# Patient Record
Sex: Male | Born: 1955 | Race: White | Hispanic: No | Marital: Married | Smoking: Former smoker
Health system: Southern US, Community
[De-identification: ages and names within clinical notes are randomized; demographics above are authoritative.]

## PROBLEM LIST (undated history)

## (undated) DIAGNOSIS — F17200 Nicotine dependence, unspecified, uncomplicated: Secondary | ICD-10-CM

## (undated) DIAGNOSIS — Z860100 Personal history of colon polyps, unspecified: Secondary | ICD-10-CM

## (undated) DIAGNOSIS — R252 Cramp and spasm: Secondary | ICD-10-CM

## (undated) DIAGNOSIS — Z6832 Body mass index (BMI) 32.0-32.9, adult: Secondary | ICD-10-CM

## (undated) DIAGNOSIS — I129 Hypertensive chronic kidney disease with stage 1 through stage 4 chronic kidney disease, or unspecified chronic kidney disease: Secondary | ICD-10-CM

## (undated) DIAGNOSIS — I700 Atherosclerosis of aorta: Secondary | ICD-10-CM

## (undated) DIAGNOSIS — Z8709 Personal history of other diseases of the respiratory system: Secondary | ICD-10-CM

## (undated) DIAGNOSIS — M5416 Radiculopathy, lumbar region: Secondary | ICD-10-CM

## (undated) DIAGNOSIS — M51369 Other intervertebral disc degeneration, lumbar region without mention of lumbar back pain or lower extremity pain: Secondary | ICD-10-CM

## (undated) DIAGNOSIS — F419 Anxiety disorder, unspecified: Secondary | ICD-10-CM

## (undated) DIAGNOSIS — M5431 Sciatica, right side: Secondary | ICD-10-CM

## (undated) DIAGNOSIS — G8929 Other chronic pain: Secondary | ICD-10-CM

## (undated) DIAGNOSIS — J189 Pneumonia, unspecified organism: Secondary | ICD-10-CM

## (undated) DIAGNOSIS — G4700 Insomnia, unspecified: Secondary | ICD-10-CM

## (undated) DIAGNOSIS — R7303 Prediabetes: Secondary | ICD-10-CM

## (undated) DIAGNOSIS — K219 Gastro-esophageal reflux disease without esophagitis: Secondary | ICD-10-CM

## (undated) DIAGNOSIS — E7800 Pure hypercholesterolemia, unspecified: Secondary | ICD-10-CM

## (undated) DIAGNOSIS — D582 Other hemoglobinopathies: Secondary | ICD-10-CM

## (undated) DIAGNOSIS — E875 Hyperkalemia: Secondary | ICD-10-CM

## (undated) DIAGNOSIS — M549 Dorsalgia, unspecified: Secondary | ICD-10-CM

## (undated) DIAGNOSIS — R519 Headache, unspecified: Secondary | ICD-10-CM

## (undated) DIAGNOSIS — M542 Cervicalgia: Secondary | ICD-10-CM

## (undated) DIAGNOSIS — A4902 Methicillin resistant Staphylococcus aureus infection, unspecified site: Secondary | ICD-10-CM

## (undated) DIAGNOSIS — G5793 Unspecified mononeuropathy of bilateral lower limbs: Secondary | ICD-10-CM

## (undated) DIAGNOSIS — M5136 Other intervertebral disc degeneration, lumbar region: Secondary | ICD-10-CM

## (undated) DIAGNOSIS — D649 Anemia, unspecified: Secondary | ICD-10-CM

## (undated) DIAGNOSIS — M797 Fibromyalgia: Secondary | ICD-10-CM

## (undated) DIAGNOSIS — K5790 Diverticulosis of intestine, part unspecified, without perforation or abscess without bleeding: Secondary | ICD-10-CM

## (undated) DIAGNOSIS — J309 Allergic rhinitis, unspecified: Secondary | ICD-10-CM

## (undated) DIAGNOSIS — R200 Anesthesia of skin: Secondary | ICD-10-CM

## (undated) DIAGNOSIS — C439 Malignant melanoma of skin, unspecified: Secondary | ICD-10-CM

## (undated) DIAGNOSIS — G4730 Sleep apnea, unspecified: Secondary | ICD-10-CM

## (undated) DIAGNOSIS — I7300 Raynaud's syndrome without gangrene: Secondary | ICD-10-CM

## (undated) DIAGNOSIS — M5030 Other cervical disc degeneration, unspecified cervical region: Secondary | ICD-10-CM

## (undated) DIAGNOSIS — D751 Secondary polycythemia: Secondary | ICD-10-CM

## (undated) DIAGNOSIS — I10 Essential (primary) hypertension: Secondary | ICD-10-CM

## (undated) DIAGNOSIS — U071 COVID-19: Secondary | ICD-10-CM

## (undated) DIAGNOSIS — I739 Peripheral vascular disease, unspecified: Secondary | ICD-10-CM

## (undated) DIAGNOSIS — M545 Low back pain, unspecified: Secondary | ICD-10-CM

## (undated) DIAGNOSIS — M1990 Unspecified osteoarthritis, unspecified site: Secondary | ICD-10-CM

## (undated) DIAGNOSIS — J329 Chronic sinusitis, unspecified: Secondary | ICD-10-CM

## (undated) DIAGNOSIS — M109 Gout, unspecified: Secondary | ICD-10-CM

## (undated) DIAGNOSIS — R7989 Other specified abnormal findings of blood chemistry: Secondary | ICD-10-CM

## (undated) HISTORY — PX: APPENDECTOMY: SHX54

## (undated) HISTORY — PX: HAND TENDON SURGERY: SHX663

## (undated) HISTORY — DX: Atherosclerosis of aorta: I70.0

## (undated) HISTORY — DX: Hyperkalemia: E87.5

## (undated) HISTORY — DX: Cervicalgia: M54.2

## (undated) HISTORY — PX: BACK SURGERY: SHX140

## (undated) HISTORY — PX: EYE SURGERY: SHX253

## (undated) HISTORY — DX: Sciatica, right side: M54.31

## (undated) HISTORY — PX: CARPAL TUNNEL RELEASE: SHX101

## (undated) HISTORY — PX: UPPER GI ENDOSCOPY: SHX6162

## (undated) HISTORY — DX: Low back pain, unspecified: M54.50

## (undated) HISTORY — DX: Nicotine dependence, unspecified, uncomplicated: F17.200

## (undated) HISTORY — DX: Allergic rhinitis, unspecified: J30.9

## (undated) HISTORY — DX: Body mass index (BMI) 32.0-32.9, adult: Z68.32

## (undated) HISTORY — PX: ANTERIOR CERVICAL DECOMP/DISCECTOMY FUSION: SHX1161

## (undated) HISTORY — DX: Raynaud's syndrome without gangrene: I73.00

## (undated) HISTORY — PX: OTHER SURGICAL HISTORY: SHX169

## (undated) HISTORY — PX: TONSILLECTOMY: SUR1361

## (undated) HISTORY — DX: Insomnia, unspecified: G47.00

## (undated) HISTORY — DX: Other hemoglobinopathies: D58.2

## (undated) HISTORY — DX: Other specified abnormal findings of blood chemistry: R79.89

## (undated) HISTORY — DX: Hypertensive chronic kidney disease with stage 1 through stage 4 chronic kidney disease, or unspecified chronic kidney disease: I12.9

## (undated) HISTORY — DX: Chronic sinusitis, unspecified: J32.9

## (undated) HISTORY — PX: CATARACT EXTRACTION, BILATERAL: SHX1313

## (undated) HISTORY — DX: COVID-19: U07.1

## (undated) HISTORY — PX: COLONOSCOPY: SHX174

## (undated) HISTORY — PX: SHOULDER ARTHROSCOPY: SHX128

## (undated) HISTORY — PX: INGUINAL HERNIA REPAIR: SUR1180

## (undated) HISTORY — DX: Personal history of colon polyps, unspecified: Z86.0100

## (undated) HISTORY — DX: Secondary polycythemia: D75.1

## (undated) HISTORY — PX: WISDOM TOOTH EXTRACTION: SHX21

## (undated) HISTORY — PX: KNEE ARTHROSCOPY: SHX127

## (undated) HISTORY — PX: JOINT REPLACEMENT: SHX530

## (undated) HISTORY — DX: Personal history of colonic polyps: Z86.010

## (undated) HISTORY — DX: Diverticulosis of intestine, part unspecified, without perforation or abscess without bleeding: K57.90

---
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
## 1986-02-27 HISTORY — PX: VASECTOMY: SHX75

## 1988-10-28 HISTORY — PX: KNEE ARTHROSCOPY: SHX127

## 1988-10-28 HISTORY — PX: CARPAL TUNNEL RELEASE: SHX101

## 1995-11-28 HISTORY — PX: POSTERIOR LUMBAR FUSION: SHX6036

## 1997-06-05 ENCOUNTER — Inpatient Hospital Stay (HOSPITAL_COMMUNITY): Admission: RE | Admit: 1997-06-05 | Discharge: 1997-06-05 | Payer: Self-pay | Admitting: Neurosurgery

## 1997-07-30 ENCOUNTER — Ambulatory Visit (HOSPITAL_COMMUNITY): Admission: RE | Admit: 1997-07-30 | Discharge: 1997-07-30 | Payer: Self-pay | Admitting: Neurosurgery

## 1998-06-30 ENCOUNTER — Ambulatory Visit (HOSPITAL_COMMUNITY): Admission: RE | Admit: 1998-06-30 | Discharge: 1998-06-30 | Payer: Self-pay | Admitting: Neurosurgery

## 1998-06-30 ENCOUNTER — Encounter: Payer: Self-pay | Admitting: Neurosurgery

## 1998-08-23 ENCOUNTER — Ambulatory Visit (HOSPITAL_COMMUNITY): Admission: RE | Admit: 1998-08-23 | Discharge: 1998-08-23 | Payer: Self-pay | Admitting: Neurosurgery

## 1998-08-23 ENCOUNTER — Encounter: Payer: Self-pay | Admitting: Neurosurgery

## 1998-10-29 HISTORY — PX: WISDOM TOOTH EXTRACTION: SHX21

## 1998-12-09 ENCOUNTER — Encounter: Payer: Self-pay | Admitting: Neurosurgery

## 1998-12-15 ENCOUNTER — Ambulatory Visit (HOSPITAL_COMMUNITY): Admission: RE | Admit: 1998-12-15 | Discharge: 1998-12-15 | Payer: Self-pay | Admitting: Neurosurgery

## 1998-12-15 ENCOUNTER — Encounter: Payer: Self-pay | Admitting: Neurosurgery

## 1999-02-09 ENCOUNTER — Ambulatory Visit (HOSPITAL_COMMUNITY): Admission: RE | Admit: 1999-02-09 | Discharge: 1999-02-09 | Payer: Self-pay | Admitting: Neurosurgery

## 1999-02-09 ENCOUNTER — Encounter: Payer: Self-pay | Admitting: Neurosurgery

## 1999-06-17 ENCOUNTER — Encounter: Payer: Self-pay | Admitting: Neurosurgery

## 1999-06-17 ENCOUNTER — Ambulatory Visit (HOSPITAL_COMMUNITY): Admission: RE | Admit: 1999-06-17 | Discharge: 1999-06-17 | Payer: Self-pay | Admitting: Neurosurgery

## 1999-06-28 ENCOUNTER — Encounter: Payer: Self-pay | Admitting: Neurosurgery

## 1999-06-28 ENCOUNTER — Ambulatory Visit (HOSPITAL_COMMUNITY): Admission: RE | Admit: 1999-06-28 | Discharge: 1999-06-28 | Payer: Self-pay | Admitting: Neurosurgery

## 1999-07-22 ENCOUNTER — Encounter: Admission: RE | Admit: 1999-07-22 | Discharge: 1999-09-12 | Payer: Self-pay | Admitting: Anesthesiology

## 1999-10-26 ENCOUNTER — Encounter: Admission: RE | Admit: 1999-10-26 | Discharge: 2000-01-24 | Payer: Self-pay | Admitting: Anesthesiology

## 1999-11-03 ENCOUNTER — Encounter: Admission: RE | Admit: 1999-11-03 | Discharge: 1999-11-04 | Payer: Self-pay | Admitting: Anesthesiology

## 2000-01-27 ENCOUNTER — Encounter: Admission: RE | Admit: 2000-01-27 | Discharge: 2000-04-26 | Payer: Self-pay | Admitting: Anesthesiology

## 2000-06-22 ENCOUNTER — Encounter: Admission: RE | Admit: 2000-06-22 | Discharge: 2000-09-20 | Payer: Self-pay | Admitting: Anesthesiology

## 2000-10-12 ENCOUNTER — Encounter: Admission: RE | Admit: 2000-10-12 | Discharge: 2000-10-27 | Payer: Self-pay | Admitting: Anesthesiology

## 2000-10-16 ENCOUNTER — Ambulatory Visit (HOSPITAL_COMMUNITY): Admission: RE | Admit: 2000-10-16 | Discharge: 2000-10-16 | Payer: Self-pay | Admitting: Neurosurgery

## 2000-10-16 ENCOUNTER — Encounter: Payer: Self-pay | Admitting: Neurosurgery

## 2001-07-30 ENCOUNTER — Encounter: Admission: RE | Admit: 2001-07-30 | Discharge: 2001-10-28 | Payer: Self-pay

## 2001-07-31 ENCOUNTER — Encounter: Payer: Self-pay | Admitting: Family Medicine

## 2001-07-31 ENCOUNTER — Ambulatory Visit (HOSPITAL_COMMUNITY): Admission: RE | Admit: 2001-07-31 | Discharge: 2001-07-31 | Payer: Self-pay | Admitting: Family Medicine

## 2001-12-06 ENCOUNTER — Encounter: Payer: Self-pay | Admitting: Neurosurgery

## 2001-12-11 ENCOUNTER — Inpatient Hospital Stay (HOSPITAL_COMMUNITY): Admission: RE | Admit: 2001-12-11 | Discharge: 2001-12-12 | Payer: Self-pay | Admitting: Neurosurgery

## 2001-12-11 ENCOUNTER — Encounter: Payer: Self-pay | Admitting: Neurosurgery

## 2002-02-05 ENCOUNTER — Encounter: Payer: Self-pay | Admitting: Neurosurgery

## 2002-02-05 ENCOUNTER — Encounter: Admission: RE | Admit: 2002-02-05 | Discharge: 2002-02-05 | Payer: Self-pay | Admitting: Neurosurgery

## 2002-04-30 ENCOUNTER — Encounter: Payer: Self-pay | Admitting: Neurosurgery

## 2002-04-30 ENCOUNTER — Encounter: Admission: RE | Admit: 2002-04-30 | Discharge: 2002-04-30 | Payer: Self-pay | Admitting: Neurosurgery

## 2002-06-04 ENCOUNTER — Ambulatory Visit (HOSPITAL_COMMUNITY): Admission: RE | Admit: 2002-06-04 | Discharge: 2002-06-04 | Payer: Self-pay | Admitting: Family Medicine

## 2002-06-04 ENCOUNTER — Encounter: Payer: Self-pay | Admitting: Family Medicine

## 2002-08-21 ENCOUNTER — Encounter: Payer: Self-pay | Admitting: Neurosurgery

## 2002-08-21 ENCOUNTER — Encounter: Admission: RE | Admit: 2002-08-21 | Discharge: 2002-08-21 | Payer: Self-pay | Admitting: Neurosurgery

## 2002-11-26 ENCOUNTER — Ambulatory Visit (HOSPITAL_COMMUNITY): Admission: RE | Admit: 2002-11-26 | Discharge: 2002-11-26 | Payer: Self-pay | Admitting: Family Medicine

## 2002-11-26 ENCOUNTER — Encounter: Payer: Self-pay | Admitting: Family Medicine

## 2003-01-01 ENCOUNTER — Encounter: Admission: RE | Admit: 2003-01-01 | Discharge: 2003-01-01 | Payer: Self-pay | Admitting: Neurosurgery

## 2003-01-12 ENCOUNTER — Encounter: Admission: RE | Admit: 2003-01-12 | Discharge: 2003-01-12 | Payer: Self-pay | Admitting: Neurosurgery

## 2003-01-26 ENCOUNTER — Encounter
Admission: RE | Admit: 2003-01-26 | Discharge: 2003-04-26 | Payer: Self-pay | Admitting: Physical Medicine and Rehabilitation

## 2003-05-08 ENCOUNTER — Encounter: Admission: RE | Admit: 2003-05-08 | Discharge: 2003-08-06 | Payer: Self-pay | Admitting: Neurosurgery

## 2003-07-08 ENCOUNTER — Encounter: Admission: RE | Admit: 2003-07-08 | Discharge: 2003-07-08 | Payer: Self-pay | Admitting: Neurosurgery

## 2004-01-08 ENCOUNTER — Encounter: Admission: RE | Admit: 2004-01-08 | Discharge: 2004-01-08 | Payer: Self-pay | Admitting: Neurosurgery

## 2004-01-12 ENCOUNTER — Ambulatory Visit (HOSPITAL_COMMUNITY): Admission: RE | Admit: 2004-01-12 | Discharge: 2004-01-12 | Payer: Self-pay | Admitting: Orthopedic Surgery

## 2004-01-27 ENCOUNTER — Encounter: Admission: RE | Admit: 2004-01-27 | Discharge: 2004-01-27 | Payer: Self-pay | Admitting: Nephrology

## 2008-07-17 ENCOUNTER — Ambulatory Visit (HOSPITAL_BASED_OUTPATIENT_CLINIC_OR_DEPARTMENT_OTHER): Admission: RE | Admit: 2008-07-17 | Discharge: 2008-07-17 | Payer: Self-pay | Admitting: Orthopedic Surgery

## 2008-12-30 ENCOUNTER — Emergency Department (HOSPITAL_COMMUNITY): Admission: EM | Admit: 2008-12-30 | Discharge: 2008-12-30 | Payer: Self-pay | Admitting: Family Medicine

## 2008-12-31 ENCOUNTER — Inpatient Hospital Stay (HOSPITAL_COMMUNITY): Admission: EM | Admit: 2008-12-31 | Discharge: 2009-01-04 | Payer: Self-pay | Admitting: Emergency Medicine

## 2008-12-31 ENCOUNTER — Encounter (INDEPENDENT_AMBULATORY_CARE_PROVIDER_SITE_OTHER): Payer: Self-pay | Admitting: General Surgery

## 2009-01-27 ENCOUNTER — Encounter: Admission: RE | Admit: 2009-01-27 | Discharge: 2009-01-27 | Payer: Self-pay | Admitting: General Surgery

## 2010-06-01 LAB — CBC
HCT: 34.9 % — ABNORMAL LOW (ref 39.0–52.0)
HCT: 37.2 % — ABNORMAL LOW (ref 39.0–52.0)
HCT: 37.5 % — ABNORMAL LOW (ref 39.0–52.0)
HCT: 46.5 % (ref 39.0–52.0)
Hemoglobin: 12.2 g/dL — ABNORMAL LOW (ref 13.0–17.0)
Hemoglobin: 12.6 g/dL — ABNORMAL LOW (ref 13.0–17.0)
Hemoglobin: 13 g/dL (ref 13.0–17.0)
Hemoglobin: 16.2 g/dL (ref 13.0–17.0)
MCHC: 34 g/dL (ref 30.0–36.0)
MCHC: 34.6 g/dL (ref 30.0–36.0)
MCHC: 34.8 g/dL (ref 30.0–36.0)
MCHC: 34.8 g/dL (ref 30.0–36.0)
MCV: 93.7 fL (ref 78.0–100.0)
MCV: 94.8 fL (ref 78.0–100.0)
MCV: 95.4 fL (ref 78.0–100.0)
MCV: 95.6 fL (ref 78.0–100.0)
Platelets: 163 10*3/uL (ref 150–400)
Platelets: 163 10*3/uL (ref 150–400)
Platelets: 195 10*3/uL (ref 150–400)
Platelets: 205 10*3/uL (ref 150–400)
RBC: 3.65 MIL/uL — ABNORMAL LOW (ref 4.22–5.81)
RBC: 3.89 MIL/uL — ABNORMAL LOW (ref 4.22–5.81)
RBC: 3.96 MIL/uL — ABNORMAL LOW (ref 4.22–5.81)
RBC: 4.96 MIL/uL (ref 4.22–5.81)
RDW: 12.7 % (ref 11.5–15.5)
RDW: 12.8 % (ref 11.5–15.5)
RDW: 12.8 % (ref 11.5–15.5)
RDW: 13 % (ref 11.5–15.5)
WBC: 14.7 10*3/uL — ABNORMAL HIGH (ref 4.0–10.5)
WBC: 7.7 10*3/uL (ref 4.0–10.5)
WBC: 9.1 10*3/uL (ref 4.0–10.5)
WBC: 9.6 10*3/uL (ref 4.0–10.5)

## 2010-06-01 LAB — URINALYSIS, ROUTINE W REFLEX MICROSCOPIC
Bilirubin Urine: NEGATIVE
Glucose, UA: NEGATIVE mg/dL
Hgb urine dipstick: NEGATIVE
Ketones, ur: NEGATIVE mg/dL
Nitrite: NEGATIVE
Protein, ur: NEGATIVE mg/dL
Specific Gravity, Urine: 1.013 (ref 1.005–1.030)
Urobilinogen, UA: 0.2 mg/dL (ref 0.0–1.0)
pH: 7 (ref 5.0–8.0)

## 2010-06-01 LAB — BASIC METABOLIC PANEL
BUN: 7 mg/dL (ref 6–23)
CO2: 29 mEq/L (ref 19–32)
Calcium: 9.1 mg/dL (ref 8.4–10.5)
Chloride: 105 mEq/L (ref 96–112)
Creatinine, Ser: 1.28 mg/dL (ref 0.4–1.5)
GFR calc Af Amer: >60 mL/min (ref >60)
GFR calc non Af Amer: 59 mL/min — ABNORMAL LOW (ref >60)
Glucose, Bld: 143 mg/dL — ABNORMAL HIGH (ref 70–99)
Potassium: 4.3 mEq/L (ref 3.5–5.1)
Sodium: 142 mEq/L (ref 135–145)

## 2010-06-01 LAB — COMPREHENSIVE METABOLIC PANEL
ALT: 21 U/L (ref 0–53)
AST: 31 U/L (ref 0–37)
Albumin: 4.3 g/dL (ref 3.5–5.2)
Alkaline Phosphatase: 37 U/L — ABNORMAL LOW (ref 39–117)
BUN: 14 mg/dL (ref 6–23)
CO2: 23 mEq/L (ref 19–32)
Calcium: 9.1 mg/dL (ref 8.4–10.5)
Chloride: 100 mEq/L (ref 96–112)
Creatinine, Ser: 1.22 mg/dL (ref 0.4–1.5)
GFR calc Af Amer: >60 mL/min (ref >60)
GFR calc non Af Amer: >60 mL/min (ref >60)
Glucose, Bld: 118 mg/dL — ABNORMAL HIGH (ref 70–99)
Potassium: 3.9 mEq/L (ref 3.5–5.1)
Sodium: 134 mEq/L — ABNORMAL LOW (ref 135–145)
Total Bilirubin: 1.2 mg/dL (ref 0.3–1.2)
Total Protein: 7.8 g/dL (ref 6.0–8.3)

## 2010-06-01 LAB — DIFFERENTIAL
Basophils Absolute: 0 10*3/uL (ref 0.0–0.1)
Basophils Absolute: 0 10*3/uL (ref 0.0–0.1)
Basophils Relative: 0 % (ref 0–1)
Basophils Relative: 1 % (ref 0–1)
Eosinophils Absolute: 0 10*3/uL (ref 0.0–0.7)
Eosinophils Absolute: 0.1 10*3/uL (ref 0.0–0.7)
Eosinophils Relative: 0 % (ref 0–5)
Eosinophils Relative: 1 % (ref 0–5)
Lymphocytes Relative: 18 % (ref 12–46)
Lymphocytes Relative: 9 % — ABNORMAL LOW (ref 12–46)
Lymphs Abs: 1.3 10*3/uL (ref 0.7–4.0)
Lymphs Abs: 1.7 10*3/uL (ref 0.7–4.0)
Monocytes Absolute: 0.7 10*3/uL (ref 0.1–1.0)
Monocytes Absolute: 0.7 10*3/uL (ref 0.1–1.0)
Monocytes Relative: 5 % (ref 3–12)
Monocytes Relative: 8 % (ref 3–12)
Neutro Abs: 12.7 10*3/uL — ABNORMAL HIGH (ref 1.7–7.7)
Neutro Abs: 7 10*3/uL (ref 1.7–7.7)
Neutrophils Relative %: 73 % (ref 43–77)
Neutrophils Relative %: 86 % — ABNORMAL HIGH (ref 43–77)

## 2010-06-01 LAB — URINE CULTURE
Colony Count: NO GROWTH
Culture: NO GROWTH

## 2010-06-01 LAB — GENTAMICIN LEVEL, RANDOM: Gentamicin Rm: 7.4 ug/mL

## 2010-06-01 LAB — LIPASE, BLOOD: Lipase: 25 U/L (ref 11–59)

## 2010-06-01 LAB — HEMOCCULT GUIAC POC 1CARD (OFFICE): Fecal Occult Bld: NEGATIVE

## 2010-06-07 LAB — BASIC METABOLIC PANEL
BUN: 15 mg/dL (ref 6–23)
CO2: 30 mEq/L (ref 19–32)
Calcium: 9.5 mg/dL (ref 8.4–10.5)
Chloride: 103 mEq/L (ref 96–112)
Creatinine, Ser: 0.95 mg/dL (ref 0.4–1.5)
GFR calc Af Amer: >60 mL/min (ref >60)
GFR calc non Af Amer: >60 mL/min (ref >60)
Glucose, Bld: 181 mg/dL — ABNORMAL HIGH (ref 70–99)
Potassium: 5.1 mEq/L (ref 3.5–5.1)
Sodium: 141 mEq/L (ref 135–145)

## 2010-06-07 LAB — POCT HEMOGLOBIN-HEMACUE: Hemoglobin: 15.3 g/dL (ref 13.0–17.0)

## 2010-07-12 NOTE — Op Note (Signed)
Victor [name], Victor [name]              ACCOUNT NO.:  [id number]   MEDICAL RECORD NO.:  [id number]          PATIENT TYPE:  AMB   LOCATION:  DSC                          FACILITY:  [facility]   PHYSICIAN:  Victor [name], M.D. DATE OF BIRTH:  12/21/1955   DATE OF PROCEDURE:  07/17/2008  DATE OF DISCHARGE:                               OPERATIVE REPORT   PREOPERATIVE DIAGNOSIS:  Entrapment neuropathy, median nerve right  carpal tunnel.   POSTOPERATIVE DIAGNOSIS:  Entrapment neuropathy, median nerve right  carpal tunnel.   OPERATION:  Release of right transcarpal ligament.   OPERATING SURGEON:  Victor Fitch. Sypher, MD   ASSISTANT:  Victor Reeks Dasnoit, PA-C   ANESTHESIA:  General by LMA.   SUPERVISING ANESTHESIOLOGIST:  Victor Mayo, MD   INDICATIONS:  Victor [name] is an unfortunate 55 year old gentleman  referred through the courtesy of Dr. Tally [name] of [facility] for  evaluation of painful right carpal tunnel syndrome.   Clinical examination revealed definite right carpal tunnel syndrome and  possible residual left carpal tunnel syndrome following prior release in  1999 on the left.  Electrodiagnostic studies confirmed significant right  median neuropathy at the wrist level and residual abnormalities on the  left.   Due to a failed respond to nonoperative measures, Victor [name] was brought  to the operating room at this time for release of his right transcarpal  ligament.   Victor [name] has a very severe chronic pain problem due to a spinal  arthritis and a failure to have relief with surgery.  He is on chronic  high-dose narcotic analgesics and also reports having fibromyalgia.   Preoperatively, he was advised of potential risks and benefits of  surgery.  I suspect we will be challenged in managing his perioperative  pain due to habituation to narcotics.  We intend to thoroughly  infiltrate his wound with postoperative local anesthetic to try to  minimize his pain  experience.  Questions were invited and answered in  detail the holding area.   PROCEDURE:  Victor [name] is brought to the operating room and  placed in supine position on the operating table.   Following the induction of general anesthesia by LMA technique under Dr.  Jarrett [name] direct supervision, his right arm was prepped with Betadine  soap solution and sterilely draped.  A pneumatic tourniquet was applied  to the proximal right brachium.  Followed exsanguination of his right  arm with an Esmarch bandage, the arterial tourniquet was inflated to 230  mmHg.   Procedure commenced with a short incision in the line of the ring finger  in the palm.  The patient's were carefully divided the palmar fascia.  The subcutaneous tissues were carefully divided revealing palmar fascia.  This was split longitudinally to reveal common sensory branch of the  median nerve.  These were followed back to the transverse carpal  ligament which was gently isolated from the median nerve with a Dealer.  Bleeding points along the transverse carpal ligament were  electrocauterized preemptively with bipolar forceps followed by release  of the transverse carpal ligament  and volar forearm fascia with scissors  extending 4 cm into the distal forearm.   A Victor [name] was used to visualize the contents of the  carpal canal and distal forearm.  The tenosynovium was quite fibrotic.  No masses or other predicaments were noted.   The wound was then inspect for bleeding points and repaired with  intradermal 3-0 Prolene.  A compressive dressing was applied with a  volar plaster splint maintaining the wrist in 5 degrees of dorsiflexion.  For aftercare, Victor [name] was provided advices to pain management.  He  does have OxyContin and other very strong analgesics from his pain  management doctor.  We are going to suggest that he use his OxyContin  and Tylox and we will provide  supplemental oxycodone for his use.      Victor Fitch [name], M.D.  Electronically Signed     RVS/MEDQ  D:  07/17/2008  T:  07/17/2008  Job:  [id number]   cc:   Victor [name], M.D.

## 2010-07-15 NOTE — Procedures (Signed)
NAME:  Victor, [name]                        ACCOUNT NO.:  [id number]   MEDICAL RECORD NO.:  [id number]                   PATIENT TYPE:  REC   LOCATION:  TPC                                  FACILITY:  [facility]   PHYSICIAN:  Celene Kras, MD                     DATE OF BIRTH:  10-16-1955   DATE OF PROCEDURE:  04/07/2003  DATE OF DISCHARGE:                                 OPERATIVE REPORT   [facility].  I evaluated,  reviewed the health and history form and 14-point review of systems,  reviewed available imaging, Dr. Pamelia Hoit is assisting me and accompanies  me, historical features are reviewed.  I personally examined the patient.   We planned today's cervical facetal injection, most problematic side, that  being the left side, consider the contralateral side if needed at a later  date.  I do believe that he is probably taking biomechanical stress above  and below the surgical fixation site, but really describes most problematic  pain at C4, 5 and 6.  Contributory innervation will be addressed.  We will  plan a cervical facet medial branch injection and assess within the context  of activities of daily living.   OBJECTIVE:  Diffuse paracervical myofascial discomfort with positive  cervical facetal compression test, left greater than right.  He has  suprascapular, levator scapular amplification.  He has no other over  neurological deficit, motor, sensory or reflexes.   IMPRESSION:  Cervicalgia, degenerative spine disease cervical spine.  Cervical facet syndrome.   PLAN:  Cervical facet medial branch injection at C4, 5, and 6, contributory  innervation addressed, independent needle access points.  He has consented.   The patient taken to fluoroscopy suite and placed in supine position after  prep and drape in usual fashion.  Under local anesthetic, I advanced the  cervical facet medial branch with a 25-gauge needle, Dr. Pamelia Hoit  assists,  to the facet medial branch without CSF, heme, or paresthesia.  Test block  uneventfully.  Multiple fluoroscopic positions were visualized.  We then  inject 0.5 mL of lidocaine 1% NPF, and 5 mg of Aristocort to each level.   The patient tolerated the procedure well with no complications from our  procedure.  Will assess him in context of activities of daily living.  Will  see him in follow-up.                                                Celene Kras, MD    HH/MEDQ  D:  04/07/2003 12:02:26  T:  04/07/2003 16:10:96  Job:  [id number]
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
## 2010-07-15 NOTE — Procedures (Signed)
NAME:  Victor [name], Victor [name]                        ACCOUNT NO.:  [id number]   MEDICAL RECORD NO.:  [id number]                   PATIENT TYPE:   LOCATION:                                       FACILITY:  [facility]   PHYSICIAN:  Celene Kras, MD                     DATE OF BIRTH:  [birth year]   DATE OF PROCEDURE:  05/12/2003  DATE OF DISCHARGE:                                 OPERATIVE REPORT   HISTORY:  The patient comes in today regarding pain management.  I have  evaluated him via the health and history form, with a 14-point review of  systems.  The patient comes in today complaining of paracervical discomfort, but did  have a relief cycle with the facet injection.  His pain is primarily  bilaterally today.  He has been re-evaluated by Dr. Kathaleen Maser. Pool, and  apparently a bone stimulator has been ordered.  His pain is limiting his range of motion and functional indices, restorative  sleep capacity, and overall quality of life.  It is reasonable to inject  bilaterally at this visit, as prior to moving to radiofrequency neural  ablation, we need to make sure that we have a clear positive provocative  block.  I have discussed this with him.  His pain is referred in his  suprascapular, levator scapular component consistent with C4, C5, C6 and  possibly C7.  We will inject C4, C5 and C6, contributory innervation  addressed.  He will assess this in the context of activities of daily living and will  report back to [place] and I will plan another block.  I would like to see how he  does.  Review of medications is appropriate.   PHYSICAL EXAMINATION:  NEUROLOGIC/MUSCULOSKELETAL:  Objectively diffuse  paracervical myofascial discomfort with positive cervical facet compression  test, right greater than left.  Suprascapular and levator scapular pain.  Actually the left side seems to be somewhat improved.  He has no other overt  neurological deficits, motor, sensory, or reflexes.   IMPRESSION:  1.  Cervicitis.  2. 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  3. Cervical facet syndrome.   PLAN:  Cervical facet injection at C4, C5, C6 and possibly C7, right and  left side, independent needle access points.  He has consented.   DESCRIPTION OF PROCEDURE:  The patient is taken to the fluoroscopy suite and  placed in the supine position.  The neck is prepped and draped in the usual  fashion.  Using a #25 gauge needle, I advanced to the cervical facet, medial  branch, at C4, C5 and C6, withhold C7, right and left side, independent  needle access points, contributory innervation addressed.  I then injected  0.5 mL of lidocaine 1% and MPF at each level, with a total of 40 mg of  Aristocort in divided dose.  He tolerated this procedure well.  Will assess  within the context of activities of daily living.  Some modest  improvement in range of motion.  Discharge instructions were given.                                                Celene Kras, MD    HH/MEDQ  D:  05/12/2003 11:50:08  T:  05/12/2003 12:47:06  Job:  [id number]   cc:   [name], M.D.  [address]  [place]  [place] [address]  Fax: [phone]

## 2010-07-15 NOTE — H&P (Signed)
[facility]  Patient:    Victor [name], Victor [name]                     MRN: [id number] Adm. Date:  [id number] Attending:  Thyra Breed CC:         Meredith Staggers, M.D.   History and Physical  FOLLOWUP EVALUATION:  Victor [name] comes in for followup evaluation of his chronic neck and shoulder pain.  Since his last evaluation, he noted that his injection was not very helpful.  He missed his last appointment and had to be rescheduled.  In the interim, he has noted that he has ongoing neck discomfort with radiation out into the right upper extremity worse than into the left with right biceps pain.  He notes that he has Victor [name], shooting pains into his interscapular region over and the anterior chest wall area.  He does not feel as though the OxyContin is holding the pain down as well but does not want to go up on the dose of this too much because he says that it decreases his libido.  He does continue to take the Vicodin up to eight tablets per day and continues with the Zanaflex.  He did start back in with some amitriptyline 10 mg at night but has noted some difficulties with urinary hesitancy since starting back with this.  Current medications are listed as Zanaflex, OxyContin, colchicine, Tenormin and Elavil.  EXAMINATION:  VITAL SIGNS:  Blood pressure was 181/91.  Heart rate is 98.  Respiratory rate 16.  O2 saturation is 99%.  Pain level is 7.5/10.  NEUROLOGIC:  Range of motion of his neck continues to be moderately restricted.  Motor was symmetric in the upper extremities with symmetric deep tendon reflexes.  EXTREMITIES:  He has pain on range of motion of his right shoulder, especially with internal and external rotation; he has crepitus also.  IMPRESSION: 1. Neck discomfort on the basis of cervical degenerative disk disease and    myofascial pain. 2. Shoulder pain with some elements of degenerative changes both in the    tendons and in the  joint.  DISPOSITION: 1. I offered to go ahead and increase his dose of OxyContin but the patient    wishes to hold off for the time-being.  He wants to continue on OxyContin    40 mg twice a day, #60 with no refill. 2. Continue with Vicodin 5/500 mg 1 to 2 p.o. q.6h. p.r.n. breakthrough pain,    #100. 3. Zanaflex 4 mg 1 in the morning, 2 at midday and 2 in the evening, #150 with    6 refills. 4. Follow up with me in four weeks.  I advised the patient if he was not    improved in four weeks, we would likely have to go up on his opiates. DD:  10/12/00 TD:  10/13/00 Job: [id number]
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
## 2010-07-15 NOTE — Op Note (Signed)
NAME:  Victor [name], Victor [name]                        ACCOUNT NO.:  [id number]   MEDICAL RECORD NO.:  [id number]                   PATIENT TYPE:  INP   LOCATION:  3007                                 FACILITY:  [facility]   PHYSICIAN:  Kathaleen Maser. Pool, M.D.                 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   PROCEDURE:  C3-4 anterior fusion with allograft and anterior plating.  Exploration of C4-C6 anterior fusion with removal of anterior plate  instrumentation.   SURGEON:  Kathaleen Maser. Pool, M.D.   ASSISTANT:  Donalee Citrin, M.D.   ANESTHESIA:  General endotracheal.   INDICATIONS FOR PROCEDURE:  This patient is a 55 year old male with history  of neck and bilateral upper extremity pain, consistent with cervical  instability and some degree of myelopathy.  MRI scanning demonstrates a  central disk herniation at C3-4 with mild spinal cord compression and  evidence of dynamic instability on bending films of the cervical spine at  the C3-4 level.  The patient is status post previous C4-C6 anterior fusion  with instrumentation.  The patient has failed conservative management.  We  decided to proceed with anterior C3-4 anterior cervicectomy and fusion with  allograft anterior plating, as well as removal of anterior instrumentation  from C4-C6 and exploration of this previous fusion.   PROCEDURE IN DETAIL:  The patient was brought to the operating room and  placed on the table in a supine position.  After an adequate level of  anesthesia had been achieved with his neck slightly extended, his head was  placed with halter traction. The patient's  anterior cervical region was  prepped and draped sterilely.  A 10 blade was used to make a linear skin  incision overlying the C4 level.  This was carried down sharply to the  platysma.  The platysma was then divided vertically, and dissection  proceeded along the medial border of the sternocleidomastoid muscle and  carotid sheath.  The trachea and esophagus were mobilized and retracted  towards the left.  The paravertebral fascia was stripped off the anterior  spinal column.  The longus colli muscles were then elevated bilaterally.  The patient's previous anterior plate instrumentation was identified.  The  plate and the screws were dissected free using electrocautery and blunt  dissection.  An Atlantis plate was then removed by loosening the locking  screws and removing the screws.  All screws and the plate were removed.  The  fusion from C4 to C6 was obviously quite solid.  There was no evidence of  pseudarthrosis. Attention was then placed  to C3-4.  The disk itself was  degenerated and obviously unstable.  The self-retractor system was placed.  The disk was incised with a 15 blade, and a retractor placed to widen the  disk space. Clean out was ensued using pituitary rongeurs, forward and  backward angled Collin curets, Kerrison rongeurs, and a high-speed drill.  All elements of the disk were removed as was the posterior annulus.  The  microscope was brought into the field and used for the remaining of the  diskectomy.  Many aspects of the annulus and osteophytes were removed using  the high-speed drill down to the posterior longitudinal ligament.  The  posterior longitudinal ligament was then elevated and resected in the usual  fashion using Kerrison rongeurs.  The underlying thecal sac was identified.  A wide central decompression was then performed by undercutting the bodies  of C3 and C4 using Kerrison rongeurs.  Decompression then proceeded into  each C4 foramen.  The C4 nerve roots  were identified proximally, and a blunt  probe was passed easily along the course of the exiting nerve roots.  The  wound was then irrigated with antibiotic solution.  Gelfoam was placed for  hemostasis, which was good.  A 7 mm patellar wedge allograft was then  impacted into placed and recessed approximately 1 mm from the anterior  cortical surface.  A 23 mm Atlantis anterior cervical plate was then placed  over the C3 and C4 level.  This was then attached under fluoroscopic  guidance using 13 mm variable angle screws, two each at the C3 and C4  levels.  All four screws underwent a final tightening and found to be solid  and in bone.  Locking screws were engaged at both levels.  Final images  revealed good position of the bone graft and proper levels normal in the  spine.  The wound was then irrigated with antibiotic solution.  Hemostasis  was ensured with the bipolar electrocautery.  The wound was then closed in a  typical fashion.  Steri-Strips and a sterile dressing were applied.  There  were no apparent complications.  The patient tolerated the procedure well,  and he was returned to the recovery room postoperatively.                                               [name], M.D.    HAP/MEDQ  D:  12/11/2001  T:  12/11/2001  Job:  [id number]

## 2010-07-15 NOTE — Assessment & Plan Note (Signed)
REFERRING MD:  Kathaleen Maser. Pool, M.D.   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   He is back in today.  He is independent with his self-care including  dressing, toileting, bathing, feeding, some independent meal prep.  He  rarely drives at this point.  He walks very infrequently maybe 15 minutes at  the most is what he can do at this point; however, he spends most of the day  sitting in his recliner watching TV and eating yogurt.  He reports that his  pain is typically located throughout his neck, bilateral shoulders, and down  both arms.  He has pain into the low back and knees as well as feet.   He has seen Dr. Stevphen Rochester twice and he has undergone the cervical facet medial  branch blocks at C4, 5, and 6 and he reports that they have not been  particularly helpful in managing his neck pain.  He is also currently  undergoing treatment with a bone stimulator as well.  He denies any new  problems with numbness, tingling, denies any new bowel or bladder problems.  His pain is typically on the average about an 8, worst in the last 24 hours  is about a 9 and least in the last 24 hours is about a 7.   On exam he is alert and cooperative, he appears very stiff in the chair and  uncomfortable.  His blood pressure is 110/50, pulse of 83, respirations 14,  98% saturated on room air.  He is able to get out of the chair.  His gait is  rather slow in the room, not antalgic, however.  He has extremely limited  cervical range of motion and shoulder range of motion is limited as well to  approximately 90 degrees.  He reports discomfort with all these maneuvers.  Seated reflexes are 1+ at the biceps, triceps, brachioradialis, 1+ at the  knees and ankles, toes are downgoing, no  clonus is noted.  Motor strength:  Patient refused motor strength testing at the shoulders, biceps and triceps  are at least 4+, wrist extensors are 5, and intrinsics and finger flexors  are 5 bilaterally.  Hip flexors, knee extensors, dorsiflexors, plantar  flexors, and EHL are all 5/5 in the lower extremities.  No significant  decreased sensation noted.   ASSESSMENT:  1. Cervicalgia.  2. Cervical and lumbar spondylosis.  3. Status post C4-5, C5-6 fusion, status post C3-4 anterior fusion with     plating.  Dr. 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   PLAN:  Victor [name] has not felt the medial branch blocks have been  particularly helpful.  I have encouraged Victor [name] to do some walking each  day.  He essentially does very Scism other than spend his time in the  recliner at this point.  I encourage him to walk 5 minutes twice a day and  once he can tolerate that try to increase it.  I also recommend he try to do  some active shoulder range of motion without any kind of weights just to  bring his shoulders through a functional range of motion each day.  At this  point he does not require any refills of any of his medications -  his other  doctors have been taking care of this.  We will discuss his case further  with Dr. Stevphen Rochester.  I  am not sure there is much to offer from that standpoint however, he may have  some other ideas.  At this point we will see Victor [name] back on a p.r.n.  basis.      Brantley Stage, M.D.   DMK/MedQ  D:  06/05/2003 18:19:32  T:  06/06/2003 21:51:23  Job #:  [id number]   cc:   Sherilyn Cooter A. Pool, M.D.  [address]  [place]  [place] [address]  Fax: [phone]
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
## 2010-07-15 NOTE — H&P (Signed)
[facility]  Patient:    Victor [name], Victor [name]                     MRN: [id number] Adm. Date:  [id number] Attending:  Thyra Breed CC:         Meredith Staggers, M.D.                         History and Physical  FOLLOWUP EVALUATION:  The patient comes in for followup evaluation of his chronic neck and interscapular discomfort.  Since his previous evaluation, he has noted that the OxyContin is helping to reduce his discomfort fairly significantly, by at least 50%.  It does constipate him pretty significantly and he has found that prune juice is the best alternative.   When he takes Ex-Lax or Correctol, it really upsets his stomach quite a bit and causes cramping.  He occasionally has breakthrough pain and notes that this comes on with overexertion or sitting at a computer for more than 20 minutes.  When this occurs, he takes an extra Zanaflex and applies ice for at least an hour. He notes when he lies back with pressure over his mid-dorsal spine, he develops increased symptoms consistent with his previous carpal tunnel syndrome.  He denied any other new neurologic symptoms.  He relates most of his back pain to the upper interscapular region and over the left trapezius muscles.  CURRENT MEDICATIONS 1. OxyContin 40 mg b.i.d. 2. Elavil 10 mg at night. 3. Celebrex alternating with Indocin. 4. Tenormin. 5. Zanaflex 4 mg, one in the morning, one at midday and two in the evening.  EXAMINATION  VITAL SIGNS:  Blood pressure 155/93, heart rate 64, respiratory rate 20, O2 saturation is 97% and pain level is 3/10.  NECK:  The patient demonstrates moderate restriction of range of motion of his neck with negative Spurlings sign.  NEUROLOGIC:  Deep tendon reflexes were 2+ at the biceps and 1+ at the triceps bilaterally.  He exhibits nonfocal sensory exam.  Motor was 5/5.  BACK:  He has some tenderness in the interscapular region.  It is predominantly localized  around C7.  IMPRESSION 1. Left shoulder and neck discomfort with history of anterior cervical    diskectomy with underlying cervical degenerative disk disease and probable    myofascial pain. 2. 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 3. Inflammatory arthropathy of the lower extremities per    Dr. Meredith Staggers. 4. Hypertension per Dr. Dayton Scrape.  DISPOSITION 1. Continue on current dose of Zanaflex 4 mg, 1 in the morning, 1 at midday    and 2 in the evening, #100 with 3 refills. 2. Continue OxyContin at current dose, which is 40 mg b.i.d.; he got a    prescription last week. 3. He was given a book list for neck exercises by ______ . 4. He has not found the Lidoderm patch to be of any benefit. 5. Follow up with me in four to eight weeks. DD:  02/24/00 TD:  02/24/00 Job: [id number]
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
## 2010-07-15 NOTE — Procedures (Signed)
[facility]  Patient:    ROLLAND, STEINERT                     MRN: [id number] Proc. Date: 09/02/99 Adm. Date:  [id number] Attending:  Thyra Breed CC:         Meredith Staggers, M.D.                           Procedure Report  FOLLOW-UP EVALUATION  HISTORY OF PRESENT ILLNESS:  [name] comes in for follow-up evaluation of his neck discomfort felt to be predominantly on the basis of myofascial pain with history of previous diskectomy. Since his previous evaluation, the patient has noted minimal benefits from the Zanaflex and he is up to 24 mg a day and he feels sedated from this. He continues to require the Vicodin. His pain continues to be predominantly in the intrascapular region involving the rhomboids with some burning type dysesthesias predominantly on the right side.  He is continuing on Vicodin, Zanaflex, Tenormin and Indocin p.r.n..  PHYSICAL EXAMINATION:  VITAL SIGNS:  Blood pressure 158/94, heart rate 61, respiratory rate 18, O2 saturations 98% and pain level is 6/10 and temperature is 97.2.  NECK:  Demonstrates good range of motion with negative Spurlings sign. He has tenderness in the rhomboids bilaterally.  NEUROLOGIC: Grossly unchanged.  IMPRESSION: 1. Myofascial pain of the rhomboids with history of previous surgical    intervention in the neck. 2. Lumbar degenerative disk disease. 3. Hypertension per Dr. Dayton Scrape.  DISPOSITION: 1. Discontinue Zanaflex. 2. Continue on Vicodin 5/500 one p.o. q. 6-8h p.r.n. #100 with no refill. 3. Baclofen 10 mg 1/2 tablet p.o. q. 8h #50 with 2 refills. 4. The patient plans to be evaluated at [facility] later this month    and I advised him that I would like to consider acupuncture after we get    an opinion and would like to see him back in about 6 weeks.           DD: 09/02/99 TD:  09/02/99 Job: [id number]
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
## 2010-07-15 NOTE — Procedures (Signed)
[facility]  Patient:    Victor [name], REIERSON                     MRN: [id number] Proc. Date: 08/17/00 Adm. Date:  [id number] Attending:  Thyra Breed CC:         Meredith Staggers, M.D.                           Procedure Report  PROCEDURE:  Right shoulder injection.  DIAGNOSIS:  Right shoulder pain with likely rotator cuff tendonitis.  INTERVAL HISTORY:  Victor [name] comes in for follow-up evaluation of his neck and right shoulder pain.  Since his last evaluation, he seems to be localizing most of his pain into the right shoulder and feels as though he has a partial tear.  He has had problems with this in the past and has seen Dr. Thomasena Edis.  He states that he has pain which is sharper in the shoulder which is made worse by raising his arm about 90 degrees, and he is dropping things at times.  He also has pain over the right lateral epicondylar region.  At times, the pain will radiate up into the neck region and out to the forearm.  At times, he has a colder hand on the right side than the left side.  He continues to use the Zanaflex, ice, OxyContin, and Vicodin for his pain control.  He takes the amitriptyline very sporadically.  He stopped taking the Celebrex.  He continues with the colchicine and Indocin for his gout, and he is on Tenormin for his high blood pressure.  He describes no new numbness or tingling.  He continues to be fairly limited through the course of the day because of his discomforts.  He notes if he tries to do a lot, he hurts for several days.  He is also having pain out into the left interscapular region.  PHYSICAL EXAMINATION:  Blood pressure 188/104, heart rate 71, respiratory rate 18, O2 saturations 100%.  Pain level is 6/10.  Deep tendon reflexes are symmetric in the upper extremity.  Motor is 5/5.  He has increased pain on resisted internal rotation of his right shoulder with minimal pain on resisted abduction and adduction.  He  does have crepitus on range of motion.  DESCRIPTION OF PROCEDURE:  After informed consent was obtained, I went ahead and marked the glenohumeral joint from a posterior approach over the skin and applied Betadine x 3 and allowed this to dry.  I injected the right shoulder with 40 mg of Medrol and 3 cc of 1% lidocaine.  The patient tolerated this well.  He noted immediate improvement in his pain.  Postprocedure condition - stable.  DISCHARGE INSTRUCTIONS: 1. Resume previous diet. 2. Limitations over the next five days that he does not do any heavy lifting.    I advised after an injection there is always a risk of tendon tears if you    try to overdo.  He was given a list of exercises to begin in about five    days using a soup can to try and recondition his shoulder. 3. Continue the OxyContin 40 mg 1 p.o. b.i.d. #60 with no refill.  Vicodin 1-2    p.o. q.6h. p.r.n. #60, and continue with the Zanaflex 4 mg 4-5 tablets per    day #100 with 9 refills. 4. Follow up with me in eight weeks.  He is to  let us know if he is not    improved with his shoulder in that time span.  I explained to him that I still suspect that some of his pain is coming from his cervical degenerative disk disease. DD:  08/17/00 TD:  08/17/00 Job: [id number]
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
## 2011-01-09 ENCOUNTER — Encounter (HOSPITAL_COMMUNITY): Payer: Self-pay | Admitting: Gastroenterology

## 2011-01-10 ENCOUNTER — Ambulatory Visit (HOSPITAL_COMMUNITY)
Admission: RE | Admit: 2011-01-10 | Discharge: 2011-01-10 | Disposition: A | Discharge status: Home / Self Care | Payer: 59 | Source: Ambulatory Visit | Attending: Gastroenterology | Admitting: Gastroenterology

## 2011-01-10 ENCOUNTER — Encounter (HOSPITAL_COMMUNITY): Payer: Self-pay | Admitting: *Deleted

## 2011-01-10 ENCOUNTER — Encounter (HOSPITAL_COMMUNITY): Payer: Self-pay | Admitting: Anesthesiology

## 2011-01-10 ENCOUNTER — Other Ambulatory Visit: Payer: Self-pay | Admitting: Gastroenterology

## 2011-01-10 ENCOUNTER — Encounter (HOSPITAL_COMMUNITY): Payer: Self-pay | Admitting: Gastroenterology

## 2011-01-10 ENCOUNTER — Ambulatory Visit (HOSPITAL_COMMUNITY): Payer: 59 | Admitting: Anesthesiology

## 2011-01-10 ENCOUNTER — Encounter (HOSPITAL_COMMUNITY)
Admission: RE | Disposition: A | Discharge status: Home / Self Care | Payer: Self-pay | Source: Ambulatory Visit | Attending: Gastroenterology

## 2011-01-10 DIAGNOSIS — D126 Benign neoplasm of colon, unspecified: Secondary | ICD-10-CM | POA: Insufficient documentation

## 2011-01-10 DIAGNOSIS — I10 Essential (primary) hypertension: Secondary | ICD-10-CM | POA: Insufficient documentation

## 2011-01-10 DIAGNOSIS — K648 Other hemorrhoids: Secondary | ICD-10-CM | POA: Insufficient documentation

## 2011-01-10 DIAGNOSIS — Z79899 Other long term (current) drug therapy: Secondary | ICD-10-CM | POA: Insufficient documentation

## 2011-01-10 DIAGNOSIS — R12 Heartburn: Secondary | ICD-10-CM

## 2011-01-10 DIAGNOSIS — K219 Gastro-esophageal reflux disease without esophagitis: Secondary | ICD-10-CM | POA: Insufficient documentation

## 2011-01-10 DIAGNOSIS — IMO0001 Reserved for inherently not codable concepts without codable children: Secondary | ICD-10-CM | POA: Insufficient documentation

## 2011-01-10 HISTORY — PX: POLYPECTOMY: SHX5525

## 2011-01-10 HISTORY — DX: Anxiety disorder, unspecified: F41.9

## 2011-01-10 HISTORY — DX: Unspecified osteoarthritis, unspecified site: M19.90

## 2011-01-10 HISTORY — DX: Gastro-esophageal reflux disease without esophagitis: K21.9

## 2011-01-10 HISTORY — DX: Essential (primary) hypertension: I10

## 2011-01-10 HISTORY — DX: Fibromyalgia: M79.7

## 2011-01-10 SURGERY — COLONOSCOPY WITH PROPOFOL
Anesthesia: Monitor Anesthesia Care

## 2011-01-10 MED ORDER — KETAMINE HCL 10 MG/ML IJ SOLN
INTRAMUSCULAR | Status: DC | PRN
  Administered 2011-01-10: 10 mg via INTRAVENOUS
  Administered 2011-01-10: 20 mg via INTRAVENOUS
  Administered 2011-01-10: 10 mg via INTRAVENOUS

## 2011-01-10 MED ORDER — SPOT INK MARKER SYRINGE KIT
PACK | SUBMUCOSAL | Status: DC | PRN
  Administered 2011-01-10: 5 mL via SUBMUCOSAL
  Administered 2011-01-10: 2 mL via SUBMUCOSAL

## 2011-01-10 MED ORDER — SPOT INK MARKER SYRINGE KIT
PACK | SUBMUCOSAL | Status: AC
  Filled 2011-01-10: qty 10

## 2011-01-10 MED ORDER — LACTATED RINGERS IV SOLN
INTRAVENOUS | Status: DC | PRN
  Administered 2011-01-10: 10:00:00 via INTRAVENOUS

## 2011-01-10 MED ORDER — FENTANYL CITRATE 0.05 MG/ML IJ SOLN
INTRAMUSCULAR | Status: DC | PRN
  Administered 2011-01-10: 50 ug via INTRAVENOUS

## 2011-01-10 MED ORDER — LIDOCAINE HCL (CARDIAC) 20 MG/ML IV SOLN
INTRAVENOUS | Status: DC | PRN
  Administered 2011-01-10: 80 mg via INTRAVENOUS

## 2011-01-10 MED ORDER — BUTAMBEN-TETRACAINE-BENZOCAINE 2-2-14 % EX AERO
INHALATION_SPRAY | CUTANEOUS | Status: DC | PRN
  Administered 2011-01-10: 1 via TOPICAL

## 2011-01-10 MED ORDER — PROPOFOL 10 MG/ML IV EMUL
INTRAVENOUS | Status: DC | PRN
  Administered 2011-01-10: 100 ug/kg/min via INTRAVENOUS

## 2011-01-10 MED ORDER — SODIUM CHLORIDE 0.9 % IV SOLN: Freq: Once | INTRAVENOUS | Status: DC

## 2011-01-10 MED ORDER — MIDAZOLAM HCL 5 MG/5ML IJ SOLN
INTRAMUSCULAR | Status: DC | PRN
  Administered 2011-01-10: 2 mg via INTRAVENOUS

## 2011-01-10 NOTE — Preoperative (Signed)
Beta Blockers   Reason not to administer Beta Blockers:100 mg Atenolol taken at 0800 01-10-11

## 2011-01-10 NOTE — Anesthesia Preprocedure Evaluation (Addendum)
Anesthesia Evaluation  Patient identified by MRN, date of birth, ID band Patient awake    Reviewed: Allergy & Precautions, H&P , NPO status , Patient's Chart, lab work & pertinent test results  History of Anesthesia Complications (+) DIFFICULT AIRWAY  Airway Mallampati: II TM Distance: >3 FB Neck ROM: Full    Dental No notable dental hx.    Pulmonary neg pulmonary ROS, shortness of breath and with exertion,  clear to auscultation  Pulmonary exam normal       Cardiovascular hypertension, Pt. on medications neg cardio ROS Regular Normal    Neuro/Psych Negative Neurological ROS  Negative Psych ROS   GI/Hepatic negative GI ROS, Neg liver ROS, GERD-  Medicated,  Endo/Other  Negative Endocrine ROS  Renal/GU negative Renal ROS  Genitourinary negative   Musculoskeletal negative musculoskeletal ROS (+) Fibromyalgia -  Abdominal   Peds negative pediatric ROS (+)  Hematology negative hematology ROS (+)   Anesthesia Other Findings   Reproductive/Obstetrics negative OB ROS                          Anesthesia Physical Anesthesia Plan  ASA: III  Anesthesia Plan: MAC   Post-op Pain Management:    Induction:   Airway Management Planned: Simple Face Mask and Nasal Cannula  Additional Equipment:   Intra-op Plan:   Post-operative Plan:   Informed Consent: I have reviewed the patients History and Physical, chart, labs and discussed the procedure including the risks, benefits and alternatives for the proposed anesthesia with the patient or authorized representative who has indicated his/her understanding and acceptance.   Dental advisory given  Plan Discussed with: CRNA  Anesthesia Plan Comments: (Pt on chronic narcotics for back pain, benzo's for anxiety)      Anesthesia Quick Evaluation

## 2011-01-10 NOTE — H&P (Signed)
Victor [name] is an 55 y.o. male.   Chief Complaint: heartburn  HPI: Victor [name] is here for heartburn and a history of a hiatal hernia. He has had heartburn for years and has been on Nexium that he states is not working as well as it used to. Denies trouble swallowing foods or liquids. He has had a pain under his rib cage for years. He also has had chronic back pain for years. He has a BM 3 times per day with the use of a laxative. He has never had a colonoscopy.   Past Medical History  Diagnosis Date  . Shortness of breath     with exertion  . Anxiety   . Hypertension   . GERD (gastroesophageal reflux disease)   . Fibromyalgia   . Chronic low back pain   . Arthritis     shoulders, both knees, arms    Past Surgical History  Procedure Date  . Back surgery     lower back sx, neck sx  3x,  . Tonsillectomy   . Appendectomy   . Hernia repair     both  . Shoulder arthroscopy     both  . Knee arthroscopy     both  . Carpal tunnel release     both  . Hand sx     left    No current facility-administered medications on file as of .   Medications Prior to Admission  Medication Sig Dispense Refill  . ALPRAZolam (XANAX) 1 MG tablet Take 1 mg by mouth every 8 (eight) hours.        Marland Kitchen atenolol (TENORMIN) 100 MG tablet Take 100 mg by mouth daily.        . colchicine 0.6 MG tablet Take 0.6 mg by mouth daily at 8 pm.        . diazepam (VALIUM) 5 MG tablet Take 5 mg by mouth every 8 (eight) hours as needed.        Marland Kitchen esomeprazole (NEXIUM) 40 MG capsule Take 40 mg by mouth daily before breakfast.        . fenofibrate 160 MG tablet Take 160 mg by mouth daily at 8 pm.        . fish oil-omega-3 fatty acids 1000 MG capsule Take 2 g by mouth daily. 3 tabs       . fluticasone (VERAMYST) 27.5 MCG/SPRAY nasal spray Place 2 sprays into the nose daily. 2 sprays to each nostrils       . gabapentin (NEURONTIN) 100 MG capsule Take 200 mg by mouth 3 (three) times daily.        Marland Kitchen HYDROmorphone  (DILAUDID) 2 MG tablet Take 2 mg by mouth every 8 (eight) hours as needed.        . Multiple Vitamins-Minerals (MULTIVITAMIN WITH MINERALS) tablet Take 1 tablet by mouth daily.        Marland Kitchen oxycodone (OXYCONTIN) 30 MG TB12 Take 60 mg by mouth 3 (three) times daily.        . simvastatin (ZOCOR) 40 MG tablet Take 40 mg by mouth at bedtime.        Marland Kitchen testosterone (ANDROGEL) 50 MG/5GM GEL Apply 1.62 g topically daily.        Marland Kitchen venlafaxine (EFFEXOR-XR) 150 MG 24 hr capsule Take 150 mg by mouth daily.        Marland Kitchen zolpidem (AMBIEN CR) 12.5 MG CR tablet Take 12.5 mg by mouth at bedtime as needed.  Allergies:  Allergies  Allergen Reactions  . Tetracyclines & Related Other (See Comments)    " makes me angry"    History reviewed. No pertinent family history.  Social History:  reports that he quit smoking about a year ago. He does not have any smokeless tobacco history on file. His alcohol and drug histories not on file.   ROS: Gen: Denies any fever, chills, rigors, night sweats, anorexia, fatigue, weakness, malaise, involuntary weight loss, and sleep disorder CV: Denies chest pain, angina, palpitations, syncope, orthopnea, PND, peripheral edema, and claudication. Resp: Denies dyspnea, cough, sputum, wheezing, coughing up blood. GI: see HPI for pertinent positives. Denies dysphagia, odynophagia, abdominal pain, nausea, vomiting, vomiting blood, jaundice, black stools, rectal bleeding, constipation, diarrhea, and fecal incontinence.    GU : Denies urinary burning, blood in urine, urinary frequency, urinary hesitancy, nocturnal urination, and urinary incontinence. MS: Denies joint pain or swelling.  Denies muscle weakness, cramps, atrophy.  Derm: Denies rash, itching, oral ulcerations, hives, unhealing ulcers.  Psych: Denies depression, anxiety, memory loss, suicidal ideation, hallucinations,  and confusion. Heme: Denies bruising, bleeding, and enlarged lymph nodes. Neuro:  Denies any headaches,  dizziness, paresthesias. Endo:  Denies any problems with DM, thyroid, adrenal function.    No results found for this or any previous visit (from the past 48 hour(s)). No results found.    There were no vitals taken for this visit. General appearance: no distress and morbidly obese Resp: clear to auscultation bilaterally Cardio: regular rate and rhythm, S1, S2 normal, no murmur, click, rub or gallop GI: soft, non-tender; bowel sounds normal; no masses,  no organomegaly Extremities: minimal pedal edema   Assessment/Plan 55yo man with heartburn that is fairly controlled with daily PPI therapy. In need of an EGD to further evaluate for Barrett's esophagus. Also, in need of a screening colonoscopy.  Dollie Bressi C. 01/10/2011, 9:17 AM

## 2011-01-10 NOTE — Brief Op Note (Signed)
01/10/2011  1:00 PM  PATIENT:  Victor [name]  55 y.o. male  PRE-OPERATIVE DIAGNOSIS:  screening/ heartburn  POST-OPERATIVE DIAGNOSIS:  hemorrhoids, colon polyps/ egd normal  PROCEDURE:  Procedure(s): COLONOSCOPY WITH PROPOFOL ESOPHAGOGASTRODUODENOSCOPY (EGD) WITH PROPOFOL  SURGEON:  Surgeon(s): Shirley Friar, MD   See procedure note.

## 2011-01-10 NOTE — Transfer of Care (Signed)
Immediate Anesthesia Transfer of Care Note  Patient: Victor [name]  Procedure(s) Performed:  COLONOSCOPY WITH PROPOFOL; ESOPHAGOGASTRODUODENOSCOPY (EGD) WITH PROPOFOL  Patient Location: PACU  Anesthesia Type: MAC  Level of Consciousness: awake, alert  and patient cooperative  Airway & Oxygen Therapy: Patient Spontanous Breathing and Patient connected to face mask oxygen  Post-op Assessment: Report given to PACU RN and Post -op Vital signs reviewed and stable  Post vital signs: Reviewed and stable  Complications: No apparent anesthesia complications

## 2011-01-10 NOTE — Anesthesia Postprocedure Evaluation (Signed)
  Anesthesia Post-op Note  Patient: Victor [name]  Procedure(s) Performed:  COLONOSCOPY WITH PROPOFOL; ESOPHAGOGASTRODUODENOSCOPY (EGD) WITH PROPOFOL  Patient Location: PACU  Anesthesia Type: MAC  Level of Consciousness: awake and alert   Airway and Oxygen Therapy: Patient Spontanous Breathing  Post-op Pain: mild  Post-op Assessment: Post-op Vital signs reviewed, Patient's Cardiovascular Status Stable, Respiratory Function Stable, Patent Airway and No signs of Nausea or vomiting  Post-op Vital Signs: stable  Complications: No apparent anesthesia complications

## 2011-01-22 ENCOUNTER — Encounter (HOSPITAL_COMMUNITY): Payer: Self-pay | Admitting: Gastroenterology

## 2011-07-10 ENCOUNTER — Other Ambulatory Visit: Payer: Self-pay | Admitting: Family Medicine

## 2011-07-10 DIAGNOSIS — M542 Cervicalgia: Secondary | ICD-10-CM

## 2011-07-14 ENCOUNTER — Ambulatory Visit
Admission: RE | Admit: 2011-07-14 | Discharge: 2011-07-14 | Disposition: A | Discharge status: Home / Self Care | Payer: 59 | Source: Ambulatory Visit | Attending: Family Medicine | Admitting: Family Medicine

## 2011-07-14 ENCOUNTER — Other Ambulatory Visit: Payer: Self-pay | Admitting: Family Medicine

## 2011-07-14 DIAGNOSIS — J3489 Other specified disorders of nose and nasal sinuses: Secondary | ICD-10-CM

## 2011-07-15 ENCOUNTER — Other Ambulatory Visit: Payer: 59

## 2011-07-17 ENCOUNTER — Ambulatory Visit
Admission: RE | Admit: 2011-07-17 | Discharge: 2011-07-17 | Disposition: A | Discharge status: Home / Self Care | Payer: 59 | Source: Ambulatory Visit | Attending: Family Medicine | Admitting: Family Medicine

## 2011-07-17 DIAGNOSIS — M542 Cervicalgia: Secondary | ICD-10-CM

## 2011-07-17 MED ORDER — GADOBENATE DIMEGLUMINE 529 MG/ML IV SOLN
20.0000 mL | Freq: Once | INTRAVENOUS | Status: AC | PRN
  Administered 2011-07-17: 20 mL via INTRAVENOUS

## 2011-10-13 ENCOUNTER — Other Ambulatory Visit: Payer: Self-pay | Admitting: Family Medicine

## 2011-10-13 DIAGNOSIS — M542 Cervicalgia: Secondary | ICD-10-CM

## 2011-10-16 ENCOUNTER — Ambulatory Visit
Admission: RE | Admit: 2011-10-16 | Discharge: 2011-10-16 | Disposition: A | Discharge status: Home / Self Care | Payer: 59 | Source: Ambulatory Visit | Attending: Family Medicine | Admitting: Family Medicine

## 2011-10-16 DIAGNOSIS — M542 Cervicalgia: Secondary | ICD-10-CM

## 2011-12-21 ENCOUNTER — Encounter (HOSPITAL_COMMUNITY): Payer: Self-pay | Admitting: Anesthesiology

## 2011-12-21 ENCOUNTER — Encounter (HOSPITAL_COMMUNITY)
Admission: RE | Disposition: A | Discharge status: Home / Self Care | Payer: Self-pay | Source: Ambulatory Visit | Attending: Gastroenterology

## 2011-12-21 ENCOUNTER — Encounter (HOSPITAL_COMMUNITY): Payer: Self-pay

## 2011-12-21 ENCOUNTER — Ambulatory Visit (HOSPITAL_COMMUNITY): Payer: 59 | Admitting: Anesthesiology

## 2011-12-21 ENCOUNTER — Ambulatory Visit (HOSPITAL_COMMUNITY)
Admission: RE | Admit: 2011-12-21 | Discharge: 2011-12-21 | Disposition: A | Discharge status: Home / Self Care | Payer: 59 | Source: Ambulatory Visit | Attending: Gastroenterology | Admitting: Gastroenterology

## 2011-12-21 DIAGNOSIS — G4730 Sleep apnea, unspecified: Secondary | ICD-10-CM | POA: Insufficient documentation

## 2011-12-21 DIAGNOSIS — K219 Gastro-esophageal reflux disease without esophagitis: Secondary | ICD-10-CM | POA: Insufficient documentation

## 2011-12-21 DIAGNOSIS — I10 Essential (primary) hypertension: Secondary | ICD-10-CM | POA: Insufficient documentation

## 2011-12-21 DIAGNOSIS — Z79899 Other long term (current) drug therapy: Secondary | ICD-10-CM | POA: Insufficient documentation

## 2011-12-21 DIAGNOSIS — D126 Benign neoplasm of colon, unspecified: Secondary | ICD-10-CM | POA: Insufficient documentation

## 2011-12-21 DIAGNOSIS — IMO0001 Reserved for inherently not codable concepts without codable children: Secondary | ICD-10-CM | POA: Insufficient documentation

## 2011-12-21 HISTORY — DX: Sleep apnea, unspecified: G47.30

## 2011-12-21 HISTORY — PX: FLEXIBLE SIGMOIDOSCOPY: SHX5431

## 2011-12-21 HISTORY — PX: HOT HEMOSTASIS: SHX5433

## 2011-12-21 SURGERY — SIGMOIDOSCOPY, FLEXIBLE
Anesthesia: Monitor Anesthesia Care

## 2011-12-21 MED ORDER — PROPOFOL 10 MG/ML IV EMUL
INTRAVENOUS | Status: DC | PRN
  Administered 2011-12-21: 100 ug/kg/min via INTRAVENOUS

## 2011-12-21 MED ORDER — KETAMINE HCL 10 MG/ML IJ SOLN
INTRAMUSCULAR | Status: DC | PRN
  Administered 2011-12-21: 10 mg via INTRAVENOUS

## 2011-12-21 MED ORDER — MIDAZOLAM HCL 5 MG/5ML IJ SOLN
INTRAMUSCULAR | Status: DC | PRN
  Administered 2011-12-21 (×2): 1 mg via INTRAVENOUS

## 2011-12-21 MED ORDER — LACTATED RINGERS IV SOLN
INTRAVENOUS | Status: DC | PRN
  Administered 2011-12-21: 10:00:00 via INTRAVENOUS

## 2011-12-21 MED ORDER — SODIUM CHLORIDE 0.9 % IV SOLN: INTRAVENOUS | Status: DC

## 2011-12-21 MED ORDER — FENTANYL CITRATE 0.05 MG/ML IJ SOLN
INTRAMUSCULAR | Status: DC | PRN
  Administered 2011-12-21 (×2): 50 ug via INTRAVENOUS

## 2011-12-21 NOTE — Preoperative (Signed)
Beta Blockers   Reason not to administer Beta Blockers:Not Applicable 

## 2011-12-21 NOTE — Anesthesia Preprocedure Evaluation (Addendum)
Anesthesia Evaluation  Patient identified by MRN, date of birth, ID band Patient awake    Reviewed: Allergy & Precautions, H&P , NPO status , Patient's Chart, lab work & pertinent test results  History of Anesthesia Complications (+) DIFFICULT AIRWAY  Airway Mallampati: II TM Distance: >3 FB Neck ROM: Full    Dental No notable dental hx.    Pulmonary neg pulmonary ROS, shortness of breath and with exertion, sleep apnea ,  breath sounds clear to auscultation  Pulmonary exam normal       Cardiovascular hypertension, Pt. on medications negative cardio ROS  Rhythm:Regular Rate:Normal     Neuro/Psych negative neurological ROS  negative psych ROS   GI/Hepatic negative GI ROS, Neg liver ROS, GERD-  Medicated,  Endo/Other  negative endocrine ROSMorbid obesity  Renal/GU negative Renal ROS  negative genitourinary   Musculoskeletal negative musculoskeletal ROS (+) Fibromyalgia -  Abdominal   Peds negative pediatric ROS (+)  Hematology negative hematology ROS (+)   Anesthesia Other Findings   Reproductive/Obstetrics negative OB ROS                           Anesthesia Physical  Anesthesia Plan  ASA: III  Anesthesia Plan: MAC   Post-op Pain Management:    Induction:   Airway Management Planned: Simple Face Mask and Nasal Cannula  Additional Equipment:   Intra-op Plan:   Post-operative Plan:   Informed Consent: I have reviewed the patients History and Physical, chart, labs and discussed the procedure including the risks, benefits and alternatives for the proposed anesthesia with the patient or authorized representative who has indicated his/her understanding and acceptance.   Dental advisory given  Plan Discussed with: CRNA  Anesthesia Plan Comments: (Pt on chronic narcotics for back pain, benzo's for anxiety)        Anesthesia Quick Evaluation

## 2011-12-21 NOTE — Op Note (Signed)
[facility] [address] [place], [address]   FLEXIBLE SIGMOIDOSCOPY PROCEDURE REPORT  PATIENT: Victor, [name]  MR#: [id number] BIRTHDATE: [birth year] , 56  yrs. old GENDER: Male ENDOSCOPIST: Charlott Rakes, MD REFERRED BY: PROCEDURE DATE:  12/21/2011 PROCEDURE:    Flexible sigmoidoscopy with snare polypectomy ASA CLASS:    Class III INDICATIONS: Colon polyp (surveillance for incomplete polypectomy) MEDICATIONS:    See Anesthesia report  DESCRIPTION OF PROCEDURE:   Rectal exam unremarkable. Pediatric colonoscope inserted into a fair prepped colon and advanced to the ascending colon. The previous tattoo site in the descending colon noted on insertion. No residual polyp seen immediately adjacent to tattoo site but about 2 folds distal to the tattoo site a 1 cm sessile polyp was noted that was removed completely with snare cautery. In the sigmoid colon a 5mm sessile polyp was removed with snare cautery. Small internal hemorrhoids noted on retroflexion in the rectum.      COMPLICATIONS:  None  ENDOSCOPIC IMPRESSION: Two colon polyps removed as stated above; No residual polyp seen at tattoo site; Internal hemorrhoids  RECOMMENDATIONS:  F/U on path; No aspirin products for 2 weeks   _______________________________ eSigned:  Charlott Rakes, MD 12/21/2011 10:31 AM

## 2011-12-21 NOTE — H&P (Signed)
  Date of Initial H&P: 12/14/11  History reviewed, patient examined, no change in status, stable for surgery. Flexible sigmoidoscopy to assess for residual polyp in the descending colon.

## 2011-12-21 NOTE — Transfer of Care (Signed)
Immediate Anesthesia Transfer of Care Note  Patient: Antwuan Rueff Jahr  Procedure(s) Performed: Procedure(s) (LRB) with comments: FLEXIBLE SIGMOIDOSCOPY (N/A) HOT HEMOSTASIS (ARGON PLASMA COAGULATION/BICAP) (N/A)  Patient Location: PACU  Anesthesia Type: MAC  Level of Consciousness: awake, alert , oriented and patient cooperative  Airway & Oxygen Therapy: Patient Spontanous Breathing and Patient connected to face mask oxygen  Post-op Assessment: Report given to PACU RN, Post -op Vital signs reviewed and stable and Patient moving all extremities X 4  Post vital signs: Reviewed and stable  Complications: No apparent anesthesia complications

## 2011-12-21 NOTE — Interval H&P Note (Signed)
History and Physical Interval Note:  12/21/2011 9:14 AM  Victor [name]  has presented today for surgery, with the diagnosis of colon polyp  The various methods of treatment have been discussed with the patient and family. After consideration of risks, benefits and other options for treatment, the patient has consented to  Procedure(s) (LRB) with comments: FLEXIBLE SIGMOIDOSCOPY (N/A) HOT HEMOSTASIS (ARGON PLASMA COAGULATION/BICAP) (N/A) as a surgical intervention .  The patient's history has been reviewed, patient examined, no change in status, stable for surgery.  I have reviewed the patient's chart and labs.  Questions were answered to the patient's satisfaction.     Guyla Bless C.

## 2011-12-21 NOTE — Anesthesia Postprocedure Evaluation (Signed)
  Anesthesia Post-op Note  Patient: Victor [name]  Procedure(s) Performed: Procedure(s) (LRB): FLEXIBLE SIGMOIDOSCOPY (N/A) HOT HEMOSTASIS (ARGON PLASMA COAGULATION/BICAP) (N/A)  Patient Location: PACU  Anesthesia Type: MAC  Level of Consciousness: awake and alert   Airway and Oxygen Therapy: Patient Spontanous Breathing  Post-op Pain: mild  Post-op Assessment: Post-op Vital signs reviewed, Patient's Cardiovascular Status Stable, Respiratory Function Stable, Patent Airway and No signs of Nausea or vomiting  Post-op Vital Signs: stable  Complications: No apparent anesthesia complications

## 2011-12-25 ENCOUNTER — Encounter (HOSPITAL_COMMUNITY): Payer: Self-pay | Admitting: Gastroenterology

## 2012-02-28 HISTORY — PX: NASAL SEPTUM SURGERY: SHX37

## 2012-03-13 ENCOUNTER — Other Ambulatory Visit: Payer: Self-pay | Admitting: Otolaryngology

## 2012-03-13 ENCOUNTER — Ambulatory Visit (HOSPITAL_COMMUNITY)
Admission: RE | Admit: 2012-03-13 | Discharge: 2012-03-13 | Disposition: A | Discharge status: Home / Self Care | Payer: 59 | Source: Ambulatory Visit | Attending: Otolaryngology | Admitting: Otolaryngology

## 2012-03-13 ENCOUNTER — Other Ambulatory Visit (HOSPITAL_COMMUNITY): Payer: Self-pay | Admitting: Otolaryngology

## 2012-03-13 ENCOUNTER — Ambulatory Visit
Admission: RE | Admit: 2012-03-13 | Discharge: 2012-03-13 | Disposition: A | Discharge status: Home / Self Care | Payer: 59 | Source: Ambulatory Visit | Attending: Otolaryngology | Admitting: Otolaryngology

## 2012-03-13 DIAGNOSIS — J40 Bronchitis, not specified as acute or chronic: Secondary | ICD-10-CM

## 2012-03-13 DIAGNOSIS — R059 Cough, unspecified: Secondary | ICD-10-CM | POA: Insufficient documentation

## 2012-03-13 DIAGNOSIS — R05 Cough: Secondary | ICD-10-CM | POA: Insufficient documentation

## 2012-03-13 DIAGNOSIS — J189 Pneumonia, unspecified organism: Secondary | ICD-10-CM

## 2012-03-13 DIAGNOSIS — R0989 Other specified symptoms and signs involving the circulatory and respiratory systems: Secondary | ICD-10-CM | POA: Insufficient documentation

## 2012-03-14 ENCOUNTER — Institutional Professional Consult (permissible substitution): Payer: 59 | Admitting: Internal Medicine

## 2012-03-15 ENCOUNTER — Ambulatory Visit (INDEPENDENT_AMBULATORY_CARE_PROVIDER_SITE_OTHER): Payer: 59 | Admitting: Internal Medicine

## 2012-03-15 ENCOUNTER — Encounter: Payer: Self-pay | Admitting: Internal Medicine

## 2012-03-15 VITALS — BP 120/90 | HR 63 | Temp 98.3°F | Ht 70.0 in | Wt 252.0 lb

## 2012-03-15 DIAGNOSIS — J209 Acute bronchitis, unspecified: Secondary | ICD-10-CM

## 2012-03-15 MED ORDER — PREDNISONE 10 MG PO TABS: ORAL_TABLET | ORAL | Status: DC

## 2012-03-15 NOTE — Patient Instructions (Addendum)
Your cough is due to post viral (likely flu) reactive cough ; no role for tamiflu anymore Can take weeks to months to slowly resolve Sinus drainage could be making it worse and can keep this alive; so do all of Dr Haroldine Laws advice and in addition do netti pot at night You might have underlying mild intermittent asthma or copd acting up now contributing to cough  - for this Please take Take prednisone 40mg  once daily x 3 days, then 30mg  once daily x 3 days, then 20mg  once daily x 3 days, then prednisone 10mg  once daily  x 3 days and stop Followup as needed if cough does not resolve in a month or so

## 2012-03-15 NOTE — Progress Notes (Signed)
Subjective:    Patient ID: Victor [name], male    DOB: 11-21-1955, 57 y.o.   MRN: [id number]  HPI  PCP Sissy Hoff, MD Referred by DR Haroldine Laws  Body mass index is 36.16 kg/(m^2).  History  Smoking status  . Former Smoker -- 0.2 packs/day for 2 years  . Types: Cigarettes  . Quit date: 01/08/2010  Smokeless tobacco  . Not on file     IOV 03/15/2012 57 year old male: REferred  For cough x 7-10 days of acute onset. Denies chronic cough. Wife says around xmas 2013 wife diagnosed with confirmed Flu (high fever, headache, URI symptoms, myalgia and s/p tamiflu x 5 days). Husband at that time already on anitibiotics for sinus issues (several antibiotics since may 2013 for sinus). Some 2 weeks later, around Mar 08, 2012)  patient/husband developed acute on chronic sinusitis  (started with sinus congestions, post nasal drip, but no fever) and then developed cough 7-10 days. He was already on antibiotics but when he got this changed levauin to Clinida. Cough is   Associated with gurglinr or gargling with sleep and also pPAst few days - terrible odor coming from mouth. So 2 days ago clinda changed to azithro at [place]; odor has improved after stopping clinda. AT onset, he denies fever but diagnosed with acute sinusitis.Says he gets recurrent sinusitis. Cough is worse when he lies down and that is when he hears gurgling. Feels like he is breathing water. Cough is severe in severity. Coughing a lot in office and wife says this is mild. Cough is dry but he feels like something inside chest and unable to bring sputum out.   Wife recollects similar episode years ago and Rx with advair which helped.   He smoked age 61 tto 54; unclear how many packs but <1 ppd.    CT Sinus 03/13/12    IMPRESSION:  1. Soft tissue nodule in the anterior nasal cavity with possible  osseous erosion. This raises the possibility of a granulomatous  lesion. Focal neoplasm is not excluded. Recommend direct    examination with externally and internally.  2. Progressive sinus disease with circumferential disease  involving the maxillary sinuses bilaterally and scattered ethmoid  opacification.  3. Moderate atrophy of the brain is stable.    CXR 03/13/12  - Clear per official and my reviewe   Dr Gretta Cool Reflux Symptom Index (> 13-15 suggestive of LPR cough) 0 -> 5  =  none ->severe problem 03/15/2012   Hoarseness of problem with voice 4  Clearing  Of Throat 4  Excess throat mucus or feeling of post nasal drip 5  Difficulty swallowing food, liquid or tablets 3  Cough after eating or lying down 2  Breathing difficulties or choking episodes 3  Troublesome or annoying cough 5  Sensation of something sticking in throat or lump in throat 4  Heartburn, chest pain, indigestion, or stomach acid coming up 0  TOTAL 30     Past Medical History  Diagnosis Date  . Shortness of breath     with exertion  . Anxiety   . Hypertension   . GERD (gastroesophageal reflux disease)   . Fibromyalgia   . Chronic low back pain   . Arthritis     shoulders, both knees, arms  . Dizziness - light-headed   . Balance problem   . Fall   . Acute URI in September with cough  . Heartburn 01/10/2011  . Sleep apnea  occ cpap     Family History  Problem Relation Age of Onset  . Hypertension Father      History   Social History  . Marital Status: Married    Spouse Name: N/A    Number of Children: N/A  . Years of Education: N/A   Occupational History  . Not on file.   Social History Main Topics  . Smoking status: Former Smoker -- 0.2 packs/day for 2 years    Types: Cigarettes    Quit date: 01/08/2010  . Smokeless tobacco: Not on file  . Alcohol Use: Yes     Comment: occasional drink  . Drug Use: No  . Sexually Active: Not on file   Other Topics Concern  . Not on file   Social History Narrative  . No narrative on file     Allergies  Allergen Reactions  . Tetracyclines & Related  Other (See Comments)    " makes me angry"     Outpatient Prescriptions Prior to Visit  Medication Sig Dispense Refill  . ALPRAZolam (XANAX) 1 MG tablet Take 1 mg by mouth every 8 (eight) hours.        Marland Kitchen atenolol (TENORMIN) 100 MG tablet Take 100 mg by mouth daily.        . colchicine 0.6 MG tablet Take 0.6 mg by mouth daily at 8 pm.        . diazepam (VALIUM) 5 MG tablet Take 5 mg by mouth every 8 (eight) hours as needed.        . fish oil-omega-3 fatty acids 1000 MG capsule Take 2 g by mouth daily. 3 tabs       . fluticasone (VERAMYST) 27.5 MCG/SPRAY nasal spray Place 2 sprays into the nose daily. 2 sprays to each nostrils       . gabapentin (NEURONTIN) 100 MG capsule Take 200 mg by mouth 3 (three) times daily.        . Multiple Vitamins-Minerals (MULTIVITAMIN WITH MINERALS) tablet Take 1 tablet by mouth daily.        Marland Kitchen omeprazole (PRILOSEC) 40 MG capsule Take 40 mg by mouth daily.      Marland Kitchen venlafaxine (EFFEXOR-XR) 150 MG 24 hr capsule Take 150 mg by mouth daily.        Marland Kitchen zolpidem (AMBIEN CR) 12.5 MG CR tablet Take 12.5 mg by mouth at bedtime as needed.         Last reviewed on 03/15/2012  3:36 PM by Darrell Jewel, CMA     Review of Systems  Constitutional: Negative for fever and unexpected weight change.  HENT: Positive for ear pain, sore throat and sneezing. Negative for nosebleeds, congestion, rhinorrhea, trouble swallowing, dental problem, postnasal drip and sinus pressure.   Eyes: Negative for redness and itching.  Respiratory: Positive for cough and shortness of breath. Negative for chest tightness and wheezing.   Cardiovascular: Positive for chest pain. Negative for palpitations and leg swelling.  Gastrointestinal: Negative for nausea and vomiting.  Genitourinary: Negative for dysuria.  Musculoskeletal: Negative for joint swelling.  Skin: Negative for rash.  Neurological: Positive for headaches.  Hematological: Does not bruise/bleed easily.  Psychiatric/Behavioral:  Negative for dysphoric mood. The patient is not nervous/anxious.        Objective:   Physical Exam  Nursing note and vitals reviewed. Constitutional: He is oriented to person, place, and time. He appears well-developed and well-nourished. No distress.       Body mass index is 36.16 kg/(m^2).  HENT:  Head: Normocephalic and atraumatic.  Right Ear: External ear normal.  Left Ear: External ear normal.  Mouth/Throat: Oropharynx is clear and moist. No oropharyngeal exudate.       Coughing a lot Post nasal drip+  Eyes: Conjunctivae normal and EOM are normal. Pupils are equal, round, and reactive to light. Right eye exhibits no discharge. Left eye exhibits no discharge. No scleral icterus.  Neck: Normal range of motion. Neck supple. No JVD present. No tracheal deviation present. No thyromegaly present.  Cardiovascular: Normal rate, regular rhythm and intact distal pulses.  Exam reveals no gallop and no friction rub.   No murmur heard. Pulmonary/Chest: Effort normal. No respiratory distress. He has wheezes. He has no rales. He exhibits no tenderness.  Abdominal: Soft. Bowel sounds are normal. He exhibits no distension and no mass. There is no tenderness. There is no rebound and no guarding.  Musculoskeletal: Normal range of motion. He exhibits no edema and no tenderness.       Sitting in chair. Has cane for assist  Lymphadenopathy:    He has no cervical adenopathy.  Neurological: He is alert and oriented to person, place, and time. He has normal reflexes. No cranial nerve deficit. Coordination normal.  Skin: Skin is warm and dry. No rash noted. He is not diaphoretic. No erythema. No pallor.  Psychiatric: He has a normal mood and affect. His behavior is normal. Judgment and thought content normal.          Assessment & Plan:

## 2012-03-17 NOTE — Assessment & Plan Note (Signed)
Your cough is due to post viral (likely flu) reactive cough ; no role for tamiflu anymore Can take weeks to months to slowly resolve Sinus drainage could be making it worse and can keep this alive; so do all of Dr Crossley advice and in addition do netti pot at night You might have underlying mild intermittent asthma or copd acting up now contributing to cough  - for this Please take Take prednisone 40mg once daily x 3 days, then 30mg once daily x 3 days, then 20mg once daily x 3 days, then prednisone 10mg once daily  x 3 days and stop Followup as needed if cough does not resolve in a month or so  

## 2012-04-17 ENCOUNTER — Ambulatory Visit: Payer: 59 | Admitting: Internal Medicine

## 2012-05-17 ENCOUNTER — Telehealth: Payer: Self-pay | Admitting: Internal Medicine

## 2012-05-17 NOTE — Telephone Encounter (Signed)
S/W PT IN RE NP APPT 04/21 @ 1:30 W/DR. MOHAMED REFERRING DR.DAVID SWAYNE DX- ELEVATED HGB  WELCOME PACKET MAILED.

## 2012-05-20 ENCOUNTER — Telehealth: Payer: Self-pay | Admitting: Internal Medicine

## 2012-05-20 NOTE — Telephone Encounter (Signed)
C/D 05/20/12 for appt. 06/17/12

## 2012-06-17 ENCOUNTER — Other Ambulatory Visit (HOSPITAL_BASED_OUTPATIENT_CLINIC_OR_DEPARTMENT_OTHER): Payer: 59 | Admitting: Lab

## 2012-06-17 ENCOUNTER — Encounter: Payer: Self-pay | Admitting: Internal Medicine

## 2012-06-17 ENCOUNTER — Ambulatory Visit (HOSPITAL_BASED_OUTPATIENT_CLINIC_OR_DEPARTMENT_OTHER): Payer: 59 | Admitting: Internal Medicine

## 2012-06-17 ENCOUNTER — Encounter: Payer: Self-pay | Admitting: Lab

## 2012-06-17 ENCOUNTER — Ambulatory Visit: Payer: 59

## 2012-06-17 VITALS — BP 139/98 | HR 74 | Temp 98.5°F | Resp 18 | Ht 70.0 in | Wt 255.2 lb

## 2012-06-17 DIAGNOSIS — E291 Testicular hypofunction: Secondary | ICD-10-CM

## 2012-06-17 DIAGNOSIS — D751 Secondary polycythemia: Secondary | ICD-10-CM

## 2012-06-17 DIAGNOSIS — I10 Essential (primary) hypertension: Secondary | ICD-10-CM

## 2012-06-17 LAB — CBC WITH DIFFERENTIAL/PLATELET
Basophils Absolute: 0 10*3/uL (ref 0.0–0.1)
EOS%: 1.8 % (ref 0.0–7.0)
HCT: 53.5 % — ABNORMAL HIGH (ref 38.4–49.9)
HGB: 18.2 g/dL — ABNORMAL HIGH (ref 13.0–17.1)
LYMPH%: 32.6 % (ref 14.0–49.0)
MCH: 30.9 pg (ref 27.2–33.4)
MCV: 90.9 fL (ref 79.3–98.0)
MONO%: 8.7 % (ref 0.0–14.0)
NEUT%: 56.4 % (ref 39.0–75.0)
RBC: 5.89 10*6/uL — ABNORMAL HIGH (ref 4.20–5.82)
RDW: 14.4 % (ref 11.0–14.6)

## 2012-06-17 LAB — COMPREHENSIVE METABOLIC PANEL (CC13)
Albumin: 3.9 g/dL (ref 3.5–5.0)
Alkaline Phosphatase: 58 U/L (ref 40–150)
BUN: 11.1 mg/dL (ref 7.0–26.0)
Creatinine: 1 mg/dL (ref 0.7–1.3)
Glucose: 114 mg/dl — ABNORMAL HIGH (ref 70–99)
Sodium: 138 mEq/L (ref 136–145)
Total Bilirubin: 0.82 mg/dL (ref 0.20–1.20)

## 2012-06-17 LAB — LACTATE DEHYDROGENASE (CC13): LDH: 235 U/L (ref 125–245)

## 2012-06-17 NOTE — Progress Notes (Signed)
Checked in new patient. No financial issues. °

## 2012-06-17 NOTE — Progress Notes (Signed)
[facility] [phone]   [phone]  CONSULT NOTE  REFERRING PHYSICIAN: Dr. Vernie Ammons  REASON FOR CONSULTATION:  57 years old white male with polycythemia.  HPI Victor [name] is a 57 y.o. male was past medical history significant for multiple medical problems including history of hypertension, GERD, fibromyalgia, arthritis, anxiety and androgen deficiency. The patient was referred to me today by Dr. Vernie Ammons for evaluation of polycythemia. The patient was noted on several blood work over the last year to have elevated hemoglobin and hematocrit. One episode 2013 his hemoglobin was 16.3 and hematocrit 38.9, on 10/13/2011 his hemoglobin was 17.9 and hematocrit 53.6 and platelet 05/08/2012 hemoglobin was 18.3 with hematocrit of 56.2. Reviewing his previous records, I noted that the patient had hemoglobin of 13.0 and hematocrit 37.5 on 01/04/2009. The patient mentions that he was started on testosterone injection for androgen deficiency 2 years ago. This was incident with the increase in his hemoglobin and hematocrit. He is feeling fine today with no specific complaints except for sinus drainage and occasional throat tightness.  He denied having any significant chest pain, shortness breath or hemoptysis but has mild cough. He has no significant weight loss or night sweats. He has no headache or blurry vision.  @SFHPI @  Past Medical History  Diagnosis Date  . Shortness of breath     with exertion  . Anxiety   . Hypertension   . GERD (gastroesophageal reflux disease)   . Fibromyalgia   . Chronic low back pain   . Arthritis     shoulders, both knees, arms  . Dizziness - light-headed   . Balance problem   . Fall   . Acute URI in September with cough  . Heartburn 01/10/2011  . Sleep apnea     occ cpap    Past Surgical History  Procedure Laterality Date  . Back surgery      lower back sx, neck sx  3x,  . Tonsillectomy    . Appendectomy    .  Hernia repair      both  . Shoulder arthroscopy      both  . Knee arthroscopy      both  . Carpal tunnel release      both  . Hand sx      left  . Polypectomy  01/10/2011    Procedure: POLYPECTOMY;  Surgeon: Shirley Friar, MD;  Location: [facility];  Service: Endoscopy;  Laterality: N/A;  . Flexible sigmoidoscopy  12/21/2011    Procedure: FLEXIBLE SIGMOIDOSCOPY;  Surgeon: Shirley Friar, MD;  Location: [facility];  Service: Endoscopy;  Laterality: N/A;  . Hot hemostasis  12/21/2011    Procedure: HOT HEMOSTASIS (ARGON PLASMA COAGULATION/BICAP);  Surgeon: Shirley Friar, MD;  Location: [facility];  Service: Endoscopy;  Laterality: N/A;    Family History  Problem Relation Age of Onset  . Hypertension Father     Social History History  Substance Use Topics  . Smoking status: Former Smoker -- 0.20 packs/day for 2 years    Types: Cigarettes    Quit date: 01/08/2010  . Smokeless tobacco: Not on file  . Alcohol Use: Yes     Comment: occasional drink    Allergies  Allergen Reactions  . Tetracyclines & Related Other (See Comments)    " makes me angry"    Current Outpatient Prescriptions  Medication Sig Dispense Refill  . allopurinol (ZYLOPRIM) 300 MG tablet       . ALPRAZolam (  XANAX) 1 MG tablet Take 1 mg by mouth every 8 (eight) hours as needed.       Marland Kitchen atenolol (TENORMIN) 100 MG tablet Take 100 mg by mouth daily.        Marland Kitchen b complex vitamins tablet Take 1 tablet by mouth daily.      . calcium carbonate 200 MG capsule Take 250 mg by mouth 2 (two) times daily with a meal. With D 3      . colchicine 0.6 MG tablet Take 0.6 mg by mouth daily at 8 pm.        . diazepam (VALIUM) 5 MG tablet Take 5 mg by mouth every 8 (eight) hours as needed.        . fish oil-omega-3 fatty acids 1000 MG capsule Take 2 g by mouth daily. 3 tabs       . fluticasone (VERAMYST) 27.5 MCG/SPRAY nasal spray Place 2 sprays into the nose daily. 2 sprays to each nostrils       .  gabapentin (NEURONTIN) 100 MG capsule Take 200 mg by mouth 3 (three) times daily.        . Misc Natural Products (GINSENG COMPLEX PO) Take by mouth.      . Multiple Vitamins-Minerals (MULTIVITAMIN WITH MINERALS) tablet Take 1 tablet by mouth daily.        Marland Kitchen omeprazole (PRILOSEC) 40 MG capsule Take 40 mg by mouth daily.      Marland Kitchen UNABLE TO FIND instaflex tablet for muscle pain, daily      . venlafaxine (EFFEXOR-XR) 150 MG 24 hr capsule Take 150 mg by mouth daily.        Marland Kitchen VITAMIN E COMPLEX PO Take by mouth.      . zolpidem (AMBIEN CR) 12.5 MG CR tablet Take 12.5 mg by mouth at bedtime as needed.        Marland Kitchen lisinopril (PRINIVIL,ZESTRIL) 5 MG tablet       . testosterone cypionate (DEPOTESTOTERONE CYPIONATE) 200 MG/ML injection        No current facility-administered medications for this visit.    Review of Systems  A comprehensive review of systems was negative except for: Ears, nose, mouth, throat, and face: positive for sore throat Respiratory: positive for cough  Physical Exam  ZOX:WRUEA, healthy, no distress, well nourished and well developed SKIN: skin color, texture, turgor are normal HEAD: Normocephalic, No masses, lesions, tenderness or abnormalities EYES: normal, PERRLA EARS: External ears normal OROPHARYNX:no exudate and no erythema  NECK: supple, no adenopathy LYMPH:  no palpable lymphadenopathy, no hepatosplenomegaly LUNGS: clear to auscultation , and palpation HEART: regular rate & rhythm and no murmurs ABDOMEN:abdomen soft, non-tender, normal bowel sounds and no masses or organomegaly BACK: Back symmetric, no curvature. EXTREMITIES:no joint deformities, effusion, or inflammation, no edema  NEURO: alert & oriented x 3 with fluent speech, no focal motor/sensory deficits  PERFORMANCE STATUS: ECOG 1  LABORATORY DATA: Lab Results  Component Value Date   WBC 7.2 06/17/2012   HGB 18.2* 06/17/2012   HCT 53.5* 06/17/2012   MCV 90.9 06/17/2012   PLT 222 06/17/2012       Chemistry      Component Value Date/Time   NA 142 01/04/2009 0600   K 4.3 01/04/2009 0600   CL 105 01/04/2009 0600   CO2 29 01/04/2009 0600   BUN 7 01/04/2009 0600   CREATININE 1.28 01/04/2009 0600      Component Value Date/Time   CALCIUM 9.1 01/04/2009 0600   ALKPHOS 37* 12/30/2008  2229   AST 31 12/30/2008 2229   ALT 21 12/30/2008 2229   BILITOT 1.2 12/30/2008 2229       RADIOGRAPHIC STUDIES: No results found.  ASSESSMENT: This is a very pleasant 57 years old white male with polycythemia most likely reactive in nature secondary to hormonal treatment with testosterone for androgen deficiency   PLAN: I have a lengthy discussion with the patient and his wife today about his current condition and treatment options. I ordered several studies to evaluate his basis including repeat CBC, comprehensive metabolic panel, LDH and erythropoietin level. I recommended for the patient to discuss with Dr. Vernie Ammons decreasing the dose of his testosterone as tolerated. I also advised the patient to consider phlebotomy either at the [facility] or at the [facility] to decrease his hematocrit to be between 45-50%. I also advise the patient against eating a lot of red meat or Iron supplements. I don't see a need for the patient to continue routine followup visit with me at this point but will be happy to see him in the future. I gave the patient and his wife the time to ask questions and answered them completely to their satisfaction.  All questions were answered. The patient knows to call the clinic with any problems, questions or concerns. We can certainly see the patient much sooner if necessary.  Thank you so much for allowing me to participate in the care of Victor [name]. I will continue to follow up the patient with you and assist in his care.  I spent 30 minutes counseling the patient face to face. The total time spent in the appointment was 55 minutes.  Jachelle Fluty K. 06/17/2012,  2:54 PM

## 2012-06-17 NOTE — Patient Instructions (Signed)
Your elevated hemoglobin and hematocrit is most likely secondary to testosterone treatment. Consider phlebotomy to reduce your hemoglobin.

## 2012-07-19 ENCOUNTER — Other Ambulatory Visit: Payer: Self-pay | Admitting: Otolaryngology

## 2013-04-15 ENCOUNTER — Observation Stay (HOSPITAL_COMMUNITY)
Admission: EM | Admit: 2013-04-15 | Discharge: 2013-04-16 | Disposition: A | Discharge status: Home / Self Care | Payer: 59 | Attending: General Surgery | Admitting: General Surgery

## 2013-04-15 ENCOUNTER — Encounter (HOSPITAL_COMMUNITY): Payer: Self-pay | Admitting: Emergency Medicine

## 2013-04-15 ENCOUNTER — Encounter (HOSPITAL_COMMUNITY): Payer: 59 | Admitting: Anesthesiology

## 2013-04-15 ENCOUNTER — Emergency Department (HOSPITAL_COMMUNITY): Payer: 59 | Admitting: Anesthesiology

## 2013-04-15 ENCOUNTER — Encounter (HOSPITAL_COMMUNITY)
Admission: EM | Disposition: A | Discharge status: Home / Self Care | Payer: Self-pay | Source: Home / Self Care | Attending: Emergency Medicine

## 2013-04-15 DIAGNOSIS — M545 Low back pain, unspecified: Secondary | ICD-10-CM | POA: Insufficient documentation

## 2013-04-15 DIAGNOSIS — IMO0001 Reserved for inherently not codable concepts without codable children: Secondary | ICD-10-CM | POA: Insufficient documentation

## 2013-04-15 DIAGNOSIS — K4030 Unilateral inguinal hernia, with obstruction, without gangrene, not specified as recurrent: Secondary | ICD-10-CM

## 2013-04-15 DIAGNOSIS — R0602 Shortness of breath: Secondary | ICD-10-CM | POA: Insufficient documentation

## 2013-04-15 DIAGNOSIS — G4730 Sleep apnea, unspecified: Secondary | ICD-10-CM | POA: Insufficient documentation

## 2013-04-15 DIAGNOSIS — M109 Gout, unspecified: Secondary | ICD-10-CM | POA: Insufficient documentation

## 2013-04-15 DIAGNOSIS — K4031 Unilateral inguinal hernia, with obstruction, without gangrene, recurrent: Secondary | ICD-10-CM

## 2013-04-15 DIAGNOSIS — Z87891 Personal history of nicotine dependence: Secondary | ICD-10-CM | POA: Insufficient documentation

## 2013-04-15 DIAGNOSIS — K219 Gastro-esophageal reflux disease without esophagitis: Secondary | ICD-10-CM | POA: Insufficient documentation

## 2013-04-15 DIAGNOSIS — I10 Essential (primary) hypertension: Secondary | ICD-10-CM | POA: Insufficient documentation

## 2013-04-15 DIAGNOSIS — D45 Polycythemia vera: Secondary | ICD-10-CM | POA: Insufficient documentation

## 2013-04-15 DIAGNOSIS — F411 Generalized anxiety disorder: Secondary | ICD-10-CM | POA: Insufficient documentation

## 2013-04-15 HISTORY — PX: LAPAROSCOPY: SHX197

## 2013-04-15 HISTORY — PX: INGUINAL HERNIA REPAIR: SUR1180

## 2013-04-15 HISTORY — PX: INSERTION OF MESH: SHX5868

## 2013-04-15 HISTORY — DX: Gout, unspecified: M10.9

## 2013-04-15 HISTORY — DX: Pure hypercholesterolemia, unspecified: E78.00

## 2013-04-15 HISTORY — PX: INGUINAL HERNIA REPAIR: SHX194

## 2013-04-15 HISTORY — DX: Pneumonia, unspecified organism: J18.9

## 2013-04-15 LAB — COMPREHENSIVE METABOLIC PANEL
ALBUMIN: 4 g/dL (ref 3.5–5.2)
ALT: 40 U/L (ref 0–53)
AST: 71 U/L — ABNORMAL HIGH (ref 0–37)
Alkaline Phosphatase: 46 U/L (ref 39–117)
BILIRUBIN TOTAL: 0.8 mg/dL (ref 0.3–1.2)
BUN: 13 mg/dL (ref 6–23)
CO2: 22 mEq/L (ref 19–32)
CREATININE: 0.96 mg/dL (ref 0.50–1.35)
Calcium: 9.6 mg/dL (ref 8.4–10.5)
Chloride: 100 mEq/L (ref 96–112)
GFR calc non Af Amer: >90 mL/min (ref >90)
GLUCOSE: 140 mg/dL — AB (ref 70–99)
Potassium: 4.4 mEq/L (ref 3.7–5.3)
Sodium: 139 mEq/L (ref 137–147)
Total Protein: 7.5 g/dL (ref 6.0–8.3)

## 2013-04-15 LAB — CBC WITH DIFFERENTIAL/PLATELET
Basophils Absolute: 0 10*3/uL (ref 0.0–0.1)
Basophils Relative: 0 % (ref 0–1)
EOS ABS: 0.2 10*3/uL (ref 0.0–0.7)
Eosinophils Relative: 2 % (ref 0–5)
HEMATOCRIT: 48.8 % (ref 39.0–52.0)
HEMOGLOBIN: 17.2 g/dL — AB (ref 13.0–17.0)
LYMPHS ABS: 1.6 10*3/uL (ref 0.7–4.0)
Lymphocytes Relative: 23 % (ref 12–46)
MCH: 32.6 pg (ref 26.0–34.0)
MCHC: 35.2 g/dL (ref 30.0–36.0)
MCV: 92.6 fL (ref 78.0–100.0)
MONO ABS: 0.8 10*3/uL (ref 0.1–1.0)
MONOS PCT: 11 % (ref 3–12)
Neutro Abs: 4.4 10*3/uL (ref 1.7–7.7)
Neutrophils Relative %: 63 % (ref 43–77)
Platelets: 200 10*3/uL (ref 150–400)
RBC: 5.27 MIL/uL (ref 4.22–5.81)
RDW: 14.6 % (ref 11.5–15.5)
WBC: 7 10*3/uL (ref 4.0–10.5)

## 2013-04-15 LAB — GLUCOSE, CAPILLARY: GLUCOSE-CAPILLARY: 108 mg/dL — AB (ref 70–99)

## 2013-04-15 LAB — LIPASE, BLOOD: LIPASE: 47 U/L (ref 11–59)

## 2013-04-15 LAB — CG4 I-STAT (LACTIC ACID): LACTIC ACID, VENOUS: 1.41 mmol/L (ref 0.5–2.2)

## 2013-04-15 SURGERY — LAPAROSCOPY, DIAGNOSTIC
Anesthesia: General | Site: Abdomen

## 2013-04-15 MED ORDER — LIDOCAINE HCL (CARDIAC) 20 MG/ML IV SOLN
INTRAVENOUS | Status: DC | PRN
  Administered 2013-04-15: 80 mg via INTRAVENOUS

## 2013-04-15 MED ORDER — EPHEDRINE SULFATE 50 MG/ML IJ SOLN
INTRAMUSCULAR | Status: AC
  Filled 2013-04-15: qty 1

## 2013-04-15 MED ORDER — HYDROCODONE-ACETAMINOPHEN 5-325 MG PO TABS
1.0000 | ORAL_TABLET | ORAL | Status: DC | PRN
  Administered 2013-04-15 – 2013-04-16 (×4): 2 via ORAL
  Filled 2013-04-15 (×4): qty 2

## 2013-04-15 MED ORDER — DEXTROSE 5 % IV SOLN
2.0000 g | INTRAVENOUS | Status: AC
  Administered 2013-04-15: 2 g via INTRAVENOUS
  Filled 2013-04-15: qty 2

## 2013-04-15 MED ORDER — HYDROMORPHONE HCL PF 1 MG/ML IJ SOLN
0.2500 mg | INTRAMUSCULAR | Status: DC | PRN
  Administered 2013-04-15: 0.5 mg via INTRAVENOUS

## 2013-04-15 MED ORDER — FENTANYL CITRATE 0.05 MG/ML IJ SOLN
INTRAMUSCULAR | Status: DC | PRN
  Administered 2013-04-15 (×2): 50 ug via INTRAVENOUS

## 2013-04-15 MED ORDER — SUCCINYLCHOLINE CHLORIDE 20 MG/ML IJ SOLN
INTRAMUSCULAR | Status: DC | PRN
  Administered 2013-04-15: 180 mg via INTRAVENOUS

## 2013-04-15 MED ORDER — OXYCODONE HCL 5 MG/5ML PO SOLN: 5.0000 mg | Freq: Once | ORAL | Status: AC | PRN

## 2013-04-15 MED ORDER — ALPRAZOLAM 0.5 MG PO TABS: 1.0000 mg | ORAL_TABLET | Freq: Three times a day (TID) | ORAL | Status: DC | PRN

## 2013-04-15 MED ORDER — GLYCOPYRROLATE 0.2 MG/ML IJ SOLN
INTRAMUSCULAR | Status: DC | PRN
  Administered 2013-04-15: 1 mg via INTRAVENOUS
  Administered 2013-04-15: 0.4 mg via INTRAVENOUS

## 2013-04-15 MED ORDER — ALLOPURINOL 300 MG PO TABS
300.0000 mg | ORAL_TABLET | Freq: Every day | ORAL | Status: DC
  Administered 2013-04-16: 300 mg via ORAL
  Filled 2013-04-15: qty 1

## 2013-04-15 MED ORDER — MORPHINE SULFATE 2 MG/ML IJ SOLN
1.0000 mg | INTRAMUSCULAR | Status: DC | PRN
  Administered 2013-04-15 (×2): 2 mg via INTRAVENOUS
  Administered 2013-04-16: 4 mg via INTRAVENOUS
  Administered 2013-04-16: 2 mg via INTRAVENOUS
  Administered 2013-04-16: 4 mg via INTRAVENOUS
  Filled 2013-04-15 (×3): qty 1
  Filled 2013-04-15 (×2): qty 2

## 2013-04-15 MED ORDER — EPHEDRINE SULFATE 50 MG/ML IJ SOLN
INTRAMUSCULAR | Status: DC | PRN
  Administered 2013-04-15: 10 mg via INTRAVENOUS
  Administered 2013-04-15: 15 mg via INTRAVENOUS

## 2013-04-15 MED ORDER — PHENYLEPHRINE HCL 10 MG/ML IJ SOLN
INTRAMUSCULAR | Status: DC | PRN
  Administered 2013-04-15: 80 ug via INTRAVENOUS
  Administered 2013-04-15: 120 ug via INTRAVENOUS

## 2013-04-15 MED ORDER — LISINOPRIL 5 MG PO TABS
5.0000 mg | ORAL_TABLET | Freq: Every day | ORAL | Status: DC
  Administered 2013-04-16: 5 mg via ORAL
  Filled 2013-04-15 (×2): qty 1

## 2013-04-15 MED ORDER — HYDROMORPHONE HCL PF 1 MG/ML IJ SOLN
INTRAMUSCULAR | Status: AC
  Filled 2013-04-15: qty 1

## 2013-04-15 MED ORDER — DEXTROSE 5 % IV SOLN
2.0000 g | INTRAVENOUS | Status: DC
  Filled 2013-04-15: qty 2

## 2013-04-15 MED ORDER — ONDANSETRON HCL 4 MG PO TABS: 4.0000 mg | ORAL_TABLET | Freq: Four times a day (QID) | ORAL | Status: DC | PRN

## 2013-04-15 MED ORDER — ENOXAPARIN SODIUM 40 MG/0.4ML ~~LOC~~ SOLN
40.0000 mg | SUBCUTANEOUS | Status: DC
  Administered 2013-04-16: 40 mg via SUBCUTANEOUS
  Filled 2013-04-15 (×2): qty 0.4

## 2013-04-15 MED ORDER — ONDANSETRON HCL 4 MG/2ML IJ SOLN: 4.0000 mg | Freq: Four times a day (QID) | INTRAMUSCULAR | Status: DC | PRN

## 2013-04-15 MED ORDER — COLCHICINE 0.6 MG PO CAPS: 1.0000 | ORAL_CAPSULE | Freq: Every day | ORAL | Status: DC

## 2013-04-15 MED ORDER — KETOROLAC TROMETHAMINE 30 MG/ML IJ SOLN
INTRAMUSCULAR | Status: DC | PRN
  Administered 2013-04-15: 30 mg via INTRAVENOUS

## 2013-04-15 MED ORDER — ONDANSETRON HCL 4 MG/2ML IJ SOLN
INTRAMUSCULAR | Status: AC
  Filled 2013-04-15: qty 2

## 2013-04-15 MED ORDER — OXYCODONE HCL 5 MG PO TABS
ORAL_TABLET | ORAL | Status: AC
  Filled 2013-04-15: qty 1

## 2013-04-15 MED ORDER — ROCURONIUM BROMIDE 50 MG/5ML IV SOLN
INTRAVENOUS | Status: AC
Start: 2013-04-15 — End: 2013-04-15
  Filled 2013-04-15: qty 1

## 2013-04-15 MED ORDER — NEOSTIGMINE METHYLSULFATE 1 MG/ML IJ SOLN
INTRAMUSCULAR | Status: AC
  Filled 2013-04-15: qty 10

## 2013-04-15 MED ORDER — ROCURONIUM BROMIDE 100 MG/10ML IV SOLN
INTRAVENOUS | Status: DC | PRN
  Administered 2013-04-15: 50 mg via INTRAVENOUS

## 2013-04-15 MED ORDER — PROPOFOL 10 MG/ML IV BOLUS
INTRAVENOUS | Status: AC
  Filled 2013-04-15: qty 20

## 2013-04-15 MED ORDER — HYDROMORPHONE HCL PF 1 MG/ML IJ SOLN: 0.5000 mg | INTRAMUSCULAR | Status: DC | PRN

## 2013-04-15 MED ORDER — KETOROLAC TROMETHAMINE 30 MG/ML IJ SOLN
30.0000 mg | Freq: Four times a day (QID) | INTRAMUSCULAR | Status: DC
  Administered 2013-04-15 – 2013-04-16 (×3): 30 mg via INTRAVENOUS
  Filled 2013-04-15 (×10): qty 1

## 2013-04-15 MED ORDER — HYDROMORPHONE HCL PF 1 MG/ML IJ SOLN
INTRAMUSCULAR | Status: AC
  Administered 2013-04-15: 0.5 mg via INTRAVENOUS
  Filled 2013-04-15: qty 1

## 2013-04-15 MED ORDER — ONDANSETRON HCL 4 MG/2ML IJ SOLN
4.0000 mg | Freq: Once | INTRAMUSCULAR | Status: AC
  Administered 2013-04-15: 4 mg via INTRAVENOUS
  Filled 2013-04-15: qty 2

## 2013-04-15 MED ORDER — SUCCINYLCHOLINE CHLORIDE 20 MG/ML IJ SOLN
INTRAMUSCULAR | Status: AC
  Filled 2013-04-15: qty 1

## 2013-04-15 MED ORDER — METOCLOPRAMIDE HCL 5 MG/ML IJ SOLN
INTRAMUSCULAR | Status: AC
  Filled 2013-04-15: qty 2

## 2013-04-15 MED ORDER — ARTIFICIAL TEARS OP OINT
TOPICAL_OINTMENT | OPHTHALMIC | Status: DC | PRN
  Administered 2013-04-15: 1 via OPHTHALMIC

## 2013-04-15 MED ORDER — NEOSTIGMINE METHYLSULFATE 1 MG/ML IJ SOLN
INTRAMUSCULAR | Status: DC | PRN
  Administered 2013-04-15: 5.6 mg via INTRAVENOUS

## 2013-04-15 MED ORDER — KETOROLAC TROMETHAMINE 30 MG/ML IJ SOLN
INTRAMUSCULAR | Status: AC
  Filled 2013-04-15: qty 1

## 2013-04-15 MED ORDER — SODIUM CHLORIDE 0.9 % IV BOLUS (SEPSIS)
1000.0000 mL | Freq: Once | INTRAVENOUS | Status: AC
  Administered 2013-04-15: 1000 mL via INTRAVENOUS

## 2013-04-15 MED ORDER — POTASSIUM CHLORIDE IN NACL 20-0.9 MEQ/L-% IV SOLN
INTRAVENOUS | Status: DC
  Administered 2013-04-15: 18:00:00 via INTRAVENOUS
  Filled 2013-04-15 (×2): qty 1000

## 2013-04-15 MED ORDER — COLCHICINE 0.6 MG PO TABS
0.6000 mg | ORAL_TABLET | Freq: Every day | ORAL | Status: DC
  Administered 2013-04-15 – 2013-04-16 (×2): 0.6 mg via ORAL
  Filled 2013-04-15 (×2): qty 1

## 2013-04-15 MED ORDER — HYDROMORPHONE HCL PF 1 MG/ML IJ SOLN
0.2500 mg | INTRAMUSCULAR | Status: DC | PRN
  Administered 2013-04-15 (×4): 0.5 mg via INTRAVENOUS

## 2013-04-15 MED ORDER — ROCURONIUM BROMIDE 50 MG/5ML IV SOLN
INTRAVENOUS | Status: AC
  Filled 2013-04-15: qty 1

## 2013-04-15 MED ORDER — BUPIVACAINE-EPINEPHRINE (PF) 0.25% -1:200000 IJ SOLN
INTRAMUSCULAR | Status: AC
  Filled 2013-04-15: qty 30

## 2013-04-15 MED ORDER — BUPIVACAINE-EPINEPHRINE 0.25% -1:200000 IJ SOLN
INTRAMUSCULAR | Status: DC | PRN
  Administered 2013-04-15: 18 mL

## 2013-04-15 MED ORDER — GLYCOPYRROLATE 0.2 MG/ML IJ SOLN
INTRAMUSCULAR | Status: AC
  Filled 2013-04-15: qty 4

## 2013-04-15 MED ORDER — VENLAFAXINE HCL ER 150 MG PO CP24
150.0000 mg | ORAL_CAPSULE | Freq: Every day | ORAL | Status: DC
  Administered 2013-04-16: 150 mg via ORAL
  Filled 2013-04-15 (×3): qty 1

## 2013-04-15 MED ORDER — FLUTICASONE PROPIONATE 50 MCG/ACT NA SUSP: 1.0000 | Freq: Every day | NASAL | Status: DC | PRN

## 2013-04-15 MED ORDER — HYDROMORPHONE HCL PF 1 MG/ML IJ SOLN
1.0000 mg | Freq: Once | INTRAMUSCULAR | Status: AC
Start: 2013-04-15 — End: 2013-04-15
  Administered 2013-04-15: 1 mg via INTRAVENOUS
  Filled 2013-04-15: qty 1

## 2013-04-15 MED ORDER — LACTATED RINGERS IV SOLN
INTRAVENOUS | Status: DC | PRN
  Administered 2013-04-15 (×2): via INTRAVENOUS

## 2013-04-15 MED ORDER — SODIUM CHLORIDE 0.9 % IJ SOLN
INTRAMUSCULAR | Status: AC
Start: 2013-04-15 — End: 2013-04-15
  Filled 2013-04-15: qty 10

## 2013-04-15 MED ORDER — 0.9 % SODIUM CHLORIDE (POUR BTL) OPTIME
TOPICAL | Status: DC | PRN
  Administered 2013-04-15: 1000 mL

## 2013-04-15 MED ORDER — FENTANYL CITRATE 0.05 MG/ML IJ SOLN
INTRAMUSCULAR | Status: AC
  Filled 2013-04-15: qty 5

## 2013-04-15 MED ORDER — PROPOFOL 10 MG/ML IV BOLUS
INTRAVENOUS | Status: DC | PRN
  Administered 2013-04-15: 200 mg via INTRAVENOUS

## 2013-04-15 MED ORDER — GABAPENTIN 100 MG PO CAPS
200.0000 mg | ORAL_CAPSULE | Freq: Three times a day (TID) | ORAL | Status: DC
  Administered 2013-04-15 – 2013-04-16 (×4): 200 mg via ORAL
  Filled 2013-04-15 (×5): qty 2

## 2013-04-15 MED ORDER — ONDANSETRON HCL 4 MG/2ML IJ SOLN
4.0000 mg | INTRAMUSCULAR | Status: DC | PRN
  Administered 2013-04-15: 4 mg via INTRAVENOUS

## 2013-04-15 MED ORDER — ATENOLOL 100 MG PO TABS
100.0000 mg | ORAL_TABLET | Freq: Every day | ORAL | Status: DC
  Administered 2013-04-16: 100 mg via ORAL
  Filled 2013-04-15: qty 1

## 2013-04-15 MED ORDER — PHENYLEPHRINE 40 MCG/ML (10ML) SYRINGE FOR IV PUSH (FOR BLOOD PRESSURE SUPPORT)
PREFILLED_SYRINGE | INTRAVENOUS | Status: AC
  Filled 2013-04-15: qty 10

## 2013-04-15 MED ORDER — OXYCODONE HCL 5 MG PO TABS
5.0000 mg | ORAL_TABLET | Freq: Once | ORAL | Status: AC | PRN
  Administered 2013-04-15: 5 mg via ORAL

## 2013-04-15 MED ORDER — MIDAZOLAM HCL 2 MG/2ML IJ SOLN
INTRAMUSCULAR | Status: AC
  Filled 2013-04-15: qty 2

## 2013-04-15 MED ORDER — LIDOCAINE HCL (CARDIAC) 20 MG/ML IV SOLN
INTRAVENOUS | Status: AC
  Filled 2013-04-15: qty 5

## 2013-04-15 SURGICAL SUPPLY — 63 items
ADH SKN CLS APL DERMABOND .7 (GAUZE/BANDAGES/DRESSINGS)
APL SKNCLS STERI-STRIP NONHPOA (GAUZE/BANDAGES/DRESSINGS) ×3
BANDAGE ADHESIVE 1X3 (GAUZE/BANDAGES/DRESSINGS) ×6 IMPLANT
BENZOIN TINCTURE PRP APPL 2/3 (GAUZE/BANDAGES/DRESSINGS) ×3 IMPLANT
BLADE SURG ROTATE 9660 (MISCELLANEOUS) ×3 IMPLANT
CANISTER SUCTION 2500CC (MISCELLANEOUS) ×5 IMPLANT
CHLORAPREP W/TINT 26ML (MISCELLANEOUS) ×5 IMPLANT
COVER SURGICAL LIGHT HANDLE (MISCELLANEOUS) ×5 IMPLANT
DECANTER SPIKE VIAL GLASS SM (MISCELLANEOUS) ×4 IMPLANT
DERMABOND ADVANCED (GAUZE/BANDAGES/DRESSINGS)
DERMABOND ADVANCED .7 DNX12 (GAUZE/BANDAGES/DRESSINGS) ×2 IMPLANT
DRAPE LAPAROSCOPIC ABDOMINAL (DRAPES) ×2 IMPLANT
DRAPE UTILITY 15X26 W/TAPE STR (DRAPE) ×10 IMPLANT
DRAPE WARM FLUID 44X44 (DRAPE) ×2 IMPLANT
DRSG TEGADERM 4X4.75 (GAUZE/BANDAGES/DRESSINGS) ×3 IMPLANT
ELECT CAUTERY BLADE 6.4 (BLADE) ×3 IMPLANT
ELECT REM PT RETURN 9FT ADLT (ELECTROSURGICAL) ×5
ELECTRODE REM PT RTRN 9FT ADLT (ELECTROSURGICAL) ×3 IMPLANT
GAUZE SPONGE 2X2 8PLY NS (GAUZE/BANDAGES/DRESSINGS) ×3 IMPLANT
GLOVE BIO SURGEON STRL SZ7.5 (GLOVE) ×8 IMPLANT
GLOVE BIOGEL PI IND STRL 6.5 (GLOVE) ×2 IMPLANT
GLOVE BIOGEL PI IND STRL 7.0 (GLOVE) ×1 IMPLANT
GLOVE BIOGEL PI IND STRL 7.5 (GLOVE) ×4 IMPLANT
GLOVE BIOGEL PI INDICATOR 6.5 (GLOVE) ×4
GLOVE BIOGEL PI INDICATOR 7.0 (GLOVE) ×2
GLOVE BIOGEL PI INDICATOR 7.5 (GLOVE) ×4
GLOVE SURG SS PI 7.0 STRL IVOR (GLOVE) ×3 IMPLANT
GOWN STRL NON-REIN LRG LVL3 (GOWN DISPOSABLE) ×15 IMPLANT
GOWN STRL REIN XL XLG (GOWN DISPOSABLE) ×5 IMPLANT
KIT BASIN OR (CUSTOM PROCEDURE TRAY) ×5 IMPLANT
KIT ROOM TURNOVER OR (KITS) ×5 IMPLANT
MESH ULTRAPRO 3X6 7.6X15CM (Mesh General) ×3 IMPLANT
NS IRRIG 1000ML POUR BTL (IV SOLUTION) ×5 IMPLANT
PACK GENERAL/GYN (CUSTOM PROCEDURE TRAY) ×2 IMPLANT
PAD ARMBOARD 7.5X6 YLW CONV (MISCELLANEOUS) ×10 IMPLANT
PENCIL BUTTON HOLSTER BLD 10FT (ELECTRODE) ×3 IMPLANT
RELOAD STAPLE 4.0 BLU F/HERNIA (INSTRUMENTS) IMPLANT
RELOAD STAPLE HERNIA 4.0 BLUE (INSTRUMENTS) ×10 IMPLANT
SCALPEL HARMONIC ACE (MISCELLANEOUS) IMPLANT
SCISSORS LAP 5X35 DISP (ENDOMECHANICALS) ×3 IMPLANT
SET IRRIG TUBING LAPAROSCOPIC (IRRIGATION / IRRIGATOR) IMPLANT
SLEEVE ENDOPATH XCEL 5M (ENDOMECHANICALS) ×5 IMPLANT
SPONGE GAUZE 4X4 12PLY (GAUZE/BANDAGES/DRESSINGS) IMPLANT
STAPLER HERNIA 12 8.5 360D (INSTRUMENTS) ×3 IMPLANT
STAPLER VISISTAT 35W (STAPLE) IMPLANT
SUT MNCRL AB 4-0 PS2 18 (SUTURE) ×5 IMPLANT
SUT NOVA NAB GS-21 0 18 T12 DT (SUTURE) IMPLANT
SUT NOVA NAB GS-21 1 T12 (SUTURE) IMPLANT
SUT VIC AB 3-0 SH 27 (SUTURE)
SUT VIC AB 3-0 SH 27XBRD (SUTURE) ×2 IMPLANT
SUT VICRYL 0 UR6 27IN ABS (SUTURE) ×3 IMPLANT
SYR BULB 3OZ (MISCELLANEOUS) ×3 IMPLANT
TOWEL OR 17X24 6PK STRL BLUE (TOWEL DISPOSABLE) ×2 IMPLANT
TOWEL OR 17X26 10 PK STRL BLUE (TOWEL DISPOSABLE) ×5 IMPLANT
TRAY FOLEY CATH 14FRSI W/METER (CATHETERS) ×3 IMPLANT
TRAY LAPAROSCOPIC (CUSTOM PROCEDURE TRAY) ×5 IMPLANT
TROCAR XCEL BLUNT TIP 100MML (ENDOMECHANICALS) ×3 IMPLANT
TROCAR XCEL NON-BLD 11X100MML (ENDOMECHANICALS) IMPLANT
TROCAR XCEL NON-BLD 5MMX100MML (ENDOMECHANICALS) ×5 IMPLANT
TUBE CONNECTING 12'X1/4 (SUCTIONS) ×1
TUBE CONNECTING 12X1/4 (SUCTIONS) ×2 IMPLANT
WATER STERILE IRR 1000ML POUR (IV SOLUTION) IMPLANT
YANKAUER SUCT BULB TIP NO VENT (SUCTIONS) ×3 IMPLANT

## 2013-04-15 NOTE — ED Provider Notes (Signed)
CSN: [id number]     Arrival date & time 04/15/13  1025 History   First MD Initiated Contact with Patient 04/15/13 1037     Chief Complaint  Patient presents with  . Abdominal Pain     (Consider location/radiation/quality/duration/timing/severity/associated sxs/prior Treatment) HPI Comments: Patient presents with a three-day history of left groin pain radiating to his testicle. He has history of hernia repair 30 years ago. He has nausea but no vomiting. He denies fever. He has been constant since yesterday. Denies any injury. No diarrhea. No chest pain or shortness of breath. Nothing makes the pain better or worse. He still able to urinate.  The history is provided by the patient.    Past Medical History  Diagnosis Date  . Shortness of breath     with exertion  . Anxiety   . Hypertension   . GERD (gastroesophageal reflux disease)   . Fibromyalgia   . Chronic low back pain   . Arthritis     shoulders, both knees, arms  . Dizziness - light-headed   . Balance problem   . Fall   . Acute URI in September with cough  . Heartburn 01/10/2011  . Sleep apnea     occ cpap   Past Surgical History  Procedure Laterality Date  . Back surgery      lower back sx, neck sx  3x,  . Tonsillectomy    . Appendectomy    . Hernia repair      both  . Shoulder arthroscopy      both  . Knee arthroscopy      both  . Carpal tunnel release      both  . Hand sx      left  . Polypectomy  01/10/2011    Procedure: POLYPECTOMY;  Surgeon: Lear Ng, MD;  Location: [facility];  Service: Endoscopy;  Laterality: N/A;  . Flexible sigmoidoscopy  12/21/2011    Procedure: FLEXIBLE SIGMOIDOSCOPY;  Surgeon: Lear Ng, MD;  Location: [facility];  Service: Endoscopy;  Laterality: N/A;  . Hot hemostasis  12/21/2011    Procedure: HOT HEMOSTASIS (ARGON PLASMA COAGULATION/BICAP);  Surgeon: Lear Ng, MD;  Location: [facility];  Service: Endoscopy;  Laterality: N/A;  . Vasectomy      Family History  Problem Relation Age of Onset  . Hypertension Father    History  Substance Use Topics  . Smoking status: Former Smoker -- 0.20 packs/day for 2 years    Types: Cigarettes    Quit date: 01/08/2010  . Smokeless tobacco: Not on file  . Alcohol Use: Yes     Comment: occasional drink    Review of Systems  Constitutional: Positive for activity change and appetite change. Negative for fever.  HENT: Negative for congestion and rhinorrhea.   Eyes: Negative for visual disturbance.  Respiratory: Negative for cough, chest tightness and shortness of breath.   Cardiovascular: Negative for chest pain.  Gastrointestinal: Positive for nausea and abdominal pain. Negative for vomiting.  Genitourinary: Negative for dysuria and hematuria.  Musculoskeletal: Negative for back pain.  Skin: Negative for rash.  Neurological: Negative for dizziness, weakness and headaches.  A complete 10 system review of systems was obtained and all systems are negative except as noted in the HPI and PMH.      Allergies  Flexeril and Tetracyclines & related  Home Medications   Current Outpatient Rx  Name  Route  Sig  Dispense  Refill  . allopurinol (ZYLOPRIM) 300 MG tablet  Oral   Take 300 mg by mouth daily.          Marland Kitchen ALPRAZolam (XANAX) 1 MG tablet   Oral   Take 1 mg by mouth every 8 (eight) hours as needed for anxiety.          Marland Kitchen aspirin EC 81 MG tablet   Oral   Take 81 mg by mouth daily.         Marland Kitchen atenolol (TENORMIN) 100 MG tablet   Oral   Take 100 mg by mouth daily.           . calcium carbonate 200 MG capsule   Oral   Take 250 mg by mouth 2 (two) times daily with a meal. With D 3         . Colchicine 0.6 MG CAPS   Oral   Take 1 tablet by mouth daily.         . diazepam (VALIUM) 5 MG tablet   Oral   Take 5 mg by mouth every 8 (eight) hours as needed for anxiety.          . fish oil-omega-3 fatty acids 1000 MG capsule   Oral   Take 1 g by mouth 2 (two)  times daily.          . fluticasone (FLONASE) 50 MCG/ACT nasal spray   Each Nare   Place 1 spray into both nostrils daily as needed. For congestion         . gabapentin (NEURONTIN) 100 MG capsule   Oral   Take 200 mg by mouth 3 (three) times daily.           Marland Kitchen lisinopril (PRINIVIL,ZESTRIL) 5 MG tablet   Oral   Take 5 mg by mouth daily.          . Misc Natural Products (GINSENG COMPLEX PO)   Oral   Take 1 capsule by mouth daily.          . Multiple Vitamins-Minerals (MULTIVITAMIN WITH MINERALS) tablet   Oral   Take 1 tablet by mouth daily.           Marland Kitchen omeprazole (PRILOSEC) 40 MG capsule   Oral   Take 40 mg by mouth daily.         Marland Kitchen OVER THE COUNTER MEDICATION   Oral   Take 1 each by mouth daily. Over the counter protein supplement         . testosterone cypionate (DEPOTESTOTERONE CYPIONATE) 200 MG/ML injection   Intramuscular   Inject 200 mg into the muscle every 21 ( twenty-one) days.          Marland Kitchen UNABLE TO FIND   Oral   Take 1 tablet by mouth 2 (two) times daily. instaflex tablet for muscle pain, daily         . venlafaxine (EFFEXOR-XR) 150 MG 24 hr capsule   Oral   Take 150 mg by mouth daily.           Marland Kitchen VITAMIN E COMPLEX PO   Oral   Take 1 capsule by mouth daily.          Marland Kitchen zolpidem (AMBIEN CR) 12.5 MG CR tablet   Oral   Take 12.5 mg by mouth at bedtime as needed for sleep.           BP 144/111  Pulse 100  Temp(Src) 97.8 F (36.6 C)  Resp 18  SpO2 93% Physical Exam  Constitutional: He is oriented  to person, place, and time. He appears well-developed and well-nourished. He appears distressed.  HENT:  Head: Normocephalic and atraumatic.  Mouth/Throat: Oropharynx is clear and moist. No oropharyngeal exudate.  Eyes: Conjunctivae and EOM are normal. Pupils are equal, round, and reactive to light.  Neck: Normal range of motion. Neck supple.  Cardiovascular: Normal rate, regular rhythm and normal heart sounds.   No murmur  heard. Pulmonary/Chest: Effort normal and breath sounds normal. No respiratory distress.  Abdominal: There is tenderness. There is no rebound and no guarding.  Genitourinary:  Firm bulge in L inguinal canal. Testicles appear to be high, nontender  Musculoskeletal: Normal range of motion. He exhibits no edema and no tenderness.  Neurological: He is alert and oriented to person, place, and time. No cranial nerve deficit. He exhibits normal muscle tone. Coordination normal.  Skin: Skin is warm.    ED Course  Procedures (including critical care time) Labs Review Labs Reviewed  CBC WITH DIFFERENTIAL - Abnormal; Notable for the following:    Hemoglobin 17.2 (*)    All other components within normal limits  COMPREHENSIVE METABOLIC PANEL  LIPASE, BLOOD  URINALYSIS, ROUTINE W REFLEX MICROSCOPIC  CG4 I-STAT (LACTIC ACID)   Imaging Review No results found.  EKG Interpretation   None       MDM   Final diagnoses:  Incarcerated inguinal hernia   Left inguinal pain for the past 3 days associated with nausea. Patient is in a lot of pain and distressed.  Concern for incarcerated inguinal hernia. Unable to reduce.  Discuss with surgery. Dr. Redmond Pulling has seen the patient and will take him to the OR.    Ezequiel Essex, MD 04/15/13 [phone]

## 2013-04-15 NOTE — Transfer of Care (Signed)
Immediate Anesthesia Transfer of Care Note  Patient: Victor [name]  Procedure(s) Performed: Procedure(s): LAPAROSCOPY DIAGNOSTIC (N/A) LAPAROSCOPIC REPAIR OF INCARCERATED LEFT INGUINAL HERNIA (Left) INSERTION OF MESH (Left)  Patient Location: PACU  Anesthesia Type:General  Level of Consciousness: awake, alert  and oriented  Airway & Oxygen Therapy: Patient Spontanous Breathing and Patient connected to face mask oxygen  Post-op Assessment: Report given to PACU RN, Post -op Vital signs reviewed and stable and Patient moving all extremities X 4  Post vital signs: Reviewed and stable  Complications: No apparent anesthesia complications

## 2013-04-15 NOTE — Anesthesia Procedure Notes (Signed)
Procedure Name: Intubation Date/Time: 04/15/2013 12:21 PM Performed by: Neldon Newport Pre-anesthesia Checklist: Patient identified, Timeout performed, Emergency Drugs available, Suction available and Patient being monitored Patient Re-evaluated:Patient Re-evaluated prior to inductionOxygen Delivery Method: Circle system utilized Preoxygenation: Pre-oxygenation with 100% oxygen Intubation Type: IV induction, Rapid sequence and Cricoid Pressure applied Laryngoscope Size: Mac and 3 Grade View: Grade II Tube type: Oral Tube size: 7.5 mm Number of attempts: 1 Placement Confirmation: positive ETCO2,  ETT inserted through vocal cords under direct vision and breath sounds checked- equal and bilateral Secured at: 23 cm Tube secured with: Tape Dental Injury: Teeth and Oropharynx as per pre-operative assessment

## 2013-04-15 NOTE — ED Notes (Signed)
Lactic acid results called to primary nurse Gerald Stabs

## 2013-04-15 NOTE — H&P (Signed)
Victor [name] 1955-07-17  [id number].   Chief Complaint/Reason for Consult: incarcerated left inguinal hernia HPI: this is a 58 yo white male who has a history of bilateral IH repairs in the past, 30 yrs ago.  He has done well since then.  He hasn't noticed any bulges until this weekend.  He has a dull ache on Friday and the intermittent coming and going type pain.  He denies any nausea or vomiting.  He had a normal BM this morning.  His pain began to increase on Sunday and finally today came to the [facility] for evaluation.  He was noted to have an incarcerated left inguinal hernia and we were called to see him.  ROS: Please see HPI, otherwise negative except for some mild discomfort with urinating.  Family History  Problem Relation Age of Onset  . Hypertension Father     Past Medical History  Diagnosis Date  . Shortness of breath     with exertion  . Anxiety   . Hypertension   . GERD (gastroesophageal reflux disease)   . Fibromyalgia   . Chronic low back pain   . Arthritis     shoulders, both knees, arms  . Dizziness - light-headed   . Balance problem   . Fall   . Acute URI in September with cough  . Heartburn 01/10/2011  . Sleep apnea     occ cpap    Past Surgical History  Procedure Laterality Date  . Back surgery      lower back sx, neck sx  3x,  . Tonsillectomy    . Appendectomy    . Hernia repair      both  . Shoulder arthroscopy      both  . Knee arthroscopy      both  . Carpal tunnel release      both  . Hand sx      left  . Polypectomy  01/10/2011    Procedure: POLYPECTOMY;  Surgeon: Lear Ng, MD;  Location: [facility];  Service: Endoscopy;  Laterality: N/A;  . Flexible sigmoidoscopy  12/21/2011    Procedure: FLEXIBLE SIGMOIDOSCOPY;  Surgeon: Lear Ng, MD;  Location: [facility];  Service: Endoscopy;  Laterality: N/A;  . Hot hemostasis  12/21/2011    Procedure: HOT HEMOSTASIS (ARGON PLASMA COAGULATION/BICAP);  Surgeon: Lear Ng, MD;  Location: [facility];  Service: Endoscopy;  Laterality: N/A;  . Vasectomy      Social History:  reports that he quit smoking about 3 years ago. His smoking use included Cigarettes. He has a .4 pack-year smoking history. He does not have any smokeless tobacco history on file. He reports that he drinks alcohol. He reports that he does not use illicit drugs.  Allergies:  Allergies  Allergen Reactions  . Flexeril [Cyclobenzaprine] Other (See Comments)    Makes patient irritable  . Tetracyclines & Related Other (See Comments)    " makes me angry"     (Not in a hospital admission)  Blood pressure 144/111, pulse 100, temperature 97.8 F (36.6 C), resp. rate 18, SpO2 93.00%. Physical Exam: General: pleasant, WD, WN white male who is laying in bed in mild distress secondary to pain HEENT: head is normocephalic, atraumatic.  Sclera are noninjected.  PERRL.  Ears and nose without any masses or lesions.  Mouth is pink and moist Heart: regular, rate, and rhythm.  Normal s1,s2. No obvious murmurs, gallops, or rubs noted.  Palpable radial and pedal pulses bilaterally Lungs:  CTAB, no wheezes, rhonchi, or rales noted.  Respiratory effort nonlabored Abd: soft, NT, ND, +BS, no masses, hernias, or organomegaly GU: incarcerated left inguinal hernia, very tender to palpation, unable to reduce, normal penis and testicles with no penile discharge MS: all 4 extremities are symmetrical with no cyanosis, clubbing, or edema. Skin: warm and dry with no masses, lesions, or rashes Psych: A&Ox3 with an appropriate affect.    Results for orders placed during the hospital encounter of 04/15/13 (from the past 48 hour(s))  CBC WITH DIFFERENTIAL     Status: Abnormal   Collection Time    04/15/13 10:30 AM      Result Value Ref Range   WBC 7.0  4.0 - 10.5 K/uL   RBC 5.27  4.22 - 5.81 MIL/uL   Hemoglobin 17.2 (*) 13.0 - 17.0 g/dL   HCT 48.8  39.0 - 52.0 %   MCV 92.6  78.0 - 100.0 fL   MCH 32.6   26.0 - 34.0 pg   MCHC 35.2  30.0 - 36.0 g/dL   RDW 14.6  11.5 - 15.5 %   Platelets 200  150 - 400 K/uL   Neutrophils Relative % 63  43 - 77 %   Neutro Abs 4.4  1.7 - 7.7 K/uL   Lymphocytes Relative 23  12 - 46 %   Lymphs Abs 1.6  0.7 - 4.0 K/uL   Monocytes Relative 11  3 - 12 %   Monocytes Absolute 0.8  0.1 - 1.0 K/uL   Eosinophils Relative 2  0 - 5 %   Eosinophils Absolute 0.2  0.0 - 0.7 K/uL   Basophils Relative 0  0 - 1 %   Basophils Absolute 0.0  0.0 - 0.1 K/uL  CG4 I-STAT (LACTIC ACID)     Status: None   Collection Time    04/15/13 11:05 AM      Result Value Ref Range   Lactic Acid, Venous 1.41  0.5 - 2.2 mmol/L   No results found.     Assessment/Plan 1. Incarcerated left inguinal hernia 2. Polycythemia vera 3. Chronic back pain 4. Chronic sinus problems 5. HTN 6. Gout 7. Anxiety  Plan: 1. We are unable to reduce his hernia.  We will plan for diagnostic laparoscopy to evaluate his bowel and then an open inguinal hernia repair.  Dr. Redmond Pulling has explained the procedure along with risks, complications, and expected outcomes with the patient and his wife.  They understand and are agreeable to proceed with surgery.  He is NPO.  He will be given an prophylactic dose of Cefoxitin oncall to the OR.   Cairo Lingenfelter E 04/15/2013, 11:23 AM Pager: [phone]

## 2013-04-15 NOTE — H&P (Signed)
I saw the patient, participated in the history, exam and medical decision making, and concur with the physician assistant's note above. Cardiac and pulm ROS completely negative other chronic sinus drainage. Doesn't wear CPAP  Alert, uncomfortable cta b/l Reg Obese, soft, TTP in RLQ/LLQ; obvious bulge in L groin. After pain meds unable to reduce. No RT/peritonitis. Well healed b/l groin incisions and trocar site from appy  Incarcerated Recurrent Left Inguinal Hernia Obesity OSA Gout Anxiety Polycythemia vera   To OR for diagnostic laparoscopy, open repair of recurrent incarcerated LIH with mesh   I described the procedure in detail.  We discussed the risks and benefits including but not limited to bleeding, infection, chronic inguinal pain, nerve entrapment, hernia recurrence, mesh complications, hematoma formation, urinary retention, injury to the testicles, numbness in the groin, blood clots, injury to the surrounding structures, and anesthesia risk. We also discussed the typical post operative recovery course, including no heavy lifting for 6 weeks. I explained that the likelihood of improvement of their symptoms is good  Since urgent, explained he is at higher risk for infection, recurrence, mesh complications, injury to surrounding structures, possible bowel resection with associated risks of anastomosis if have.   Will start laparoscopically to evaluate intestine and reduce and then proceed open in order to minimize mesh infection.    Victor [name]. Redmond Pulling, MD, FACS General, Bariatric, & Minimally Invasive Surgery [facility], [place]

## 2013-04-15 NOTE — Anesthesia Preprocedure Evaluation (Addendum)
Anesthesia Evaluation  Patient identified by MRN, date of birth, ID band Patient awake    Reviewed: Allergy & Precautions, H&P , NPO status , Patient's Chart, lab work & pertinent test results, reviewed documented beta blocker date and time   Airway Mallampati: II TM Distance: >3 FB Neck ROM: full    Dental  (+) Partial Upper, Teeth Intact, Dental Advidsory Given   Pulmonary shortness of breath and with exertion, sleep apnea , former smoker,          Cardiovascular hypertension,     Neuro/Psych  Neuromuscular disease    GI/Hepatic GERD-  Medicated and Controlled,  Endo/Other    Renal/GU      Musculoskeletal   Abdominal   Peds  Hematology   Anesthesia Other Findings   Reproductive/Obstetrics                         Anesthesia Physical Anesthesia Plan  ASA: II  Anesthesia Plan: General   Post-op Pain Management:    Induction: Intravenous  Airway Management Planned: Oral ETT  Additional Equipment:   Intra-op Plan:   Post-operative Plan: Extubation in OR  Informed Consent: I have reviewed the patients History and Physical, chart, labs and discussed the procedure including the risks, benefits and alternatives for the proposed anesthesia with the patient or authorized representative who has indicated his/her understanding and acceptance.   Dental Advisory Given  Plan Discussed with: Anesthesiologist, CRNA and Surgeon  Anesthesia Plan Comments:        Anesthesia Quick Evaluation

## 2013-04-15 NOTE — Anesthesia Postprocedure Evaluation (Signed)
Anesthesia Post Note  Patient: Victor [name]  Procedure(s) Performed: Procedure(s) (LRB): LAPAROSCOPY DIAGNOSTIC (N/A) LAPAROSCOPIC REPAIR OF INCARCERATED LEFT INGUINAL HERNIA (Left) INSERTION OF MESH (Left)  Anesthesia type: General  Patient location: PACU  Post pain: Pain level controlled and Adequate analgesia  Post assessment: Post-op Vital signs reviewed, Patient's Cardiovascular Status Stable, Respiratory Function Stable, Patent Airway and Pain level controlled  Last Vitals:  Filed Vitals:   04/15/13 1500  BP:   Pulse: 70  Temp:   Resp: 13    Post vital signs: Reviewed and stable  Level of consciousness: awake, alert  and oriented  Complications: No apparent anesthesia complications

## 2013-04-15 NOTE — Op Note (Signed)
04/15/2013  Victor [name] 02-06-1956   PREOPERATIVE DIAGNOSIS: recurrent left incarcerated inguinal hernia.   POSTOPERATIVE DIAGNOSIS: recurrent left incarcerated direct inguinal hernia.   PROCEDURE: Laparoscopic repair of recurrent left incarcerated direct inguinal hernia with  mesh (TAPP).   SURGEON: Leighton Ruff. Redmond Pulling, MD   ASSISTANT SURGEON: None.   ANESTHESIA: General plus local consisting of 0.25% Marcaine with epi.   ESTIMATED BLOOD LOSS: Minimal.   FINDINGS: The patient had a recurrent direct hernia on left - preperitoneal fat - not indurated.  It was repaired using a 3 inch x 6  inch piece of Ethicon UltraPro mesh.   SPECIMEN: none  INDICATIONS FOR PROCEDURE: 58 year old Caucasian male with a history of bilateral inguinal hernia repairs many years ago comes in complaining of intermittent groin pain since Friday. The pain acutely worsened yesterday but only came to the emergency room this morning. He complains of bilateral lower abdominal pain. There has been a constant bulge in the groin since the weekend. It is very tender to touch. We were unable to reduce his left incarcerated inguinal hernia in the emergency room. I recommended urgent trip to the operating room for diagnostic laparoscopy and repair of his incarcerated inguinal hernia. The risks and benefits including but not limited to bleeding, infection, chronic inguinal pain, nerve entrapment, hernia recurrence, mesh complications, hematoma formation, urinary retention, injury to the testicles or the ovaries, numbness in the groin, blood clots, injury to the surrounding structures, and anesthesia risk was discussed with the patient.  DESCRIPTION OF PROCEDURE: After obtaining verbal consent and marking  the left groin in the holding area with the patient confirming the  operative site, the patient was then taken back to the operating room, placed  supine on the operating room table. General endotracheal anesthesia was   established. A Foley catheter was placed. Sequential compression devices were placed. The  abdomen and groin were prepped and draped in the usual standard surgical  fashion with ChloraPrep. The patient received IV  antibiotics prior to the incision. A surgical time-out was performed.  Local was infiltrated at above his umbilicus thru an old trocar site.  Next, a 1-cm vertical infraumbilical incision was made with a #11 blade. The fascia  was grasped and lifted anteriorly. Next, the fascia was incised, and  the abdominal cavity was entered. Pursestring suture was placed around  the fascial edges using a 0 Vicryl. A 12-mm Hasson trocar was placed.  Pneumoperitoneum was smoothly established up to a patient pressure of 15  mmHg. Laparoscope was advanced. There was no evidence of a  contralateral hernia. The patient had a defect medial to  the inferior epigastric vessel, consistent with an recurrent left incarcerated direct  hernia. Two 5-mm trocars were placed, one on the right, one on the left  in the midclavicular line slightly above the level of the umbilicus all  under direct visualization After local had been infiltrated, Part of his median umbilical ligament was stuck within the defect. I was able to reduce it using traction and countertraction.  I then  made incision along the peritoneum on the left, starting 2 inches above  the anterior superior iliac spine and caring it medial  toward the median umbilical ligament in a lazy S configuration using  Endo Shears with electrocautery. The peritoneal flap was then gently  dissected downward from the anterior abdominal wall taking care not to  injure the inferior epigastric vessels. The pubic bone was identified.  The testicular vessels were identified. There is  a large plug of preperitoneal fat stuck within the direct hernia. I was able to eventually reduce it in its entirety. There is evidence of an old suture lateral to the direct defect.   Using  traction and counter traction with short graspers, IThe testicular vessels had been identified and preserved. The vas deferens was identified and preserved, and the hernia sac was stripped from those to  surrounding structures. I then went about creating a large pocket by  lifting the peritoneum of the pelvic floor. I took great care not to  injure the iliac vessels.   Local anesthetic was injected 2 finger breadths below and medial to the anterior superior iliac spine as well as along the left groin prior to placing the mesh. I then obtained a piece of Ethicon UltraPro mesh 3 inch x  6 inch, placed it through the Hasson trocar, half of it covered medial  to the inferior epigastric vessels and half of it lateral to the  inferior epigastric vessels. The defect was well  covered with the mesh. I then secured the mesh to the abdominal wall  using an Covedien universal hernia stapler. Staples were placed through  the Cooper's ligament, one superior medially, one staple on each side of the inferior epigastric  vessel and two staples out laterally. No staples were placed below the  shelving edge of the inguinal ligament. Pneumoperitoneum was reduced  to 8 mmHg. I then brought the peritoneal flap back up to the abdominal  wall and tacked it to the abdominal wall using several staples. There was no  defect in the peritoneum, and the mesh was well covered. I removed the  Hasson trocar and tied down the previously placed pursestring suture.  The closure was viewed laparoscopically. There was no evidence of  fascial defect. There was no air leak at the umbilicus. I did place an additional interrupted suture. There was no  evidence of injury to surrounding structures. Pneumoperitoneum was  released, and the remaining trocars were removed. All skin incisions  were closed with a 4-0 Monocryl in a subcuticular fashion followed by  application of benzoin, steri-strips, and bandages. All needle,  instrument, and sponge counts  were correct x2. There are no immediate complications. The patient  tolerated the procedure well. The patient was extubated and taken to the  recovery room in stable condition.  Leighton Ruff. Redmond Pulling, MD, FACS General, Bariatric, & Minimally Invasive Surgery [facility], [place]

## 2013-04-15 NOTE — Preoperative (Signed)
Beta Blockers   Reason not to administer Beta Blockers:Not Applicable 

## 2013-04-15 NOTE — ED Notes (Signed)
Pt from home, c/o groin pain radiating to lower abd,. Pt states pain in left groin, states he feels a bulge, is tender to touch. Has hx of hernia, states it feels the same

## 2013-04-16 MED ORDER — ZOLPIDEM TARTRATE 5 MG PO TABS
5.0000 mg | ORAL_TABLET | Freq: Every evening | ORAL | Status: DC | PRN
Start: 2013-04-16 — End: 2013-04-16

## 2013-04-16 MED ORDER — OXYCODONE HCL 5 MG PO TABS: 5.0000 mg | ORAL_TABLET | ORAL | Status: DC | PRN

## 2013-04-16 MED ORDER — OXYCODONE-ACETAMINOPHEN 5-325 MG PO TABS: 2.0000 | ORAL_TABLET | ORAL | Status: DC | PRN

## 2013-04-16 MED ORDER — PANTOPRAZOLE SODIUM 40 MG PO TBEC
40.0000 mg | DELAYED_RELEASE_TABLET | Freq: Every day | ORAL | Status: DC
  Administered 2013-04-16: 40 mg via ORAL
  Filled 2013-04-16: qty 1

## 2013-04-16 MED ORDER — TAMSULOSIN HCL 0.4 MG PO CAPS: 0.4000 mg | ORAL_CAPSULE | Freq: Every day | ORAL | Status: DC

## 2013-04-16 MED ORDER — MORPHINE SULFATE 2 MG/ML IJ SOLN: 1.0000 mg | INTRAMUSCULAR | Status: DC | PRN

## 2013-04-16 MED ORDER — TAMSULOSIN HCL 0.4 MG PO CAPS
0.4000 mg | ORAL_CAPSULE | Freq: Every day | ORAL | Status: DC
  Administered 2013-04-16: 0.4 mg via ORAL
  Filled 2013-04-16: qty 1

## 2013-04-16 MED ORDER — IBUPROFEN 600 MG PO TABS: 600.0000 mg | ORAL_TABLET | Freq: Three times a day (TID) | ORAL | Status: AC | PRN

## 2013-04-16 MED ORDER — OXYCODONE-ACETAMINOPHEN 5-325 MG PO TABS
2.0000 | ORAL_TABLET | ORAL | Status: DC | PRN
  Administered 2013-04-16: 2 via ORAL
  Filled 2013-04-16: qty 2

## 2013-04-16 NOTE — Progress Notes (Signed)
Discharge Note. Reviewed discharge instructions with pt and pt's wife at the bedside. Reviewed care of incisional sites, Rx's, home medications, follow up appointment, and activity restrictions. Pt and wife verbalize understanding and report that pt is ready to go home. Pt reports that he feels like his pain is better controled this afternoon. Pt is ready for discharge.

## 2013-04-16 NOTE — Progress Notes (Signed)
UR completed 

## 2013-04-16 NOTE — Discharge Instructions (Signed)
[facility] _______[facility]  UMBILICAL OR INGUINAL HERNIA REPAIR: POST OP INSTRUCTIONS  Always review your discharge instruction sheet given to you by the facility where your surgery was performed. IF YOU HAVE DISABILITY OR FAMILY LEAVE FORMS, YOU MUST BRING THEM TO THE OFFICE FOR PROCESSING.   DO NOT GIVE THEM TO YOUR DOCTOR.  1. A  prescription for pain medication may be given to you upon discharge.  Take your pain medication as prescribed, if needed.  If narcotic pain medicine is not needed, then you may take acetaminophen (Tylenol) or ibuprofen (Advil) as needed. 2. Take your usually prescribed medications unless otherwise directed. 3. If you need a refill on your pain medication, please contact your pharmacy.  They will contact our office to request authorization. Prescriptions will not be filled after 5 pm or on week-ends. 4. You should follow a light diet the first 24 hours after arrival home, such as soup and crackers, etc.  Be sure to include lots of fluids daily.  Resume your normal diet the day after surgery. 5. Most patients will experience some swelling and bruising around the umbilicus or in the groin and scrotum.  Ice packs and reclining will help.  Swelling and bruising can take several days to resolve.  6. It is common to experience some constipation if taking pain medication after surgery.  Increasing fluid intake and taking a stool softener (such as Colace) will usually help or prevent this problem from occurring.  A mild laxative (Milk of Magnesia or Miralax) should be taken according to package directions if there are no bowel movements after 48 hours. 7. Unless discharge instructions indicate otherwise, you may remove your bandages 24-48 hours after surgery, and you may shower at that time.  You may have steri-strips (small skin tapes) in place directly over the incision.  These strips should be left on the skin for 7-10 days.  If your surgeon used skin glue on the  incision, you may shower in 24 hours.  The glue will flake off over the next 2-3 weeks.  Any sutures or staples will be removed at the office during your follow-up visit. 8. ACTIVITIES:  You may resume regular (light) daily activities beginning the next day--such as daily self-care, walking, climbing stairs--gradually increasing activities as tolerated.  You may have sexual intercourse when it is comfortable.  Refrain from any heavy lifting or straining until approved by your doctor. a. You may drive when you are no longer taking prescription pain medication, you can comfortably wear a seatbelt, and you can safely maneuver your car and apply brakes. b. RETURN TO WORK:  __________________________________________________________ 9. You should see your doctor in the office for a follow-up appointment approximately 2-3 weeks after your surgery.  Make sure that you call for this appointment within a day or two after you arrive home to insure a convenient appointment time. 10. OTHER INSTRUCTIONS:  __________________________________________________________________________________________________________________________________________________________________________________________  WHEN TO CALL YOUR DOCTOR: 1. Fever over 101.0 2. Inability to urinate 3. Nausea and/or vomiting 4. Extreme swelling or bruising 5. Continued bleeding from incision. 6. Increased pain, redness, or drainage from the incision  The clinic staff is available to answer your questions during regular business hours.  Please don't hesitate to call and ask to speak to one of the nurses for clinical concerns.  If you have a medical emergency, go to the nearest emergency room or call 911.  A surgeon from [facility] is always on call at the hospital     [address], [address], [place], [place]  [address] ?  [address] [address], [place], [place]   [address] [phone] ? [phone] ? FAX [phone] Web site:  [email or web address]  

## 2013-04-16 NOTE — Progress Notes (Signed)
Voided 200cc after I/O cath for 1300cc @ 0400

## 2013-04-16 NOTE — Progress Notes (Signed)
C/o signif pain. Had to i/o last night. Voiding independently but having some difficulty  Incision c/d/i. Mild shadowing on umbilical site No celluitis TTP throughout lower abd. No RT/guarding  Adjust pain meds flomax  Leighton Ruff. Redmond Pulling, MD, FACS General, Bariatric, & Minimally Invasive Surgery [facility], [place]

## 2013-04-16 NOTE — Progress Notes (Signed)
1 Day Post-Op  Subjective: Pt having pain, requiring IV pain medication.  C/o dysuria.  Has chronic back pain and FMA  Objective: Vital signs in last 24 hours: Temp:  [97 F (36.1 C)-98.4 F (36.9 C)] 97.9 F (36.6 C) (02/18 0941) Pulse Rate:  [56-100] 63 (02/18 0941) Resp:  [9-18] 17 (02/18 0941) BP: (104-144)/(47-111) 119/75 mmHg (02/18 0941) SpO2:  [93 %-100 %] 100 % (02/18 0941) Last BM Date: 04/15/13  Intake/Output from previous day: 02/17 0701 - 02/18 0700 In: 1600 [I.V.:1600] Out: 1800 [Urine:1800] Intake/Output this shift:   PE GI: +bs abdomen is soft, mild ttp at incision sites.  incision is c/d/i  Lab Results:   Recent Labs  04/15/13 1030  WBC 7.0  HGB 17.2*  HCT 48.8  PLT 200   BMET  Recent Labs  04/15/13 1030  NA 139  K 4.4  CL 100  CO2 22  GLUCOSE 140*  BUN 13  CREATININE 0.96  CALCIUM 9.6   PT/INR No results found for this basename: LABPROT, INR,  in the last 72 hours ABG No results found for this basename: PHART, PCO2, PO2, HCO3,  in the last 72 hours  Studies/Results: No results found.  Anti-infectives: Anti-infectives   Start     Dose/Rate Route Frequency Ordered Stop   04/15/13 1215  cefOXitin (MEFOXIN) 2 g in dextrose 5 % 50 mL IVPB  Status:  Discontinued     2 g 100 mL/hr over 30 Minutes Intravenous To Surgery 04/15/13 1207 04/15/13 1530   04/15/13 1200  [MAR Hold]  cefOXitin (MEFOXIN) 2 g in dextrose 5 % 50 mL IVPB     (On MAR Hold since 04/15/13 1210)   2 g 100 mL/hr over 30 Minutes Intravenous On call to O.R. 04/15/13 1121 04/15/13 1252      Assessment/Plan: 1. Incarcerated left inguinal hernia  2. Polycythemia vera  3. Chronic back pain  4. Chronic sinus problems  5. HTN  6. Gout  7. Anxiety  S/P laparoscopic repair of recurrent left incarcerated direct inguinal hernia with mesh (Dr. Redmond Pulling 04/15/13) POD#1 Add flomax for dysuria Change norco to percocet 2 tablets +5mg  of oxycodone PRN Continue toraldol IV Ice  pack Mobilize Add home omeprazole and and Lorrin Mais Will check on him later today, if pain is better discharge home.     LOS: 1 day    Jaileen Janelle ANP-BC 04/16/2013 10:06 AM

## 2013-04-16 NOTE — Discharge Summary (Signed)
Physician Discharge Summary  Patient ID: Victor [name] MRN: [id number] DOB/AGE: 58-13-1957 58 y.o.  Admit date: 04/15/2013 Discharge date: 04/16/2013  Admitting Diagnosis: Incarcerated left inguinal hernia  Discharge Diagnosis Patient Active Problem List   Diagnosis Date Noted  . Incarcerated left inguinal hernia 04/15/2013  . Incarcerated inguinal hernia 04/15/2013  . Acute bronchitis 03/17/2012  . Heartburn 01/10/2011    Consultants none  Imaging: No results found.  Procedures laparoscopic repair of recurrent left incarcerated direct inguinal hernia with mesh (Dr. Redmond Pulling 04/15/13)   Hospital Course:  The patient presented to [facility] with left inguinal pain.  Workup showed incarcerated left inguinal hernia.  Patient was admitted and underwent procedure listed above.  Tolerated procedure well and was transferred to the floor.  Diet was advanced as tolerated.  On POD #1, the patient was voiding well, tolerating diet, ambulating well, pain well controlled, vital signs stable, incisions c/d/i and felt stable for discharge home.  Patient will follow up in our office in 2 weeks and knows to call with questions or concerns.     Medication List         allopurinol 300 MG tablet  Commonly known as:  ZYLOPRIM  Take 300 mg by mouth daily.     ALPRAZolam 1 MG tablet  Commonly known as:  XANAX  Take 1 mg by mouth every 8 (eight) hours as needed for anxiety.     aspirin EC 81 MG tablet  Take 81 mg by mouth daily.     atenolol 100 MG tablet  Commonly known as:  TENORMIN  Take 100 mg by mouth daily.     calcium carbonate 200 MG capsule  Take 250 mg by mouth 2 (two) times daily with a meal. With D 3     Colchicine 0.6 MG Caps  Take 1 tablet by mouth daily.     diazepam 5 MG tablet  Commonly known as:  VALIUM  Take 5 mg by mouth every 8 (eight) hours as needed for anxiety.     fish oil-omega-3 fatty acids 1000 MG capsule  Take 1 g by mouth 2 (two) times daily.     fluticasone 50 MCG/ACT nasal spray  Commonly known as:  FLONASE  Place 1 spray into both nostrils daily as needed. For congestion     gabapentin 100 MG capsule  Commonly known as:  NEURONTIN  Take 200 mg by mouth 3 (three) times daily.     GINSENG COMPLEX PO  Take 1 capsule by mouth daily.     ibuprofen 600 MG tablet  Commonly known as:  ADVIL,MOTRIN  Take 1 tablet (600 mg total) by mouth 3 (three) times daily as needed.     lisinopril 5 MG tablet  Commonly known as:  PRINIVIL,ZESTRIL  Take 5 mg by mouth daily.     multivitamin with minerals tablet  Take 1 tablet by mouth daily.     omeprazole 40 MG capsule  Commonly known as:  PRILOSEC  Take 40 mg by mouth daily.     OVER THE COUNTER MEDICATION  Take 1 each by mouth daily. Over the counter protein supplement     oxyCODONE 5 MG immediate release tablet  Commonly known as:  Oxy IR/ROXICODONE  Take 1 tablet (5 mg total) by mouth every 4 (four) hours as needed for severe pain.     oxyCODONE-acetaminophen 5-325 MG per tablet  Commonly known as:  PERCOCET/ROXICET  Take 2 tablets by mouth every 4 (four) hours as needed for  severe pain.     tamsulosin 0.4 MG Caps capsule  Commonly known as:  FLOMAX  Take 1 capsule (0.4 mg total) by mouth daily after breakfast.     testosterone cypionate 200 MG/ML injection  Commonly known as:  DEPOTESTOTERONE CYPIONATE  Inject 200 mg into the muscle every 21 ( twenty-one) days.     UNABLE TO FIND  Take 1 tablet by mouth 2 (two) times daily. instaflex tablet for muscle pain, daily     venlafaxine XR 150 MG 24 hr capsule  Commonly known as:  EFFEXOR-XR  Take 150 mg by mouth daily.     VITAMIN E COMPLEX PO  Take 1 capsule by mouth daily.     zolpidem 12.5 MG CR tablet  Commonly known as:  AMBIEN CR  Take 12.5 mg by mouth at bedtime as needed for sleep.             Follow-up Information   Follow up with Gayland Curry, MD. Schedule an appointment as soon as possible for a visit  in 3 weeks. (post op check)    Specialty:  General Surgery   Contact information:   [address] [place] [address] [phone]       Signed: Erby Pian, [facility] [phone]  04/16/2013, 4:32 PM

## 2013-04-18 ENCOUNTER — Encounter (HOSPITAL_COMMUNITY): Payer: Self-pay | Admitting: General Surgery

## 2013-04-18 NOTE — Discharge Summary (Signed)
Jerl Munyan M. Keenan Dimitrov, MD, FACS General, Bariatric, & Minimally Invasive Surgery [facility]  

## 2013-04-21 ENCOUNTER — Telehealth (INDEPENDENT_AMBULATORY_CARE_PROVIDER_SITE_OTHER): Payer: Self-pay | Admitting: General Surgery

## 2013-04-21 NOTE — Telephone Encounter (Signed)
LMOM for patient to call back and ask for Sukari Grist for an apt with Dr. Wilson 

## 2013-05-01 ENCOUNTER — Telehealth (INDEPENDENT_AMBULATORY_CARE_PROVIDER_SITE_OTHER): Payer: Self-pay | Admitting: *Deleted

## 2013-05-01 MED ORDER — OXYCODONE HCL 5 MG PO TABS: 5.0000 mg | ORAL_TABLET | ORAL | Status: DC | PRN

## 2013-05-01 NOTE — Telephone Encounter (Signed)
Patient called to report that he is having some tenderness in his lower back that is exacerbating his chronic back pain.  Patient also reporting some discomfort in the groin area with no redness and minimal swelling per patient.  Patient reports that he is having issues with his left testicle drawing up and having to manually pull it down.  I spoke to Dr. Redmond Pulling who stated patient could either be seen in urgent office tomorrow or could wait to see him next Wednesday at his scheduled appt time.  Patient states he feels he is fine to wait but asking for something for pain between now and then.  Explained that I would send a message to Dr. Redmond Pulling to ask him about this request then we will let him know.  Patient states understanding and agreeable with plan at this time.

## 2013-05-01 NOTE — Telephone Encounter (Signed)
Can have pain med per refill protocol. rx will be printed

## 2013-05-01 NOTE — Addendum Note (Signed)
Addended by: Redmond Pulling Artavius Stearns M on: 05/01/2013 04:47 PM   Modules accepted: Orders

## 2013-05-07 ENCOUNTER — Encounter (INDEPENDENT_AMBULATORY_CARE_PROVIDER_SITE_OTHER): Payer: Self-pay | Admitting: General Surgery

## 2013-05-07 ENCOUNTER — Ambulatory Visit (INDEPENDENT_AMBULATORY_CARE_PROVIDER_SITE_OTHER): Payer: 59 | Admitting: General Surgery

## 2013-05-07 VITALS — BP 142/100 | HR 86 | Temp 98.8°F | Resp 12 | Ht 68.0 in | Wt 250.0 lb

## 2013-05-07 DIAGNOSIS — Z09 Encounter for follow-up examination after completed treatment for conditions other than malignant neoplasm: Secondary | ICD-10-CM

## 2013-05-07 MED ORDER — OXYCODONE-ACETAMINOPHEN 5-325 MG PO TABS: 1.0000 | ORAL_TABLET | Freq: Four times a day (QID) | ORAL | Status: DC | PRN

## 2013-05-07 NOTE — Patient Instructions (Signed)
Avoid heavy lifting, pushing, or pulling until pain resolves

## 2013-05-08 NOTE — Progress Notes (Signed)
Subjective:     Patient ID: Victor [name], male   DOB: Sep 28, 1955, 58 y.o.   MRN: [id number]  HPI 58 year old morbidly obese Caucasian male comes in today for his first postoperative appointment after undergoing urgent laparoscopic repair of a left recurrent incarcerated direct inguinal hernia with mesh on 04/15/2013. He had significant preoperative pain in the emergency room. During surgery he only had pre-peritoneal fat and a [place] omentum incarcerated into the direct defect.The amount of discomfort he had after surgery was a fair amount. He states he has ongoing severe pain. He states he has about the same amount of pain that he had preoperatively. He also complains that it feels like his testicle is riding up. He denies any fever, chills, nausea, vomiting, diarrhea or constipation. He denies any pain or difficulty urinating. He reports a normal appetite.  Review of Systems     Objective:   Physical Exam BP 142/100  Pulse 86  Temp(Src) 98.8 F (37.1 C) (Oral)  Resp 12  Ht 5\' 8"  (1.727 m)  Wt 250 lb (113.399 kg)  BMI 38.02 kg/m2 Moves around with difficulty Lungs are clear to auscultation Regular rate and rhythm Obese, soft, nondistended. Well-healed trocar sites. No umbilical incisional hernia. Tender to palpation in left groin region. Both testicles are down. No tenderness on palpation of his epididymis. A fair amount of suprapubic fat. Small palpable large grape-sized hematoma in the left groin where he has some tenderness    Assessment:     Status post repair of left recurrent direct incarcerated inguinal hernia with mesh     Plan:     He does appear to have a small hematoma which is not surprising. However his pain that he is complaining about is quite surprising. This is about the same amount of pain that he had preoperatively. Without any additional signs such as fever, chills by mouth intolerance, difficulty urinating or altered bowel movements or signs of infection I don't  we need to order a CT scan at this point. I would like to give him a few more weeks to see if his pain improves. He does already take a large amount of Neurontin for his fibromyalgia. He takes up to 1800 mg a day. He was given a refill on his oxycodone. Followup in several weeks. If his pain is still persistent then we'll need to get a CT scan. He and his wife were agreeable with the plan  Leighton Ruff. Redmond Pulling, MD, FACS General, Bariatric, & Minimally Invasive Surgery [facility], [place]

## 2013-06-18 ENCOUNTER — Ambulatory Visit (INDEPENDENT_AMBULATORY_CARE_PROVIDER_SITE_OTHER): Payer: 59 | Admitting: General Surgery

## 2013-06-18 ENCOUNTER — Encounter (INDEPENDENT_AMBULATORY_CARE_PROVIDER_SITE_OTHER): Payer: Self-pay | Admitting: General Surgery

## 2013-06-18 VITALS — BP 148/92 | HR 84 | Temp 97.6°F | Resp 16 | Ht 70.0 in | Wt 245.4 lb

## 2013-06-18 DIAGNOSIS — Z09 Encounter for follow-up examination after completed treatment for conditions other than malignant neoplasm: Secondary | ICD-10-CM

## 2013-06-18 NOTE — Progress Notes (Signed)
Subjective:     Patient ID: Victor [name], male   DOB: 07/16/1955, 58 y.o.   MRN: [id number]  HPI 58 year old obese Caucasian male comes in for followup after undergoing laparoscopic repair of recurrent incarcerated left direct inguinal hernia with mesh in February. He was last seen about 4 weeks ago. He states he is doing much better. He denies any sharp stinging or burning sensation in his groin. He states occasionally he does have some delayed. He denies any diarrhea or constipation. He denies any difficulty urinating. He reports a good appetite  Review of Systems     Objective:   Physical Exam BP 148/92  Pulse 84  Temp(Src) 97.6 F (36.4 C)  Resp 16  Ht 5\' 10"  (1.778 m)  Wt 245 lb 6.4 oz (111.313 kg)  BMI 35.21 kg/m2 Alert, nad Walks with cane abd - obese, soft, nt, nd. Well healed trocar sites. No hernia GU- testicle small (unchanged), but down; L groin hematoma resolved. Mild TTP. No evid of hernia.     Assessment:     Status post repair of incarcerated recurrent left direct inguinal hernia with mesh laparoscopically     Plan:     Overall he is finally doing better. I explained that the dull ache should improve with time. There is no sign of hernia recurrence. He is released of all extremities. Followup as needed  Leighton Ruff. Redmond Pulling, MD, FACS General, Bariatric, & Minimally Invasive Surgery [facility], [place]

## 2014-02-11 NOTE — H&P (Signed)
TOTAL KNEE ADMISSION H&P  Patient is being admitted for right total knee arthroplasty.  Subjective:  Chief Complaint:      Right knee OA / pain.  HPI: JOVAHN BREIT, 58 y.o. male, has a history of pain and functional disability in the right knee due to arthritis and has failed non-surgical conservative treatments for greater than 12 weeks to include  NSAID's and/or analgesics, corticosteriod injections, viscosupplementation injections and activity modification.  Onset of symptoms was gradual, starting years ago with gradually worsening course since that time. The patient noted prior procedures on the knee to include  arthroscopy and menisectomy on the right knee(s).  Patient currently rates pain in the right knee(s) at 7 out of 10 with activity. Patient has night pain, worsening of pain with activity and weight bearing, pain that interferes with activities of daily living and pain with passive range of motion.  Patient has evidence of periarticular osteophytes and joint space narrowing by imaging studies.  There is no active infection.  Risks, benefits and expectations were discussed with the patient.  Risks including but not limited to the risk of anesthesia, blood clots, nerve damage, blood vessel damage, failure of the prosthesis, infection and up to and including death.  Patient understand the risks, benefits and expectations and wishes to proceed with surgery.   PCP: Gara Kroner, MD  D/C Plans:      Home with HHPT  Post-op Meds:       No Rx given  Tranexamic Acid:      To be given - IV   Decadron:      Is to be given  FYI:     ASA post-op  Oxycodone post-op    Patient Active Problem List   Diagnosis Date Noted  . Heartburn 01/10/2011   Past Medical History  Diagnosis Date  . Anxiety   . Hypertension   . GERD (gastroesophageal reflux disease)   . Fibromyalgia   . Chronic low back pain   . Dizziness - light-headed   . Balance problem   . Fall   . Acute URI in September  with cough  . High cholesterol   . Exertional shortness of breath   . Sleep apnea     "don't use CPAP" (04/15/2013)  . Pneumonia     "think I did; close to pneumonia if not"   . Diabetes mellitus     "diet controlled" (04/15/2013)  . Arthritis     "all over" (04/15/2013)  . Gout     Past Surgical History  Procedure Laterality Date  . Posterior lumbar fusion  11/1995    "ray cages" (04/15/2013)  . Tonsillectomy  1960's  . Appendectomy  ~ 2010  . Shoulder arthroscopy Bilateral     "twice each"  . Knee arthroscopy Right 1990's  . Carpal tunnel release Left 1990's  . Hand tendon surgery Left ~ 1978 X2-1980's X3    "had 5 ORs after laceration"  . Polypectomy  01/10/2011    Procedure: POLYPECTOMY;  Surgeon: Lear Ng, MD;  Location: [facility];  Service: Endoscopy;  Laterality: N/A;  . Flexible sigmoidoscopy  12/21/2011    Procedure: FLEXIBLE SIGMOIDOSCOPY;  Surgeon: Lear Ng, MD;  Location: [facility];  Service: Endoscopy;  Laterality: N/A;  . Hot hemostasis  12/21/2011    Procedure: HOT HEMOSTASIS (ARGON PLASMA COAGULATION/BICAP);  Surgeon: Lear Ng, MD;  Location: [facility];  Service: Endoscopy;  Laterality: N/A;  . Vasectomy  1988  . Inguinal hernia  repair Bilateral 1970's - 1984  . Inguinal hernia repair Left 04/15/2013  . Wisdom tooth extraction  2000's  . Anterior cervical decomp/discectomy fusion  2000's X3    "had 3 surgeries on my front neck" (04/15/2013)  . Nasal septum surgery Bilateral 2014    "removed polyps & infection"  . Laparoscopy N/A 04/15/2013    Procedure: LAPAROSCOPY DIAGNOSTIC;  Surgeon: Gayland Curry, MD;  Location: [facility];  Service: General;  Laterality: N/A;  . Inguinal hernia repair Left 04/15/2013    Procedure: LAPAROSCOPIC REPAIR OF INCARCERATED LEFT INGUINAL HERNIA;  Surgeon: Gayland Curry, MD;  Location: [facility];  Service: General;  Laterality: Left;  . Insertion of mesh Left 04/15/2013    Procedure: INSERTION OF MESH;   Surgeon: Gayland Curry, MD;  Location: [facility];  Service: General;  Laterality: Left;    No prescriptions prior to admission   Allergies  Allergen Reactions  . Flexeril [Cyclobenzaprine] Other (See Comments)    Makes patient irritable  . Tetracyclines & Related Other (See Comments)    " makes me angry"    History  Substance Use Topics  . Smoking status: Former Smoker -- 0.10 packs/day for 15 years    Types: Cigarettes, Cigars    Quit date: 01/08/2010  . Smokeless tobacco: Never Used     Comment: 04/15/2013 "smoked ~ 1 pack/month"  . Alcohol Use: 3.0 oz/week    4 Cans of beer, 1 Glasses of wine per week     Comment: 04/15/2013 "glass of wine w/dinner 2-3 times/month;  plus 3-4 beers/wk"    Family History  Problem Relation Age of Onset  . Hypertension Father      Review of Systems  Constitutional: Negative.   HENT: Negative.   Eyes: Negative.   Respiratory: Negative.   Cardiovascular: Negative.   Gastrointestinal: Positive for heartburn.  Genitourinary: Negative.   Musculoskeletal: Positive for back pain and joint pain.  Skin: Negative.   Psychiatric/Behavioral: The patient is nervous/anxious.     Objective:  Physical Exam  Constitutional: He is oriented to person, place, and time. He appears well-developed and well-nourished.  HENT:  Head: Normocephalic and atraumatic.  Eyes: Pupils are equal, round, and reactive to light.  Neck: Neck supple. No JVD present. No tracheal deviation present. No thyromegaly present.  Cardiovascular: Normal rate, regular rhythm, normal heart sounds and intact distal pulses.   Respiratory: Effort normal and breath sounds normal. No stridor. No respiratory distress. He has no wheezes.  GI: Soft. There is no tenderness. There is no guarding.  Musculoskeletal:       Right knee: He exhibits decreased range of motion, swelling and bony tenderness. He exhibits no ecchymosis, no deformity, no laceration and no erythema. Tenderness found.   Lymphadenopathy:    He has no cervical adenopathy.  Neurological: He is alert and oriented to person, place, and time. A sensory deficit (neuropathy bilateral feet) is present.  Skin: Skin is warm and dry.  Psychiatric: He has a normal mood and affect.     Labs:  Estimated body mass index is 35.21 kg/(m^2) as calculated from the following:   Height as of 06/18/13: 5\' 10"  (1.778 m).   Weight as of 06/18/13: 111.313 kg (245 lb 6.4 oz).   Imaging Review Plain radiographs demonstrate severe degenerative joint disease of the right knee(s). The overall alignment is neutral. The bone quality appears to be good for age and reported activity level.  Assessment/Plan:  End stage arthritis, right knee  The patient history, physical examination, clinical judgment of the provider and imaging studies are consistent with end stage degenerative joint disease of the right knee(s) and total knee arthroplasty is deemed medically necessary. The treatment options including medical management, injection therapy arthroscopy and arthroplasty were discussed at length. The risks and benefits of total knee arthroplasty were presented and reviewed. The risks due to aseptic loosening, infection, stiffness, patella tracking problems, thromboembolic complications and other imponderables were discussed. The patient acknowledged the explanation, agreed to proceed with the plan and consent was signed. Patient is being admitted for inpatient treatment for surgery, pain control, PT, OT, prophylactic antibiotics, VTE prophylaxis, progressive ambulation and ADL's and discharge planning. The patient is planning to be discharged home with home health services.     West Pugh Trine Fread   PA-C  02/11/2014, 11:04 AM

## 2014-02-16 NOTE — Patient Instructions (Addendum)
Victor [name]  02/16/2014   Your procedure is scheduled on: 02/24/14  Report to [facility]  Entrance and follow signs to               [place] at 10:00 AM.   Call this number if you have problems the morning of surgery [phone]   Remember:  Do not eat food or drink liquids :After Midnight.          MAY HAVE CLEAR LIQUIDS UNTIL 7:00 AM     Take these medicines the morning of surgery with A SIP OF WATER: ALLOPURINOL / ATENOLOL / COLCRYS / GABAPENTIN / NEXIUM / VENLAFAXIONE / NASAL SPRAY / MAY TAKE HYDROCODONE IF NEEDED FOR PAIN / MAU TAKE XANAX OR VALIUM                                You may not have any metal on your body including hair pins and              piercings  Do not wear jewelry, make-up, lotions, powders or perfumes.             Do not wear nail polish.  Do not shave  48 hours prior to surgery.              Men may shave face and neck.   Do not bring valuables to the hospital. [place].  Contacts, dentures or bridgework may not be worn into surgery.  Leave suitcase in the car. After surgery it may be brought to your room.     Patients discharged the day of surgery will not be allowed to drive home.  Name and phone number of your driver:  Special Instructions: N/A              Please read over the following fact sheets you were given: _____________________________________________________________________                                                     [place]  Before surgery, you can play an important role.  Because skin is not sterile, your skin needs to be as free of germs as possible.  You can reduce the number of germs on your skin by washing with CHG (chlorahexidine gluconate) soap before surgery.  CHG is an antiseptic cleaner which kills germs and bonds with the skin to continue killing germs even after washing. Please DO NOT use if you  have an allergy to CHG or antibacterial soaps.  If your skin becomes reddened/irritated stop using the CHG and inform your nurse when you arrive at Short Stay. Do not shave (including legs and underarms) for at least 48 hours prior to the first CHG shower.  You may shave your face. Please follow these instructions carefully:   1.  Shower with CHG Soap the night before surgery and the  morning of Surgery.   2.  If you choose to wash your hair, wash your hair first as usual with your  normal  Shampoo.   3.  After you shampoo, rinse your hair and body thoroughly to remove the  shampoo.                                         4.  Use CHG as you would any other liquid soap.  You can apply chg directly  to the skin and wash . Gently wash with scrungie or clean wascloth    5.  Apply the CHG Soap to your body ONLY FROM THE NECK DOWN.   Do not use on open                           Wound or open sores. Avoid contact with eyes, ears mouth and genitals (private parts).                        Genitals (private parts) with your normal soap.              6.  Wash thoroughly, paying special attention to the area where your surgery  will be performed.   7.  Thoroughly rinse your body with warm water from the neck down.   8.  DO NOT shower/wash with your normal soap after using and rinsing off  the CHG Soap .                9.  Pat yourself dry with a clean towel.             10.  Wear clean pajamas.             11.  Place clean sheets on your bed the night of your first shower and do not  sleep with pets.  Day of Surgery : Do not apply any lotions/deodorants the morning of surgery.  Please wear clean clothes to the hospital/surgery center.  FAILURE TO FOLLOW THESE INSTRUCTIONS MAY RESULT IN THE CANCELLATION OF YOUR SURGERY    PATIENT SIGNATURE_________________________________  ______________________________________________________________________     Victor [name]  An incentive  spirometer is a tool that can help keep your lungs clear and active. This tool measures how well you are filling your lungs with each breath. Taking long deep breaths may help reverse or decrease the chance of developing breathing (pulmonary) problems (especially infection) following:  A long period of time when you are unable to move or be active. BEFORE THE PROCEDURE   If the spirometer includes an indicator to show your best effort, your nurse or respiratory therapist will set it to a desired goal.  If possible, sit up straight or lean slightly forward. Try not to slouch.  Hold the incentive spirometer in an upright position. INSTRUCTIONS FOR USE   Sit on the edge of your bed if possible, or sit up as far as you can in bed or on a chair.  Hold the incentive spirometer in an upright position.  Breathe out normally.  Place the mouthpiece in your mouth and seal your lips tightly around it.  Breathe in slowly and as deeply as possible, raising the piston or the ball toward the top of the column.  Hold your breath for 3-5 seconds or for as long as possible. Allow the piston or ball to fall to the bottom of the column.  Remove the mouthpiece from your mouth and breathe out normally.  Rest for  a few seconds and repeat Steps 1 through 7 at least 10 times every 1-2 hours when you are awake. Take your time and take a few normal breaths between deep breaths.  The spirometer may include an indicator to show your best effort. Use the indicator as a goal to work toward during each repetition.  After each set of 10 deep breaths, practice coughing to be sure your lungs are clear. If you have an incision (the cut made at the time of surgery), support your incision when coughing by placing a pillow or rolled up towels firmly against it. Once you are able to get out of bed, walk around indoors and cough well. You may stop using the incentive spirometer when instructed by your caregiver.  RISKS AND  COMPLICATIONS  Take your time so you do not get dizzy or light-headed.  If you are in pain, you may need to take or ask for pain medication before doing incentive spirometry. It is harder to take a deep breath if you are having pain. AFTER USE  Rest and breathe slowly and easily.  It can be helpful to keep track of a log of your progress. Your caregiver can provide you with a simple table to help with this. If you are using the spirometer at home, follow these instructions: [place] IF:   You are having difficultly using the spirometer.  You have trouble using the spirometer as often as instructed.  Your pain medication is not giving enough relief while using the spirometer.  You develop fever of 100.5 F (38.1 C) or higher. SEEK IMMEDIATE MEDICAL CARE IF:   You cough up bloody sputum that had not been present before.  You develop fever of 102 F (38.9 C) or greater.  You develop worsening pain at or near the incision site. MAKE SURE YOU:   Understand these instructions.  Will watch your condition.  Will get help right away if you are not doing well or get worse. Document Released: 06/26/2006 Document Revised: 05/08/2011 Document Reviewed: 08/27/2006 Exit[employer] Patient Information 2014 [employer], [place].   ________________________________________________________________________  WHAT IS A BLOOD TRANSFUSION? Blood Transfusion Information  A transfusion is the replacement of blood or some of its parts. Blood is made up of multiple cells which provide different functions.  Red blood cells carry oxygen and are used for blood loss replacement.  White blood cells fight against infection.  Platelets control bleeding.  Plasma helps clot blood.  Other blood products are available for specialized needs, such as hemophilia or other clotting disorders. BEFORE THE TRANSFUSION  Who gives blood for transfusions?   Healthy volunteers who are fully evaluated to make sure  their blood is safe. This is blood bank blood. Transfusion therapy is the safest it has ever been in the practice of medicine. Before blood is taken from a donor, a complete history is taken to make sure that person has no history of diseases nor engages in risky social behavior (examples are intravenous drug use or sexual activity with multiple partners). The donor's travel history is screened to minimize risk of transmitting infections, such as malaria. The donated blood is tested for signs of infectious diseases, such as HIV and hepatitis. The blood is then tested to be sure it is compatible with you in order to minimize the chance of a transfusion reaction. If you or a relative donates blood, this is often done in anticipation of surgery and is not appropriate for emergency situations. It takes many days  to process the donated blood. RISKS AND COMPLICATIONS Although transfusion therapy is very safe and saves many lives, the main dangers of transfusion include:   Getting an infectious disease.  Developing a transfusion reaction. This is an allergic reaction to something in the blood you were given. Every precaution is taken to prevent this. The decision to have a blood transfusion has been considered carefully by your caregiver before blood is given. Blood is not given unless the benefits outweigh the risks. AFTER THE TRANSFUSION  Right after receiving a blood transfusion, you will usually feel much better and more energetic. This is especially true if your red blood cells have gotten low (anemic). The transfusion raises the level of the red blood cells which carry oxygen, and this usually causes an energy increase.  The nurse administering the transfusion will monitor you carefully for complications. HOME CARE INSTRUCTIONS  No special instructions are needed after a transfusion. You may find your energy is better. Speak with your caregiver about any limitations on activity for underlying diseases  you may have. SEEK MEDICAL CARE IF:   Your condition is not improving after your transfusion.  You develop redness or irritation at the intravenous (IV) site. SEEK IMMEDIATE MEDICAL CARE IF:  Any of the following symptoms occur over the next 12 hours:  Shaking chills.  You have a temperature by mouth above 102 F (38.9 C), not controlled by medicine.  Chest, back, or muscle pain.  People around you feel you are not acting correctly or are confused.  Shortness of breath or difficulty breathing.  Dizziness and fainting.  You get a rash or develop hives.  You have a decrease in urine output.  Your urine turns a dark color or changes to pink, red, or brown. Any of the following symptoms occur over the next 10 days:  You have a temperature by mouth above 102 F (38.9 C), not controlled by medicine.  Shortness of breath.  Weakness after normal activity.  The white part of the eye turns yellow (jaundice).  You have a decrease in the amount of urine or are urinating less often.  Your urine turns a dark color or changes to pink, red, or brown. Document Released: 02/11/2000 Document Revised: 05/08/2011 Document Reviewed: 09/30/2007 [facility] Patient Information 2014 [place], [place].  _______________________________________________________________________

## 2014-02-17 ENCOUNTER — Encounter (HOSPITAL_COMMUNITY)
Admission: RE | Admit: 2014-02-17 | Discharge: 2014-02-17 | Disposition: A | Discharge status: Home / Self Care | Payer: 59 | Source: Ambulatory Visit | Attending: Orthopedic Surgery | Admitting: Orthopedic Surgery

## 2014-02-17 ENCOUNTER — Ambulatory Visit (HOSPITAL_COMMUNITY)
Admission: RE | Admit: 2014-02-17 | Discharge: 2014-02-17 | Disposition: A | Discharge status: Home / Self Care | Payer: 59 | Source: Ambulatory Visit | Attending: Anesthesiology | Admitting: Anesthesiology

## 2014-02-17 ENCOUNTER — Encounter (HOSPITAL_COMMUNITY): Payer: Self-pay

## 2014-02-17 DIAGNOSIS — Z01818 Encounter for other preprocedural examination: Secondary | ICD-10-CM | POA: Insufficient documentation

## 2014-02-17 DIAGNOSIS — I10 Essential (primary) hypertension: Secondary | ICD-10-CM | POA: Diagnosis not present

## 2014-02-17 HISTORY — DX: Other cervical disc degeneration, unspecified cervical region: M50.30

## 2014-02-17 HISTORY — DX: Anesthesia of skin: R20.0

## 2014-02-17 HISTORY — DX: Other chronic pain: G89.29

## 2014-02-17 HISTORY — DX: Other intervertebral disc degeneration, lumbar region without mention of lumbar back pain or lower extremity pain: M51.369

## 2014-02-17 HISTORY — DX: Other intervertebral disc degeneration, lumbar region: M51.36

## 2014-02-17 HISTORY — DX: Dorsalgia, unspecified: M54.9

## 2014-02-17 LAB — BASIC METABOLIC PANEL
Anion gap: 7 (ref 5–15)
BUN: 12 mg/dL (ref 6–23)
CHLORIDE: 99 meq/L (ref 96–112)
CO2: 29 mmol/L (ref 19–32)
Calcium: 9.7 mg/dL (ref 8.4–10.5)
Creatinine, Ser: 0.93 mg/dL (ref 0.50–1.35)
GFR calc non Af Amer: >90 mL/min (ref >90)
GLUCOSE: 118 mg/dL — AB (ref 70–99)
POTASSIUM: 4.7 mmol/L (ref 3.5–5.1)
Sodium: 135 mmol/L (ref 135–145)

## 2014-02-17 LAB — ABO/RH: ABO/RH(D): A POS

## 2014-02-17 LAB — PROTIME-INR
INR: 1.07 (ref 0.00–1.49)
PROTHROMBIN TIME: 14 s (ref 11.6–15.2)

## 2014-02-17 LAB — URINALYSIS, ROUTINE W REFLEX MICROSCOPIC
Bilirubin Urine: NEGATIVE
Glucose, UA: NEGATIVE mg/dL
HGB URINE DIPSTICK: NEGATIVE
Ketones, ur: NEGATIVE mg/dL
Leukocytes, UA: NEGATIVE
Nitrite: NEGATIVE
Protein, ur: 30 mg/dL — AB
Specific Gravity, Urine: 1.011 (ref 1.005–1.030)
Urobilinogen, UA: 0.2 mg/dL (ref 0.0–1.0)
pH: 6.5 (ref 5.0–8.0)

## 2014-02-17 LAB — CBC
HCT: 51.1 % (ref 39.0–52.0)
HEMOGLOBIN: 17.3 g/dL — AB (ref 13.0–17.0)
MCH: 31.3 pg (ref 26.0–34.0)
MCHC: 33.9 g/dL (ref 30.0–36.0)
MCV: 92.4 fL (ref 78.0–100.0)
Platelets: 199 10*3/uL (ref 150–400)
RBC: 5.53 MIL/uL (ref 4.22–5.81)
RDW: 14.8 % (ref 11.5–15.5)
WBC: 6.9 10*3/uL (ref 4.0–10.5)

## 2014-02-17 LAB — SURGICAL PCR SCREEN
MRSA, PCR: NEGATIVE
Staphylococcus aureus: NEGATIVE

## 2014-02-17 LAB — APTT: aPTT: 30 seconds (ref 24–37)

## 2014-02-17 LAB — URINE MICROSCOPIC-ADD ON

## 2014-02-24 ENCOUNTER — Inpatient Hospital Stay (HOSPITAL_COMMUNITY): Payer: 59 | Admitting: Anesthesiology

## 2014-02-24 ENCOUNTER — Encounter (HOSPITAL_COMMUNITY): Payer: Self-pay | Admitting: *Deleted

## 2014-02-24 ENCOUNTER — Encounter (HOSPITAL_COMMUNITY)
Admission: RE | Disposition: A | Discharge status: Home Health | Payer: Self-pay | Source: Ambulatory Visit | Attending: Orthopedic Surgery

## 2014-02-24 ENCOUNTER — Inpatient Hospital Stay (HOSPITAL_COMMUNITY)
Admission: RE | Admit: 2014-02-24 | Discharge: 2014-02-26 | DRG: 470 | Disposition: A | Discharge status: Home Health | Payer: 59 | Source: Ambulatory Visit | Attending: Orthopedic Surgery | Admitting: Orthopedic Surgery

## 2014-02-24 DIAGNOSIS — M659 Synovitis and tenosynovitis, unspecified: Secondary | ICD-10-CM | POA: Diagnosis present

## 2014-02-24 DIAGNOSIS — Z87891 Personal history of nicotine dependence: Secondary | ICD-10-CM | POA: Diagnosis not present

## 2014-02-24 DIAGNOSIS — G4730 Sleep apnea, unspecified: Secondary | ICD-10-CM | POA: Diagnosis present

## 2014-02-24 DIAGNOSIS — Z96651 Presence of right artificial knee joint: Secondary | ICD-10-CM

## 2014-02-24 DIAGNOSIS — E119 Type 2 diabetes mellitus without complications: Secondary | ICD-10-CM | POA: Diagnosis present

## 2014-02-24 DIAGNOSIS — M797 Fibromyalgia: Secondary | ICD-10-CM | POA: Diagnosis present

## 2014-02-24 DIAGNOSIS — I10 Essential (primary) hypertension: Secondary | ICD-10-CM | POA: Diagnosis present

## 2014-02-24 DIAGNOSIS — Z96659 Presence of unspecified artificial knee joint: Secondary | ICD-10-CM

## 2014-02-24 DIAGNOSIS — Z6841 Body Mass Index (BMI) 40.0 and over, adult: Secondary | ICD-10-CM

## 2014-02-24 DIAGNOSIS — K219 Gastro-esophageal reflux disease without esophagitis: Secondary | ICD-10-CM | POA: Diagnosis present

## 2014-02-24 DIAGNOSIS — M179 Osteoarthritis of knee, unspecified: Secondary | ICD-10-CM | POA: Diagnosis present

## 2014-02-24 HISTORY — PX: TOTAL KNEE ARTHROPLASTY: SHX125

## 2014-02-24 LAB — TYPE AND SCREEN
ABO/RH(D): A POS
Antibody Screen: NEGATIVE

## 2014-02-24 LAB — GLUCOSE, CAPILLARY
Glucose-Capillary: 124 mg/dL — ABNORMAL HIGH (ref 70–99)
Glucose-Capillary: 121 mg/dL — ABNORMAL HIGH (ref 70–99)

## 2014-02-24 SURGERY — ARTHROPLASTY, KNEE, TOTAL
Anesthesia: Spinal | Site: Knee | Laterality: Right

## 2014-02-24 MED ORDER — DIAZEPAM 5 MG PO TABS: 5.0000 mg | ORAL_TABLET | Freq: Three times a day (TID) | ORAL | Status: DC | PRN

## 2014-02-24 MED ORDER — CEFAZOLIN SODIUM-DEXTROSE 2-3 GM-% IV SOLR
INTRAVENOUS | Status: AC
Start: 2014-02-24 — End: 2014-02-24
  Filled 2014-02-24: qty 50

## 2014-02-24 MED ORDER — KETOROLAC TROMETHAMINE 30 MG/ML IJ SOLN
INTRAMUSCULAR | Status: DC | PRN
  Administered 2014-02-24: 30 mg via INTRAVENOUS

## 2014-02-24 MED ORDER — VENLAFAXINE HCL ER 150 MG PO CP24
150.0000 mg | ORAL_CAPSULE | Freq: Every morning | ORAL | Status: DC
  Administered 2014-02-25 – 2014-02-26 (×2): 150 mg via ORAL
  Filled 2014-02-24 (×2): qty 1

## 2014-02-24 MED ORDER — PROPOFOL 10 MG/ML IV BOLUS
INTRAVENOUS | Status: AC
  Filled 2014-02-24: qty 20

## 2014-02-24 MED ORDER — FERROUS SULFATE 325 (65 FE) MG PO TABS
325.0000 mg | ORAL_TABLET | Freq: Three times a day (TID) | ORAL | Status: DC
  Administered 2014-02-24 – 2014-02-25 (×4): 325 mg via ORAL
  Filled 2014-02-24 (×8): qty 1

## 2014-02-24 MED ORDER — SODIUM CHLORIDE 0.9 % IJ SOLN
INTRAMUSCULAR | Status: DC | PRN
  Administered 2014-02-24: 30 mL via INTRAVENOUS

## 2014-02-24 MED ORDER — ONDANSETRON HCL 4 MG PO TABS: 4.0000 mg | ORAL_TABLET | Freq: Four times a day (QID) | ORAL | Status: DC | PRN

## 2014-02-24 MED ORDER — TRANEXAMIC ACID 100 MG/ML IV SOLN
1000.0000 mg | Freq: Once | INTRAVENOUS | Status: AC
  Administered 2014-02-24: 1000 mg via INTRAVENOUS
  Filled 2014-02-24: qty 10

## 2014-02-24 MED ORDER — DEXAMETHASONE SODIUM PHOSPHATE 10 MG/ML IJ SOLN
10.0000 mg | Freq: Once | INTRAMUSCULAR | Status: AC
  Administered 2014-02-25: 10 mg via INTRAVENOUS
  Filled 2014-02-24: qty 1

## 2014-02-24 MED ORDER — ONDANSETRON HCL 4 MG/2ML IJ SOLN: 4.0000 mg | Freq: Four times a day (QID) | INTRAMUSCULAR | Status: DC | PRN

## 2014-02-24 MED ORDER — BUPIVACAINE-EPINEPHRINE (PF) 0.25% -1:200000 IJ SOLN
INTRAMUSCULAR | Status: AC
  Filled 2014-02-24: qty 30

## 2014-02-24 MED ORDER — SODIUM CHLORIDE 0.9 % IJ SOLN
INTRAMUSCULAR | Status: AC
  Filled 2014-02-24: qty 50

## 2014-02-24 MED ORDER — MIDAZOLAM HCL 5 MG/5ML IJ SOLN
INTRAMUSCULAR | Status: DC | PRN
  Administered 2014-02-24: 2 mg via INTRAVENOUS

## 2014-02-24 MED ORDER — HYDROMORPHONE HCL 1 MG/ML IJ SOLN
0.2500 mg | INTRAMUSCULAR | Status: DC | PRN
  Administered 2014-02-24 (×3): 0.5 mg via INTRAVENOUS

## 2014-02-24 MED ORDER — METOCLOPRAMIDE HCL 10 MG PO TABS: 5.0000 mg | ORAL_TABLET | Freq: Three times a day (TID) | ORAL | Status: DC | PRN

## 2014-02-24 MED ORDER — KETOROLAC TROMETHAMINE 30 MG/ML IJ SOLN
INTRAMUSCULAR | Status: AC
  Filled 2014-02-24: qty 1

## 2014-02-24 MED ORDER — CEFAZOLIN SODIUM-DEXTROSE 2-3 GM-% IV SOLR
2.0000 g | Freq: Four times a day (QID) | INTRAVENOUS | Status: AC
  Administered 2014-02-24 – 2014-02-25 (×2): 2 g via INTRAVENOUS
  Filled 2014-02-24 (×3): qty 50

## 2014-02-24 MED ORDER — SALINE SPRAY 0.65 % NA SOLN
1.0000 | NASAL | Status: DC | PRN
  Filled 2014-02-24: qty 44

## 2014-02-24 MED ORDER — 0.9 % SODIUM CHLORIDE (POUR BTL) OPTIME
TOPICAL | Status: DC | PRN
  Administered 2014-02-24: 1000 mL

## 2014-02-24 MED ORDER — LACTATED RINGERS IV SOLN: INTRAVENOUS | Status: DC

## 2014-02-24 MED ORDER — METHOCARBAMOL 500 MG PO TABS
500.0000 mg | ORAL_TABLET | Freq: Four times a day (QID) | ORAL | Status: DC | PRN
  Administered 2014-02-24: 500 mg via ORAL
  Filled 2014-02-24 (×3): qty 1

## 2014-02-24 MED ORDER — HYDROMORPHONE HCL 1 MG/ML IJ SOLN
INTRAMUSCULAR | Status: AC
  Filled 2014-02-24: qty 1

## 2014-02-24 MED ORDER — PHENOL 1.4 % MT LIQD: 1.0000 | OROMUCOSAL | Status: DC | PRN

## 2014-02-24 MED ORDER — MENTHOL 3 MG MT LOZG: 1.0000 | LOZENGE | OROMUCOSAL | Status: DC | PRN

## 2014-02-24 MED ORDER — COLCHICINE 0.6 MG PO TABS
0.6000 mg | ORAL_TABLET | Freq: Every day | ORAL | Status: DC
  Administered 2014-02-24 – 2014-02-26 (×3): 0.6 mg via ORAL
  Filled 2014-02-24 (×3): qty 1

## 2014-02-24 MED ORDER — PHENYLEPHRINE 40 MCG/ML (10ML) SYRINGE FOR IV PUSH (FOR BLOOD PRESSURE SUPPORT)
PREFILLED_SYRINGE | INTRAVENOUS | Status: AC
  Filled 2014-02-24: qty 10

## 2014-02-24 MED ORDER — SODIUM CHLORIDE 0.9 % IR SOLN
Status: DC | PRN
  Administered 2014-02-24: 1000 mL

## 2014-02-24 MED ORDER — PROPOFOL INFUSION 10 MG/ML OPTIME
INTRAVENOUS | Status: DC | PRN
  Administered 2014-02-24: 160 ug/kg/min via INTRAVENOUS

## 2014-02-24 MED ORDER — OXYCODONE HCL 5 MG PO TABS
5.0000 mg | ORAL_TABLET | ORAL | Status: DC
  Administered 2014-02-24: 15 mg via ORAL
  Administered 2014-02-24: 10 mg via ORAL
  Administered 2014-02-25 – 2014-02-26 (×9): 15 mg via ORAL
  Filled 2014-02-24 (×6): qty 3
  Filled 2014-02-24: qty 2
  Filled 2014-02-24 (×4): qty 3

## 2014-02-24 MED ORDER — LACTATED RINGERS IV SOLN
INTRAVENOUS | Status: DC
  Administered 2014-02-24: 1000 mL via INTRAVENOUS
  Administered 2014-02-24: 12:00:00 via INTRAVENOUS

## 2014-02-24 MED ORDER — ATENOLOL 100 MG PO TABS
100.0000 mg | ORAL_TABLET | Freq: Every morning | ORAL | Status: DC
  Administered 2014-02-25 – 2014-02-26 (×2): 100 mg via ORAL
  Filled 2014-02-24 (×2): qty 1

## 2014-02-24 MED ORDER — MEPERIDINE HCL 50 MG/ML IJ SOLN: 6.2500 mg | INTRAMUSCULAR | Status: DC | PRN

## 2014-02-24 MED ORDER — ZOLPIDEM TARTRATE 5 MG PO TABS
5.0000 mg | ORAL_TABLET | Freq: Every evening | ORAL | Status: DC | PRN
Start: 2014-02-24 — End: 2014-02-26

## 2014-02-24 MED ORDER — CELECOXIB 200 MG PO CAPS
200.0000 mg | ORAL_CAPSULE | Freq: Two times a day (BID) | ORAL | Status: DC
  Administered 2014-02-24 – 2014-02-26 (×4): 200 mg via ORAL
  Filled 2014-02-24 (×5): qty 1

## 2014-02-24 MED ORDER — BISACODYL 10 MG RE SUPP: 10.0000 mg | Freq: Every day | RECTAL | Status: DC | PRN

## 2014-02-24 MED ORDER — ASPIRIN EC 325 MG PO TBEC
325.0000 mg | DELAYED_RELEASE_TABLET | Freq: Two times a day (BID) | ORAL | Status: DC
  Administered 2014-02-25 – 2014-02-26 (×3): 325 mg via ORAL
  Filled 2014-02-24 (×5): qty 1

## 2014-02-24 MED ORDER — FENTANYL CITRATE 0.05 MG/ML IJ SOLN
INTRAMUSCULAR | Status: DC | PRN
  Administered 2014-02-24 (×2): 100 ug via INTRAVENOUS

## 2014-02-24 MED ORDER — CEFAZOLIN SODIUM-DEXTROSE 2-3 GM-% IV SOLR
2.0000 g | INTRAVENOUS | Status: AC
  Administered 2014-02-24: 2 g via INTRAVENOUS

## 2014-02-24 MED ORDER — DIPHENHYDRAMINE HCL 25 MG PO CAPS: 25.0000 mg | ORAL_CAPSULE | Freq: Four times a day (QID) | ORAL | Status: DC | PRN

## 2014-02-24 MED ORDER — GABAPENTIN 100 MG PO CAPS
200.0000 mg | ORAL_CAPSULE | Freq: Three times a day (TID) | ORAL | Status: DC
  Administered 2014-02-24 – 2014-02-26 (×6): 200 mg via ORAL
  Filled 2014-02-24 (×8): qty 2

## 2014-02-24 MED ORDER — PANTOPRAZOLE SODIUM 40 MG PO TBEC
80.0000 mg | DELAYED_RELEASE_TABLET | Freq: Every day | ORAL | Status: DC
  Administered 2014-02-24 – 2014-02-26 (×3): 80 mg via ORAL
  Filled 2014-02-24 (×3): qty 2

## 2014-02-24 MED ORDER — MAGNESIUM CITRATE PO SOLN: 1.0000 | Freq: Once | ORAL | Status: AC | PRN

## 2014-02-24 MED ORDER — ALLOPURINOL 300 MG PO TABS
300.0000 mg | ORAL_TABLET | Freq: Every day | ORAL | Status: DC
  Administered 2014-02-25 – 2014-02-26 (×2): 300 mg via ORAL
  Filled 2014-02-24 (×2): qty 1

## 2014-02-24 MED ORDER — DEXAMETHASONE SODIUM PHOSPHATE 10 MG/ML IJ SOLN
10.0000 mg | Freq: Once | INTRAMUSCULAR | Status: AC
  Administered 2014-02-24: 10 mg via INTRAVENOUS

## 2014-02-24 MED ORDER — FLUTICASONE PROPIONATE 50 MCG/ACT NA SUSP: 1.0000 | Freq: Every day | NASAL | Status: DC | PRN

## 2014-02-24 MED ORDER — METOCLOPRAMIDE HCL 5 MG/ML IJ SOLN: 5.0000 mg | Freq: Three times a day (TID) | INTRAMUSCULAR | Status: DC | PRN

## 2014-02-24 MED ORDER — MIDAZOLAM HCL 2 MG/2ML IJ SOLN
INTRAMUSCULAR | Status: AC
  Filled 2014-02-24: qty 2

## 2014-02-24 MED ORDER — ALUM & MAG HYDROXIDE-SIMETH 200-200-20 MG/5ML PO SUSP: 30.0000 mL | ORAL | Status: DC | PRN

## 2014-02-24 MED ORDER — POLYETHYLENE GLYCOL 3350 17 G PO PACK
17.0000 g | PACK | Freq: Two times a day (BID) | ORAL | Status: DC
  Administered 2014-02-24 – 2014-02-26 (×4): 17 g via ORAL

## 2014-02-24 MED ORDER — BUPIVACAINE IN DEXTROSE 0.75-8.25 % IT SOLN
INTRATHECAL | Status: DC | PRN
  Administered 2014-02-24: 2 mL via INTRATHECAL

## 2014-02-24 MED ORDER — METHOCARBAMOL 1000 MG/10ML IJ SOLN
500.0000 mg | Freq: Four times a day (QID) | INTRAVENOUS | Status: DC | PRN
  Administered 2014-02-24: 500 mg via INTRAVENOUS
  Filled 2014-02-24 (×2): qty 5

## 2014-02-24 MED ORDER — EPHEDRINE SULFATE 50 MG/ML IJ SOLN
INTRAMUSCULAR | Status: AC
  Filled 2014-02-24: qty 1

## 2014-02-24 MED ORDER — CHLORHEXIDINE GLUCONATE 4 % EX LIQD: 60.0000 mL | Freq: Once | CUTANEOUS | Status: DC

## 2014-02-24 MED ORDER — HYDROMORPHONE HCL 1 MG/ML IJ SOLN
0.5000 mg | INTRAMUSCULAR | Status: DC | PRN
  Administered 2014-02-24 – 2014-02-25 (×6): 1 mg via INTRAVENOUS
  Filled 2014-02-24 (×6): qty 1

## 2014-02-24 MED ORDER — BUPIVACAINE-EPINEPHRINE (PF) 0.25% -1:200000 IJ SOLN
INTRAMUSCULAR | Status: DC | PRN
  Administered 2014-02-24: 30 mL

## 2014-02-24 MED ORDER — DOCUSATE SODIUM 100 MG PO CAPS
100.0000 mg | ORAL_CAPSULE | Freq: Two times a day (BID) | ORAL | Status: DC
  Administered 2014-02-24 – 2014-02-26 (×4): 100 mg via ORAL

## 2014-02-24 MED ORDER — PROMETHAZINE HCL 25 MG/ML IJ SOLN: 6.2500 mg | INTRAMUSCULAR | Status: DC | PRN

## 2014-02-24 MED ORDER — SODIUM CHLORIDE 0.9 % IV SOLN
INTRAVENOUS | Status: DC
  Administered 2014-02-24 – 2014-02-25 (×2): via INTRAVENOUS
  Filled 2014-02-24 (×7): qty 1000

## 2014-02-24 MED ORDER — FENTANYL CITRATE 0.05 MG/ML IJ SOLN
INTRAMUSCULAR | Status: AC
  Filled 2014-02-24: qty 2

## 2014-02-24 MED ORDER — ALPRAZOLAM 1 MG PO TABS
1.0000 mg | ORAL_TABLET | Freq: Three times a day (TID) | ORAL | Status: DC | PRN
  Administered 2014-02-25 (×3): 1 mg via ORAL
  Filled 2014-02-24 (×3): qty 1

## 2014-02-24 SURGICAL SUPPLY — 56 items
ADH SKN CLS APL DERMABOND .7 (GAUZE/BANDAGES/DRESSINGS) ×1
BAG SPEC THK2 15X12 ZIP CLS (MISCELLANEOUS)
BAG ZIPLOCK 12X15 (MISCELLANEOUS) IMPLANT
BANDAGE ELASTIC 6 VELCRO ST LF (GAUZE/BANDAGES/DRESSINGS) ×3 IMPLANT
BANDAGE ESMARK 6X9 LF (GAUZE/BANDAGES/DRESSINGS) ×1 IMPLANT
BLADE SAW SGTL 13.0X1.19X90.0M (BLADE) ×3 IMPLANT
BNDG CMPR 9X6 STRL LF SNTH (GAUZE/BANDAGES/DRESSINGS) ×1
BNDG ESMARK 6X9 LF (GAUZE/BANDAGES/DRESSINGS) ×3
BONE CEMENT GENTAMICIN (Cement) ×6 IMPLANT
BOWL SMART MIX CTS (DISPOSABLE) ×3 IMPLANT
CAPT KNEE TOTAL 3 ATTUNE ×2 IMPLANT
CEMENT BONE GENTAMICIN 40 (Cement) IMPLANT
CUFF TOURN SGL QUICK 34 (TOURNIQUET CUFF) ×3
CUFF TRNQT CYL 34X4X40X1 (TOURNIQUET CUFF) ×1 IMPLANT
DECANTER SPIKE VIAL GLASS SM (MISCELLANEOUS) ×3 IMPLANT
DERMABOND ADVANCED (GAUZE/BANDAGES/DRESSINGS) ×2
DERMABOND ADVANCED .7 DNX12 (GAUZE/BANDAGES/DRESSINGS) IMPLANT
DRAPE EXTREMITY T 121X128X90 (DRAPE) ×3 IMPLANT
DRAPE POUCH INSTRU U-SHP 10X18 (DRAPES) ×3 IMPLANT
DRAPE U-SHAPE 47X51 STRL (DRAPES) ×3 IMPLANT
DRSG AQUACEL AG ADV 3.5X10 (GAUZE/BANDAGES/DRESSINGS) ×3 IMPLANT
DURAPREP 26ML APPLICATOR (WOUND CARE) ×6 IMPLANT
ELECT REM PT RETURN 9FT ADLT (ELECTROSURGICAL) ×3
ELECTRODE REM PT RTRN 9FT ADLT (ELECTROSURGICAL) ×1 IMPLANT
FACESHIELD WRAPAROUND (MASK) ×15 IMPLANT
FACESHIELD WRAPAROUND OR TEAM (MASK) ×5 IMPLANT
GLOVE BIOGEL PI IND STRL 7.5 (GLOVE) ×1 IMPLANT
GLOVE BIOGEL PI IND STRL 8.5 (GLOVE) ×1 IMPLANT
GLOVE BIOGEL PI INDICATOR 7.5 (GLOVE) ×2
GLOVE BIOGEL PI INDICATOR 8.5 (GLOVE) ×2
GLOVE ECLIPSE 8.0 STRL XLNG CF (GLOVE) ×3 IMPLANT
GLOVE ORTHO TXT STRL SZ7.5 (GLOVE) ×6 IMPLANT
GOWN SPEC L3 XXLG W/TWL (GOWN DISPOSABLE) ×3 IMPLANT
GOWN STRL REUS W/TWL LRG LVL3 (GOWN DISPOSABLE) ×3 IMPLANT
HANDPIECE INTERPULSE COAX TIP (DISPOSABLE) ×3
KIT BASIN OR (CUSTOM PROCEDURE TRAY) ×3 IMPLANT
LIQUID BAND (GAUZE/BANDAGES/DRESSINGS) ×3 IMPLANT
MANIFOLD NEPTUNE II (INSTRUMENTS) ×3 IMPLANT
NDL SAFETY ECLIPSE 18X1.5 (NEEDLE) ×1 IMPLANT
NEEDLE HYPO 18GX1.5 SHARP (NEEDLE) ×3
PACK TOTAL JOINT (CUSTOM PROCEDURE TRAY) ×3 IMPLANT
POSITIONER SURGICAL ARM (MISCELLANEOUS) ×3 IMPLANT
SET HNDPC FAN SPRY TIP SCT (DISPOSABLE) ×1 IMPLANT
SET PAD KNEE POSITIONER (MISCELLANEOUS) ×3 IMPLANT
SUCTION FRAZIER 12FR DISP (SUCTIONS) ×3 IMPLANT
SUT MNCRL AB 4-0 PS2 18 (SUTURE) ×3 IMPLANT
SUT VIC AB 1 CT1 36 (SUTURE) ×3 IMPLANT
SUT VIC AB 2-0 CT1 27 (SUTURE) ×9
SUT VIC AB 2-0 CT1 TAPERPNT 27 (SUTURE) ×3 IMPLANT
SUT VLOC 180 0 24IN GS25 (SUTURE) ×3 IMPLANT
SYR 50ML LL SCALE MARK (SYRINGE) ×3 IMPLANT
TOWEL OR 17X26 10 PK STRL BLUE (TOWEL DISPOSABLE) ×3 IMPLANT
TOWEL OR NON WOVEN STRL DISP B (DISPOSABLE) IMPLANT
TRAY FOLEY CATH 16FRSI W/METER (SET/KITS/TRAYS/PACK) ×2 IMPLANT
WATER STERILE IRR 1500ML POUR (IV SOLUTION) ×3 IMPLANT
WRAP KNEE MAXI GEL POST OP (GAUZE/BANDAGES/DRESSINGS) ×3 IMPLANT

## 2014-02-24 NOTE — Anesthesia Preprocedure Evaluation (Addendum)
Anesthesia Evaluation  Patient identified by MRN, date of birth, ID band Patient awake    Reviewed: Allergy & Precautions, H&P , NPO status , Patient's Chart, lab work & pertinent test results, reviewed documented beta blocker date and time   Airway Mallampati: II  TM Distance: >3 FB Neck ROM: full    Dental  (+) Partial Upper, Teeth Intact, Dental Advidsory Given   Pulmonary shortness of breath and with exertion, sleep apnea , former smoker,          Cardiovascular hypertension,     Neuro/Psych  Neuromuscular disease    GI/Hepatic GERD-  Medicated and Controlled,  Endo/Other  diabetesMorbid obesity  Renal/GU      Musculoskeletal   Abdominal   Peds  Hematology   Anesthesia Other Findings   Reproductive/Obstetrics                             Anesthesia Physical  Anesthesia Plan  ASA: III  Anesthesia Plan: Spinal   Post-op Pain Management:    Induction:   Airway Management Planned: Simple Face Mask  Additional Equipment:   Intra-op Plan:   Post-operative Plan:   Informed Consent: I have reviewed the patients History and Physical, chart, labs and discussed the procedure including the risks, benefits and alternatives for the proposed anesthesia with the patient or authorized representative who has indicated his/her understanding and acceptance.   Dental Advisory Given  Plan Discussed with: Anesthesiologist, CRNA and Surgeon  Anesthesia Plan Comments:        Anesthesia Quick Evaluation

## 2014-02-24 NOTE — Op Note (Signed)
NAME:  Victor [name]                      MEDICAL RECORD NO.:  [id number]                             FACILITY:  [facility]      PHYSICIAN:  Pietro Cassis. Alvan Dame, M.D.  DATE OF BIRTH:  [birth year]      DATE OF PROCEDURE:  02/24/2014                                     OPERATIVE REPORT         PREOPERATIVE DIAGNOSIS:  Right knee osteoarthritis.      POSTOPERATIVE DIAGNOSIS:  Right knee osteoarthritis.      FINDINGS:  The patient was noted to have complete loss of cartilage and   bone-on-bone arthritis with associated osteophytes in the medial and patellofemoral compartments of   the knee with a significant synovitis and associated effusion.      PROCEDURE:  Right total knee replacement.      COMPONENTS USED:  DePuy Attune rotating platform posterior stabilized knee   system, a size 7 femur, 7 tibia, 7 mm AOX PS insert, and 41 patellar   button.      SURGEON:  Pietro Cassis. Alvan Dame, M.D.      ASSISTANT:  Danae Orleans, PA-C.      ANESTHESIA:  Spinal.      SPECIMENS:  None.      COMPLICATION:  None.      DRAINS:  None.  EBL: <100cc      TOURNIQUET TIME:   Total Tourniquet Time Documented: Thigh (Right) - 46 minutes Total: Thigh (Right) - 46 minutes  .      The patient was stable to the recovery room.      INDICATION FOR PROCEDURE:  Victor [name] is a 58 y.o. male patient of   mine.  The patient had been seen, evaluated, and treated conservatively in the   office with medication, activity modification, and injections.  The patient had   radiographic changes of bone-on-bone arthritis with endplate sclerosis and osteophytes noted.      The patient failed conservative measures including medication, injections, and activity modification, and at this point was ready for more definitive measures.   Based on the radiographic changes and failed conservative measures, the patient   decided to proceed with total knee replacement.  Risks of infection,   DVT, component failure, need for  revision surgery, postop course, and   expectations were all   discussed and reviewed.  Consent was obtained for benefit of pain   relief.      PROCEDURE IN DETAIL:  The patient was brought to the operative theater.   Once adequate anesthesia, preoperative antibiotics, 2 gm of Ancef administered, the patient was positioned supine with the right thigh tourniquet placed.  The  right lower extremity was prepped and draped in sterile fashion.  A time-   out was performed identifying the patient, planned procedure, and   extremity.      The right lower extremity was placed in the [facility] leg holder.  The leg was   exsanguinated, tourniquet elevated to 250 mmHg.  A midline incision was   made followed by median parapatellar arthrotomy.  Following initial   exposure, attention  was first directed to the patella.  Precut   measurement was noted to be 25 mm.  I resected down to 14-15 mm and used a   41 patellar button to restore patellar height as well as cover the cut   surface.      The lug holes were drilled and a metal shim was placed to protect the   patella from retractors and saw blades.      At this point, attention was now directed to the femur.  The femoral   canal was opened with a drill, irrigated to try to prevent fat emboli.  An   intramedullary rod was passed at 5 degrees valgus, 9 mm of bone was   resected off the distal femur.  Following this resection, the tibia was   subluxated anteriorly.  Using the extramedullary guide, 4 mm of bone was resected off   the proximal medial tibia.  We confirmed the gap would be   stable medially and laterally with a 6 mm insert as well as confirmed   the cut was perpendicular in the coronal plane, checking with an alignment rod.      Once this was done, I sized the femur to be a size 7 in the anterior-   posterior dimension, chose a standard component based on medial and   lateral dimension.  The size 7 rotation block was then pinned in    position anterior referenced using the tensioning device to set rotation and match flexion and extension gaps.  The   anterior, posterior, and  chamfer cuts were made without difficulty nor   notching making certain that I was along the anterior cortex to help   with flexion gap stability.      The final box cut was made off the lateral aspect of distal femur.      At this point, the tibia was sized to be a size 7, the size 7 tray was   then pinned in position through the medial third of the tubercle,   drilled, and keel punched.  Trial reduction was now carried with a 7 femur,  7 tibia, a size 7 mm insert, and the 41 patella botton.  The knee was brought to   extension, full extension with good flexion stability with the patella   tracking through the trochlea without application of pressure.  Given   all these findings, the trial components removed.  Final components were   opened and cement was mixed.  The knee was irrigated with normal saline   solution and pulse lavage.  The synovial lining was   then injected with 30cc of 0.25% Marcaine with epinephrine and 1 cc of Toradol plus 30cc of NS for a   total of 61 cc.      The knee was irrigated.  Final implants were then cemented onto clean and   dried cut surfaces of bone with the knee brought to extension with a 7   mm trial insert.      Once the cement had fully cured, the excess cement was removed   throughout the knee.  I confirmed I was satisfied with the range of   motion and stability, and the final size 7 mm PS AOX insert was chosen.  It was   placed into the knee.      The tourniquet had been let down at 46 minutes.  No significant   hemostasis required.  The   extensor mechanism was then reapproximated  using #1 Vicryl and #0 V-lock sutures with the knee   in flexion.  The   remaining wound was closed with 2-0 Vicryl and running 4-0 Monocryl.   The knee was cleaned, dried, dressed sterilely using Dermabond and   Aquacel  dressing.  The patient was then   brought to recovery room in stable condition, tolerating the procedure   well.   Please note that Physician Assistant, Danae Orleans, was present for the entirety of the case, and was utilized for pre-operative positioning, peri-operative retractor management, general facilitation of the procedure.  He was also utilized for primary wound closure at the end of the case.              Pietro Cassis Alvan Dame, M.D.    02/24/2014 2:11 PM

## 2014-02-24 NOTE — Anesthesia Postprocedure Evaluation (Signed)
  Anesthesia Post-op Note  Patient: Victor [name]  Procedure(s) Performed: Procedure(s) (LRB): TOTAL RIGHT KNEE ARTHROPLASTY (Right)  Patient Location: PACU  Anesthesia Type: Spinal  Level of Consciousness: awake and alert   Airway and Oxygen Therapy: Patient Spontanous Breathing  Post-op Pain: mild  Post-op Assessment: Post-op Vital signs reviewed, Patient's Cardiovascular Status Stable, Respiratory Function Stable, Patent Airway and No signs of Nausea or vomiting  Last Vitals:  Filed Vitals:   02/24/14 1532  BP: 144/77  Pulse: 59  Temp: 36.4 C  Resp:     Post-op Vital Signs: stable   Complications: No apparent anesthesia complications

## 2014-02-24 NOTE — Interval H&P Note (Signed)
History and Physical Interval Note:  02/24/2014 11:22 AM  Victor [name]  has presented today for surgery, with the diagnosis of RIGHT KNEE OA   The various methods of treatment have been discussed with the patient and family. After consideration of risks, benefits and other options for treatment, the patient has consented to  Procedure(s): TOTAL RIGHT KNEE ARTHROPLASTY (Right) as a surgical intervention .  The patient's history has been reviewed, patient examined, no change in status, stable for surgery.  I have reviewed the patient's chart and labs.  Questions were answered to the patient's satisfaction.     Mauri Pole

## 2014-02-24 NOTE — Plan of Care (Signed)
Problem: Consults Goal: Diagnosis- Total Joint Replacement Primary Total Knee     

## 2014-02-24 NOTE — Transfer of Care (Signed)
Immediate Anesthesia Transfer of Care Note  Patient: Victor [name]  Procedure(s) Performed: Procedure(s): TOTAL RIGHT KNEE ARTHROPLASTY (Right)  Patient Location: PACU  Anesthesia Type:Spinal  Level of Consciousness:   Airway & Oxygen Therapy: Patient Spontanous Breathing and Patient connected to nasal cannula oxygen  Post-op Assessment: Report given to PACU RN and Post -op Vital signs reviewed and stable  Post vital signs: Reviewed and stable  Complications: No apparent anesthesia complications

## 2014-02-24 NOTE — Anesthesia Procedure Notes (Signed)
Spinal Patient location during procedure: OR Start time: 02/24/2014 12:31 PM Staffing Anesthesiologist: Montez Hageman Resident/CRNA: [place] Firas Guardado C Performed by: resident/CRNA  Preanesthetic Checklist Completed: patient identified, site marked, surgical consent, pre-op evaluation, timeout performed, IV checked, risks and benefits discussed and monitors and equipment checked Spinal Block Patient position: sitting Prep: Betadine and site prepped and draped Patient monitoring: heart rate, cardiac monitor, continuous pulse ox and blood pressure Approach: right paramedian Location: L2-3 Injection technique: single-shot Needle Needle type: Sprotte  Needle gauge: 24 G Needle length: 10 cm Assessment Sensory level: T6 Additional Notes Negative heme, 1 attempt, pt tolerated well

## 2014-02-24 NOTE — Progress Notes (Signed)
Utilization review completed.  

## 2014-02-25 LAB — CBC
HCT: 46.5 % (ref 39.0–52.0)
Hemoglobin: 15.5 g/dL (ref 13.0–17.0)
MCH: 30.5 pg (ref 26.0–34.0)
MCHC: 33.3 g/dL (ref 30.0–36.0)
MCV: 91.5 fL (ref 78.0–100.0)
PLATELETS: 188 10*3/uL (ref 150–400)
RBC: 5.08 MIL/uL (ref 4.22–5.81)
RDW: 14.2 % (ref 11.5–15.5)
WBC: 14.2 10*3/uL — AB (ref 4.0–10.5)

## 2014-02-25 LAB — BASIC METABOLIC PANEL
ANION GAP: 7 (ref 5–15)
BUN: 14 mg/dL (ref 6–23)
CHLORIDE: 101 meq/L (ref 96–112)
CO2: 24 mmol/L (ref 19–32)
CREATININE: 0.89 mg/dL (ref 0.50–1.35)
Calcium: 8.4 mg/dL (ref 8.4–10.5)
Glucose, Bld: 133 mg/dL — ABNORMAL HIGH (ref 70–99)
Potassium: 4.5 mmol/L (ref 3.5–5.1)
Sodium: 132 mmol/L — ABNORMAL LOW (ref 135–145)

## 2014-02-25 LAB — GLUCOSE, CAPILLARY
GLUCOSE-CAPILLARY: 142 mg/dL — AB (ref 70–99)
Glucose-Capillary: 118 mg/dL — ABNORMAL HIGH (ref 70–99)
Glucose-Capillary: 125 mg/dL — ABNORMAL HIGH (ref 70–99)

## 2014-02-25 MED ORDER — TIZANIDINE HCL 4 MG PO TABS: 4.0000 mg | ORAL_TABLET | Freq: Four times a day (QID) | ORAL | Status: DC | PRN

## 2014-02-25 MED ORDER — PREDNISONE 5 MG PO KIT: 5.0000 mg | PACK | ORAL | Status: DC

## 2014-02-25 MED ORDER — DSS 100 MG PO CAPS: 100.0000 mg | ORAL_CAPSULE | Freq: Two times a day (BID) | ORAL | Status: DC

## 2014-02-25 MED ORDER — FERROUS SULFATE 325 (65 FE) MG PO TABS
325.0000 mg | ORAL_TABLET | Freq: Three times a day (TID) | ORAL | Status: DC
Start: 2014-02-25 — End: 2015-07-13

## 2014-02-25 MED ORDER — TIZANIDINE HCL 4 MG PO TABS
4.0000 mg | ORAL_TABLET | Freq: Four times a day (QID) | ORAL | Status: DC | PRN
  Administered 2014-02-25 – 2014-02-26 (×4): 4 mg via ORAL
  Filled 2014-02-25 (×6): qty 1

## 2014-02-25 MED ORDER — OXYCODONE HCL 5 MG PO TABS: 5.0000 mg | ORAL_TABLET | ORAL | Status: DC | PRN

## 2014-02-25 MED ORDER — POLYETHYLENE GLYCOL 3350 17 G PO PACK: 17.0000 g | PACK | Freq: Two times a day (BID) | ORAL | Status: DC

## 2014-02-25 MED ORDER — SPIRITUS FRUMENTI
1.0000 | Freq: Every evening | ORAL | Status: DC | PRN
  Filled 2014-02-25 (×2): qty 1

## 2014-02-25 MED ORDER — ASPIRIN 325 MG PO TBEC: 325.0000 mg | DELAYED_RELEASE_TABLET | Freq: Two times a day (BID) | ORAL | Status: AC

## 2014-02-25 NOTE — Progress Notes (Signed)
     Subjective: 1 Day Post-Op Procedure(s) (LRB): TOTAL RIGHT KNEE ARTHROPLASTY (Right)   Patient reports pain as mild, pain controlled. No events throughout the night. Doesn't like the way the Robaxin is making him feel. Ready to be discharged home if he does well with PT and pain stays controlled.   Objective:   VITALS:   Filed Vitals:   02/25/14 0447  BP: 168/84  Pulse: 60  Temp: 98 F (36.7 C)  Resp: 17    Dorsiflexion/Plantar flexion intact Incision: dressing C/D/I No cellulitis present Compartment soft  LABS  Recent Labs  02/25/14 0428  HGB 15.5  HCT 46.5  WBC 14.2*  PLT 188     Recent Labs  02/25/14 0428  NA 132*  K 4.5  BUN 14  CREATININE 0.89  GLUCOSE 133*     Assessment/Plan: 1 Day Post-Op Procedure(s) (LRB): TOTAL RIGHT KNEE ARTHROPLASTY (Right) Foley cath d/c'ed Advance diet Up with therapy D/C IV fluids Discharge home with home health. Follow up in 2 weeks at [facility]. Follow up with OLIN,Gerard Cantara D in 2 weeks.  Contact information:  [facility] [address], [place] [address] [phone]    Morbid Obesity (BMI >40)  Estimated body mass index is 40.94 kg/(m^2) as calculated from the following:   Height as of this encounter: 5\' 5"  (1.651 m).   Weight as of this encounter: 111.585 kg (246 lb). Patient also counseled that weight may inhibit the healing process Patient counseled that losing weight will help with future health issues         [place]. Ethelda Deangelo   PAC  02/25/2014, 8:38 AM

## 2014-02-25 NOTE — Evaluation (Signed)
Occupational Therapy Evaluation Patient Details Name: Victor [name] MRN: [id number] DOB: [birth year] Today's Date: 02/25/2014    History of Present Illness R TKA   Clinical Impression   This 58 year old man was admitted for R TKA.  All education was completed.  No further OT is needed at this time.    Follow Up Recommendations  No OT follow up;Supervision/Assistance - 24 hour    Equipment Recommendations  3 in 1 bedside comode (delivered)    Recommendations for Other Services       Precautions / Restrictions Precautions Precautions: Knee Restrictions Other Position/Activity Restrictions: WBAT      Mobility Bed Mobility                  Transfers   Equipment used: Rolling walker (2 wheeled) Transfers: Sit to/from Stand Sit to Stand: Min assist         General transfer comment: cues for UE/LE placement    Balance                                            ADL Overall ADL's : Needs assistance/impaired     Grooming: Supervision/safety;Standing                   Toilet Transfer: Minimal assistance;Comfort height toilet;Grab bars   Toileting- Clothing Manipulation and Hygiene: Supervision/safety;Sitting/lateral lean         General ADL Comments: pt is able to perform UB adls with set up.  He needs min A for LB bathing and mod A for LB dressing:  wife will assist him.  Reviewed precaution of no pillow under operated knee--he has been doing this for comfort for back.  Educated about tub readiness.  He will wash at sink until he can safety step into tub and use borrowed seat     Vision                     Perception     Praxis      Pertinent Vitals/Pain Pain Score: 7  Pain Location: R knee and back Pain Descriptors / Indicators: Aching;Sore Pain Intervention(s): Limited activity within patient's tolerance     Hand Dominance     Extremity/Trunk Assessment Upper Extremity Assessment Upper Extremity  Assessment: Overall WFL for tasks assessed           Communication Communication Communication: No difficulties   Cognition Arousal/Alertness: Awake/alert Behavior During Therapy: WFL for tasks assessed/performed Overall Cognitive Status: Within Functional Limits for tasks assessed                     General Comments       Exercises       Shoulder Instructions      Home Living Family/patient expects to be discharged to:: Private residence Living Arrangements: Spouse/significant other Available Help at Discharge: Family               Bathroom Shower/Tub: Tub/shower unit Shower/tub characteristics: Architectural technologist: Standard     Home Equipment: Civil engineer, contracting          Prior Functioning/Environment Level of Independence: Independent             OT Diagnosis: Acute pain   OT Problem List:     OT Treatment/Interventions:      OT Goals(Current goals can be  found in the care plan section)    OT Frequency:     Barriers to D/C:            Co-evaluation              End of Session    Activity Tolerance: Patient tolerated treatment well Patient left: in chair;with call bell/phone within reach (PT)   Time: 8563-1497 OT Time Calculation (min): 9 min Charges:  OT General Charges $OT Visit: 1 Procedure OT Evaluation $Initial OT Evaluation Tier I: 1 Procedure G-Codes:    Jennefer Kopp Mar 11, 2014, 3:06 PM  Lesle Chris, OTR/L [phone] 03/11/14

## 2014-02-25 NOTE — Progress Notes (Signed)
Physical Therapy Treatment Patient Details Name: Victor [name] MRN: [id number] DOB: Jan 14, 1956 Today's Date: 13-Mar-2014    History of Present Illness R TKA    PT Comments    Motivated and progressing well  Follow Up Recommendations  Home health PT     Equipment Recommendations  Rolling walker with 5" wheels    Recommendations for Other Services OT consult     Precautions / Restrictions Precautions Precautions: Knee Restrictions Weight Bearing Restrictions: No Other Position/Activity Restrictions: WBAT    Mobility  Bed Mobility Overal bed mobility: Needs Assistance Bed Mobility: Sit to Supine       Sit to supine: Min guard      Transfers Overall transfer level: Needs assistance Equipment used: Rolling walker (2 wheeled) Transfers: Sit to/from Stand Sit to Stand: Min assist         General transfer comment: cues for UE/LE placement  Ambulation/Gait Ambulation/Gait assistance: Min guard Ambulation Distance (Feet): 144 Feet Assistive device: Rolling walker (2 wheeled) Gait Pattern/deviations: Step-to pattern;Decreased step length - right;Decreased step length - left;Shuffle;Trunk flexed     General Gait Details: cues for posture, sequence and position from [employer]            Wheelchair Mobility    Modified Rankin (Stroke Patients Only)       Balance                                    Cognition Arousal/Alertness: Awake/alert Behavior During Therapy: WFL for tasks assessed/performed Overall Cognitive Status: Within Functional Limits for tasks assessed                      Exercises Total Joint Exercises Ankle Circles/Pumps: AROM;Both;15 reps;Supine Quad Sets: AROM;Both;10 reps;Supine Heel Slides: AAROM;Right;10 reps;Supine Straight Leg Raises: AAROM;AROM;Right;15 reps;Supine    General Comments        Pertinent Vitals/Pain Pain Assessment: 0-10 Pain Score: 7  Pain Location: R knee and back Pain  Descriptors / Indicators: Aching;Sore Pain Intervention(s): Limited activity within patient's tolerance    Home Living Family/patient expects to be discharged to:: Private residence Living Arrangements: Spouse/significant other Available Help at Discharge: Family         Home Equipment: Shower seat      Prior Function Level of Independence: Independent          PT Goals (current goals can now be found in the care plan section) Acute Rehab PT Goals Patient Stated Goal: Resume previous lifestyle with decreased pain PT Goal Formulation: With patient Time For Goal Achievement: 03/04/14 Potential to Achieve Goals: Good Progress towards PT goals: Progressing toward goals    Frequency  7X/week    PT Plan Current plan remains appropriate    Co-evaluation             End of Session Equipment Utilized During Treatment: Gait belt Activity Tolerance: Patient tolerated treatment well Patient left: with call bell/phone within reach;in bed     Time: 1447-1515 PT Time Calculation (min) (ACUTE ONLY): 28 min  Charges:  $Gait Training: 8-22 mins $Therapeutic Exercise: 8-22 mins                    G Codes:      Suesan Mohrmann 03-13-14, 3:46 PM

## 2014-02-25 NOTE — Evaluation (Signed)
Physical Therapy Evaluation Patient Details Name: Victor [name] MRN: [id number] DOB: 1955/05/23 Today's Date: 02/25/2014   History of Present Illness  R TKR  Clinical Impression  Pt s/p R TKR presents with decreased R LE strength/ROM and post op pain limiting functional mobility.  Pt should progress to d/c home with family assist and HHPT follow up.    Follow Up Recommendations Home health PT    Equipment Recommendations  Rolling walker with 5" wheels    Recommendations for Other Services OT consult     Precautions / Restrictions Precautions Precautions: Knee Restrictions Weight Bearing Restrictions: No Other Position/Activity Restrictions: WBAT      Mobility  Bed Mobility               General bed mobility comments: NT - OOB with nursing  Transfers Overall transfer level: Needs assistance Equipment used: Rolling walker (2 wheeled) Transfers: Sit to/from Stand Sit to Stand: Min assist         General transfer comment: cues for LE management and use of UEs to self assist  Ambulation/Gait Ambulation/Gait assistance: Min assist Ambulation Distance (Feet): 84 Feet Assistive device: Rolling walker (2 wheeled) Gait Pattern/deviations: Step-to pattern;Step-through pattern;Decreased step length - right;Decreased step length - left;Shuffle;Trunk flexed     General Gait Details: cues for posture, sequence and position from [employer]            Wheelchair Mobility    Modified Rankin (Stroke Patients Only)       Balance                                             Pertinent Vitals/Pain Pain Assessment: 0-10 Pain Score: 7  Pain Location: R knee Pain Descriptors / Indicators: Sore Pain Intervention(s): Limited activity within patient's tolerance;Monitored during session;Premedicated before session;Ice applied    Home Living Family/patient expects to be discharged to:: Private residence Living Arrangements:  Spouse/significant other Available Help at Discharge: Family Type of Home: House Home Access: Stairs to enter Entrance Stairs-Rails: Psychiatric nurse of Steps: 3 Home Layout: One level Home Equipment: [place] - single point;Wheelchair - manual      Prior Function Level of Independence: Independent               Hand Dominance   Dominant Hand: Right    Extremity/Trunk Assessment   Upper Extremity Assessment: Overall WFL for tasks assessed           Lower Extremity Assessment: RLE deficits/detail RLE Deficits / Details: 3/5 quads with AAROM at knee -10 - 70    Cervical / Trunk Assessment: Normal  Communication   Communication: No difficulties  Cognition Arousal/Alertness: Awake/alert Behavior During Therapy: WFL for tasks assessed/performed Overall Cognitive Status: Within Functional Limits for tasks assessed                      General Comments      Exercises Total Joint Exercises Ankle Circles/Pumps: AROM;Both;15 reps;Supine Quad Sets: AROM;Both;10 reps;Supine Heel Slides: AAROM;Right;10 reps;Supine Straight Leg Raises: AAROM;AROM;Right;15 reps;Supine      Assessment/Plan    PT Assessment Patient needs continued PT services  PT Diagnosis Difficulty walking   PT Problem List Decreased strength;Decreased range of motion;Decreased activity tolerance;Decreased mobility;Decreased knowledge of use of DME;Pain  PT Treatment Interventions DME instruction;Gait training;Stair training;Functional mobility training;Therapeutic activities;Therapeutic exercise;Patient/family education   PT  Goals (Current goals can be found in the Care Plan section) Acute Rehab PT Goals Patient Stated Goal: Resume previous lifestyle with decreased pain PT Goal Formulation: With patient Time For Goal Achievement: 03/04/14 Potential to Achieve Goals: Good    Frequency 7X/week   Barriers to discharge        Co-evaluation               End of  Session Equipment Utilized During Treatment: Gait belt Activity Tolerance: Patient tolerated treatment well Patient left: in chair;with call bell/phone within reach;with family/visitor present Nurse Communication: Mobility status         Time: 1001-1030 PT Time Calculation (min) (ACUTE ONLY): 29 min   Charges:   PT Evaluation $Initial PT Evaluation Tier I: 1 Procedure PT Treatments $Gait Training: 8-22 mins $Therapeutic Exercise: 8-22 mins   PT G Codes:        Karla Vines 2014/03/21, 12:18 PM

## 2014-02-25 NOTE — Care Management Note (Signed)
    Page 1 of 2   02/25/2014     2:42:02 PM CARE MANAGEMENT NOTE 02/25/2014  Patient:  Victor [name], Victor [name]   Account Number:  [id number]  Date Initiated:  02/25/2014  Documentation initiated by:  [facility]  Subjective/Objective Assessment:   adm: TOTAL RIGHT KNEE ARTHROPLASTY (Right)     Action/Plan:   discharge planning   Anticipated DC Date:  02/25/2014   Anticipated DC Plan:  [place]  CM consult      [facility]  HOME [facility]   Choice offered to / List presented to:  C-1 Patient   DME arranged  3-N-1  Victor [name]      DME agency  [place] arranged  [place]   Status of service:  Completed, signed off Medicare Important Message given?   (If response is "NO", the following Medicare IM given date fields will be blank) Date Medicare IM given:   Medicare IM given by:   Date Additional Medicare IM given:   Additional Medicare IM given by:    Discharge Disposition:  [place]  Per UR Regulation:    If discussed at Long Length of Stay Meetings, dates discussed:    Comments:  02/25/14 08:00 CM met with pt in room to offer choice of home health agency.  Pt chooses [facility] to render HHPT. Address and contact informaiton verified with pt with addition of wife, Victor [name] [phone].  Referral given to [facility] rep, Victor [name] (on unit) with Victor [name]'s number.  [facility] DME rep, Victor [name] delivering 3n1 and rolling walker to room prior to discharge.  No other CM needs were communicated. Victor [name] BSN, [place].

## 2014-02-26 ENCOUNTER — Encounter (HOSPITAL_COMMUNITY): Payer: Self-pay | Admitting: Orthopedic Surgery

## 2014-02-26 LAB — CBC
HCT: 43.3 % (ref 39.0–52.0)
Hemoglobin: 14.1 g/dL (ref 13.0–17.0)
MCH: 30.3 pg (ref 26.0–34.0)
MCHC: 32.6 g/dL (ref 30.0–36.0)
MCV: 93.1 fL (ref 78.0–100.0)
Platelets: 190 10*3/uL (ref 150–400)
RBC: 4.65 MIL/uL (ref 4.22–5.81)
RDW: 14.7 % (ref 11.5–15.5)
WBC: 11.9 10*3/uL — ABNORMAL HIGH (ref 4.0–10.5)

## 2014-02-26 LAB — BASIC METABOLIC PANEL
Anion gap: 7 (ref 5–15)
BUN: 16 mg/dL (ref 6–23)
CALCIUM: 8.8 mg/dL (ref 8.4–10.5)
CO2: 29 mmol/L (ref 19–32)
Chloride: 100 mEq/L (ref 96–112)
Creatinine, Ser: 0.85 mg/dL (ref 0.50–1.35)
GFR calc Af Amer: >90 mL/min (ref >90)
Glucose, Bld: 130 mg/dL — ABNORMAL HIGH (ref 70–99)
Potassium: 4.7 mmol/L (ref 3.5–5.1)
SODIUM: 136 mmol/L (ref 135–145)

## 2014-02-26 LAB — GLUCOSE, CAPILLARY: Glucose-Capillary: 141 mg/dL — ABNORMAL HIGH (ref 70–99)

## 2014-02-26 NOTE — Progress Notes (Signed)
     Subjective: 2 Days Post-Op Procedure(s) (LRB): TOTAL RIGHT KNEE ARTHROPLASTY (Right)   Patient reports pain as moderate, pain controlled. No events throughout the night. Working well with PT this morning and has already worked on stairs.  Ready to be discharged home.  Objective:   VITALS:   Filed Vitals:   02/26/14 0529  BP: 127/79  Pulse: 60  Temp: 97.9 F (36.6 C)  Resp: 16    Dorsiflexion/Plantar flexion intact Incision: dressing C/D/I No cellulitis present Compartment soft  LABS  Recent Labs  02/25/14 0428 02/26/14 0420  HGB 15.5 14.1  HCT 46.5 43.3  WBC 14.2* 11.9*  PLT 188 190     Recent Labs  02/25/14 0428 02/26/14 0420  NA 132* 136  K 4.5 4.7  BUN 14 16  CREATININE 0.89 0.85  GLUCOSE 133* 130*     Assessment/Plan: 2 Days Post-Op Procedure(s) (LRB): TOTAL RIGHT KNEE ARTHROPLASTY (Right) Up with therapy Discharge home with home health  Follow up in 2 weeks at [facility]. Follow up with OLIN,Lonie Rummell D in 2 weeks.  Contact information:  [facility] [address] [place] Des[place]   PAC  02/26/2014, 10:18 AM

## 2014-02-26 NOTE — Progress Notes (Signed)
RN reviewed discharge instructions with patient and family. All questions answered.  Paperwork and prescriptions given.   NT rolled patient down in wheelchair to family car.  

## 2014-02-26 NOTE — Progress Notes (Signed)
Physical Therapy Treatment Patient Details Name: Victor [name] MRN: [id number] DOB: 1955/08/06 Today's Date: 26-Mar-2014    History of Present Illness R TKA    PT Comments    Steady progress but with increased fatigue from last session.  Pt eager for d./c home  Follow Up Recommendations  Home health PT     Equipment Recommendations  Rolling walker with 5" wheels    Recommendations for Other Services OT consult     Precautions / Restrictions Precautions Precautions: Knee Restrictions Weight Bearing Restrictions: No Other Position/Activity Restrictions: WBAT    Mobility  Bed Mobility                  Transfers Overall transfer level: Needs assistance Equipment used: Rolling walker (2 wheeled) Transfers: Sit to/from Stand Sit to Stand: Supervision         General transfer comment: cues for UE/LE placement  Ambulation/Gait Ambulation/Gait assistance: Supervision Ambulation Distance (Feet): 140 Feet Assistive device: Rolling walker (2 wheeled) Gait Pattern/deviations: Step-through pattern;Shuffle;Trunk flexed     General Gait Details: cues for posture, sequence and position from RW   Stairs Stairs: Yes Stairs assistance: Min assist Stair Management: One rail Right;Step to pattern;Forwards;With crutches Number of Stairs: 8 General stair comments: cues for sequence and foot/crutch placement  Wheelchair Mobility    Modified Rankin (Stroke Patients Only)       Balance                                    Cognition Arousal/Alertness: Awake/alert Behavior During Therapy: WFL for tasks assessed/performed Overall Cognitive Status: Within Functional Limits for tasks assessed                      Exercises Total Joint Exercises Ankle Circles/Pumps: AROM;Both;15 reps;Supine Quad Sets: AROM;Both;10 reps;Supine Heel Slides: AAROM;Right;10 reps;Supine Straight Leg Raises: AAROM;AROM;Right;Supine;10 reps    General  Comments        Pertinent Vitals/Pain Pain Assessment: 0-10 Pain Score: 6  Pain Location: R knee Pain Descriptors / Indicators: Aching;Sore Pain Intervention(s): Limited activity within patient's tolerance;Monitored during session;Premedicated before session;Ice applied    Home Living                      Prior Function            PT Goals (current goals can now be found in the care plan section) Acute Rehab PT Goals Patient Stated Goal: Resume previous lifestyle with decreased pain PT Goal Formulation: With patient Time For Goal Achievement: 03/04/14 Potential to Achieve Goals: Good Progress towards PT goals: Progressing toward goals    Frequency  7X/week    PT Plan Current plan remains appropriate    Co-evaluation             End of Session Equipment Utilized During Treatment: Gait belt Activity Tolerance: Patient tolerated treatment well Patient left: in chair;with call bell/phone within reach;with family/visitor present     Time: 3710-6269 PT Time Calculation (min) (ACUTE ONLY): 27 min  Charges:  $Gait Training: 8-22 mins $Therapeutic Exercise: 8-22 mins                    G Codes:      Victor [name] 2014-03-26, 11:19 AM

## 2014-02-28 NOTE — Discharge Summary (Signed)
Physician Discharge Summary  Patient ID: [name] MRN: [id number] DOB/AGE: January 31, 1956 59 y.o.  Admit date: 02/24/2014 Discharge date: 02/26/2014   Procedures:  Procedure(s) (LRB): TOTAL RIGHT KNEE ARTHROPLASTY (Right)  Attending Physician:  Dr. Paralee Cancel   Admission Diagnoses:   Right knee OA / pain  Discharge Diagnoses:  Principal Problem:   S/P right TKA Active Problems:   Morbid obesity  Past Medical History  Diagnosis Date  . Anxiety   . Hypertension   . GERD (gastroesophageal reflux disease)   . Fibromyalgia   . High cholesterol   . Sleep apnea     "don't use CPAP" (04/15/2013)  . Diabetes mellitus     "diet controlled" (04/15/2013)  . Arthritis     "all over" (04/15/2013)  . Gout   . Back pain, chronic   . Numbness of feet   . DDD (degenerative disc disease), cervical   . DDD (degenerative disc disease), lumbar     HPI: Victor [name], 59 y.o. male, has a history of pain and functional disability in the right knee due to arthritis and has failed non-surgical conservative treatments for greater than 12 weeks to include NSAID's and/or analgesics, corticosteriod injections, viscosupplementation injections and activity modification. Onset of symptoms was gradual, starting years ago with gradually worsening course since that time. The patient noted prior procedures on the knee to include arthroscopy and menisectomy on the right knee(s). Patient currently rates pain in the right knee(s) at 7 out of 10 with activity. Patient has night pain, worsening of pain with activity and weight bearing, pain that interferes with activities of daily living and pain with passive range of motion. Patient has evidence of periarticular osteophytes and joint space narrowing by imaging studies. There is no active infection. Risks, benefits and expectations were discussed with the patient. Risks including but not limited to the risk of anesthesia, blood clots, nerve damage,  blood vessel damage, failure of the prosthesis, infection and up to and including death. Patient understand the risks, benefits and expectations and wishes to proceed with surgery.  PCP: Gara Kroner, MD   Discharged Condition: good  Hospital Course:  Patient underwent the above stated procedure on 02/24/2014. Patient tolerated the procedure well and brought to the recovery room in good condition and subsequently to the floor.  POD #1 BP: 168/84 ; Pulse: 60 ; Temp: 98 F (36.7 C) ; Resp: 17 Patient reports pain as mild, pain controlled. No events throughout the night. Doesn't like the way the Robaxin is making him feel. Dorsiflexion/plantar flexion intact, incision: dressing C/D/I, no cellulitis present and compartment soft.   LABS  Basename    HGB  15.5  HCT  46.5   POD #2  BP: 127/79 ; Pulse: 60 ; Temp: 97.9 F (36.6 C) ; Resp: 16 Patient reports pain as moderate, pain controlled. No events throughout the night. Working well with PT this morning and has already worked on stairs. Ready to be discharged home. Dorsiflexion/plantar flexion intact, incision: dressing C/D/I, no cellulitis present and compartment soft.   LABS  Basename    HGB  14.1  HCT  43.3    Discharge Exam: General appearance: alert, cooperative and no distress Extremities: Homans sign is negative, no sign of DVT, no edema, redness or tenderness in the calves or thighs and no ulcers, gangrene or trophic changes  Disposition: Home with follow up in 2 weeks   Follow-up Information    Follow up with [facility].   Why:  rolling walker and 3n1   Contact information:   [address] [place] [address] [phone]       Follow up with [facility].   Why:  home health physical therapy   Contact information:   [address] [place] [address] [phone]       Follow up with Mauri Pole, MD. Schedule an appointment as soon as possible for a visit  in 2 weeks.   Specialty:  Orthopedic Surgery   Contact information:   [address] [place] [address] [phone]       Discharge Instructions    Call MD / Call 911    Complete by:  As directed   If you experience chest pain or shortness of breath, CALL 911 and be transported to the hospital emergency room.  If you develope a fever above 101 F, pus (white drainage) or increased drainage or redness at the wound, or calf pain, call your surgeon's office.     Change dressing    Complete by:  As directed   Maintain surgical dressing until follow up in the clinic. If the edges start to pull up, may reinforce with tape. If the dressing is no longer working, may remove and cover with gauze and tape, but must keep the area dry and clean.  Call with any questions or concerns.     Constipation Prevention    Complete by:  As directed   Drink plenty of fluids.  Prune juice may be helpful.  You may use a stool softener, such as Colace (over the counter) 100 mg twice a day.  Use MiraLax (over the counter) for constipation as needed.     Diet - low sodium heart healthy    Complete by:  As directed      Discharge instructions    Complete by:  As directed   Maintain surgical dressing until follow up in the clinic. If the edges start to pull up, may reinforce with tape. If the dressing is no longer working, may remove and cover with gauze and tape, but must keep the area dry and clean.  Follow up in 2 weeks at [facility]. Call with any questions or concerns.     Driving restrictions    Complete by:  As directed   No driving for 4 weeks     Increase activity slowly as tolerated    Complete by:  As directed      TED hose    Complete by:  As directed   Use stockings (TED hose) for 2 weeks on both leg(s).  You may remove them at night for sleeping.     Weight bearing as tolerated    Complete by:  As directed   Laterality:  right  Extremity:  Lower               Medication List    STOP taking these medications        ibuprofen 600 MG tablet  Commonly known as:  ADVIL,MOTRIN      TAKE these medications        allopurinol 300 MG tablet  Commonly known as:  ZYLOPRIM  Take 300 mg by mouth daily.     ALPRAZolam 1 MG tablet  Commonly known as:  XANAX  Take 1 mg by mouth every 8 (eight) hours as needed for anxiety.     aspirin 325 MG EC tablet  Take  1 tablet (325 mg total) by mouth 2 (two) times daily.     atenolol 100 MG tablet  Commonly known as:  TENORMIN  Take 100 mg by mouth every morning.     calcium-vitamin D 500-200 MG-UNIT per tablet  Commonly known as:  OSCAL WITH D  Take 1 tablet by mouth daily with breakfast.     Colchicine 0.6 MG Caps  Take 1 tablet by mouth daily.     diazepam 5 MG tablet  Commonly known as:  VALIUM  Take 5 mg by mouth every 8 (eight) hours as needed for anxiety.     DSS 100 MG Caps  Take 100 mg by mouth 2 (two) times daily.     ferrous sulfate 325 (65 FE) MG tablet  Take 1 tablet (325 mg total) by mouth 3 (three) times daily after meals.     fish oil-omega-3 fatty acids 1000 MG capsule  Take 1 g by mouth 2 (two) times daily.     fluticasone 50 MCG/ACT nasal spray  Commonly known as:  FLONASE  Place 1 spray into both nostrils daily as needed. For congestion     gabapentin 100 MG capsule  Commonly known as:  NEURONTIN  Take 200 mg by mouth 3 (three) times daily.     GINSENG COMPLEX PO  Take 1 capsule by mouth daily.     lisinopril 5 MG tablet  Commonly known as:  PRINIVIL,ZESTRIL  Take 2.5 mg by mouth every morning.     multivitamin with minerals tablet  Take 1 tablet by mouth daily.     omeprazole 40 MG capsule  Commonly known as:  PRILOSEC  Take 40 mg by mouth daily.     OVER THE COUNTER MEDICATION  Take 1 each by mouth daily. Over the counter protein supplement     oxyCODONE 5 MG immediate release tablet  Commonly known as:  Oxy IR/ROXICODONE  Take 1-3 tablets (5-15 mg total)  by mouth every 4 (four) hours as needed for moderate pain or severe pain.     polyethylene glycol packet  Commonly known as:  MIRALAX / GLYCOLAX  Take 17 g by mouth 2 (two) times daily.     PredniSONE 5 MG Kit  Take 1 kit (5 mg total) by mouth as directed.     sodium chloride 0.65 % Soln nasal spray  Commonly known as:  OCEAN  Place 1 spray into both nostrils as needed for congestion.     tamsulosin 0.4 MG Caps capsule  Commonly known as:  FLOMAX  Take 1 capsule (0.4 mg total) by mouth daily after breakfast.     testosterone cypionate 200 MG/ML injection  Commonly known as:  DEPOTESTOTERONE CYPIONATE  Inject 200 mg into the muscle every 14 (fourteen) days.     tiZANidine 4 MG tablet  Commonly known as:  ZANAFLEX  Take 1 tablet (4 mg total) by mouth every 6 (six) hours as needed for muscle spasms.     UNABLE TO FIND  Take 1 tablet by mouth 2 (two) times daily. instaflex tablet for muscle pain, daily     venlafaxine XR 150 MG 24 hr capsule  Commonly known as:  EFFEXOR-XR  Take 150 mg by mouth every morning.     VITAMIN E COMPLEX PO  Take 1 capsule by mouth daily.     zolpidem 12.5 MG CR tablet  Commonly known as:  AMBIEN CR  Take 12.5 mg by mouth at bedtime as needed for sleep.  Signed: [place]. Sasha Rogel   PA-C  02/28/2014, 9:06 AM

## 2015-03-05 ENCOUNTER — Other Ambulatory Visit: Payer: Self-pay | Admitting: Neurosurgery

## 2015-03-05 DIAGNOSIS — M5136 Other intervertebral disc degeneration, lumbar region: Secondary | ICD-10-CM

## 2015-03-16 ENCOUNTER — Ambulatory Visit
Admission: RE | Admit: 2015-03-16 | Discharge: 2015-03-16 | Disposition: A | Discharge status: Home / Self Care | Payer: 59 | Source: Ambulatory Visit | Attending: Neurosurgery | Admitting: Neurosurgery

## 2015-03-16 DIAGNOSIS — M5136 Other intervertebral disc degeneration, lumbar region: Secondary | ICD-10-CM

## 2015-03-16 MED ORDER — GADOBENATE DIMEGLUMINE 529 MG/ML IV SOLN
20.0000 mL | Freq: Once | INTRAVENOUS | Status: AC | PRN
  Administered 2015-03-16: 20 mL via INTRAVENOUS

## 2015-03-31 ENCOUNTER — Other Ambulatory Visit: Payer: Self-pay | Admitting: Neurosurgery

## 2015-03-31 DIAGNOSIS — M5136 Other intervertebral disc degeneration, lumbar region: Secondary | ICD-10-CM

## 2015-06-16 ENCOUNTER — Other Ambulatory Visit: Payer: Self-pay | Admitting: Neurosurgery

## 2015-06-16 DIAGNOSIS — M5416 Radiculopathy, lumbar region: Secondary | ICD-10-CM

## 2015-06-21 ENCOUNTER — Ambulatory Visit
Admission: RE | Admit: 2015-06-21 | Discharge: 2015-06-21 | Disposition: A | Discharge status: Home / Self Care | Payer: 59 | Source: Ambulatory Visit | Attending: Neurosurgery | Admitting: Neurosurgery

## 2015-06-21 DIAGNOSIS — M5416 Radiculopathy, lumbar region: Secondary | ICD-10-CM

## 2015-06-24 ENCOUNTER — Other Ambulatory Visit: Payer: Self-pay | Admitting: Neurosurgery

## 2015-07-13 NOTE — Pre-Procedure Instructions (Signed)
Shayla Donnel Schofield  07/13/2015      [id number] [place], [place] - [address] [facility] [address] [place] [address] [place] [address] Phone: [phone] Fax: [phone]  [id number] - [place], [place] [place] [address] Phone: [phone] Fax: [phone]    Your procedure is scheduled on Thursday, May 25th   Report to [facility] Admitting at 7:00 AM             (posted surgery time 8:00 am - 9:30 am)   Call this number if you have problems the morning of surgery:  [phone]   Remember:  Do not eat food or drink liquids after midnight Wednesday.   Take these medicines the morning of surgery with A SIP OF WATER : Xanax, Atenolol, Neurontin, Omeprazole, Oxycodone, Flomax, Effexor.  Please use the Flonase the morning of surgery.   Do not wear jewelry - no rings or watches.  Do not wear lotions or colognes.   You may NOT wear deodorant the day of surgery.              Men may shave face and neck.  Do not bring valuables to the hospital.  [facility] is not responsible for any belongings or valuables.  Contacts, dentures or bridgework may not be worn into surgery.  Leave your suitcase in the car.  After surgery it may be brought to your room. For patients admitted to the hospital, discharge time will be determined by your treatment team.  Name and phone number of your driver:     Please read over the following fact sheets that you were given. Pain Booklet

## 2015-07-14 ENCOUNTER — Encounter (HOSPITAL_COMMUNITY)
Admission: RE | Admit: 2015-07-14 | Discharge: 2015-07-14 | Disposition: A | Discharge status: Home / Self Care | Payer: 59 | Source: Ambulatory Visit | Attending: Neurosurgery | Admitting: Neurosurgery

## 2015-07-14 ENCOUNTER — Other Ambulatory Visit (HOSPITAL_COMMUNITY): Payer: Self-pay | Admitting: *Deleted

## 2015-07-14 DIAGNOSIS — Z01812 Encounter for preprocedural laboratory examination: Secondary | ICD-10-CM | POA: Insufficient documentation

## 2015-07-14 DIAGNOSIS — M5126 Other intervertebral disc displacement, lumbar region: Secondary | ICD-10-CM | POA: Diagnosis not present

## 2015-07-14 LAB — SURGICAL PCR SCREEN
MRSA, PCR: NEGATIVE
STAPHYLOCOCCUS AUREUS: NEGATIVE

## 2015-07-14 LAB — CBC WITH DIFFERENTIAL/PLATELET
BASOS ABS: 0.1 10*3/uL (ref 0.0–0.1)
Basophils Relative: 1 %
EOS ABS: 0.6 10*3/uL (ref 0.0–0.7)
Eosinophils Relative: 8 %
HCT: 49.6 % (ref 39.0–52.0)
HEMOGLOBIN: 16.4 g/dL (ref 13.0–17.0)
LYMPHS PCT: 29 %
Lymphs Abs: 2.2 10*3/uL (ref 0.7–4.0)
MCH: 30.8 pg (ref 26.0–34.0)
MCHC: 33.1 g/dL (ref 30.0–36.0)
MCV: 93.2 fL (ref 78.0–100.0)
Monocytes Absolute: 0.6 10*3/uL (ref 0.1–1.0)
Monocytes Relative: 8 %
NEUTROS PCT: 54 %
Neutro Abs: 4.1 10*3/uL (ref 1.7–7.7)
Platelets: 198 10*3/uL (ref 150–400)
RBC: 5.32 MIL/uL (ref 4.22–5.81)
RDW: 13.5 % (ref 11.5–15.5)
WBC: 7.6 10*3/uL (ref 4.0–10.5)

## 2015-07-14 LAB — BASIC METABOLIC PANEL
Anion gap: 12 (ref 5–15)
BUN: 22 mg/dL — ABNORMAL HIGH (ref 6–20)
CO2: 25 mmol/L (ref 22–32)
Calcium: 9.8 mg/dL (ref 8.9–10.3)
Chloride: 100 mmol/L — ABNORMAL LOW (ref 101–111)
Creatinine, Ser: 1.16 mg/dL (ref 0.61–1.24)
Glucose, Bld: 92 mg/dL (ref 65–99)
Potassium: 4.6 mmol/L (ref 3.5–5.1)
Sodium: 137 mmol/L (ref 135–145)

## 2015-07-14 NOTE — Progress Notes (Addendum)
Pt does not see a cardiologist and denies any cardiac history or testing.  PCP Dr Antony Contras  EKG to be done today.  Denies SOB, cough, cold, fever  States that his PCP obtained a urine culture last week.  They should find out results soon, but feel he probably does have a bladdar infection.  States had a sleep study done about 4 years ago but does not use a CPAP or have current problems as has had some nasal surgery and no longer an issue

## 2015-07-14 NOTE — Pre-Procedure Instructions (Signed)
    Victor [name]  07/14/2015      [id number] [place], [place] - [address] [facility] [address] [place] [address] Phone: [phone] Fax: [phone]  [place], [place] - [place] [id number] [place] [address] Phone: [phone] Fax: [phone]    Your procedure is scheduled on Thursday May 25th   Report to [facility] [facility] [employer] this number if you have problems the morning of surgery:  [phone]   Remember:  Do not eat food or drink liquids after midnight.  Take these medicines the morning of surgery with A SIP OF WATER: Xanax, atenolol, Neurontin, Omeprazole, Oxycodone, Flomax, Effexor.  Please use the Flonase the morning of surgery. Stop taking all vitamin and herbal supplements, along with all aspirin, aspirin containing products, Nsaids (such as ibuprofen, naproxen, aleve, advil, motrin).   Do not wear jewelry  Do not wear lotions, powders, or colognes.  You may not wear deodorant.  Do not shave 48 hours prior to surgery.  Men may shave face and neck.   Do not bring valuables to the hospital.  [facility] is not responsible for any belongings or valuables.  Contacts, dentures or bridgework may not be worn into surgery.  Leave your suitcase in the car.  After surgery it may be brought to your room.  Special instructions:  Shower with CHG the night before and morning of surgery as instructed  Please read over the following fact sheets that you were given. MRSA Information

## 2015-07-15 LAB — HEMOGLOBIN A1C
Hgb A1c MFr Bld: 5.4 % (ref 4.8–5.6)
MEAN PLASMA GLUCOSE: 108 mg/dL

## 2015-07-21 NOTE — Anesthesia Preprocedure Evaluation (Addendum)
Anesthesia Evaluation  Patient identified by MRN, date of birth, ID band Patient awake    Reviewed: Allergy & Precautions, NPO status , Patient's Chart, lab work & pertinent test results  Airway Mallampati: II  TM Distance: >3 FB Neck ROM: Full    Dental no notable dental hx. (+) Teeth Intact,    Pulmonary sleep apnea , former smoker,    Pulmonary exam normal breath sounds clear to auscultation       Cardiovascular hypertension, Pt. on medications Normal cardiovascular exam Rhythm:Regular Rate:Normal     Neuro/Psych negative neurological ROS  negative psych ROS   GI/Hepatic negative GI ROS, Neg liver ROS,   Endo/Other  diabetes, Type 2  Renal/GU negative Renal ROS  negative genitourinary   Musculoskeletal  (+) Fibromyalgia -  Abdominal (+) - obese,   Peds negative pediatric ROS (+)  Hematology negative hematology ROS (+)   Anesthesia Other Findings   Reproductive/Obstetrics negative OB ROS                           BP Readings from Last 3 Encounters:  07/14/15 125/84  02/26/14 127/79  02/17/14 144/96   Lab Results  Component Value Date   WBC 7.6 07/14/2015   HGB 16.4 07/14/2015   HCT 49.6 07/14/2015   MCV 93.2 07/14/2015   PLT 198 07/14/2015     Chemistry      Component Value Date/Time   NA 137 07/14/2015 0930   NA 138 06/17/2012 1349   K 4.6 07/14/2015 0930   K 4.2 06/17/2012 1349   CL 100* 07/14/2015 0930   CL 106 06/17/2012 1349   CO2 25 07/14/2015 0930   CO2 21* 06/17/2012 1349   BUN 22* 07/14/2015 0930   BUN 11.1 06/17/2012 1349   CREATININE 1.16 07/14/2015 0930   CREATININE 1.0 06/17/2012 1349      Component Value Date/Time   CALCIUM 9.8 07/14/2015 0930   CALCIUM 9.6 06/17/2012 1349   ALKPHOS 46 04/15/2013 1030   ALKPHOS 58 06/17/2012 1349   AST 71* 04/15/2013 1030   AST 57* 06/17/2012 1349   ALT 40 04/15/2013 1030   ALT 40 06/17/2012 1349   BILITOT 0.8  04/15/2013 1030   BILITOT 0.82 06/17/2012 1349     Lab Results  Component Value Date   HGBA1C 5.4 07/14/2015   EKG 02/17/14: Sinus bradycardia with 1st degree A-V block Otherwise normal ECG Confirmed by Rockey Situ MD, TIMOTHY  Anesthesia Physical Anesthesia Plan  ASA: III  Anesthesia Plan: General   Post-op Pain Management:    Induction: Intravenous  Airway Management Planned: Oral ETT  Additional Equipment:   Intra-op Plan:   Post-operative Plan: Extubation in OR  Informed Consent: I have reviewed the patients History and Physical, chart, labs and discussed the procedure including the risks, benefits and alternatives for the proposed anesthesia with the patient or authorized representative who has indicated his/her understanding and acceptance.   Dental advisory given  Plan Discussed with: CRNA  Anesthesia Plan Comments:                                          Anesthesia Quick Evaluation

## 2015-07-22 ENCOUNTER — Ambulatory Visit (HOSPITAL_COMMUNITY): Payer: 59

## 2015-07-22 ENCOUNTER — Ambulatory Visit (HOSPITAL_COMMUNITY): Payer: 59 | Admitting: Certified Registered Nurse Anesthetist

## 2015-07-22 ENCOUNTER — Encounter (HOSPITAL_COMMUNITY): Payer: Self-pay | Admitting: *Deleted

## 2015-07-22 ENCOUNTER — Ambulatory Visit (HOSPITAL_COMMUNITY)
Admission: RE | Admit: 2015-07-22 | Discharge: 2015-07-22 | Disposition: A | Discharge status: Home / Self Care | Payer: 59 | Source: Ambulatory Visit | Attending: Neurosurgery | Admitting: Neurosurgery

## 2015-07-22 ENCOUNTER — Encounter (HOSPITAL_COMMUNITY)
Admission: RE | Disposition: A | Discharge status: Home / Self Care | Payer: Self-pay | Source: Ambulatory Visit | Attending: Neurosurgery

## 2015-07-22 ENCOUNTER — Ambulatory Visit (HOSPITAL_COMMUNITY): Payer: 59 | Admitting: Emergency Medicine

## 2015-07-22 DIAGNOSIS — M797 Fibromyalgia: Secondary | ICD-10-CM | POA: Diagnosis not present

## 2015-07-22 DIAGNOSIS — M5106 Intervertebral disc disorders with myelopathy, lumbar region: Secondary | ICD-10-CM | POA: Insufficient documentation

## 2015-07-22 DIAGNOSIS — I10 Essential (primary) hypertension: Secondary | ICD-10-CM | POA: Insufficient documentation

## 2015-07-22 DIAGNOSIS — Z419 Encounter for procedure for purposes other than remedying health state, unspecified: Secondary | ICD-10-CM

## 2015-07-22 DIAGNOSIS — M5126 Other intervertebral disc displacement, lumbar region: Secondary | ICD-10-CM | POA: Diagnosis present

## 2015-07-22 DIAGNOSIS — E119 Type 2 diabetes mellitus without complications: Secondary | ICD-10-CM | POA: Insufficient documentation

## 2015-07-22 DIAGNOSIS — Z87891 Personal history of nicotine dependence: Secondary | ICD-10-CM | POA: Diagnosis not present

## 2015-07-22 DIAGNOSIS — G4730 Sleep apnea, unspecified: Secondary | ICD-10-CM | POA: Insufficient documentation

## 2015-07-22 HISTORY — PX: LUMBAR LAMINECTOMY/DECOMPRESSION MICRODISCECTOMY: SHX5026

## 2015-07-22 LAB — GLUCOSE, CAPILLARY
GLUCOSE-CAPILLARY: 89 mg/dL (ref 65–99)
Glucose-Capillary: 95 mg/dL (ref 65–99)

## 2015-07-22 SURGERY — LUMBAR LAMINECTOMY/DECOMPRESSION MICRODISCECTOMY 1 LEVEL
Anesthesia: General | Site: Spine Lumbar | Laterality: Right

## 2015-07-22 MED ORDER — DIAZEPAM 5 MG PO TABS
5.0000 mg | ORAL_TABLET | Freq: Three times a day (TID) | ORAL | Status: DC | PRN
  Administered 2015-07-22: 5 mg via ORAL

## 2015-07-22 MED ORDER — GABAPENTIN 300 MG PO CAPS
600.0000 mg | ORAL_CAPSULE | Freq: Three times a day (TID) | ORAL | Status: DC
  Administered 2015-07-22: 600 mg via ORAL
  Filled 2015-07-22: qty 2

## 2015-07-22 MED ORDER — OXYCODONE-ACETAMINOPHEN 5-325 MG PO TABS
1.0000 | ORAL_TABLET | ORAL | Status: DC | PRN
  Administered 2015-07-22: 2 via ORAL

## 2015-07-22 MED ORDER — VENLAFAXINE HCL ER 75 MG PO CP24: 150.0000 mg | ORAL_CAPSULE | Freq: Every morning | ORAL | Status: DC

## 2015-07-22 MED ORDER — MENTHOL 3 MG MT LOZG: 1.0000 | LOZENGE | OROMUCOSAL | Status: DC | PRN

## 2015-07-22 MED ORDER — METOCLOPRAMIDE HCL 5 MG/ML IJ SOLN: 10.0000 mg | Freq: Once | INTRAMUSCULAR | Status: DC | PRN

## 2015-07-22 MED ORDER — SODIUM CHLORIDE 0.9% FLUSH
3.0000 mL | Freq: Two times a day (BID) | INTRAVENOUS | Status: DC
Start: 2015-07-22 — End: 2015-07-22

## 2015-07-22 MED ORDER — OXYCODONE-ACETAMINOPHEN 5-325 MG PO TABS
ORAL_TABLET | ORAL | Status: AC
  Filled 2015-07-22: qty 1

## 2015-07-22 MED ORDER — FENTANYL CITRATE (PF) 100 MCG/2ML IJ SOLN
25.0000 ug | INTRAMUSCULAR | Status: DC | PRN
  Administered 2015-07-22 (×3): 50 ug via INTRAVENOUS

## 2015-07-22 MED ORDER — ADULT MULTIVITAMIN W/MINERALS CH
1.0000 | ORAL_TABLET | Freq: Every day | ORAL | Status: DC
  Administered 2015-07-22: 1 via ORAL
  Filled 2015-07-22: qty 1

## 2015-07-22 MED ORDER — LACTATED RINGERS IV SOLN
INTRAVENOUS | Status: DC
  Administered 2015-07-22: 08:00:00 via INTRAVENOUS

## 2015-07-22 MED ORDER — LACTATED RINGERS IV SOLN: INTRAVENOUS | Status: DC

## 2015-07-22 MED ORDER — HYDROCODONE-ACETAMINOPHEN 5-325 MG PO TABS
1.0000 | ORAL_TABLET | ORAL | Status: DC | PRN
  Administered 2015-07-22: 2 via ORAL
  Filled 2015-07-22: qty 2

## 2015-07-22 MED ORDER — SODIUM CHLORIDE 0.9 % IV SOLN: 250.0000 mL | INTRAVENOUS | Status: DC

## 2015-07-22 MED ORDER — ALLOPURINOL 300 MG PO TABS
300.0000 mg | ORAL_TABLET | Freq: Every day | ORAL | Status: DC
  Administered 2015-07-22: 300 mg via ORAL
  Filled 2015-07-22: qty 1

## 2015-07-22 MED ORDER — DIAZEPAM 5 MG PO TABS
ORAL_TABLET | ORAL | Status: AC
  Filled 2015-07-22: qty 1

## 2015-07-22 MED ORDER — FENTANYL CITRATE (PF) 100 MCG/2ML IJ SOLN
INTRAMUSCULAR | Status: AC
  Filled 2015-07-22: qty 2

## 2015-07-22 MED ORDER — OMEGA-3 FATTY ACIDS 1000 MG PO CAPS: 1.0000 g | ORAL_CAPSULE | Freq: Two times a day (BID) | ORAL | Status: DC

## 2015-07-22 MED ORDER — THROMBIN 5000 UNITS EX SOLR
CUTANEOUS | Status: DC | PRN
  Administered 2015-07-22 (×2): 5000 [IU] via TOPICAL

## 2015-07-22 MED ORDER — MEPERIDINE HCL 25 MG/ML IJ SOLN: 6.2500 mg | INTRAMUSCULAR | Status: DC | PRN

## 2015-07-22 MED ORDER — PROPOFOL 10 MG/ML IV BOLUS
INTRAVENOUS | Status: AC
  Filled 2015-07-22: qty 20

## 2015-07-22 MED ORDER — PHENOL 1.4 % MT LIQD: 1.0000 | OROMUCOSAL | Status: DC | PRN

## 2015-07-22 MED ORDER — ACETAMINOPHEN 325 MG PO TABS: 650.0000 mg | ORAL_TABLET | ORAL | Status: DC | PRN

## 2015-07-22 MED ORDER — CEFAZOLIN SODIUM-DEXTROSE 2-4 GM/100ML-% IV SOLN
2.0000 g | INTRAVENOUS | Status: DC
  Filled 2015-07-22: qty 100

## 2015-07-22 MED ORDER — CALCIUM CARBONATE-VITAMIN D 500-200 MG-UNIT PO TABS: 1.0000 | ORAL_TABLET | Freq: Every day | ORAL | Status: DC | PRN

## 2015-07-22 MED ORDER — DEXAMETHASONE SODIUM PHOSPHATE 10 MG/ML IJ SOLN
10.0000 mg | INTRAMUSCULAR | Status: DC
  Filled 2015-07-22: qty 1

## 2015-07-22 MED ORDER — HEMOSTATIC AGENTS (NO CHARGE) OPTIME
TOPICAL | Status: DC | PRN
  Administered 2015-07-22: 1 via TOPICAL

## 2015-07-22 MED ORDER — SUGAMMADEX SODIUM 200 MG/2ML IV SOLN
INTRAVENOUS | Status: AC
  Filled 2015-07-22: qty 2

## 2015-07-22 MED ORDER — ALPRAZOLAM 0.5 MG PO TABS: 1.0000 mg | ORAL_TABLET | Freq: Three times a day (TID) | ORAL | Status: DC | PRN

## 2015-07-22 MED ORDER — HYDROMORPHONE HCL 1 MG/ML IJ SOLN: 0.5000 mg | INTRAMUSCULAR | Status: DC | PRN

## 2015-07-22 MED ORDER — FENTANYL CITRATE (PF) 250 MCG/5ML IJ SOLN
INTRAMUSCULAR | Status: AC
  Filled 2015-07-22: qty 5

## 2015-07-22 MED ORDER — LISINOPRIL 2.5 MG PO TABS
2.5000 mg | ORAL_TABLET | Freq: Every morning | ORAL | Status: DC
  Administered 2015-07-22: 2.5 mg via ORAL
  Filled 2015-07-22: qty 1

## 2015-07-22 MED ORDER — OMEGA-3-ACID ETHYL ESTERS 1 G PO CAPS
1.0000 g | ORAL_CAPSULE | Freq: Every day | ORAL | Status: DC
  Administered 2015-07-22: 1 g via ORAL
  Filled 2015-07-22: qty 1

## 2015-07-22 MED ORDER — SULFAMETHOXAZOLE-TRIMETHOPRIM 800-160 MG PO TABS
1.0000 | ORAL_TABLET | Freq: Two times a day (BID) | ORAL | Status: DC
  Administered 2015-07-22: 1 via ORAL
  Filled 2015-07-22: qty 1

## 2015-07-22 MED ORDER — CEFAZOLIN SODIUM 1-5 GM-% IV SOLN: 1.0000 g | Freq: Three times a day (TID) | INTRAVENOUS | Status: DC

## 2015-07-22 MED ORDER — 0.9 % SODIUM CHLORIDE (POUR BTL) OPTIME
TOPICAL | Status: DC | PRN
  Administered 2015-07-22: 1000 mL

## 2015-07-22 MED ORDER — ONDANSETRON HCL 4 MG/2ML IJ SOLN: 4.0000 mg | INTRAMUSCULAR | Status: DC | PRN

## 2015-07-22 MED ORDER — KETOROLAC TROMETHAMINE 30 MG/ML IJ SOLN
INTRAMUSCULAR | Status: AC
  Filled 2015-07-22: qty 1

## 2015-07-22 MED ORDER — PANTOPRAZOLE SODIUM 40 MG PO TBEC: 40.0000 mg | DELAYED_RELEASE_TABLET | Freq: Every day | ORAL | Status: DC

## 2015-07-22 MED ORDER — ZOLPIDEM TARTRATE 5 MG PO TABS: 5.0000 mg | ORAL_TABLET | Freq: Every evening | ORAL | Status: DC | PRN

## 2015-07-22 MED ORDER — MIDAZOLAM HCL 2 MG/2ML IJ SOLN
INTRAMUSCULAR | Status: AC
  Filled 2015-07-22: qty 2

## 2015-07-22 MED ORDER — ATENOLOL 100 MG PO TABS: 100.0000 mg | ORAL_TABLET | Freq: Every morning | ORAL | Status: DC

## 2015-07-22 MED ORDER — BUPIVACAINE HCL (PF) 0.25 % IJ SOLN
INTRAMUSCULAR | Status: DC | PRN
  Administered 2015-07-22: 20 mL

## 2015-07-22 MED ORDER — ACETAMINOPHEN 650 MG RE SUPP: 650.0000 mg | RECTAL | Status: DC | PRN

## 2015-07-22 MED ORDER — SALINE SPRAY 0.65 % NA SOLN: 1.0000 | NASAL | Status: DC | PRN

## 2015-07-22 MED ORDER — SODIUM CHLORIDE 0.9% FLUSH: 3.0000 mL | INTRAVENOUS | Status: DC | PRN

## 2015-07-22 MED ORDER — KETOROLAC TROMETHAMINE 30 MG/ML IJ SOLN: 30.0000 mg | Freq: Four times a day (QID) | INTRAMUSCULAR | Status: DC

## 2015-07-22 MED ORDER — SODIUM CHLORIDE 0.9 % IR SOLN
Status: DC | PRN
  Administered 2015-07-22: 09:00:00

## 2015-07-22 SURGICAL SUPPLY — 49 items
APL SKNCLS STERI-STRIP NONHPOA (GAUZE/BANDAGES/DRESSINGS) ×1
BAG DECANTER FOR FLEXI CONT (MISCELLANEOUS) ×3 IMPLANT
BENZOIN TINCTURE PRP APPL 2/3 (GAUZE/BANDAGES/DRESSINGS) ×3 IMPLANT
BLADE CLIPPER SURG (BLADE) ×2 IMPLANT
BRUSH SCRUB EZ PLAIN DRY (MISCELLANEOUS) ×3 IMPLANT
BUR CUTTER 7.0 ROUND (BURR) ×3 IMPLANT
CANISTER SUCT 3000ML PPV (MISCELLANEOUS) ×3 IMPLANT
CLOSURE WOUND 1/2 X4 (GAUZE/BANDAGES/DRESSINGS) ×1
DECANTER SPIKE VIAL GLASS SM (MISCELLANEOUS) ×1 IMPLANT
DRAPE LAPAROTOMY 100X72X124 (DRAPES) ×3 IMPLANT
DRAPE MICROSCOPE LEICA (MISCELLANEOUS) ×3 IMPLANT
DRAPE POUCH INSTRU U-SHP 10X18 (DRAPES) ×3 IMPLANT
DRAPE PROXIMA HALF (DRAPES) ×2 IMPLANT
DRAPE SURG 17X23 STRL (DRAPES) ×6 IMPLANT
DRSG OPSITE POSTOP 4X6 (GAUZE/BANDAGES/DRESSINGS) ×2 IMPLANT
DURAPREP 26ML APPLICATOR (WOUND CARE) ×3 IMPLANT
ELECT REM PT RETURN 9FT ADLT (ELECTROSURGICAL) ×3
ELECTRODE REM PT RTRN 9FT ADLT (ELECTROSURGICAL) ×1 IMPLANT
GAUZE SPONGE 4X4 12PLY STRL (GAUZE/BANDAGES/DRESSINGS) ×3 IMPLANT
GAUZE SPONGE 4X4 16PLY XRAY LF (GAUZE/BANDAGES/DRESSINGS) IMPLANT
GLOVE BIO SURGEON STRL SZ 6.5 (GLOVE) ×1 IMPLANT
GLOVE BIO SURGEONS STRL SZ 6.5 (GLOVE) ×1
GLOVE BIOGEL PI IND STRL 7.0 (GLOVE) IMPLANT
GLOVE BIOGEL PI IND STRL 7.5 (GLOVE) IMPLANT
GLOVE BIOGEL PI INDICATOR 7.0 (GLOVE) ×2
GLOVE BIOGEL PI INDICATOR 7.5 (GLOVE) ×2
GLOVE ECLIPSE 9.0 STRL (GLOVE) ×3 IMPLANT
GOWN STRL REUS W/ TWL LRG LVL3 (GOWN DISPOSABLE) IMPLANT
GOWN STRL REUS W/ TWL XL LVL3 (GOWN DISPOSABLE) ×1 IMPLANT
GOWN STRL REUS W/TWL 2XL LVL3 (GOWN DISPOSABLE) IMPLANT
GOWN STRL REUS W/TWL LRG LVL3 (GOWN DISPOSABLE) ×3
GOWN STRL REUS W/TWL XL LVL3 (GOWN DISPOSABLE) ×3
KIT BASIN OR (CUSTOM PROCEDURE TRAY) ×3 IMPLANT
KIT ROOM TURNOVER OR (KITS) ×3 IMPLANT
LIQUID BAND (GAUZE/BANDAGES/DRESSINGS) ×3 IMPLANT
NDL SPNL 22GX3.5 QUINCKE BK (NEEDLE) ×1 IMPLANT
NEEDLE HYPO 22GX1.5 SAFETY (NEEDLE) ×3 IMPLANT
NEEDLE SPNL 22GX3.5 QUINCKE BK (NEEDLE) ×3 IMPLANT
NS IRRIG 1000ML POUR BTL (IV SOLUTION) ×3 IMPLANT
PACK LAMINECTOMY NEURO (CUSTOM PROCEDURE TRAY) ×3 IMPLANT
PAD ARMBOARD 7.5X6 YLW CONV (MISCELLANEOUS) ×9 IMPLANT
RUBBERBAND STERILE (MISCELLANEOUS) ×6 IMPLANT
SPONGE SURGIFOAM ABS GEL SZ50 (HEMOSTASIS) ×3 IMPLANT
STRIP CLOSURE SKIN 1/2X4 (GAUZE/BANDAGES/DRESSINGS) ×2 IMPLANT
SUT VIC AB 2-0 CT1 18 (SUTURE) ×3 IMPLANT
SUT VIC AB 3-0 SH 8-18 (SUTURE) ×3 IMPLANT
TOWEL OR 17X24 6PK STRL BLUE (TOWEL DISPOSABLE) ×3 IMPLANT
TOWEL OR 17X26 10 PK STRL BLUE (TOWEL DISPOSABLE) ×3 IMPLANT
WATER STERILE IRR 1000ML POUR (IV SOLUTION) ×3 IMPLANT

## 2015-07-22 NOTE — Progress Notes (Signed)
Pt doing well. Pt and wife given D/C instructions with verbal understanding. Pt's incision is covered with Honeycomb dressing and has minimal amount of drainage. Pt's IV was removed prior to D/C. Pt D/C'd home via wheelchair @ 1420 per MD order. Pt is stable @ D/C and has no other needs at this time. Holli Humbles, RN

## 2015-07-22 NOTE — Discharge Summary (Signed)
Physician Discharge Summary  Patient ID: Victor [name] MRN: [id number] DOB/AGE: Nov 23, 1955 60 y.o.  Admit date: 07/22/2015 Discharge date: 07/22/2015  Admission Diagnoses:  Discharge Diagnoses:  Active Problems:   Lumbar herniated disc   Discharged Condition: good  Hospital Course: The patient was admitted to the hospital where he underwent an Uncomplicated lumbar microdiscectomy.  Postoperatively he is doing well.  Back and leg pain much improved.  Ready for discharge home. Consults:   Significant Diagnostic Studies:   Treatments:   Discharge Exam: Blood pressure 124/79, pulse 72, temperature 97.7 F (36.5 C), temperature source Oral, resp. rate 16, height 5\' 10"  (1.778 m), weight 102.059 kg (225 lb), SpO2 99 %. Patient is awake and alert.  He is oriented And appropriate.motor and sensory function intact.  Wound clean and dry.  Chest and abdomen benign.  Disposition: 06-Home-Health Care Svc     Medication List    TAKE these medications        allopurinol 300 MG tablet  Commonly known as:  ZYLOPRIM  Take 300 mg by mouth daily.     ALPRAZolam 1 MG tablet  Commonly known as:  XANAX  Take 1 mg by mouth every 8 (eight) hours as needed for anxiety.     atenolol 100 MG tablet  Commonly known as:  TENORMIN  Take 100 mg by mouth every morning.     calcium-vitamin D 500-200 MG-UNIT tablet  Commonly known as:  OSCAL WITH D  Take 1 tablet by mouth daily as needed (for supplementation).     Colchicine 0.6 MG Caps  Take 0.6 mg by mouth daily.     diazepam 5 MG tablet  Commonly known as:  VALIUM  Take 5 mg by mouth every 8 (eight) hours as needed for muscle spasms.     fish oil-omega-3 fatty acids 1000 MG capsule  Take 1 g by mouth 2 (two) times daily.     fluticasone 50 MCG/ACT nasal spray  Commonly known as:  FLONASE  Place 1 spray into both nostrils daily as needed. For congestion     gabapentin 300 MG capsule  Commonly known as:  NEURONTIN  Take 600 mg by  mouth 3 (three) times daily.     GINSENG COMPLEX PO  Take 1 capsule by mouth daily. Reported on 07/13/2015     ibuprofen 200 MG tablet  Commonly known as:  ADVIL,MOTRIN  Take 400 mg by mouth 2 (two) times daily.     lisinopril 5 MG tablet  Commonly known as:  PRINIVIL,ZESTRIL  Take 2.5 mg by mouth every morning.     magnesium hydroxide 400 MG/5ML suspension  Commonly known as:  MILK OF MAGNESIA  Take 30 mLs by mouth daily as needed for mild constipation.     multivitamin with minerals tablet  Take 1 tablet by mouth daily.     NONFORMULARY OR COMPOUNDED ITEM  Apply 1 application topically daily. Doxepin 5%, Amantadine 3%, Pentoxifylline 3%, Lamotrigine 2%, Sertraline 3%, Gabapentin 6%     omeprazole 40 MG capsule  Commonly known as:  PRILOSEC  Take 40 mg by mouth daily.     OVER THE COUNTER MEDICATION  Take 1 each by mouth daily. Over the counter protein supplement     oxyCODONE-acetaminophen 5-325 MG tablet  Commonly known as:  PERCOCET/ROXICET  Take 1-2 tablets by mouth every 4 (four) hours as needed for moderate pain.     sodium chloride 0.65 % Soln nasal spray  Commonly known as:  Data processing manager  Place 1 spray into both nostrils as needed for congestion.     sulfamethoxazole-trimethoprim 800-160 MG tablet  Commonly known as:  BACTRIM DS,SEPTRA DS  Take 1 tablet by mouth 2 (two) times daily.     testosterone cypionate 200 MG/ML injection  Commonly known as:  DEPOTESTOSTERONE CYPIONATE  Inject 200 mg into the muscle every 14 (fourteen) days.     venlafaxine XR 150 MG 24 hr capsule  Commonly known as:  EFFEXOR-XR  Take 150 mg by mouth every morning.     VITAMIN E COMPLEX PO  Take 1 capsule by mouth daily.     zolpidem 12.5 MG CR tablet  Commonly known as:  AMBIEN CR  Take 12.5 mg by mouth at bedtime as needed for sleep.           Follow-up Information    Follow up with Charlie Pitter, MD.   Specialty:  Neurosurgery   Contact information:   [address] [place] [address] [phone]       Signed: Charlie Pitter 07/22/2015, 1:39 PM

## 2015-07-22 NOTE — Discharge Instructions (Signed)

## 2015-07-22 NOTE — Op Note (Signed)
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  Surgeon:Perle Gibbon A.Kaizlee Carlino, M.D.  Asst. Surgeon: None  Anesthesia: General  Indication: 60 year old male status post previous L3-4 decompression and fusion many years ago. Patient presents now with back and right lower extremity pain. Workup demonstrates evidence of a large right sided L2-L3 disc herniation with moderately severe stenosis and severe right L3 nerve root compression. Patient presents now for microdiscectomy. Patient is aware of high risk of recurrent disc herniation.  Operative note: After induction of anesthesia, patient positioned prone on the Wilson frame and appropriately padded. Lumbar region prepped and draped sterilely. Incision made overlying L2-3. Dissection performed on the right. Retractor placed. X-ray taken. Level confirmed. Laminotomy performed using high-speed drill and Kerrison rongeurs to remove the inferior aspect the lamina of L2 medial aspect the L2-3 facet joint and superior rim of the L3 lamina appear well-inflated elevated and resected with Kerrison rongeurs. Underlying thecal sac and L3 nerve roots identified. Microscope brought into the field and using microdissection spinal canal. Epidural venous plexus was coagulated and cut. Thecal sac and L3 nerve root were gently mobilized medially. Disc herniation on the right side was dissected free and removed and a number of very large pieces. All elements of the inferior fragment appeared to be removed. Using nerve hooks beneath the thecal sac I teased out a large central disc herniation as well. There was no evidence of any residual compression upon the thecal sac or nerve roots. At this point a very thorough discectomy been achieved. I do not see value in entering the  disc space proper and further disrupting this. The wound was then irrigated with MI solution. Gelfoam was placed topically for hemostasis. Wound is then closed in layers with Vicryl sutures. Steri-Strips and sterile dressing were applied. There were no apparent complications. Patient tolerated the procedure well and he returns to the recovery room postoperatively

## 2015-07-22 NOTE — Brief Op Note (Signed)
07/22/2015  9:28 AM  PATIENT:  Victor [name]  60 y.o. male  PRE-OPERATIVE DIAGNOSIS:  HNP  POST-OPERATIVE DIAGNOSIS:  HNP  PROCEDURE:  Procedure(s): Right Lumbar Two-Three Microdiscectomy (Right)  SURGEON:  Surgeon(s) and Role:    * Earnie Larsson, MD - Primary  PHYSICIAN ASSISTANT:   ASSISTANTS:    ANESTHESIA:   general  EBL:     BLOOD ADMINISTERED:none  DRAINS: none   LOCAL MEDICATIONS USED:  MARCAINE     SPECIMEN:  No Specimen  DISPOSITION OF SPECIMEN:  N/A  COUNTS:  YES  TOURNIQUET:  * No tourniquets in log *  DICTATION: .Dragon Dictation  PLAN OF CARE: Admit for overnight observation  PATIENT DISPOSITION:  PACU - hemodynamically stable.   Delay start of Pharmacological VTE agent (>24hrs) due to surgical blood loss or risk of bleeding: yes

## 2015-07-22 NOTE — Anesthesia Postprocedure Evaluation (Signed)
Anesthesia Post Note  Patient: Victor [name]  Procedure(s) Performed: Procedure(s) (LRB): Right Lumbar Two-Three Microdiscectomy (Right)  Patient location during evaluation: PACU Anesthesia Type: General Level of consciousness: awake and alert Pain management: pain level controlled Vital Signs Assessment: post-procedure vital signs reviewed and stable Respiratory status: spontaneous breathing, nonlabored ventilation, respiratory function stable and patient connected to nasal cannula oxygen Cardiovascular status: blood pressure returned to baseline and stable Postop Assessment: no signs of nausea or vomiting Anesthetic complications: no    Last Vitals:  Filed Vitals:   07/22/15 1030 07/22/15 1125  BP: 142/90 159/80  Pulse: 68 67  Temp:    Resp: 18 16    Last Pain:  Filed Vitals:   07/22/15 1126  PainSc: 7       LLE Sensation: Full sensation;No numbness;No pain;No tingling (07/22/15 1125)   RLE Sensation: Numbness;Pain;Tingling (pre op as well) (07/22/15 1125)      Montez Hageman

## 2015-07-22 NOTE — H&P (Signed)
Victor [name] is an 60 y.o. male.   Chief Complaint: Back pain HPI: 60 year old male status post previous L3-4 decompression and fusion presents with severe back pain with radiation into his right lower extremity. Workup demonstrates evidence for a large right-sided L2-3 disc herniation. Patient is anxious to avoid further fusion surgery and presents now for microdiscectomy in hopes of improving his symptoms.  Past Medical History  Diagnosis Date  . Anxiety   . Hypertension   . GERD (gastroesophageal reflux disease)   . Fibromyalgia   . High cholesterol   . Sleep apnea     "don't use CPAP" (04/15/2013)  . Diabetes mellitus ([place])     "diet controlled" (04/15/2013)  . Arthritis     "all over" (04/15/2013)  . Gout   . Back pain, chronic   . Numbness of feet   . DDD (degenerative disc disease), cervical   . DDD (degenerative disc disease), lumbar     Past Surgical History  Procedure Laterality Date  . Posterior lumbar fusion  11/1995    "ray cages" (04/15/2013)  . Tonsillectomy  1960's  . Appendectomy  ~ 2010  . Shoulder arthroscopy Bilateral     "twice each"  . Knee arthroscopy Right 1990's  . Carpal tunnel release Left 1990's  . Hand tendon surgery Left ~ 1978 X2-1980's X3    "had 5 ORs after laceration"  . Polypectomy  01/10/2011    Procedure: POLYPECTOMY;  Surgeon: Lear Ng, MD;  Location: [facility];  Service: Endoscopy;  Laterality: N/A;  . Flexible sigmoidoscopy  12/21/2011    Procedure: FLEXIBLE SIGMOIDOSCOPY;  Surgeon: Lear Ng, MD;  Location: [facility];  Service: Endoscopy;  Laterality: N/A;  . Hot hemostasis  12/21/2011    Procedure: HOT HEMOSTASIS (ARGON PLASMA COAGULATION/BICAP);  Surgeon: Lear Ng, MD;  Location: [facility];  Service: Endoscopy;  Laterality: N/A;  . Vasectomy  1988  . Inguinal hernia repair Bilateral 1970's - 1984  . Inguinal hernia repair Left 04/15/2013  . Wisdom tooth extraction  2000's  . Anterior  cervical decomp/discectomy fusion  2000's X3    "had 3 surgeries on my front neck" (04/15/2013)  . Nasal septum surgery Bilateral 2014    "removed polyps & infection"  . Laparoscopy N/A 04/15/2013    Procedure: LAPAROSCOPY DIAGNOSTIC;  Surgeon: Gayland Curry, MD;  Location: [place];  Service: General;  Laterality: N/A;  . Inguinal hernia repair Left 04/15/2013    Procedure: LAPAROSCOPIC REPAIR OF INCARCERATED LEFT INGUINAL HERNIA;  Surgeon: Gayland Curry, MD;  Location: [place];  Service: General;  Laterality: Left;  . Insertion of mesh Left 04/15/2013    Procedure: INSERTION OF MESH;  Surgeon: Gayland Curry, MD;  Location: [place];  Service: General;  Laterality: Left;  . Total knee arthroplasty Right 02/24/2014    Procedure: TOTAL RIGHT KNEE ARTHROPLASTY;  Surgeon: Mauri Pole, MD;  Location: [facility];  Service: Orthopedics;  Laterality: Right;    Family History  Problem Relation Age of Onset  . Hypertension Father    Social History:  reports that he quit smoking about 5 years ago. His smoking use included Cigarettes and Cigars. He has a 1.5 pack-year smoking history. He has never used smokeless tobacco. He reports that he drinks about 3.0 oz of alcohol per week. He reports that he does not use illicit drugs.  Allergies:  Allergies  Allergen Reactions  . Flexeril [Cyclobenzaprine] Other (See Comments)    Makes patient irritable  .  Tetracyclines & Related Other (See Comments)    " makes me angry"    Medications Prior to Admission  Medication Sig Dispense Refill  . allopurinol (ZYLOPRIM) 300 MG tablet Take 300 mg by mouth daily.     Marland Kitchen ALPRAZolam (XANAX) 1 MG tablet Take 1 mg by mouth every 8 (eight) hours as needed for anxiety.     Marland Kitchen atenolol (TENORMIN) 100 MG tablet Take 100 mg by mouth every morning.     . Colchicine 0.6 MG CAPS Take 0.6 mg by mouth daily.     . diazepam (VALIUM) 5 MG tablet Take 5 mg by mouth every 8 (eight) hours as needed for muscle spasms.     . fish oil-omega-3  fatty acids 1000 MG capsule Take 1 g by mouth 2 (two) times daily.     . fluticasone (FLONASE) 50 MCG/ACT nasal spray Place 1 spray into both nostrils daily as needed. For congestion    . gabapentin (NEURONTIN) 300 MG capsule Take 600 mg by mouth 3 (three) times daily.  1  . ibuprofen (ADVIL,MOTRIN) 200 MG tablet Take 400 mg by mouth 2 (two) times daily.    Marland Kitchen lisinopril (PRINIVIL,ZESTRIL) 5 MG tablet Take 2.5 mg by mouth every morning.     . magnesium hydroxide (MILK OF MAGNESIA) 400 MG/5ML suspension Take 30 mLs by mouth daily as needed for mild constipation.    . Multiple Vitamins-Minerals (MULTIVITAMIN WITH MINERALS) tablet Take 1 tablet by mouth daily.      . NONFORMULARY OR COMPOUNDED ITEM Apply 1 application topically daily. Doxepin 5%, Amantadine 3%, Pentoxifylline 3%, Lamotrigine 2%, Sertraline 3%, Gabapentin 6%    . omeprazole (PRILOSEC) 40 MG capsule Take 40 mg by mouth daily.    Marland Kitchen oxyCODONE-acetaminophen (PERCOCET/ROXICET) 5-325 MG tablet Take 1-2 tablets by mouth every 4 (four) hours as needed for moderate pain.   0  . sulfamethoxazole-trimethoprim (BACTRIM DS,SEPTRA DS) 800-160 MG tablet Take 1 tablet by mouth 2 (two) times daily.    Marland Kitchen testosterone cypionate (DEPOTESTOTERONE CYPIONATE) 200 MG/ML injection Inject 200 mg into the muscle every 14 (fourteen) days.     Marland Kitchen venlafaxine (EFFEXOR-XR) 150 MG 24 hr capsule Take 150 mg by mouth every morning.     Marland Kitchen VITAMIN E COMPLEX PO Take 1 capsule by mouth daily.     Marland Kitchen zolpidem (AMBIEN CR) 12.5 MG CR tablet Take 12.5 mg by mouth at bedtime as needed for sleep.     . calcium-vitamin D (OSCAL WITH D) 500-200 MG-UNIT per tablet Take 1 tablet by mouth daily as needed (for supplementation).     . Misc Natural Products (GINSENG COMPLEX PO) Take 1 capsule by mouth daily. Reported on 07/13/2015    . OVER THE COUNTER MEDICATION Take 1 each by mouth daily. Over the counter protein supplement    . sodium chloride (OCEAN) 0.65 % SOLN nasal spray Place 1  spray into both nostrils as needed for congestion.      Results for orders placed or performed during the hospital encounter of 07/22/15 (from the past 48 hour(s))  Glucose, capillary     Status: None   Collection Time: 07/22/15  7:19 AM  Result Value Ref Range   Glucose-Capillary 89 65 - 99 mg/dL   No results found.  A comprehensive review of systems was negative.  Blood pressure 131/80, pulse 58, temperature 98.6 F (37 C), temperature source Oral, resp. rate 20, height 5\' 10"  (1.778 m), weight 102.059 kg (225 lb), SpO2 95 %.  Patient  is awake and alert. He is oriented and appropriate. He subsequently quite uncomfortable. Examination of his head ears eyes nose and throat unremarkable. Chest and abdomen are benign. Extremities are free from injury deformity. Neurologically cranial nerve function is intact. Motor examination reveals mild quadriceps weakness on the right otherwise motor strength intact. Sensory examination reveals decreased sensation to light touch in his right L3 and L4 dermatomes. Deep tendon orifices normal active sepsis Achilles reflexes are absent bilaterally and his right-sided patellar reflexes depressed. Straight leg raising positive on the right negative on the left. Assessment/Plan Right L2-3 herniated nucleus pulposis with recall therapy plan right L2-3 laminotomy and microdiscectomy. Risks and benefits of that explained. Patient wishes to proceed.  Sherin Murdoch A 07/22/2015, 7:47 AM

## 2015-07-23 ENCOUNTER — Encounter (HOSPITAL_COMMUNITY): Payer: Self-pay | Admitting: Neurosurgery

## 2015-10-22 ENCOUNTER — Other Ambulatory Visit: Payer: Self-pay | Admitting: Neurosurgery

## 2015-10-22 DIAGNOSIS — M5416 Radiculopathy, lumbar region: Secondary | ICD-10-CM

## 2015-11-03 ENCOUNTER — Ambulatory Visit
Admission: RE | Admit: 2015-11-03 | Discharge: 2015-11-03 | Disposition: A | Discharge status: Home / Self Care | Payer: 59 | Source: Ambulatory Visit | Attending: Neurosurgery | Admitting: Neurosurgery

## 2015-11-03 DIAGNOSIS — M5416 Radiculopathy, lumbar region: Secondary | ICD-10-CM

## 2015-11-03 MED ORDER — GADOBENATE DIMEGLUMINE 529 MG/ML IV SOLN
20.0000 mL | Freq: Once | INTRAVENOUS | Status: AC | PRN
  Administered 2015-11-03: 20 mL via INTRAVENOUS

## 2015-12-27 IMAGING — CR DG CHEST 2V
2 series · 2 of 2 positions shown · non-contrast
Comparison: Chest radiograph 03/13/2012

CLINICAL DATA: Hypertension.  Preop.

EXAM:
CHEST  2 VIEW

[w chest pa]
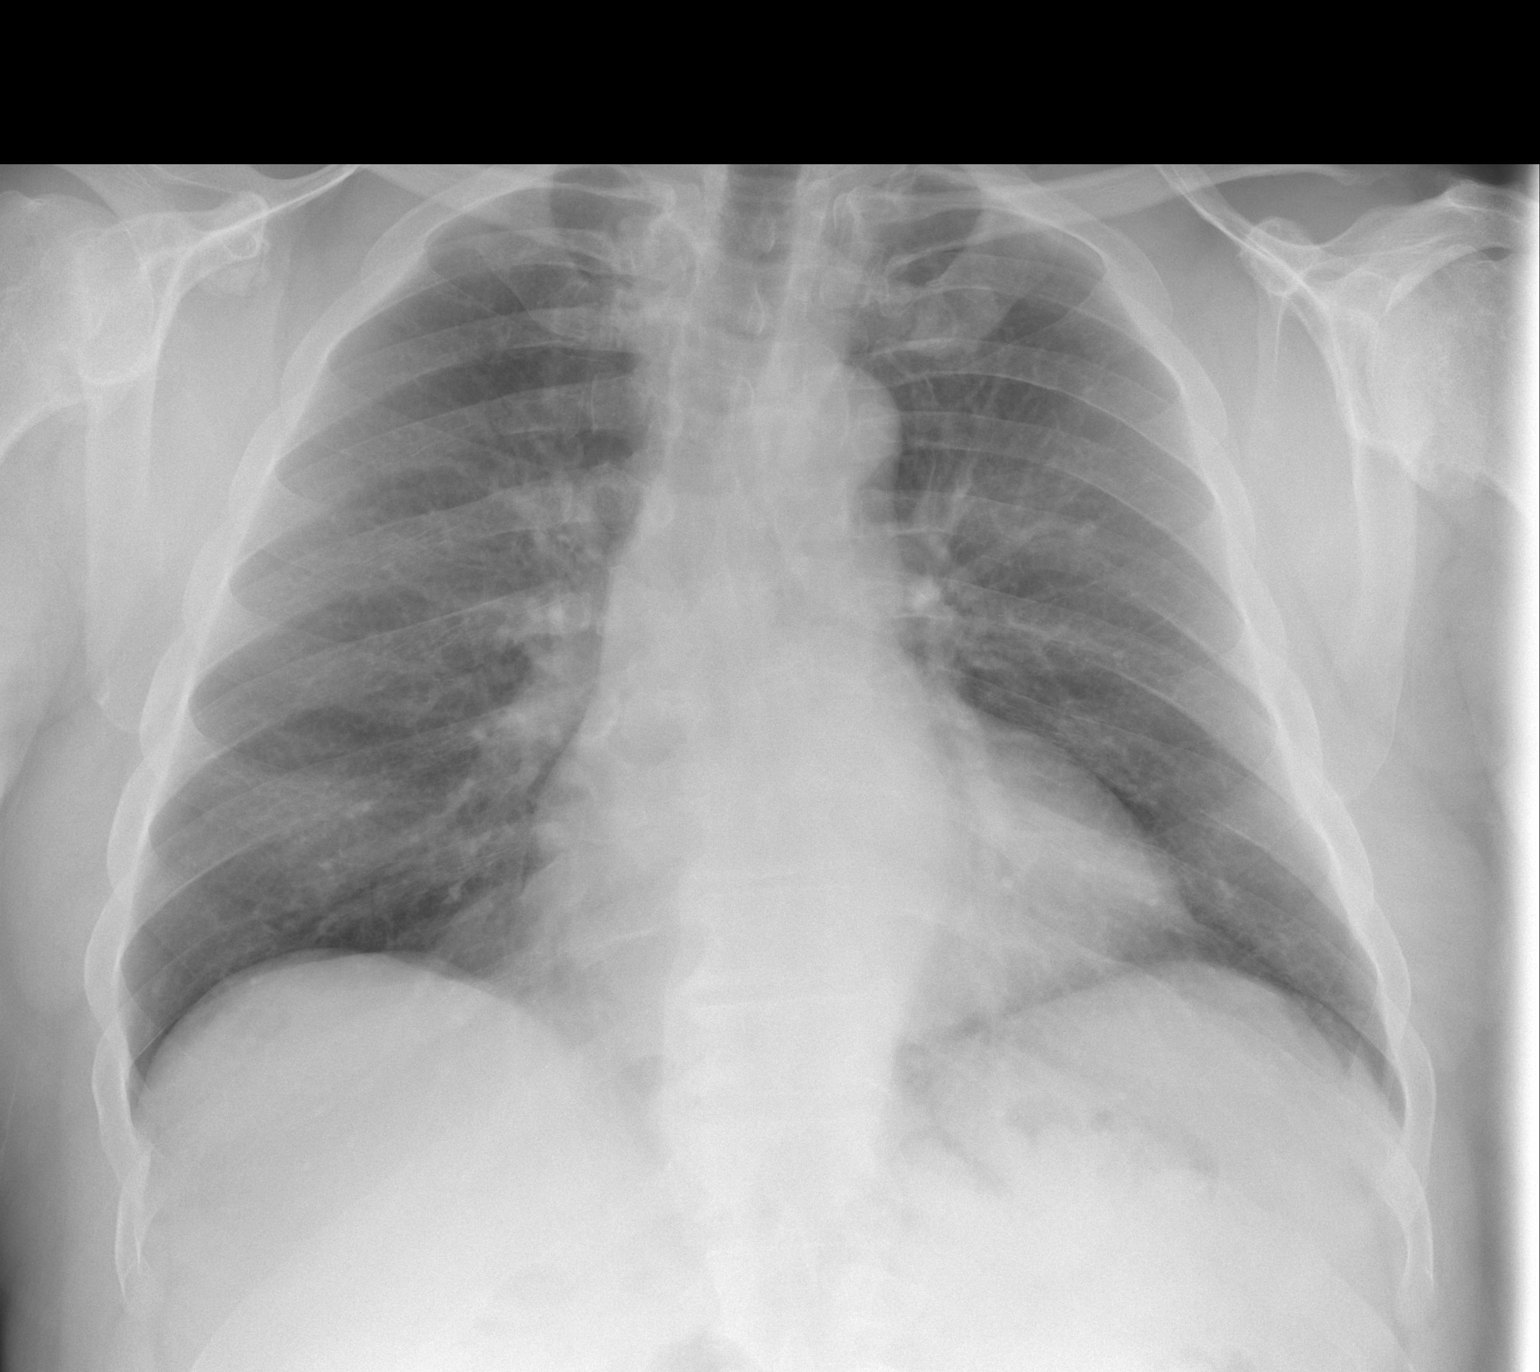

[w chest lat]
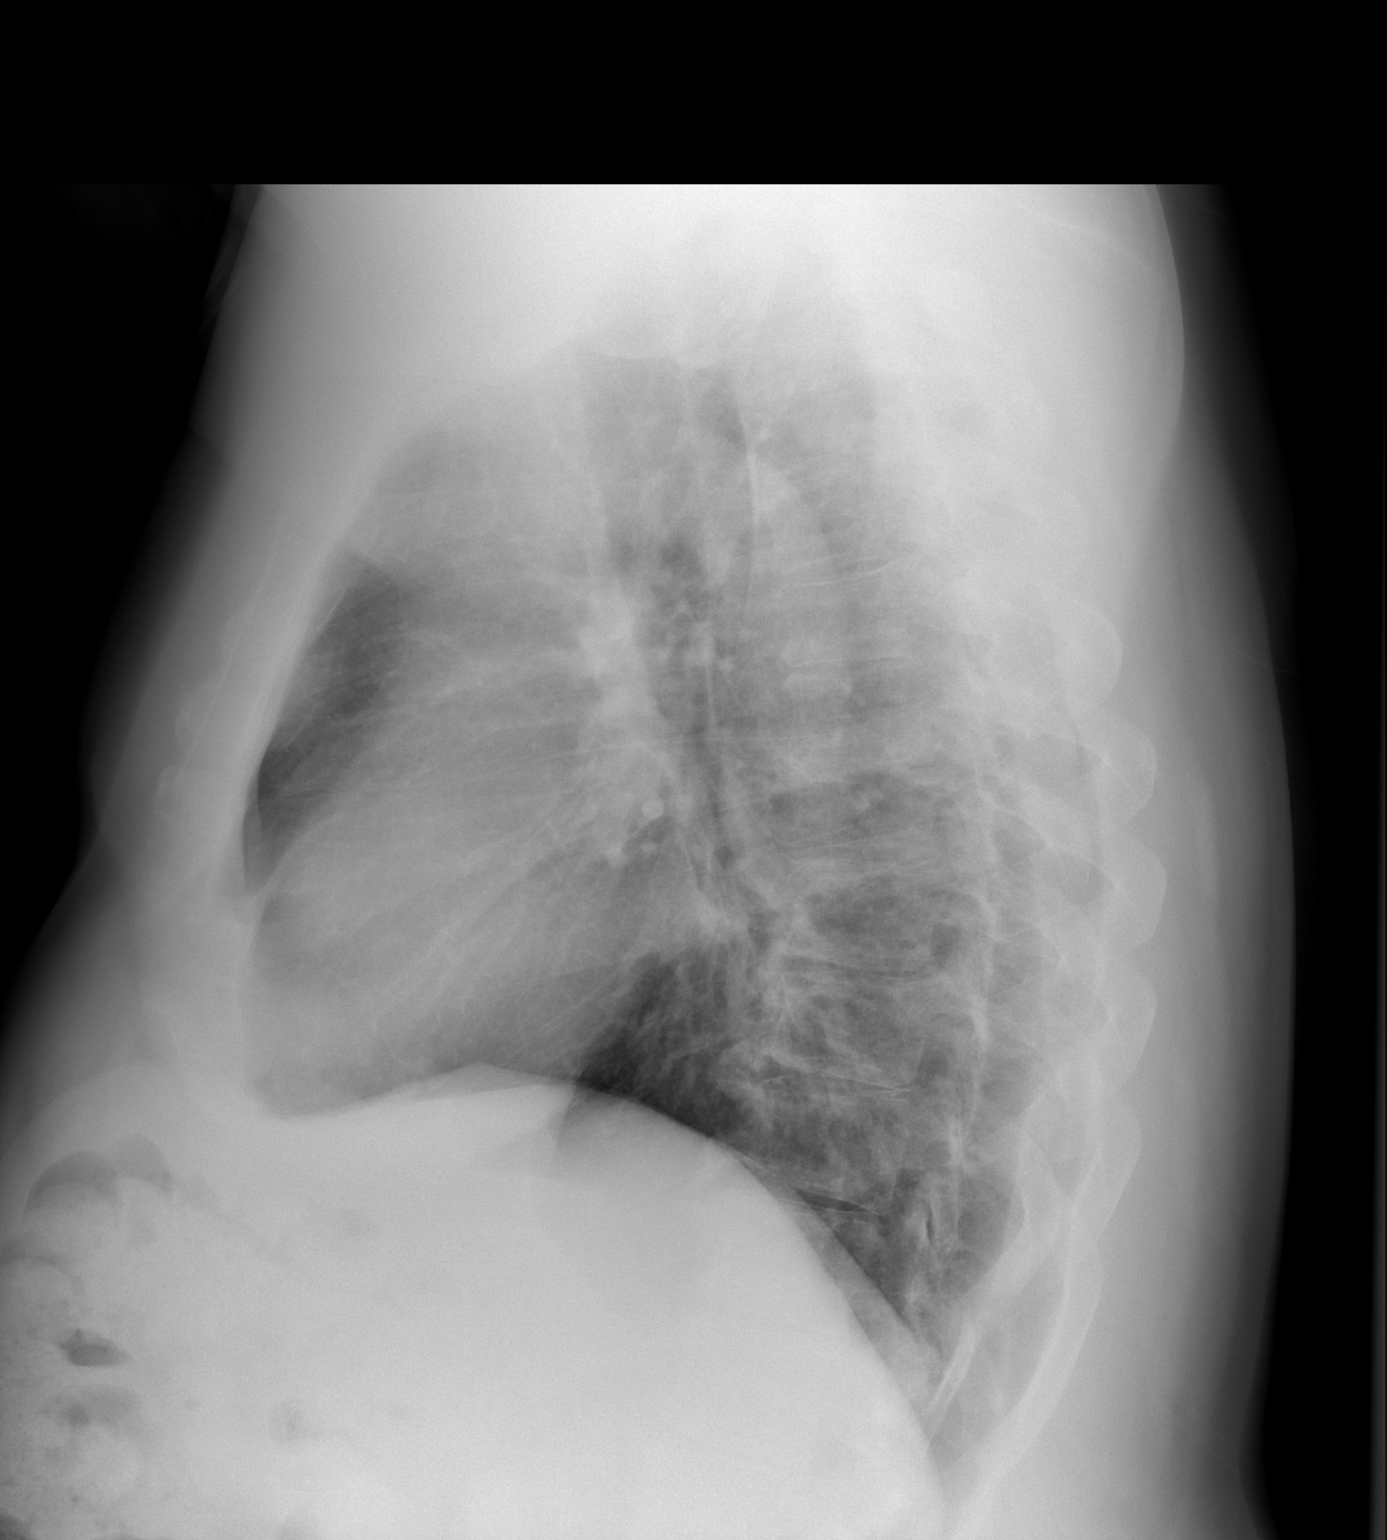

[2 of 2 positions shown; findings below may reference images not displayed]

FINDINGS: The cardiomediastinal contours are unchanged, heart at the upper
limits of normal in size. Pulmonary vasculature is normal. No
confluent airspace disease. No pleural effusion or pneumothorax.
There is stable degenerative change throughout the spine.
IMPRESSION: No acute pulmonary process.

## 2016-01-02 ENCOUNTER — Other Ambulatory Visit: Payer: Self-pay | Admitting: Rheumatology

## 2016-01-03 ENCOUNTER — Telehealth: Payer: Self-pay | Admitting: Radiology

## 2016-01-03 ENCOUNTER — Other Ambulatory Visit: Payer: Self-pay | Admitting: Rheumatology

## 2016-01-03 NOTE — Telephone Encounter (Signed)
Patient needs follow up appt with Dr Deveshwar pls call to make appt. Feb 

## 2016-01-03 NOTE — Telephone Encounter (Signed)
Last visit 10/15/15 Next visit not scheduled/ have sent message for scheduling Ok to refill Ambien CR ?

## 2016-01-03 NOTE — Telephone Encounter (Signed)
Ok to refill Ambien CR

## 2016-01-04 NOTE — Telephone Encounter (Signed)
Patient will call back to schedule after he checks calendar.

## 2016-01-05 ENCOUNTER — Telehealth: Payer: Self-pay | Admitting: Rheumatology

## 2016-01-05 ENCOUNTER — Other Ambulatory Visit: Payer: Self-pay | Admitting: Rheumatology

## 2016-01-11 ENCOUNTER — Encounter: Payer: Self-pay | Admitting: Rheumatology

## 2016-02-20 ENCOUNTER — Other Ambulatory Visit: Payer: Self-pay | Admitting: Rheumatology

## 2016-02-23 ENCOUNTER — Telehealth: Payer: Self-pay | Admitting: Rheumatology

## 2016-02-23 NOTE — Telephone Encounter (Signed)
Message sent for appt scheduling I have advised patient previously he is past due for labs he sent in a uric acid level Last refill you told me in a message he showed you lab results in his phone but this is not documented in the record   Do you wan to refill ?

## 2016-02-23 NOTE — Telephone Encounter (Signed)
-----   Message from Candice Camp, RT sent at 02/23/2016 10:31 AM EST ----- Needs appt 6 m from previous

## 2016-02-23 NOTE — Telephone Encounter (Signed)
LMOM for patient to call back to schedule appointment.  

## 2016-03-23 ENCOUNTER — Other Ambulatory Visit: Payer: Self-pay | Admitting: Rheumatology

## 2016-04-18 DIAGNOSIS — M47816 Spondylosis without myelopathy or radiculopathy, lumbar region: Secondary | ICD-10-CM | POA: Insufficient documentation

## 2016-04-18 DIAGNOSIS — M1A09X0 Idiopathic chronic gout, multiple sites, without tophus (tophi): Secondary | ICD-10-CM | POA: Insufficient documentation

## 2016-04-18 DIAGNOSIS — M797 Fibromyalgia: Secondary | ICD-10-CM | POA: Insufficient documentation

## 2016-04-18 DIAGNOSIS — M170 Bilateral primary osteoarthritis of knee: Secondary | ICD-10-CM | POA: Insufficient documentation

## 2016-04-18 NOTE — Progress Notes (Signed)
Office Visit Note  Patient: Victor [name]             Date of Birth: [birth year]           MRN: [id number]             PCP: Gara Kroner, MD Referring: Antony Contras, MD Visit Date: 04/27/2016 Occupation: @GUAROCC @    Subjective:  Joint pain , lower back pain   History of Present Illness: Victor [name] is a 61 y.o. male with history of fibromyalgia osteoarthritis and gout and this disease. He states he did initially quite well after the discectomy but now the pain has returned and the pain has been radiating down bilateral lower extremities.. He has not had any gout flare. His been taking allopurinol 450 mg a day and colchicine one tablet a day. His right total knee replacement is doing well.  Activities of Daily Living:  Patient reports morning stiffness for 1 hour.   Patient Reports nocturnal pain.  Difficulty dressing/grooming: Denies Difficulty climbing stairs: Reports Difficulty getting out of chair: Reports Difficulty using hands for taps, buttons, cutlery, and/or writing: Denies   Review of Systems  Constitutional: Positive for fever. Negative for fatigue, night sweats and weakness ( ).  HENT: Negative for mouth sores, mouth dryness and nose dryness.   Eyes: Negative for redness and dryness.  Respiratory: Negative for shortness of breath and difficulty breathing.   Cardiovascular: Negative for chest pain, palpitations, hypertension, irregular heartbeat and swelling in legs/feet.  Gastrointestinal: Negative for constipation and diarrhea.  Endocrine: Negative for increased urination.  Musculoskeletal: Positive for morning stiffness. Negative for arthralgias, joint pain, joint swelling, myalgias, muscle weakness, muscle tenderness and myalgias.  Skin: Negative for color change, rash, hair loss, nodules/bumps, skin tightness, ulcers and sensitivity to sunlight.  Allergic/Immunologic: Negative for susceptible to infections.  Neurological: Negative for dizziness,  fainting, memory loss and night sweats.  Hematological: Negative for swollen glands.  Psychiatric/Behavioral: Positive for depressed mood and sleep disturbance. The patient is nervous/anxious.     PMFS History:  Patient Active Problem List   Diagnosis Date Noted  . Chronic kidney disease (CKD) 04/26/2016  . DJD (degenerative joint disease), cervical 04/26/2016  . Essential hypertension 04/26/2016  . Dyslipidemia 04/26/2016  . Sleep apnea 04/26/2016  . Fibromyalgia 04/18/2016  . Idiopathic chronic gout of multiple sites without tophus 04/18/2016  . Spondylosis of lumbar region without myelopathy or radiculopathy 04/18/2016  . Primary osteoarthritis of both knees 04/18/2016  . Lumbar herniated disc 07/22/2015  . Morbid obesity ([place]) 02/25/2014  . S/P right TKA 02/24/2014  . Heartburn 01/10/2011    Past Medical History:  Diagnosis Date  . Anxiety   . Arthritis    "all over" (04/15/2013)  . Back pain, chronic   . DDD (degenerative disc disease), cervical   . DDD (degenerative disc disease), lumbar   . Diabetes mellitus ([place])    "diet controlled" (04/15/2013)  . Fibromyalgia   . GERD (gastroesophageal reflux disease)   . Gout   . High cholesterol   . Hypertension   . Numbness of feet   . Sleep apnea    "don't use CPAP" (04/15/2013)    Family History  Problem Relation Age of Onset  . Hypertension Father    Past Surgical History:  Procedure Laterality Date  . ANTERIOR CERVICAL DECOMP/DISCECTOMY FUSION  2000's X3   "had 3 surgeries on my front neck" (04/15/2013)  . APPENDECTOMY  ~ 2010  . CARPAL TUNNEL RELEASE Left 1990's  .  FLEXIBLE SIGMOIDOSCOPY  12/21/2011   Procedure: FLEXIBLE SIGMOIDOSCOPY;  Surgeon: Lear Ng, MD;  Location: [facility];  Service: Endoscopy;  Laterality: N/A;  . HAND TENDON SURGERY Left ~ 1978 X2-1980's X3   "had 5 ORs after laceration"  . HOT HEMOSTASIS  12/21/2011   Procedure: HOT HEMOSTASIS (ARGON PLASMA COAGULATION/BICAP);  Surgeon:  Lear Ng, MD;  Location: [facility];  Service: Endoscopy;  Laterality: N/A;  . INGUINAL HERNIA REPAIR Bilateral 1970's - 1984  . INGUINAL HERNIA REPAIR Left 04/15/2013  . INGUINAL HERNIA REPAIR Left 04/15/2013   Procedure: LAPAROSCOPIC REPAIR OF INCARCERATED LEFT INGUINAL HERNIA;  Surgeon: Gayland Curry, MD;  Location: [place];  Service: General;  Laterality: Left;  . INSERTION OF MESH Left 04/15/2013   Procedure: INSERTION OF MESH;  Surgeon: Gayland Curry, MD;  Location: [place];  Service: General;  Laterality: Left;  . KNEE ARTHROSCOPY Right 1990's  . LAPAROSCOPY N/A 04/15/2013   Procedure: LAPAROSCOPY DIAGNOSTIC;  Surgeon: Gayland Curry, MD;  Location: [place];  Service: General;  Laterality: N/A;  . LUMBAR LAMINECTOMY/DECOMPRESSION MICRODISCECTOMY Right 07/22/2015   Procedure: Right Lumbar Two-Three Microdiscectomy;  Surgeon: Earnie Larsson, MD;  Location: [place] [facility];  Service: Neurosurgery;  Laterality: Right;  . NASAL SEPTUM SURGERY Bilateral 2014   "removed polyps & infection"  . POLYPECTOMY  01/10/2011   Procedure: POLYPECTOMY;  Surgeon: Lear Ng, MD;  [facility];  Service: Endoscopy;  Laterality: N/A;  . POSTERIOR LUMBAR FUSION  11/1995   "ray cages" (04/15/2013)  . SHOULDER ARTHROSCOPY Bilateral    "twice each"  . TONSILLECTOMY  1960's  . TOTAL KNEE ARTHROPLASTY Right 02/24/2014   Procedure: TOTAL RIGHT KNEE ARTHROPLASTY;  Surgeon: Mauri Pole, MD;  Location: [facility];  Service: Orthopedics;  Laterality: Right;  Marland Kitchen VASECTOMY  1988  . WISDOM TOOTH EXTRACTION  2000's   Social History   Social History Narrative  . No narrative on file     Objective: Vital Signs: BP (!) 160/108   Pulse 92   Resp 14   Ht 5' 8.5" (1.74 m)   Wt 240 lb (108.9 kg)   BMI 35.96 kg/m    Physical Exam  Constitutional: He is oriented to person, place, and time. He appears well-developed and well-nourished.  HENT:  Head: Normocephalic and atraumatic.  Eyes: Conjunctivae  and EOM are normal. Pupils are equal, round, and reactive to light.  Neck: Normal range of motion. Neck supple.  Cardiovascular: Normal rate, regular rhythm and normal heart sounds.   Pulmonary/Chest: Effort normal and breath sounds normal.  Abdominal: Soft. Bowel sounds are normal.  Neurological: He is alert and oriented to person, place, and time.  Skin: Skin is warm and dry. Capillary refill takes less than 2 seconds.  Psychiatric: He has a normal mood and affect. His behavior is normal.  Nursing note and vitals reviewed.    Musculoskeletal Exam: C-spine, thoracic, lumbar spine limited range of motion with no discomfort. Shoulder joints elbow joints wrist joint MCPs PIPs with good range of motion with no synovitis. Hip joints knee joints ankles MTPs PIPs with good range of motion with no synovitis. His right total knee replacement appears to be doing well.  CDAI Exam: No CDAI exam completed.    Investigation: Findings:  12/07/2009 X-rays were obtained of his bilateral knee joints in the office today.  These reveal right lateral osteophytes and bilateral intercondylar osteophytes, right worse than left.  There is bilateral CMP and bilateral moderate medial  compartmental narrowing, right worse than left.    November 10 17 uric acid 6.7    Imaging: No results found.  Speciality Comments: No specialty comments available.    Procedures:  No procedures performed Allergies: Flexeril [cyclobenzaprine] and Tetracyclines & related   Assessment / Plan:     Visit Diagnoses: Fibromyalgia: He has generalized pain and positive tender points and myalgias.  Idiopathic chronic gout of multiple sites without tophus - Allopurinol 450 mg by mouth daily, colchicine. His last uric acid was 6.5 in November. I've advised him to take allopurinol on regular basis and take colchicine on a when necessary basis. We will refill his allopurinol today. I've advised him to get his CBC CMP and uric acid with  his PCP next visit.  Primary osteoarthritis of both knees: He has chronic pain  S/P right TKA: Doing fairly well  DJD (degenerative joint disease), cervical: Some stiffness  Spondylosis of lumbar region without myelopathy or radiculopathy - Discectomy May 2017: Ongoing pain and discomfort  His other medical problems are listed as follows:  Morbid obesity ([place])  Chronic kidney disease (CKD)  Essential hypertension  Dyslipidemia  Other sleep apnea    Orders: No orders of the defined types were placed in this encounter.  No orders of the defined types were placed in this encounter.   Face-to-face time spent with patient was 30 minutes. 50% of time was spent in counseling and coordination of care.  Follow-Up Instructions: Return in about 6 months (around 10/28/2016) for Gout, FMS.   Bo Merino, MD  Note - This record has been created using Editor, commissioning.  Chart creation errors have been sought, but may not always  have been located. Such creation errors do not reflect on  the standard of medical care.

## 2016-04-24 NOTE — Telephone Encounter (Signed)
ERROR

## 2016-04-26 DIAGNOSIS — G4730 Sleep apnea, unspecified: Secondary | ICD-10-CM | POA: Insufficient documentation

## 2016-04-26 DIAGNOSIS — N183 Chronic kidney disease, stage 3 (moderate): Secondary | ICD-10-CM

## 2016-04-26 DIAGNOSIS — N1831 Chronic kidney disease, stage 3a: Secondary | ICD-10-CM | POA: Insufficient documentation

## 2016-04-26 DIAGNOSIS — M47812 Spondylosis without myelopathy or radiculopathy, cervical region: Secondary | ICD-10-CM | POA: Insufficient documentation

## 2016-04-26 DIAGNOSIS — E785 Hyperlipidemia, unspecified: Secondary | ICD-10-CM | POA: Insufficient documentation

## 2016-04-26 DIAGNOSIS — I10 Essential (primary) hypertension: Secondary | ICD-10-CM | POA: Insufficient documentation

## 2016-04-27 ENCOUNTER — Ambulatory Visit (INDEPENDENT_AMBULATORY_CARE_PROVIDER_SITE_OTHER): Payer: 59 | Admitting: Rheumatology

## 2016-04-27 ENCOUNTER — Encounter: Payer: Self-pay | Admitting: Rheumatology

## 2016-04-27 ENCOUNTER — Ambulatory Visit: Payer: Self-pay | Admitting: Rheumatology

## 2016-04-27 VITALS — BP 160/108 | HR 92 | Resp 14 | Ht 68.5 in | Wt 240.0 lb

## 2016-04-27 DIAGNOSIS — M47816 Spondylosis without myelopathy or radiculopathy, lumbar region: Secondary | ICD-10-CM

## 2016-04-27 DIAGNOSIS — M797 Fibromyalgia: Secondary | ICD-10-CM | POA: Diagnosis not present

## 2016-04-27 DIAGNOSIS — M5030 Other cervical disc degeneration, unspecified cervical region: Secondary | ICD-10-CM

## 2016-04-27 DIAGNOSIS — M47812 Spondylosis without myelopathy or radiculopathy, cervical region: Secondary | ICD-10-CM

## 2016-04-27 DIAGNOSIS — M1A09X0 Idiopathic chronic gout, multiple sites, without tophus (tophi): Secondary | ICD-10-CM

## 2016-04-27 DIAGNOSIS — Z96651 Presence of right artificial knee joint: Secondary | ICD-10-CM | POA: Diagnosis not present

## 2016-04-27 DIAGNOSIS — N183 Chronic kidney disease, stage 3 (moderate): Secondary | ICD-10-CM

## 2016-04-27 DIAGNOSIS — M170 Bilateral primary osteoarthritis of knee: Secondary | ICD-10-CM | POA: Diagnosis not present

## 2016-04-27 DIAGNOSIS — N1831 Chronic kidney disease, stage 3a: Secondary | ICD-10-CM

## 2016-04-27 MED ORDER — ALLOPURINOL 300 MG PO TABS: 450.0000 mg | ORAL_TABLET | Freq: Every day | ORAL | 0 refills | Status: DC

## 2016-04-27 NOTE — Progress Notes (Signed)
Patients wife was able to pull up labs on her phone for patient . They were done on Jan 07 2016  His CMP was normal except for elevated K+ 5.6, Uric Acid was 6.5. CBC not performed.Dr Estanislado Pandy has provided a note for his PCP to get CBC CMP and Uric acid with next labs which are due May 2018.

## 2016-05-24 ENCOUNTER — Other Ambulatory Visit: Payer: Self-pay | Admitting: Rheumatology

## 2016-05-24 NOTE — Telephone Encounter (Signed)
Last Visit: 04/27/16 Next Visit due 10/2016. Message sent to the front to schedule patient.  Labs: His CMP was normal except for elevated K+ 5.6, Uric Acid was 6.5. CBC not performed.Dr Estanislado Pandy has provided a note for his PCP to get CBC CMP and Uric acid with next labs which are due May 2018.   Okay to refill Colchicine?

## 2016-05-24 NOTE — Telephone Encounter (Signed)
Please make sure that patient had repeat potassium done with his PCP. He should be okay to refill colchicine.

## 2016-05-24 NOTE — Telephone Encounter (Signed)
Patient has had his Potassium recheck with PCP.

## 2016-05-25 ENCOUNTER — Telehealth: Payer: Self-pay | Admitting: Rheumatology

## 2016-05-25 NOTE — Telephone Encounter (Signed)
LVMOM for patient to call the office back to schedule his fu appointment.

## 2016-05-25 NOTE — Telephone Encounter (Signed)
-----   Message from Carole Binning, LPN sent at 10/10/4816  1:13 PM EDT ----- Regarding: Please scheudle patient for follow up visit Please scheudle patient for follow up visit. Patient due 10/2016. Thanks!

## 2016-05-31 ENCOUNTER — Telehealth: Payer: Self-pay | Admitting: Rheumatology

## 2016-05-31 NOTE — Telephone Encounter (Signed)
Spoke with patient's wife in regards to his fu appointment that is due in September.  She stated that she will need to call us back when she had his calendar in front of her.

## 2016-05-31 NOTE — Telephone Encounter (Signed)
-----   Message from Carole Binning, LPN sent at 9/55/8316  1:13 PM EDT ----- Regarding: Please scheudle patient for follow up visit Please scheudle patient for follow up visit. Patient due 10/2016. Thanks!

## 2016-06-16 ENCOUNTER — Other Ambulatory Visit: Payer: Self-pay | Admitting: Family Medicine

## 2016-06-16 DIAGNOSIS — Z122 Encounter for screening for malignant neoplasm of respiratory organs: Secondary | ICD-10-CM

## 2016-06-28 ENCOUNTER — Other Ambulatory Visit: Payer: Self-pay | Admitting: Rheumatology

## 2016-06-28 NOTE — Telephone Encounter (Signed)
Last Visit: 04/27/16 Next Visit: 11/10/16  Okay to refill Gabapentin?

## 2016-06-29 ENCOUNTER — Telehealth: Payer: Self-pay | Admitting: Rheumatology

## 2016-06-29 MED ORDER — PREDNISONE 5 MG PO TABS: ORAL_TABLET | ORAL | 0 refills | Status: DC

## 2016-06-29 NOTE — Telephone Encounter (Signed)
Pred  5mg , 4x2d, 3x2d,2x2d,1x2d

## 2016-06-29 NOTE — Telephone Encounter (Signed)
Prescription sent to pharmacy and Mrs. Payton notified.

## 2016-06-29 NOTE — Telephone Encounter (Signed)
Patient is having a gout flare that started after his birthday. Patient is having swelling, redness, heat and pain in left knee and foot. Patient would like to know if he can have a prescription for prednisone sent to the pharmacy. Patient is currently taking his allopurinol and colchcine as prescribed.

## 2016-06-29 NOTE — Telephone Encounter (Signed)
Patients wife calling to see if she can get meds for patient. He is having a gout flare up, and in a lot of pain x3 days now.[facility] uses [facility]. Please call wife to inform if medication called in.

## 2016-06-30 ENCOUNTER — Ambulatory Visit: Payer: 59

## 2016-07-04 ENCOUNTER — Ambulatory Visit
Admission: RE | Admit: 2016-07-04 | Discharge: 2016-07-04 | Disposition: A | Discharge status: Home / Self Care | Payer: 59 | Source: Ambulatory Visit | Attending: Family Medicine | Admitting: Family Medicine

## 2016-07-04 ENCOUNTER — Other Ambulatory Visit: Payer: Self-pay | Admitting: Family Medicine

## 2016-07-04 DIAGNOSIS — R059 Cough, unspecified: Secondary | ICD-10-CM

## 2016-07-04 DIAGNOSIS — R05 Cough: Secondary | ICD-10-CM

## 2016-07-04 DIAGNOSIS — Z122 Encounter for screening for malignant neoplasm of respiratory organs: Secondary | ICD-10-CM

## 2016-07-05 ENCOUNTER — Telehealth: Payer: Self-pay | Admitting: *Deleted

## 2016-07-05 NOTE — Telephone Encounter (Signed)
[facility] unable to [facility] due to full message box on phone, [facility], I spoke to patient, patient requested I leave message so that [facility] can call [facility] to schedule delivery of Gel One [phone].

## 2016-07-17 ENCOUNTER — Other Ambulatory Visit: Payer: Self-pay | Admitting: Surgery

## 2016-07-17 DIAGNOSIS — K4090 Unilateral inguinal hernia, without obstruction or gangrene, not specified as recurrent: Secondary | ICD-10-CM

## 2016-07-18 ENCOUNTER — Other Ambulatory Visit: Payer: Self-pay | Admitting: Rheumatology

## 2016-07-18 NOTE — Telephone Encounter (Signed)
Last Visit: 04/27/16 Next Visit: 11/10/16  Okay to refill Ambien CR?

## 2016-07-18 NOTE — Telephone Encounter (Signed)
ok 

## 2016-07-19 ENCOUNTER — Other Ambulatory Visit: Payer: Self-pay | Admitting: Rheumatology

## 2016-07-19 ENCOUNTER — Ambulatory Visit
Admission: RE | Admit: 2016-07-19 | Discharge: 2016-07-19 | Disposition: A | Discharge status: Home / Self Care | Payer: 59 | Source: Ambulatory Visit | Attending: Surgery | Admitting: Surgery

## 2016-07-19 DIAGNOSIS — K4090 Unilateral inguinal hernia, without obstruction or gangrene, not specified as recurrent: Secondary | ICD-10-CM

## 2016-07-19 MED ORDER — IOPAMIDOL (ISOVUE-300) INJECTION 61%
100.0000 mL | Freq: Once | INTRAVENOUS | Status: AC | PRN
  Administered 2016-07-19: 100 mL via INTRAVENOUS

## 2016-07-19 NOTE — Telephone Encounter (Signed)
Last Visit: 04/27/16 Next Visit: 11/10/16 Labs: 06/13/16 Potassium 5.6 otherwise WNL  Okay to refill allopurinol?

## 2016-07-19 NOTE — Telephone Encounter (Signed)
Patient is aware. Results came from PCP

## 2016-07-19 NOTE — Telephone Encounter (Signed)
Okay to refill please notify patient of the potassium level and fax to PCP

## 2016-07-27 ENCOUNTER — Other Ambulatory Visit: Payer: Self-pay | Admitting: Neurosurgery

## 2016-07-27 DIAGNOSIS — M5416 Radiculopathy, lumbar region: Secondary | ICD-10-CM

## 2016-08-08 ENCOUNTER — Ambulatory Visit
Admission: RE | Admit: 2016-08-08 | Discharge: 2016-08-08 | Disposition: A | Discharge status: Home / Self Care | Payer: 59 | Source: Ambulatory Visit | Attending: Neurosurgery | Admitting: Neurosurgery

## 2016-08-08 DIAGNOSIS — M5416 Radiculopathy, lumbar region: Secondary | ICD-10-CM

## 2016-08-16 ENCOUNTER — Other Ambulatory Visit: Payer: Self-pay | Admitting: Neurosurgery

## 2016-08-21 ENCOUNTER — Other Ambulatory Visit: Payer: Self-pay | Admitting: Rheumatology

## 2016-08-21 NOTE — Telephone Encounter (Signed)
To refill per Dr. Estanislado Pandy

## 2016-08-21 NOTE — Telephone Encounter (Signed)
Last Visit: 04/27/16 Next Visit: 11/10/16 Labs: 06/13/16 Potassium 5.6 otherwise WNL  Okay to refill Colchicine?

## 2016-08-28 NOTE — Pre-Procedure Instructions (Signed)
Johnthan Axtman Mazzarella  08/28/2016      [id number] [place], [place] - [address] [place] [address] Phone: [phone] Fax: [place], [place] - [address] [place] [address] Phone: [phone] Fax: [phone]    Your procedure is scheduled on July10  Report to [facility] Admitting at 0600 A.M.  Call this number if you have problems the morning of surgery:  [phone]   Remember:  Do not eat food or drink liquids after midnight.   Take these medicines the morning of surgery with A SIP OF WATER allopurinol (ZYLOPRIM), ALPRAZolam (XANAX), atenolol (TENORMIN) , Colchicine , diazepam (VALIUM), fluticasone (FLONASE), gabapentin (NEURONTIN) , omeprazole (PRILOSEC), oxyCODONE-acetaminophen (PERCOCET/ROXICET), venlafaxine (EFFEXOR-XR)   7 days prior to surgery STOP taking any Aspirin, Aleve, Naproxen, Ibuprofen, Motrin, Advil, Goody's, BC's, all herbal medications, fish oil, and all vitamins    Do not wear jewelry  Do not wear lotions, powders, or cologne, or deoderant.  Men may shave face and neck.  Do not bring valuables to the hospital.  [facility] is not responsible for any belongings or valuables.  Contacts, dentures or bridgework may not be worn into surgery.  Leave your suitcase in the car.  After surgery it may be brought to your room.  For patients admitted to the hospital, discharge time will be determined by your treatment team.  Patients discharged the day of surgery will not be allowed to drive home.    Special instructions:   [facility]- Preparing For Surgery  Before surgery, you can play an important role. Because skin is not sterile, your skin needs to be as free of germs as possible. You can reduce the number of germs on your skin by washing with CHG (chlorahexidine gluconate) Soap before surgery.   CHG is an antiseptic cleaner which kills germs and bonds with the skin to continue killing germs even after washing.  Please do not use if you have an allergy to CHG or antibacterial soaps. If your skin becomes reddened/irritated stop using the CHG.  Do not shave (including legs and underarms) for at least 48 hours prior to first CHG shower. It is OK to shave your face.  Please follow these instructions carefully.   1. Shower the NIGHT BEFORE SURGERY and the MORNING OF SURGERY with CHG.   2. If you chose to wash your hair, wash your hair first as usual with your normal shampoo.  3. After you shampoo, rinse your hair and body thoroughly to remove the shampoo.  4. Use CHG as you would any other liquid soap. You can apply CHG directly to the skin and wash gently with a scrungie or a clean washcloth.   5. Apply the CHG Soap to your body ONLY FROM THE NECK DOWN.  Do not use on open wounds or open sores. Avoid contact with your eyes, ears, mouth and genitals (private parts). Wash genitals (private parts) with your normal soap.  6. Wash thoroughly, paying special attention to the area where your surgery will be performed.  7. Thoroughly rinse your body with warm water from the neck down.  8. DO NOT shower/wash with your normal soap after using and rinsing off the CHG Soap.  9. Pat yourself dry with a CLEAN TOWEL.   10. Wear CLEAN PAJAMAS   11. Place CLEAN SHEETS on your bed the night of  your first shower and DO NOT SLEEP WITH PETS.    Day of Surgery: Do not apply any deodorants/lotions. Please wear clean clothes to the hospital/surgery center.      Please read over the following fact sheets that you were given.

## 2016-08-29 ENCOUNTER — Encounter (HOSPITAL_COMMUNITY): Payer: Self-pay

## 2016-08-29 ENCOUNTER — Encounter (HOSPITAL_COMMUNITY)
Admission: RE | Admit: 2016-08-29 | Discharge: 2016-08-29 | Disposition: A | Discharge status: Home / Self Care | Payer: 59 | Source: Ambulatory Visit | Attending: Neurosurgery | Admitting: Neurosurgery

## 2016-08-29 DIAGNOSIS — M5124 Other intervertebral disc displacement, thoracic region: Secondary | ICD-10-CM | POA: Insufficient documentation

## 2016-08-29 DIAGNOSIS — Z01818 Encounter for other preprocedural examination: Secondary | ICD-10-CM | POA: Diagnosis not present

## 2016-08-29 HISTORY — DX: Prediabetes: R73.03

## 2016-08-29 LAB — CBC WITH DIFFERENTIAL/PLATELET
BASOS ABS: 0 10*3/uL (ref 0.0–0.1)
Basophils Relative: 0 %
Eosinophils Absolute: 0.6 10*3/uL (ref 0.0–0.7)
Eosinophils Relative: 7 %
HEMATOCRIT: 47.6 % (ref 39.0–52.0)
HEMOGLOBIN: 16.9 g/dL (ref 13.0–17.0)
LYMPHS PCT: 29 %
Lymphs Abs: 2.3 10*3/uL (ref 0.7–4.0)
MCH: 32.4 pg (ref 26.0–34.0)
MCHC: 35.5 g/dL (ref 30.0–36.0)
MCV: 91.2 fL (ref 78.0–100.0)
Monocytes Absolute: 0.8 10*3/uL (ref 0.1–1.0)
Monocytes Relative: 10 %
Neutro Abs: 4.3 10*3/uL (ref 1.7–7.7)
Neutrophils Relative %: 54 %
Platelets: 183 10*3/uL (ref 150–400)
RBC: 5.22 MIL/uL (ref 4.22–5.81)
RDW: 13.7 % (ref 11.5–15.5)
WBC: 8 10*3/uL (ref 4.0–10.5)

## 2016-08-29 LAB — BASIC METABOLIC PANEL
ANION GAP: 10 (ref 5–15)
BUN: 10 mg/dL (ref 6–20)
CALCIUM: 9.5 mg/dL (ref 8.9–10.3)
CO2: 21 mmol/L — ABNORMAL LOW (ref 22–32)
Chloride: 102 mmol/L (ref 101–111)
Creatinine, Ser: 0.8 mg/dL (ref 0.61–1.24)
Glucose, Bld: 115 mg/dL — ABNORMAL HIGH (ref 65–99)
Potassium: 4.3 mmol/L (ref 3.5–5.1)
Sodium: 133 mmol/L — ABNORMAL LOW (ref 135–145)

## 2016-08-29 LAB — SURGICAL PCR SCREEN
MRSA, PCR: NEGATIVE
Staphylococcus aureus: NEGATIVE

## 2016-08-29 NOTE — Progress Notes (Signed)
PCP: Dr. Antony Contras @ [facility]  Had stress test early 2000's, Dr. Bronwen Betters

## 2016-08-31 NOTE — Progress Notes (Signed)
Anesthesia Chart Review:  Pt is a 61 year old male scheduled for T12-L1 microdiscectomy on 09/05/2016 with Earnie Larsson, MD.   - PCP is Antony Contras, MD  PMH includes:  S/p lumbar microdiscectomy 07/22/15.  S/p R TKA 02/24/14. S/p inguinal hernia repair 04/15/13.   Medications include: Atenolol, lisinopril, Prilosec  BP (!) 154/100 Comment: notified Hotel manager  Pulse (!) 52   Temp 36.6 C   Resp 20   Ht 5\' 9"  (1.753 m)   Wt 244 lb 8 oz (110.9 kg)   SpO2 100%   BMI 36.11 kg/m    - I spoke with pt by phone.  His BP usually is no more than 130's over 90.  When he is in pain, it becomes elevated.  He is taking his medication as prescribed.  He will monitor his BP at home, and if it is greater than 160 over low 90's, he will reach out to his PCP before surgery.   Preoperative labs reviewed.    CXR 07/04/16: There is no pneumonia, CHF, nor other acute cardiopulmonary abnormality. Thoracic aortic atherosclerosis.  EKG 08/29/16: Sinus bradycardia (57 bpm) with 1st degree A-V block  If BP acceptable DOS, I anticipate pt can proceed as scheduled.   Willeen Cass, FNP-BC [facility]/Anesthesiology Phone: [phone] 08/31/2016 2:28 PM

## 2016-09-05 ENCOUNTER — Encounter (HOSPITAL_COMMUNITY): Payer: Self-pay

## 2016-09-05 ENCOUNTER — Ambulatory Visit (HOSPITAL_COMMUNITY): Payer: 59 | Admitting: Certified Registered Nurse Anesthetist

## 2016-09-05 ENCOUNTER — Ambulatory Visit (HOSPITAL_COMMUNITY): Payer: 59

## 2016-09-05 ENCOUNTER — Encounter (HOSPITAL_COMMUNITY)
Admission: RE | Disposition: A | Discharge status: Home / Self Care | Payer: Self-pay | Source: Ambulatory Visit | Attending: Neurosurgery

## 2016-09-05 ENCOUNTER — Observation Stay (HOSPITAL_COMMUNITY)
Admission: RE | Admit: 2016-09-05 | Discharge: 2016-09-05 | Disposition: A | Discharge status: Home / Self Care | Payer: 59 | Source: Ambulatory Visit | Attending: Neurosurgery | Admitting: Neurosurgery

## 2016-09-05 ENCOUNTER — Ambulatory Visit (HOSPITAL_COMMUNITY): Payer: 59 | Admitting: Emergency Medicine

## 2016-09-05 DIAGNOSIS — G4730 Sleep apnea, unspecified: Secondary | ICD-10-CM | POA: Diagnosis not present

## 2016-09-05 DIAGNOSIS — M549 Dorsalgia, unspecified: Secondary | ICD-10-CM | POA: Diagnosis present

## 2016-09-05 DIAGNOSIS — M109 Gout, unspecified: Secondary | ICD-10-CM | POA: Diagnosis not present

## 2016-09-05 DIAGNOSIS — M5124 Other intervertebral disc displacement, thoracic region: Secondary | ICD-10-CM | POA: Diagnosis present

## 2016-09-05 DIAGNOSIS — K219 Gastro-esophageal reflux disease without esophagitis: Secondary | ICD-10-CM | POA: Insufficient documentation

## 2016-09-05 DIAGNOSIS — Z791 Long term (current) use of non-steroidal anti-inflammatories (NSAID): Secondary | ICD-10-CM | POA: Diagnosis not present

## 2016-09-05 DIAGNOSIS — I440 Atrioventricular block, first degree: Secondary | ICD-10-CM | POA: Insufficient documentation

## 2016-09-05 DIAGNOSIS — Z87891 Personal history of nicotine dependence: Secondary | ICD-10-CM | POA: Diagnosis not present

## 2016-09-05 DIAGNOSIS — M5105 Intervertebral disc disorders with myelopathy, thoracolumbar region: Secondary | ICD-10-CM | POA: Diagnosis not present

## 2016-09-05 DIAGNOSIS — F419 Anxiety disorder, unspecified: Secondary | ICD-10-CM | POA: Diagnosis not present

## 2016-09-05 DIAGNOSIS — M5115 Intervertebral disc disorders with radiculopathy, thoracolumbar region: Secondary | ICD-10-CM | POA: Diagnosis not present

## 2016-09-05 DIAGNOSIS — I10 Essential (primary) hypertension: Secondary | ICD-10-CM | POA: Insufficient documentation

## 2016-09-05 DIAGNOSIS — Z79899 Other long term (current) drug therapy: Secondary | ICD-10-CM | POA: Diagnosis not present

## 2016-09-05 DIAGNOSIS — Z96651 Presence of right artificial knee joint: Secondary | ICD-10-CM | POA: Insufficient documentation

## 2016-09-05 DIAGNOSIS — Z419 Encounter for procedure for purposes other than remedying health state, unspecified: Secondary | ICD-10-CM

## 2016-09-05 HISTORY — PX: LUMBAR LAMINECTOMY/DECOMPRESSION MICRODISCECTOMY: SHX5026

## 2016-09-05 SURGERY — LUMBAR LAMINECTOMY/DECOMPRESSION MICRODISCECTOMY 1 LEVEL
Anesthesia: General | Site: Back | Laterality: Right

## 2016-09-05 MED ORDER — LIDOCAINE HCL (CARDIAC) 20 MG/ML IV SOLN
INTRAVENOUS | Status: DC | PRN
  Administered 2016-09-05: 40 mg via INTRAVENOUS

## 2016-09-05 MED ORDER — THROMBIN 5000 UNITS EX SOLR
CUTANEOUS | Status: DC | PRN
  Administered 2016-09-05 (×2): 5000 [IU] via TOPICAL

## 2016-09-05 MED ORDER — ONDANSETRON HCL 4 MG/2ML IJ SOLN: 4.0000 mg | Freq: Four times a day (QID) | INTRAMUSCULAR | Status: DC | PRN

## 2016-09-05 MED ORDER — CHLORHEXIDINE GLUCONATE CLOTH 2 % EX PADS: 6.0000 | MEDICATED_PAD | Freq: Once | CUTANEOUS | Status: DC

## 2016-09-05 MED ORDER — GLYCOPYRROLATE 0.2 MG/ML IJ SOLN
INTRAMUSCULAR | Status: DC | PRN
  Administered 2016-09-05: 0.2 mg via INTRAVENOUS

## 2016-09-05 MED ORDER — OXYCODONE HCL 5 MG PO TABS
5.0000 mg | ORAL_TABLET | Freq: Once | ORAL | Status: AC | PRN
  Administered 2016-09-05: 5 mg via ORAL

## 2016-09-05 MED ORDER — KETOROLAC TROMETHAMINE 30 MG/ML IJ SOLN
INTRAMUSCULAR | Status: AC
  Filled 2016-09-05: qty 1

## 2016-09-05 MED ORDER — SALINE SPRAY 0.65 % NA SOLN: 1.0000 | Freq: Every evening | NASAL | Status: DC | PRN

## 2016-09-05 MED ORDER — ONDANSETRON HCL 4 MG PO TABS: 4.0000 mg | ORAL_TABLET | Freq: Four times a day (QID) | ORAL | Status: DC | PRN

## 2016-09-05 MED ORDER — LACTATED RINGERS IV SOLN
INTRAVENOUS | Status: DC | PRN
  Administered 2016-09-05 (×2): via INTRAVENOUS

## 2016-09-05 MED ORDER — HYDROMORPHONE HCL 1 MG/ML IJ SOLN: 0.2500 mg | INTRAMUSCULAR | Status: DC | PRN

## 2016-09-05 MED ORDER — MIDAZOLAM HCL 5 MG/5ML IJ SOLN
INTRAMUSCULAR | Status: DC | PRN
  Administered 2016-09-05: 2 mg via INTRAVENOUS

## 2016-09-05 MED ORDER — THROMBIN 5000 UNITS EX SOLR
CUTANEOUS | Status: AC
  Filled 2016-09-05: qty 10000

## 2016-09-05 MED ORDER — MENTHOL 3 MG MT LOZG: 1.0000 | LOZENGE | OROMUCOSAL | Status: DC | PRN

## 2016-09-05 MED ORDER — SUGAMMADEX SODIUM 200 MG/2ML IV SOLN
INTRAVENOUS | Status: AC
  Filled 2016-09-05: qty 2

## 2016-09-05 MED ORDER — CEFAZOLIN SODIUM-DEXTROSE 2-4 GM/100ML-% IV SOLN
2.0000 g | INTRAVENOUS | Status: AC
  Administered 2016-09-05: 2 g via INTRAVENOUS
  Filled 2016-09-05: qty 100

## 2016-09-05 MED ORDER — PANTOPRAZOLE SODIUM 40 MG PO TBEC: 40.0000 mg | DELAYED_RELEASE_TABLET | Freq: Every day | ORAL | Status: DC

## 2016-09-05 MED ORDER — PHENOL 1.4 % MT LIQD: 1.0000 | OROMUCOSAL | Status: DC | PRN

## 2016-09-05 MED ORDER — KETOROLAC TROMETHAMINE 15 MG/ML IJ SOLN
30.0000 mg | Freq: Four times a day (QID) | INTRAMUSCULAR | Status: DC
  Administered 2016-09-05: 30 mg via INTRAVENOUS
  Filled 2016-09-05: qty 2

## 2016-09-05 MED ORDER — HEMOSTATIC AGENTS (NO CHARGE) OPTIME
TOPICAL | Status: DC | PRN
  Administered 2016-09-05: 1 via TOPICAL

## 2016-09-05 MED ORDER — OXYCODONE HCL 5 MG PO TABS
10.0000 mg | ORAL_TABLET | ORAL | Status: DC | PRN
  Administered 2016-09-05: 10 mg via ORAL
  Filled 2016-09-05: qty 2

## 2016-09-05 MED ORDER — ONDANSETRON HCL 4 MG/2ML IJ SOLN
INTRAMUSCULAR | Status: DC | PRN
  Administered 2016-09-05: 4 mg via INTRAVENOUS

## 2016-09-05 MED ORDER — ONDANSETRON HCL 4 MG/2ML IJ SOLN
INTRAMUSCULAR | Status: AC
  Filled 2016-09-05: qty 2

## 2016-09-05 MED ORDER — SODIUM CHLORIDE 0.9 % IR SOLN
Status: DC | PRN
  Administered 2016-09-05: 08:00:00

## 2016-09-05 MED ORDER — TURMERIC 500 MG PO CAPS
500.0000 mg | ORAL_CAPSULE | Freq: Every day | ORAL | Status: DC
Start: 2016-09-05 — End: 2016-09-05

## 2016-09-05 MED ORDER — HYDROMORPHONE HCL 1 MG/ML IJ SOLN: 0.5000 mg | INTRAMUSCULAR | Status: DC | PRN

## 2016-09-05 MED ORDER — HYDROCODONE-ACETAMINOPHEN 5-325 MG PO TABS: 1.0000 | ORAL_TABLET | ORAL | Status: DC | PRN

## 2016-09-05 MED ORDER — EPHEDRINE SULFATE 50 MG/ML IJ SOLN
INTRAMUSCULAR | Status: DC | PRN
  Administered 2016-09-05 (×3): 5 mg via INTRAVENOUS

## 2016-09-05 MED ORDER — PHENYLEPHRINE 40 MCG/ML (10ML) SYRINGE FOR IV PUSH (FOR BLOOD PRESSURE SUPPORT)
PREFILLED_SYRINGE | INTRAVENOUS | Status: AC
  Filled 2016-09-05: qty 20

## 2016-09-05 MED ORDER — EPHEDRINE 5 MG/ML INJ
INTRAVENOUS | Status: AC
  Filled 2016-09-05: qty 20

## 2016-09-05 MED ORDER — PHENYLEPHRINE HCL 10 MG/ML IJ SOLN
INTRAMUSCULAR | Status: DC | PRN
  Administered 2016-09-05: 20 ug/min via INTRAVENOUS

## 2016-09-05 MED ORDER — ZOLPIDEM TARTRATE 5 MG PO TABS: 5.0000 mg | ORAL_TABLET | Freq: Every evening | ORAL | Status: DC | PRN

## 2016-09-05 MED ORDER — 0.9 % SODIUM CHLORIDE (POUR BTL) OPTIME
TOPICAL | Status: DC | PRN
  Administered 2016-09-05: 1000 mL

## 2016-09-05 MED ORDER — ATENOLOL 100 MG PO TABS: 100.0000 mg | ORAL_TABLET | Freq: Every morning | ORAL | Status: DC

## 2016-09-05 MED ORDER — BUPIVACAINE HCL (PF) 0.25 % IJ SOLN
INTRAMUSCULAR | Status: DC | PRN
  Administered 2016-09-05: 20 mL

## 2016-09-05 MED ORDER — MAGNESIUM HYDROXIDE 400 MG/5ML PO SUSP: 30.0000 mL | Freq: Every day | ORAL | Status: DC | PRN

## 2016-09-05 MED ORDER — ALPRAZOLAM 0.5 MG PO TABS: 1.0000 mg | ORAL_TABLET | Freq: Three times a day (TID) | ORAL | Status: DC | PRN

## 2016-09-05 MED ORDER — METHOCARBAMOL 500 MG PO TABS: 500.0000 mg | ORAL_TABLET | Freq: Four times a day (QID) | ORAL | 1 refills | Status: DC | PRN

## 2016-09-05 MED ORDER — KETOROLAC TROMETHAMINE 30 MG/ML IJ SOLN
INTRAMUSCULAR | Status: DC | PRN
  Administered 2016-09-05: 30 mg via INTRAVENOUS

## 2016-09-05 MED ORDER — PROPOFOL 10 MG/ML IV BOLUS
INTRAVENOUS | Status: DC | PRN
  Administered 2016-09-05: 160 mg via INTRAVENOUS

## 2016-09-05 MED ORDER — OXYCODONE HCL 5 MG/5ML PO SOLN: 5.0000 mg | Freq: Once | ORAL | Status: AC | PRN

## 2016-09-05 MED ORDER — FENTANYL CITRATE (PF) 100 MCG/2ML IJ SOLN
INTRAMUSCULAR | Status: DC | PRN
  Administered 2016-09-05: 50 ug via INTRAVENOUS
  Administered 2016-09-05: 100 ug via INTRAVENOUS

## 2016-09-05 MED ORDER — BIOTIN 1000 MCG PO TABS: 1000.0000 ug | ORAL_TABLET | Freq: Every day | ORAL | Status: DC

## 2016-09-05 MED ORDER — SODIUM CHLORIDE 0.9% FLUSH: 3.0000 mL | Freq: Two times a day (BID) | INTRAVENOUS | Status: DC

## 2016-09-05 MED ORDER — DIAZEPAM 5 MG PO TABS
5.0000 mg | ORAL_TABLET | Freq: Three times a day (TID) | ORAL | Status: DC | PRN
  Administered 2016-09-05: 5 mg via ORAL
  Filled 2016-09-05: qty 1

## 2016-09-05 MED ORDER — SODIUM CHLORIDE 0.9% FLUSH
3.0000 mL | INTRAVENOUS | Status: DC | PRN
Start: 2016-09-05 — End: 2016-09-05

## 2016-09-05 MED ORDER — PROPOFOL 10 MG/ML IV BOLUS
INTRAVENOUS | Status: AC
  Filled 2016-09-05: qty 20

## 2016-09-05 MED ORDER — CEFAZOLIN SODIUM-DEXTROSE 1-4 GM/50ML-% IV SOLN
1.0000 g | Freq: Three times a day (TID) | INTRAVENOUS | Status: DC
  Administered 2016-09-05: 1 g via INTRAVENOUS
  Filled 2016-09-05: qty 50

## 2016-09-05 MED ORDER — DEXAMETHASONE SODIUM PHOSPHATE 10 MG/ML IJ SOLN
10.0000 mg | INTRAMUSCULAR | Status: AC
  Administered 2016-09-05: 10 mg via INTRAVENOUS
  Filled 2016-09-05: qty 1

## 2016-09-05 MED ORDER — LIDOCAINE HCL (CARDIAC) 20 MG/ML IV SOLN
INTRAVENOUS | Status: AC
  Filled 2016-09-05: qty 5

## 2016-09-05 MED ORDER — ALLOPURINOL 300 MG PO TABS: 450.0000 mg | ORAL_TABLET | Freq: Every day | ORAL | Status: DC

## 2016-09-05 MED ORDER — OMEGA-3-ACID ETHYL ESTERS 1 G PO CAPS: 1.0000 g | ORAL_CAPSULE | Freq: Two times a day (BID) | ORAL | Status: DC

## 2016-09-05 MED ORDER — FENTANYL CITRATE (PF) 250 MCG/5ML IJ SOLN
INTRAMUSCULAR | Status: AC
  Filled 2016-09-05: qty 5

## 2016-09-05 MED ORDER — SUCCINYLCHOLINE CHLORIDE 200 MG/10ML IV SOSY
PREFILLED_SYRINGE | INTRAVENOUS | Status: AC
  Filled 2016-09-05: qty 10

## 2016-09-05 MED ORDER — TAMSULOSIN HCL 0.4 MG PO CAPS: 0.4000 mg | ORAL_CAPSULE | Freq: Every day | ORAL | Status: DC

## 2016-09-05 MED ORDER — ROCURONIUM BROMIDE 50 MG/5ML IV SOLN
INTRAVENOUS | Status: AC
  Filled 2016-09-05: qty 1

## 2016-09-05 MED ORDER — BUPIVACAINE HCL (PF) 0.25 % IJ SOLN
INTRAMUSCULAR | Status: AC
  Filled 2016-09-05: qty 30

## 2016-09-05 MED ORDER — OXYCODONE HCL 5 MG PO TABS
ORAL_TABLET | ORAL | Status: AC
  Filled 2016-09-05: qty 1

## 2016-09-05 MED ORDER — GABAPENTIN 600 MG PO TABS
1200.0000 mg | ORAL_TABLET | Freq: Three times a day (TID) | ORAL | Status: DC
  Administered 2016-09-05: 1200 mg via ORAL
  Filled 2016-09-05: qty 2

## 2016-09-05 MED ORDER — GABAPENTIN 600 MG PO TABS: 1200.0000 mg | ORAL_TABLET | Freq: Three times a day (TID) | ORAL | Status: DC

## 2016-09-05 MED ORDER — LISINOPRIL 2.5 MG PO TABS
2.5000 mg | ORAL_TABLET | Freq: Every morning | ORAL | Status: DC
Start: 2016-09-06 — End: 2016-09-05

## 2016-09-05 MED ORDER — ROCURONIUM BROMIDE 100 MG/10ML IV SOLN
INTRAVENOUS | Status: DC | PRN
  Administered 2016-09-05: 50 mg via INTRAVENOUS

## 2016-09-05 MED ORDER — ADULT MULTIVITAMIN W/MINERALS CH: 1.0000 | ORAL_TABLET | Freq: Every day | ORAL | Status: DC

## 2016-09-05 MED ORDER — MIDAZOLAM HCL 2 MG/2ML IJ SOLN
INTRAMUSCULAR | Status: AC
  Filled 2016-09-05: qty 2

## 2016-09-05 MED ORDER — FLUTICASONE PROPIONATE 50 MCG/ACT NA SUSP: 1.0000 | Freq: Every day | NASAL | Status: DC | PRN

## 2016-09-05 MED ORDER — VENLAFAXINE HCL ER 75 MG PO CP24: 150.0000 mg | ORAL_CAPSULE | Freq: Every morning | ORAL | Status: DC

## 2016-09-05 MED ORDER — SUGAMMADEX SODIUM 200 MG/2ML IV SOLN
INTRAVENOUS | Status: DC | PRN
  Administered 2016-09-05: 200 mg via INTRAVENOUS

## 2016-09-05 MED ORDER — PROMETHAZINE HCL 25 MG/ML IJ SOLN: 6.2500 mg | INTRAMUSCULAR | Status: DC | PRN

## 2016-09-05 MED ORDER — GUAIFENESIN ER 600 MG PO TB12: 600.0000 mg | ORAL_TABLET | Freq: Every evening | ORAL | Status: DC | PRN

## 2016-09-05 SURGICAL SUPPLY — 54 items
ADH SKN CLS APL DERMABOND .7 (GAUZE/BANDAGES/DRESSINGS) ×1
APL SKNCLS STERI-STRIP NONHPOA (GAUZE/BANDAGES/DRESSINGS) ×1
BAG DECANTER FOR FLEXI CONT (MISCELLANEOUS) ×3 IMPLANT
BENZOIN TINCTURE PRP APPL 2/3 (GAUZE/BANDAGES/DRESSINGS) ×3 IMPLANT
BLADE CLIPPER SURG (BLADE) ×2 IMPLANT
BUR CUTTER 7.0 ROUND (BURR) ×3 IMPLANT
BUR MATCHSTICK NEURO 3.0 LAGG (BURR) ×2 IMPLANT
CANISTER SUCT 3000ML PPV (MISCELLANEOUS) ×3 IMPLANT
CARTRIDGE OIL MAESTRO DRILL (MISCELLANEOUS) ×1 IMPLANT
CLOSURE WOUND 1/2 X4 (GAUZE/BANDAGES/DRESSINGS) ×1
DECANTER SPIKE VIAL GLASS SM (MISCELLANEOUS) ×3 IMPLANT
DERMABOND ADVANCED (GAUZE/BANDAGES/DRESSINGS) ×2
DERMABOND ADVANCED .7 DNX12 (GAUZE/BANDAGES/DRESSINGS) ×1 IMPLANT
DIFFUSER DRILL AIR PNEUMATIC (MISCELLANEOUS) ×3 IMPLANT
DRAPE HALF SHEET 40X57 (DRAPES) IMPLANT
DRAPE LAPAROTOMY 100X72X124 (DRAPES) ×3 IMPLANT
DRAPE MICROSCOPE LEICA (MISCELLANEOUS) ×3 IMPLANT
DRAPE POUCH INSTRU U-SHP 10X18 (DRAPES) ×3 IMPLANT
DRAPE SURG 17X23 STRL (DRAPES) ×6 IMPLANT
DRSG OPSITE POSTOP 4X6 (GAUZE/BANDAGES/DRESSINGS) ×2 IMPLANT
DURAPREP 26ML APPLICATOR (WOUND CARE) ×3 IMPLANT
ELECT REM PT RETURN 9FT ADLT (ELECTROSURGICAL) ×3
ELECTRODE REM PT RTRN 9FT ADLT (ELECTROSURGICAL) ×1 IMPLANT
GAUZE SPONGE 4X4 12PLY STRL (GAUZE/BANDAGES/DRESSINGS) ×3 IMPLANT
GAUZE SPONGE 4X4 16PLY XRAY LF (GAUZE/BANDAGES/DRESSINGS) IMPLANT
GLOVE BIOGEL PI IND STRL 7.5 (GLOVE) IMPLANT
GLOVE BIOGEL PI INDICATOR 7.5 (GLOVE) ×2
GLOVE ECLIPSE 7.5 STRL STRAW (GLOVE) ×4 IMPLANT
GLOVE ECLIPSE 9.0 STRL (GLOVE) ×3 IMPLANT
GLOVE EXAM NITRILE LRG STRL (GLOVE) IMPLANT
GLOVE EXAM NITRILE XL STR (GLOVE) IMPLANT
GLOVE EXAM NITRILE XS STR PU (GLOVE) IMPLANT
GOWN STRL REUS W/ TWL LRG LVL3 (GOWN DISPOSABLE) IMPLANT
GOWN STRL REUS W/ TWL XL LVL3 (GOWN DISPOSABLE) ×1 IMPLANT
GOWN STRL REUS W/TWL 2XL LVL3 (GOWN DISPOSABLE) IMPLANT
GOWN STRL REUS W/TWL LRG LVL3 (GOWN DISPOSABLE) ×3
GOWN STRL REUS W/TWL XL LVL3 (GOWN DISPOSABLE) ×6
KIT BASIN OR (CUSTOM PROCEDURE TRAY) ×3 IMPLANT
KIT ROOM TURNOVER OR (KITS) ×3 IMPLANT
NDL SPNL 22GX3.5 QUINCKE BK (NEEDLE) ×1 IMPLANT
NEEDLE HYPO 22GX1.5 SAFETY (NEEDLE) ×3 IMPLANT
NEEDLE SPNL 22GX3.5 QUINCKE BK (NEEDLE) ×3 IMPLANT
NS IRRIG 1000ML POUR BTL (IV SOLUTION) ×3 IMPLANT
OIL CARTRIDGE MAESTRO DRILL (MISCELLANEOUS) ×3
PACK LAMINECTOMY NEURO (CUSTOM PROCEDURE TRAY) ×3 IMPLANT
PAD ARMBOARD 7.5X6 YLW CONV (MISCELLANEOUS) ×9 IMPLANT
RUBBERBAND STERILE (MISCELLANEOUS) ×6 IMPLANT
SPONGE SURGIFOAM ABS GEL SZ50 (HEMOSTASIS) ×3 IMPLANT
STRIP CLOSURE SKIN 1/2X4 (GAUZE/BANDAGES/DRESSINGS) ×2 IMPLANT
SUT VIC AB 2-0 CT1 18 (SUTURE) ×3 IMPLANT
SUT VIC AB 3-0 SH 8-18 (SUTURE) ×3 IMPLANT
TOWEL GREEN STERILE (TOWEL DISPOSABLE) ×3 IMPLANT
TOWEL GREEN STERILE FF (TOWEL DISPOSABLE) ×3 IMPLANT
WATER STERILE IRR 1000ML POUR (IV SOLUTION) ×3 IMPLANT

## 2016-09-05 NOTE — Brief Op Note (Signed)
09/05/2016  9:35 AM  PATIENT:  Victor [name]  61 y.o. male  PRE-OPERATIVE DIAGNOSIS:  Herniated Nucleus Pulposus  POST-OPERATIVE DIAGNOSIS:  Herniated Nucleus Pulposus  PROCEDURE:  Procedure(s): Microdiscectomy - right - T12-L1 (Right)  SURGEON:  Surgeon(s) and Role:    * Earnie Larsson, MD - Primary    * Consuella Lose, MD - Assisting  PHYSICIAN ASSISTANT:   ASSISTANTS:    ANESTHESIA:   general  EBL:  Total I/O In: 1000 [I.V.:1000] Out: 150 [Blood:150]  BLOOD ADMINISTERED:none  DRAINS: none   LOCAL MEDICATIONS USED:  MARCAINE     SPECIMEN:  No Specimen  DISPOSITION OF SPECIMEN:  N/A  COUNTS:  YES  TOURNIQUET:  * No tourniquets in log *  DICTATION: .Dragon Dictation  PLAN OF CARE: Admit for overnight observation  PATIENT DISPOSITION:  PACU - hemodynamically stable.   Delay start of Pharmacological VTE agent (>24hrs) due to surgical blood loss or risk of bleeding: yes

## 2016-09-05 NOTE — Progress Notes (Signed)
PHARMACIST - PHYSICIAN ORDER COMMUNICATION  CONCERNING: P&T Medication Policy on Herbal Medications  DESCRIPTION:  This patient's order for:  Biotin, Tumeric acid  has been noted.  This product(s) is classified as an "herbal" or natural product. Due to a lack of definitive safety studies or FDA approval, nonstandard manufacturing practices, plus the potential risk of unknown drug-drug interactions while on inpatient medications, the Pharmacy and Therapeutics Committee does not permit the use of "herbal" or natural products of this type within [facility].   ACTION TAKEN: The pharmacy department is unable to verify this order at this time and your patient has been informed of this safety policy. Please reevaluate patient's clinical condition at discharge and address if the herbal or natural product(s) should be resumed at that time.

## 2016-09-05 NOTE — Op Note (Signed)
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  Asst. Surgeon: Kathyrn Sheriff  Anesthesia: General  Indication: 61 year old male with severe back and right groin pain failing conservative management. Workup demonstrates evidence of a paracentral disc herniation with spinal cord and nerve root compression at T12-L1. Patient presents now for microdiscectomy in hopes of improving his symptoms.  Operative note: After induction anesthesia, patient position prone onto Wilson frame and a properly padded. Lumbar region and lower thoracic region prepped and draped sterilely. Incision made overlying T12-L1. Dissection performed on the right. Retractor placed. X-ray taken. Level confirmed. Laminotomy performed using high-speed drill and Kerrison rongeurs. Laminotomy was carried out further laterally and the superior medial aspect of the right L1 pedicle was partially resected. Microscope brought field these might dissection the spinal canal. Epidural venous plexus was coagulated and cut. Thecal sac and L1 nerve root were gently mobilized slightly. Disc space and disc herniation were apparent. Disc spaces then incised. Discectomies and performed using pituitary rongeurs and skiing curettes. The disc was pushed down into the disc space and removed laterally using pituitary rongeurs. Medially there were couple significant fragments which were recovered this way. At the conclusion of the microdiscectomy there is no evidence of any residual compression upon the thecal sac or nerve roots. Wound is then irrigated MI solution. Gelfoam was placed topically for hemostasis. Wounds and close in layers of Vicryl sutures. Steri-Strips and sterile dressing were applied. No apparent  complications. Patient tolerated the procedure well and he returns to the recovery room postop.

## 2016-09-05 NOTE — Anesthesia Preprocedure Evaluation (Addendum)
Anesthesia Evaluation  Patient identified by MRN, date of birth, ID band Patient awake    Reviewed: Allergy & Precautions, NPO status , Patient's Chart, lab work & pertinent test results  Airway Mallampati: IV  TM Distance: >3 FB Neck ROM: Limited    Dental no notable dental hx. (+) Teeth Intact,    Pulmonary sleep apnea , former smoker,    Pulmonary exam normal breath sounds clear to auscultation       Cardiovascular hypertension, Pt. on medications and Pt. on home beta blockers Normal cardiovascular exam Rhythm:Regular Rate:Normal  ECG: SB, rate 57, 1st degree AV block   Neuro/Psych Anxiety negative neurological ROS     GI/Hepatic Neg liver ROS, GERD  Medicated and Controlled,  Endo/Other    Renal/GU negative Renal ROS  negative genitourinary   Musculoskeletal  (+) Fibromyalgia -  Abdominal (+) + obese,   Peds negative pediatric ROS (+)  Hematology negative hematology ROS (+)   Anesthesia Other Findings Hyperlipidemia  Gout  Reproductive/Obstetrics negative OB ROS                            BP Readings from Last 3 Encounters:  09/05/16 127/80  08/29/16 (!) 154/100  04/27/16 (!) 160/108   Lab Results  Component Value Date   WBC 8.0 08/29/2016   HGB 16.9 08/29/2016   HCT 47.6 08/29/2016   MCV 91.2 08/29/2016   PLT 183 08/29/2016     Chemistry      Component Value Date/Time   NA 133 (L) 08/29/2016 0929   NA 138 06/17/2012 1349   K 4.3 08/29/2016 0929   K 4.2 06/17/2012 1349   CL 102 08/29/2016 0929   CL 106 06/17/2012 1349   CO2 21 (L) 08/29/2016 0929   CO2 21 (L) 06/17/2012 1349   BUN 10 08/29/2016 0929   BUN 11.1 06/17/2012 1349   CREATININE 0.80 08/29/2016 0929   CREATININE 1.0 06/17/2012 1349      Component Value Date/Time   CALCIUM 9.5 08/29/2016 0929   CALCIUM 9.6 06/17/2012 1349   ALKPHOS 46 04/15/2013 1030   ALKPHOS 58 06/17/2012 1349   AST 71 (H)  04/15/2013 1030   AST 57 (H) 06/17/2012 1349   ALT 40 04/15/2013 1030   ALT 40 06/17/2012 1349   BILITOT 0.8 04/15/2013 1030   BILITOT 0.82 06/17/2012 1349     Lab Results  Component Value Date   HGBA1C 5.4 07/14/2015   EKG 02/17/14: Sinus bradycardia with 1st degree A-V block Otherwise normal ECG Confirmed by Rockey Situ MD, TIMOTHY  Anesthesia Physical  Anesthesia Plan  ASA: III  Anesthesia Plan: General   Post-op Pain Management:    Induction: Intravenous  PONV Risk Score and Plan: 1 and Ondansetron, Dexamethasone, Propofol and Midazolam  Airway Management Planned: Oral ETT and Video Laryngoscope Planned  Additional Equipment:   Intra-op Plan:   Post-operative Plan: Extubation in OR  Informed Consent: I have reviewed the patients History and Physical, chart, labs and discussed the procedure including the risks, benefits and alternatives for the proposed anesthesia with the patient or authorized representative who has indicated his/her understanding and acceptance.   Dental advisory given  Plan Discussed with: CRNA  Anesthesia Plan Comments:  Anesthesia Quick Evaluation

## 2016-09-05 NOTE — Discharge Instructions (Signed)

## 2016-09-05 NOTE — H&P (Signed)
Victor [name] is an 61 y.o. male.   Chief Complaint: Back and right groin pain HPI: 61 year old male with severe pain radiating into his right groin and right testicle failing conservative management. Workup demonstrates evidence of a right-sided T12-L1 paracentral disc herniation with compression of the lower spinal cord and upper cauda equina. Patient is failed conservative management presents now for microdiscectomy in hopes of improving his symptoms.  Past Medical History:  Diagnosis Date  . Anxiety   . Arthritis    "all over" (04/15/2013)  . Back pain, chronic   . DDD (degenerative disc disease), cervical   . DDD (degenerative disc disease), lumbar   . Fibromyalgia   . GERD (gastroesophageal reflux disease)   . Gout   . High cholesterol   . Hypertension   . Numbness of feet   . Pre-diabetes   . Sleep apnea    "don't use CPAP" (04/15/2013)    Past Surgical History:  Procedure Laterality Date  . ANTERIOR CERVICAL DECOMP/DISCECTOMY FUSION  2000's X3   "had 3 surgeries on my front neck" (04/15/2013)  . APPENDECTOMY  ~ 2010  . CARPAL TUNNEL RELEASE Left 1990's  . FLEXIBLE SIGMOIDOSCOPY  12/21/2011   Procedure: FLEXIBLE SIGMOIDOSCOPY;  Surgeon: Lear Ng, MD;  Location: [facility];  Service: Endoscopy;  Laterality: N/A;  . HAND TENDON SURGERY Left ~ 1978 X2-1980's X3   "had 5 ORs after laceration"  . HOT HEMOSTASIS  12/21/2011   Procedure: HOT HEMOSTASIS (ARGON PLASMA COAGULATION/BICAP);  Surgeon: Lear Ng, MD;  Location: [facility];  Service: Endoscopy;  Laterality: N/A;  . INGUINAL HERNIA REPAIR Bilateral 1970's - 1984  . INGUINAL HERNIA REPAIR Left 04/15/2013  . INGUINAL HERNIA REPAIR Left 04/15/2013   Procedure: LAPAROSCOPIC REPAIR OF INCARCERATED LEFT INGUINAL HERNIA;  Surgeon: Gayland Curry, MD;  Location: [facility];  Service: General;  Laterality: Left;  . INSERTION OF MESH Left 04/15/2013   Procedure: INSERTION OF MESH;  Surgeon: Gayland Curry, MD;   Location: [place];  Service: General;  Laterality: Left;  . KNEE ARTHROSCOPY Right 1990's  . LAPAROSCOPY N/A 04/15/2013   Procedure: LAPAROSCOPY DIAGNOSTIC;  Surgeon: Gayland Curry, MD;  Location: [facility];  Service: General;  Laterality: N/A;  . LUMBAR LAMINECTOMY/DECOMPRESSION MICRODISCECTOMY Right 07/22/2015   Procedure: Right Lumbar Two-Three Microdiscectomy;  Surgeon: Earnie Larsson, MD;  Location: [facility];  Service: Neurosurgery;  Laterality: Right;  . NASAL SEPTUM SURGERY Bilateral 2014   "removed polyps & infection"  . POLYPECTOMY  01/10/2011   Procedure: POLYPECTOMY;  Surgeon: Lear Ng, MD;  Location: [facility];  Service: Endoscopy;  Laterality: N/A;  . POSTERIOR LUMBAR FUSION  11/1995   "ray cages" (04/15/2013)  . SHOULDER ARTHROSCOPY Bilateral    "twice each"  . TONSILLECTOMY  1960's  . TOTAL KNEE ARTHROPLASTY Right 02/24/2014   Procedure: TOTAL RIGHT KNEE ARTHROPLASTY;  Surgeon: Mauri Pole, MD;  Location: [facility];  Service: Orthopedics;  Laterality: Right;  Marland Kitchen VASECTOMY  1988  . WISDOM TOOTH EXTRACTION  2000's    Family History  Problem Relation Age of Onset  . Hypertension Father    Social History:  reports that he quit smoking about 6 years ago. His smoking use included Cigarettes and Cigars. He has a 1.50 pack-year smoking history. He has never used smokeless tobacco. He reports that he drinks about 3.0 oz of alcohol per week . He reports that he does not use drugs.  Allergies:  Allergies  Allergen Reactions  .  Flexeril [Cyclobenzaprine] Other (See Comments)    Makes patient irritable  . Tetracyclines & Related Other (See Comments)    " makes me angry"  . Niacin And Related Itching    Medications Prior to Admission  Medication Sig Dispense Refill  . allopurinol (ZYLOPRIM) 300 MG tablet TAKE 1 AND 1/2 TABLETS BY MOUTH DAILY. (Patient taking differently: TAKE 1 AND 1/2 TABLETS (450 mg)  BY MOUTH DAILY.) 135 tablet 0  . ALPRAZolam (XANAX) 1 MG tablet Take  1 mg by mouth every 8 (eight) hours as needed for anxiety.     Marland Kitchen atenolol (TENORMIN) 100 MG tablet Take 100 mg by mouth every morning.     . Biotin 1000 MCG tablet Take 1,000 mcg by mouth daily.    . clobetasol ointment (TEMOVATE) 3.76 % Apply 1 application topically 2 (two) times daily as needed (for skin irritation.).     Marland Kitchen colchicine 0.6 MG tablet TAKE 1 TABLET BY MOUTH TWICE A DAY (Patient taking differently: TAKE 1 TABLET BY MOUTH DAILY AT BEDTIME & AS NEEDED FOR FLARE UPS) 180 tablet 0  . diazepam (VALIUM) 5 MG tablet Take 5 mg by mouth every 8 (eight) hours as needed for muscle spasms.     . diclofenac sodium (VOLTAREN) 1 % GEL Apply 2 g topically 4 (four) times daily as needed (for pain.).     Marland Kitchen fish oil-omega-3 fatty acids 1000 MG capsule Take 1 g by mouth 2 (two) times daily.     Marland Kitchen gabapentin (NEURONTIN) 600 MG tablet Take 1,200 mg by mouth 3 (three) times daily.    Marland Kitchen GARCINIA CAMBOGIA-CHROMIUM PO Take 1 tablet by mouth daily.    Marland Kitchen guaiFENesin (MUCINEX) 600 MG 12 hr tablet Take 600 mg by mouth at bedtime as needed (for cough).    Marland Kitchen ibuprofen (ADVIL,MOTRIN) 200 MG tablet Take 600 mg by mouth 2 (two) times daily.    Marland Kitchen lisinopril (PRINIVIL,ZESTRIL) 5 MG tablet Take 2.5 mg by mouth every morning.     . magnesium hydroxide (MILK OF MAGNESIA) 400 MG/5ML suspension Take 30 mLs by mouth daily as needed for mild constipation.    . Misc Natural Products (GINSENG COMPLEX PO) Take 1 capsule by mouth daily. Reported on 07/13/2015    . Multiple Vitamins-Minerals (MULTIVITAMIN WITH MINERALS) tablet Take 1 tablet by mouth daily.      . NON FORMULARY Apply 1-2 g topically 4 (four) times daily as needed (for pain. (alternate between both compounded topical creams)). LIDOCAINE POWDER/PCCA VANISHING Base Cream/GABAPENTIN/BACLOFEN/IBUPROFEN     . NONFORMULARY OR COMPOUNDED ITEM Apply 1-2 g topically 4 (four) times daily as needed (for neuropathy pain.for pain. (alternate between both compounded topical  creams)). Doxepin 5%, Amantadine 3%, Pentoxifylline 3%, Lamotrigine 2%, Sertraline 3%, Gabapentin 6% PCCA Lipoderm Base Cream    . omeprazole (PRILOSEC) 40 MG capsule Take 40 mg by mouth daily.    Marland Kitchen OVER THE COUNTER MEDICATION Take 1 scoop by mouth daily as needed (for nutritional health). Over the counter protein supplement     . Oxycodone HCl 10 MG TABS Take 10-20 mg by mouth every 6 (six) hours as needed. For pain.  0  . RAPAFLO 8 MG CAPS capsule Take 8 mg by mouth every evening.   3  . sodium chloride (OCEAN) 0.65 % SOLN nasal spray Place 1 spray into both nostrils at bedtime as needed for congestion.     Marland Kitchen testosterone cypionate (DEPOTESTOTERONE CYPIONATE) 200 MG/ML injection Inject 200 mg into the muscle every 14 (  fourteen) days.     . Turmeric 500 MG CAPS Take 500 mg by mouth daily.    Marland Kitchen venlafaxine (EFFEXOR-XR) 150 MG 24 hr capsule Take 150 mg by mouth every morning.     Marland Kitchen VITAMIN E COMPLEX PO Take 1 capsule by mouth daily.     Marland Kitchen zolpidem (AMBIEN CR) 12.5 MG CR tablet TAKE 1 TABLET BY MOUTH AT BEDTIME (Patient taking differently: TAKE 1 TABLET BY MOUTH AT BEDTIME AS NEEDED FOR SLEEP.) 30 tablet 5  . fluticasone (FLONASE) 50 MCG/ACT nasal spray Place 1 spray into both nostrils daily as needed. For congestion    . predniSONE (DELTASONE) 5 MG tablet 4 tabs po q AM x2d, 3 tabs po q AM x2d, 2 tabs po q AM x 2d,1 tab po q AM x 2d (Patient not taking: Reported on 08/28/2016) 20 tablet 0    No results found for this or any previous visit (from the past 48 hour(s)). No results found.  Pertinent items noted in HPI and remainder of comprehensive ROS otherwise negative.  Blood pressure 127/80, pulse 64, temperature 98.1 F (36.7 C), temperature source Oral, resp. rate 20, height 5\' 9"  (1.753 m), weight 110.7 kg (244 lb), SpO2 97 %.  Patient is obviously very uncomfortable. He is unable to stand or walk much secondary to pain. Examination in general finds to be awake and alert. He is oriented and  reasonably appropriate. Cranial nerve function is intact bilaterally. Motor examination reveals some slight weakness of hip flexion on the right side otherwise motor strength intact. Sensory examination was decreased sensation to pinprick and light touch into his right anterior groin loosely corresponding to a right-sided L1 versus L2 radicular pattern. Reflexes are hypoactive but symmetric. Gait is not tested secondary to pain. Examination head ears eyes nose and throat center left. Chest and abdomen are benign. Extremities are free from injury deformity. Assessment/Plan Right T12-L1 herniated mucous pulposis with radiculopathy. Plan right T12-L1 microdiscectomy. Risks and benefits been explained. Patient wishes to proceed.  Grettell Ransdell A 09/05/2016, 7:44 AM

## 2016-09-05 NOTE — Anesthesia Postprocedure Evaluation (Signed)
Anesthesia Post Note  Patient: Whit Bruni Mathurin  Procedure(s) Performed: Procedure(s) (LRB): Microdiscectomy - right - T12-L1 (Right)     Patient location during evaluation: PACU Anesthesia Type: General Level of consciousness: awake and alert Pain management: pain level controlled Vital Signs Assessment: post-procedure vital signs reviewed and stable Respiratory status: spontaneous breathing, nonlabored ventilation, respiratory function stable and patient connected to nasal cannula oxygen Cardiovascular status: blood pressure returned to baseline and stable Postop Assessment: no signs of nausea or vomiting Anesthetic complications: no    Last Vitals:  Vitals:   09/05/16 1102 09/05/16 1324  BP: (!) 137/95 127/78  Pulse: (!) 57 66  Resp: 16 16  Temp: 36.6 C (!) 36.4 C    Last Pain:  Vitals:   09/05/16 1329  TempSrc:   PainSc: 6                  Harshika Mago P Lometa Riggin

## 2016-09-05 NOTE — Anesthesia Procedure Notes (Signed)
Procedure Name: Intubation Date/Time: 09/05/2016 8:18 AM Performed by: Shirlyn Goltz Pre-anesthesia Checklist: Patient identified, Emergency Drugs available, Suction available and Patient being monitored Patient Re-evaluated:Patient Re-evaluated prior to inductionOxygen Delivery Method: Circle system utilized Preoxygenation: Pre-oxygenation with 100% oxygen Intubation Type: IV induction Ventilation: Mask ventilation without difficulty and Oral airway inserted - appropriate to patient size Laryngoscope Size: Glidescope and 4 Grade View: Grade I Tube type: Oral Tube size: 7.5 mm Number of attempts: 1 Airway Equipment and Method: Rigid stylet and Video-laryngoscopy Placement Confirmation: ETT inserted through vocal cords under direct vision,  positive ETCO2 and breath sounds checked- equal and bilateral Secured at: 21 cm Tube secured with: Tape Dental Injury: Teeth and Oropharynx as per pre-operative assessment

## 2016-09-05 NOTE — Transfer of Care (Signed)
Immediate Anesthesia Transfer of Care Note  Patient: Victor [name]  Procedure(s) Performed: Procedure(s): Microdiscectomy - right - T12-L1 (Right)  Patient Location: PACU  Anesthesia Type:General  Level of Consciousness: awake, alert , oriented and patient cooperative  Airway & Oxygen Therapy: Patient Spontanous Breathing and Patient connected to nasal cannula oxygen  Post-op Assessment: Report given to RN and Post -op Vital signs reviewed and stable  Post vital signs: Reviewed and stable  Last Vitals:  Vitals:   09/05/16 0627 09/05/16 0945  BP: 127/80   Pulse: 64   Resp: 20 (P) 13  Temp: 36.7 C 36.4 C    Last Pain:  Vitals:   09/05/16 0654  TempSrc:   PainSc: 8       Patients Stated Pain Goal: 3 (96/75/91 [id number])  Complications: No apparent anesthesia complications

## 2016-09-05 NOTE — Discharge Summary (Signed)
Physician Discharge Summary  Patient ID: Victor [name] MRN: [id number] DOB/AGE: 1956/01/29 61 y.o.  Admit date: 09/05/2016 Discharge date: 09/05/2016  Admission Diagnoses:  Discharge Diagnoses:  Active Problems:   HNP (herniated nucleus pulposus), thoracic   Discharged Condition: good  Hospital Course: Patient Hospital where he underwent uncomplicated right-sided N36-R4 record discectomy. Postoperative doing reasonably well. Preoperative pain much improved. Ambulating with no difficulty. Patient feels ready for discharge home this evening.  Consults:   Significant Diagnostic Studies:   Treatments:   Discharge Exam: Blood pressure 127/78, pulse 66, temperature (!) 97.5 F (36.4 C), resp. rate 16, height 5\' 9"  (1.753 m), weight 110.7 kg (244 lb), SpO2 98 %. Awake and alert. Oriented and appropriate.  Her function intact. Motor sensory function extremities normal. Wound clean and dry. Chest and abdomen benign.   Disposition: 01-Home or Self Care   Allergies as of 09/05/2016      Reactions   Flexeril [cyclobenzaprine] Other (See Comments)   Makes patient irritable   Tetracyclines & Related Other (See Comments)   " makes me angry"   Niacin And Related Itching      Medication List    TAKE these medications   allopurinol 300 MG tablet Commonly known as:  ZYLOPRIM TAKE 1 AND 1/2 TABLETS BY MOUTH DAILY. What changed:  See the new instructions.   ALPRAZolam 1 MG tablet Commonly known as:  XANAX Take 1 mg by mouth every 8 (eight) hours as needed for anxiety.   atenolol 100 MG tablet Commonly known as:  TENORMIN Take 100 mg by mouth every morning.   Biotin 1000 MCG tablet Take 1,000 mcg by mouth daily.   clobetasol ointment 0.05 % Commonly known as:  TEMOVATE Apply 1 application topically 2 (two) times daily as needed (for skin irritation.).   colchicine 0.6 MG tablet TAKE 1 TABLET BY MOUTH TWICE A DAY What changed:  See the new instructions.   diazepam 5 MG  tablet Commonly known as:  VALIUM Take 5 mg by mouth every 8 (eight) hours as needed for muscle spasms.   diclofenac sodium 1 % Gel Commonly known as:  VOLTAREN Apply 2 g topically 4 (four) times daily as needed (for pain.).   fish oil-omega-3 fatty acids 1000 MG capsule Take 1 g by mouth 2 (two) times daily.   fluticasone 50 MCG/ACT nasal spray Commonly known as:  FLONASE Place 1 spray into both nostrils daily as needed. For congestion   gabapentin 600 MG tablet Commonly known as:  NEURONTIN Take 1,200 mg by mouth 3 (three) times daily.   GARCINIA CAMBOGIA-CHROMIUM PO Take 1 tablet by mouth daily.   GINSENG COMPLEX PO Take 1 capsule by mouth daily. Reported on 07/13/2015   guaiFENesin 600 MG 12 hr tablet Commonly known as:  MUCINEX Take 600 mg by mouth at bedtime as needed (for cough).   ibuprofen 200 MG tablet Commonly known as:  ADVIL,MOTRIN Take 600 mg by mouth 2 (two) times daily.   lisinopril 5 MG tablet Commonly known as:  PRINIVIL,ZESTRIL Take 2.5 mg by mouth every morning.   magnesium hydroxide 400 MG/5ML suspension Commonly known as:  MILK OF MAGNESIA Take 30 mLs by mouth daily as needed for mild constipation.   methocarbamol 500 MG tablet Commonly known as:  ROBAXIN Take 1 tablet (500 mg total) by mouth every 6 (six) hours as needed for muscle spasms.   multivitamin with minerals tablet Take 1 tablet by mouth daily.   NON FORMULARY Apply 1-2 g topically  4 (four) times daily as needed (for pain. (alternate between both compounded topical creams)). LIDOCAINE POWDER/PCCA VANISHING Base Cream/GABAPENTIN/BACLOFEN/IBUPROFEN   NONFORMULARY OR COMPOUNDED ITEM Apply 1-2 g topically 4 (four) times daily as needed (for neuropathy pain.for pain. (alternate between both compounded topical creams)). Doxepin 5%, Amantadine 3%, Pentoxifylline 3%, Lamotrigine 2%, Sertraline 3%, Gabapentin 6% PCCA Lipoderm Base Cream   omeprazole 40 MG capsule Commonly known as:   PRILOSEC Take 40 mg by mouth daily.   OVER THE COUNTER MEDICATION Take 1 scoop by mouth daily as needed (for nutritional health). Over the counter protein supplement   Oxycodone HCl 10 MG Tabs Take 10-20 mg by mouth every 6 (six) hours as needed. For pain.   RAPAFLO 8 MG Caps capsule Generic drug:  silodosin Take 8 mg by mouth every evening.   sodium chloride 0.65 % Soln nasal spray Commonly known as:  OCEAN Place 1 spray into both nostrils at bedtime as needed for congestion.   testosterone cypionate 200 MG/ML injection Commonly known as:  DEPOTESTOSTERONE CYPIONATE Inject 200 mg into the muscle every 14 (fourteen) days.   Turmeric 500 MG Caps Take 500 mg by mouth daily.   venlafaxine XR 150 MG 24 hr capsule Commonly known as:  EFFEXOR-XR Take 150 mg by mouth every morning.   VITAMIN E COMPLEX PO Take 1 capsule by mouth daily.   zolpidem 12.5 MG CR tablet Commonly known as:  AMBIEN CR TAKE 1 TABLET BY MOUTH AT BEDTIME What changed:  See the new instructions.        Signed: Shatira Dobosz A 09/05/2016, 2:10 PM

## 2016-09-05 NOTE — Progress Notes (Signed)
Discharged instructions/education/Rx/AVS given to patient with family at bedside and they all verbalized understanding. Patient ambulating well with supervision. Patient voiding with no difficulty. Patient tolerating meals well. No drainage, no swelling, no redness noted on incision site. Discharged via wheelchair.

## 2016-09-06 ENCOUNTER — Encounter (HOSPITAL_COMMUNITY): Payer: Self-pay | Admitting: Neurosurgery

## 2016-09-26 ENCOUNTER — Telehealth: Payer: Self-pay | Admitting: Pharmacist

## 2016-09-26 NOTE — Telephone Encounter (Signed)
Received a notification from patient's insurance rgarding concern about long term use of nonbenzodiazepine sedative.  Patient is currently prescribed zolpidem CR 12.5 mg by you.  Also received a notification from insurance regarding concurrent use of opioid with zolpidem.  Patient is prescribed oxycodone from Dr. Annette Stable.  I reviewed the controlled substance database.  Of note, patient is also prescribed diazepam from Dr. Annette Stable (last fill on 08/28/16) and alprazolam from Dr. Moreen Fowler (last filled 07/20/16).  I believe this patient is very high risk for CNS/respiratory depression.  Would recommend discontinuation of zolpidem since patient is prescribed benzodiazepine therapy.

## 2016-09-26 NOTE — Telephone Encounter (Signed)
Please call patient to discontinue Ambien as he is on benzodiazepine therapy

## 2016-09-26 NOTE — Telephone Encounter (Signed)
I called patient to review recommendation to discontinue zolpidem.  Patient's wife, [name], answered the phone and reports patient is unavailable at this time.  Reviewed patient's release of information form and [name] is listed on form.  I reviewed recommendation to discontinue zolpidem.  Discussed concern about increased risk of CNS/respiratory depression with benzodiazepines and oxycodone and non-benzodiazepine sedatives.  She reports patient takes benzodiazepines during the day and zolpidem at bedtime without any side effects.  She voiced understanding of recommendation and will pass along the message to the patient.    I spoke to [employer] at [employer].  Prescription refills for zolpidem were cancelled per request of Dr. Estanislado Pandy.    Elisabeth Most, Pharm.D., BCPS, CPP Clinical Pharmacist Pager: [phone] Phone: [phone] 09/26/2016 4:58 PM

## 2016-10-19 ENCOUNTER — Other Ambulatory Visit: Payer: Self-pay | Admitting: Rheumatology

## 2016-10-19 NOTE — Telephone Encounter (Signed)
Last Visit: 04/27/16 Next Visit: 11/10/16 Labs: 08/29/16 WNL  Okay to refill per Dr. Estanislado Pandy

## 2016-11-01 ENCOUNTER — Telehealth: Payer: Self-pay | Admitting: Rheumatology

## 2016-11-01 NOTE — Telephone Encounter (Signed)
Patient states he has been on the Xanax and Valium for over 5 years. Patient states he only takes it as needed. Patient states he was given a call about 1 month ago. Patient states at that time due to a notification from the insurance regarding the medications with the use of Ambien, his Ambien prescription was cancelled. Patient states he has tried Melatonin and it has not helped. Patient would like to know since he only takes the Valium and the Xanax in a PRN basis if you would reconsider sending in the prescription for the Ambien. Patient is not sleeping.

## 2016-11-01 NOTE — Telephone Encounter (Signed)
He may ask for the prescription of Ambien from the same physician who is prescribing Xanax and Valium. These medications are controlled and cannot come from different physicians as there is increased risk of drug interaction.

## 2016-11-01 NOTE — Telephone Encounter (Signed)
Patient calling in reference to his rx for generic Ambien. Patient can not sleep without this medication. Please call to advise.

## 2016-11-02 NOTE — Telephone Encounter (Signed)
Attempted to contact the patient and left message for patient to call the office.  

## 2016-11-04 NOTE — Progress Notes (Deleted)
Office Visit Note  Patient: Victor [name]             Date of Birth: 1956/01/03           MRN: [id number]             PCP: Antony Contras, MD Referring: Antony Contras, MD Visit Date: 11/10/2016 Occupation: @GUAROCC @    Subjective:  No chief complaint on file.   History of Present Illness: Victor [name] is a 61 y.o. male ***   Activities of Daily Living:  Patient reports morning stiffness for *** {minute/hour:19697}.   Patient {ACTIONS;DENIES/REPORTS:21021675::"Denies"} nocturnal pain.  Difficulty dressing/grooming: {ACTIONS;DENIES/REPORTS:21021675::"Denies"} Difficulty climbing stairs: {ACTIONS;DENIES/REPORTS:21021675::"Denies"} Difficulty getting out of chair: {ACTIONS;DENIES/REPORTS:21021675::"Denies"} Difficulty using hands for taps, buttons, cutlery, and/or writing: {ACTIONS;DENIES/REPORTS:21021675::"Denies"}   No Rheumatology ROS completed.   PMFS History:  Patient Active Problem List   Diagnosis Date Noted  . HNP (herniated nucleus pulposus), thoracic 09/05/2016  . Chronic kidney disease (CKD) 04/26/2016  . DJD (degenerative joint disease), cervical 04/26/2016  . Essential hypertension 04/26/2016  . Dyslipidemia 04/26/2016  . Sleep apnea 04/26/2016  . Fibromyalgia 04/18/2016  . Idiopathic chronic gout of multiple sites without tophus 04/18/2016  . Spondylosis of lumbar region without myelopathy or radiculopathy 04/18/2016  . Primary osteoarthritis of both knees 04/18/2016  . Lumbar herniated disc 07/22/2015  . Morbid obesity ([place]) 02/25/2014  . S/P right TKA 02/24/2014  . Heartburn 01/10/2011    Past Medical History:  Diagnosis Date  . Anxiety   . Arthritis    "all over" (04/15/2013)  . Back pain, chronic   . DDD (degenerative disc disease), cervical   . DDD (degenerative disc disease), lumbar   . Fibromyalgia   . GERD (gastroesophageal reflux disease)   . Gout   . High cholesterol   . Hypertension   . Numbness of feet   . Pre-diabetes   .  Sleep apnea    "don't use CPAP" (04/15/2013)    Family History  Problem Relation Age of Onset  . Hypertension Father    Past Surgical History:  Procedure Laterality Date  . ANTERIOR CERVICAL DECOMP/DISCECTOMY FUSION  2000's X3   "had 3 surgeries on my front neck" (04/15/2013)  . APPENDECTOMY  ~ 2010  . CARPAL TUNNEL RELEASE Left 1990's  . FLEXIBLE SIGMOIDOSCOPY  12/21/2011   Procedure: FLEXIBLE SIGMOIDOSCOPY;  Surgeon: Lear Ng, MD;  Location: [facility];  Service: Endoscopy;  Laterality: N/A;  . HAND TENDON SURGERY Left ~ 1978 X2-1980's X3   "had 5 ORs after laceration"  . HOT HEMOSTASIS  12/21/2011   Procedure: HOT HEMOSTASIS (ARGON PLASMA COAGULATION/BICAP);  Surgeon: Lear Ng, MD;  Location: [facility];  Service: Endoscopy;  Laterality: N/A;  . INGUINAL HERNIA REPAIR Bilateral 1970's - 1984  . INGUINAL HERNIA REPAIR Left 04/15/2013  . INGUINAL HERNIA REPAIR Left 04/15/2013   Procedure: LAPAROSCOPIC REPAIR OF INCARCERATED LEFT INGUINAL HERNIA;  Surgeon: Gayland Curry, MD;  Location: [facility];  Service: General;  Laterality: Left;  . INSERTION OF MESH Left 04/15/2013   Procedure: INSERTION OF MESH;  Surgeon: Gayland Curry, MD;  Location: [facility];  Service: General;  Laterality: Left;  . KNEE ARTHROSCOPY Right 1990's  . LAPAROSCOPY N/A 04/15/2013   Procedure: LAPAROSCOPY DIAGNOSTIC;  Surgeon: Gayland Curry, MD;  Location: [facility];  Service: General;  Laterality: N/A;  . LUMBAR LAMINECTOMY/DECOMPRESSION MICRODISCECTOMY Right 07/22/2015   Procedure: Right Lumbar Two-Three Microdiscectomy;  Surgeon: Earnie Larsson, MD;  Location: [facility];  Service: Neurosurgery;  Laterality: Right;  . LUMBAR LAMINECTOMY/DECOMPRESSION MICRODISCECTOMY Right 09/05/2016   Procedure: Microdiscectomy - right - T12-L1;  Surgeon: Earnie Larsson, MD;  Location: [facility];  Service: Neurosurgery;  Laterality: Right;  . NASAL SEPTUM SURGERY Bilateral 2014   "removed polyps & infection"  . POLYPECTOMY   01/10/2011   Procedure: POLYPECTOMY;  Surgeon: Lear Ng, MD;  Location: [facility];  Service: Endoscopy;  Laterality: N/A;  . POSTERIOR LUMBAR FUSION  11/1995   "ray cages" (04/15/2013)  . SHOULDER ARTHROSCOPY Bilateral    "twice each"  . TONSILLECTOMY  1960's  . TOTAL KNEE ARTHROPLASTY Right 02/24/2014   Procedure: TOTAL RIGHT KNEE ARTHROPLASTY;  Surgeon: Mauri Pole, MD;  Location: [facility];  Service: Orthopedics;  Laterality: Right;  Marland Kitchen VASECTOMY  1988  . WISDOM TOOTH EXTRACTION  2000's   Social History   Social History Narrative  . No narrative on file     Objective: Vital Signs: There were no vitals taken for this visit.   Physical Exam   Musculoskeletal Exam: ***  CDAI Exam: No CDAI exam completed.    Investigation: No additional findings.Uric Acid: 6.5 in 12/2015 CBC Latest Ref Rng & Units 08/29/2016 07/14/2015 02/26/2014  WBC 4.0 - 10.5 K/uL 8.0 7.6 11.9(H)  Hemoglobin 13.0 - 17.0 g/dL 16.9 16.4 14.1  Hematocrit 39.0 - 52.0 % 47.6 49.6 43.3  Platelets 150 - 400 K/uL 183 198 190   CMP Latest Ref Rng & Units 08/29/2016 07/14/2015 02/26/2014  Glucose 65 - 99 mg/dL 115(H) 92 130(H)  BUN 6 - 20 mg/dL 10 22(H) 16  Creatinine 0.61 - 1.24 mg/dL 0.80 1.16 0.85  Sodium 135 - 145 mmol/L 133(L) 137 136  Potassium 3.5 - 5.1 mmol/L 4.3 4.6 4.7  Chloride 101 - 111 mmol/L 102 100(L) 100  CO2 22 - 32 mmol/L 21(L) 25 29  Calcium 8.9 - 10.3 mg/dL 9.5 9.8 8.8  Total Protein 6.0 - 8.3 g/dL - - -  Total Bilirubin 0.3 - 1.2 mg/dL - - -  Alkaline Phos 39 - 117 U/L - - -  AST 0 - 37 U/L - - -  ALT 0 - 53 U/L - - -    Imaging: No results found.  Speciality Comments: No specialty comments available.    Procedures:  No procedures performed Allergies: Flexeril [cyclobenzaprine]; Tetracyclines & related; and Niacin and related   Assessment / Plan:     Visit Diagnoses: No diagnosis found.    Orders: No orders of the defined types were placed in this  encounter.  No orders of the defined types were placed in this encounter.   Face-to-face time spent with patient was *** minutes. 50% of time was spent in counseling and coordination of care.  Follow-Up Instructions: No Follow-up on file.   Earnestine Mealing, NT  Note - This record has been created using Editor, commissioning.  Chart creation errors have been sought, but may not always  have been located. Such creation errors do not reflect on  the standard of medical care.

## 2016-11-07 NOTE — Telephone Encounter (Signed)
Attempted to contact the patient and unable to leave a message, mailbos is full.

## 2016-11-09 NOTE — Telephone Encounter (Signed)
Attempted to contact the patient and left message for patient to call the office.  Multiple attempts made to contact the patient with no response will file call.

## 2016-11-10 ENCOUNTER — Ambulatory Visit: Payer: 59 | Admitting: Rheumatology

## 2016-11-15 NOTE — Progress Notes (Signed)
Office Visit Note  Patient: Victor [name]             Date of Birth: Dec 11, 1955           MRN: [id number]             PCP: Antony Contras, MD Referring: Antony Contras, MD Visit Date: 11/29/2016 Occupation: @GUAROCC @    Subjective:  Hip pain   History of Present Illness: Victor [name] is a 61 y.o. male with history of osteoarthritis, disc disease, gout and fibromyalgia syndrome. He states she's been having multiple arthralgias. He reports pain and discomfort in his shoulder joints, right hip joint, bilateral knee joints and feet. He continues to have lower back pain. He had another discectomy in June 2018. Is still have some discomfort from underlying fibromyalgia. He denies any major gout flare but he believes he has mild gout flares.  Activities of Daily Living:  Patient reports morning stiffness for 1  hour. Patient Reports nocturnal pain.  Difficulty dressing/grooming: Reports Difficulty climbing stairs: Reports Difficulty getting out of chair: Reports Difficulty using hands for taps, buttons, cutlery, and/or writing: Denies   Review of Systems  Constitutional: Positive for fatigue. Negative for night sweats and weakness ( ).  HENT: Negative for mouth sores, mouth dryness and nose dryness.   Eyes: Negative for redness and dryness.  Respiratory: Negative for shortness of breath and difficulty breathing.   Cardiovascular: Positive for hypertension. Negative for chest pain, palpitations, irregular heartbeat and swelling in legs/feet.  Gastrointestinal: Negative for constipation and diarrhea.  Endocrine: Negative for increased urination.  Musculoskeletal: Positive for arthralgias, joint pain, myalgias, morning stiffness and myalgias. Negative for joint swelling, muscle weakness and muscle tenderness.  Skin: Negative for color change, rash, hair loss, nodules/bumps, skin tightness, ulcers and sensitivity to sunlight.  Allergic/Immunologic: Negative for susceptible to  infections.  Neurological: Negative for dizziness, fainting, memory loss and night sweats.  Hematological: Negative for swollen glands.  Psychiatric/Behavioral: Positive for sleep disturbance. Negative for depressed mood. The patient is nervous/anxious.     PMFS History:  Patient Active Problem List   Diagnosis Date Noted  . HNP (herniated nucleus pulposus), thoracic 09/05/2016  . Chronic kidney disease (CKD) 04/26/2016  . DJD (degenerative joint disease), cervical 04/26/2016  . Essential hypertension 04/26/2016  . Dyslipidemia 04/26/2016  . Sleep apnea 04/26/2016  . Fibromyalgia 04/18/2016  . Idiopathic chronic gout of multiple sites without tophus 04/18/2016  . Spondylosis of lumbar region without myelopathy or radiculopathy 04/18/2016  . Primary osteoarthritis of both knees 04/18/2016  . Lumbar herniated disc 07/22/2015  . Morbid obesity ([place]) 02/25/2014  . S/P right TKA 02/24/2014  . Heartburn 01/10/2011    Past Medical History:  Diagnosis Date  . Anxiety   . Arthritis    "all over" (04/15/2013)  . Back pain, chronic   . DDD (degenerative disc disease), cervical   . DDD (degenerative disc disease), lumbar   . Fibromyalgia   . GERD (gastroesophageal reflux disease)   . Gout   . High cholesterol   . Hypertension   . Numbness of feet   . Pre-diabetes   . Sleep apnea    "don't use CPAP" (04/15/2013)    Family History  Problem Relation Age of Onset  . Hypertension Father    Past Surgical History:  Procedure Laterality Date  . ANTERIOR CERVICAL DECOMP/DISCECTOMY FUSION  2000's X3   "had 3 surgeries on my front neck" (04/15/2013)  . APPENDECTOMY  ~ 2010  . CARPAL  TUNNEL RELEASE Left 1990's  . FLEXIBLE SIGMOIDOSCOPY  12/21/2011   Procedure: FLEXIBLE SIGMOIDOSCOPY;  Surgeon: Lear Ng, MD;  Location: [facility];  Service: Endoscopy;  Laterality: N/A;  . HAND TENDON SURGERY Left ~ 1978 X2-1980's X3   "had 5 ORs after laceration"  . HOT HEMOSTASIS  12/21/2011    Procedure: HOT HEMOSTASIS (ARGON PLASMA COAGULATION/BICAP);  Surgeon: Lear Ng, MD;  Location: [facility];  Service: Endoscopy;  Laterality: N/A;  . INGUINAL HERNIA REPAIR Bilateral 1970's - 1984  . INGUINAL HERNIA REPAIR Left 04/15/2013  . INGUINAL HERNIA REPAIR Left 04/15/2013   Procedure: LAPAROSCOPIC REPAIR OF INCARCERATED LEFT INGUINAL HERNIA;  Surgeon: Gayland Curry, MD;  Location: [place];  Service: General;  Laterality: Left;  . INSERTION OF MESH Left 04/15/2013   Procedure: INSERTION OF MESH;  Surgeon: Gayland Curry, MD;  Location: [place];  Service: General;  Laterality: Left;  . KNEE ARTHROSCOPY Right 1990's  . LAPAROSCOPY N/A 04/15/2013   Procedure: LAPAROSCOPY DIAGNOSTIC;  Surgeon: Gayland Curry, MD;  Location: [place];  Service: General;  Laterality: N/A;  . LUMBAR LAMINECTOMY/DECOMPRESSION MICRODISCECTOMY Right 07/22/2015   Procedure: Right Lumbar Two-Three Microdiscectomy;  Surgeon: Earnie Larsson, MD;  Location: [place] [facility];  Service: Neurosurgery;  Laterality: Right;  . LUMBAR LAMINECTOMY/DECOMPRESSION MICRODISCECTOMY Right 09/05/2016   Procedure: Microdiscectomy - right - T12-L1;  Surgeon: Earnie Larsson, MD;  Location: [place];  Service: Neurosurgery;  Laterality: Right;  . NASAL SEPTUM SURGERY Bilateral 2014   "removed polyps & infection"  . POLYPECTOMY  01/10/2011   Procedure: POLYPECTOMY;  Surgeon: Lear Ng, MD;  [facility];  Service: Endoscopy;  Laterality: N/A;  . POSTERIOR LUMBAR FUSION  11/1995   "ray cages" (04/15/2013)  . SHOULDER ARTHROSCOPY Bilateral    "twice each"  . TONSILLECTOMY  1960's  . TOTAL KNEE ARTHROPLASTY Right 02/24/2014   Procedure: TOTAL RIGHT KNEE ARTHROPLASTY;  Surgeon: Mauri Pole, MD;  [facility]: [facility];  Service: Orthopedics;  Laterality: Right;  Marland Kitchen VASECTOMY  1988  . WISDOM TOOTH EXTRACTION  2000's   Social History   Social History Narrative  . No narrative on file     Objective: Vital Signs: BP (!) 159/96 (BP  Location: Left Arm, Patient Position: Sitting, Cuff Size: Normal)   Pulse 62   Ht 5\' 9"  (1.753 m)   Wt 238 lb (108 kg)   BMI 35.15 kg/m    Physical Exam  Constitutional: He is oriented to person, place, and time. He appears well-developed and well-nourished.  HENT:  Head: Normocephalic and atraumatic.  Eyes: Pupils are equal, round, and reactive to light. Conjunctivae and EOM are normal.  Neck: Normal range of motion. Neck supple.  Cardiovascular: Normal rate, regular rhythm and normal heart sounds.   Pulmonary/Chest: Effort normal and breath sounds normal.  Abdominal: Soft. Bowel sounds are normal.  Neurological: He is alert and oriented to person, place, and time.  Skin: Skin is warm and dry. Capillary refill takes less than 2 seconds.  Psychiatric: He has a normal mood and affect. His behavior is normal.  Nursing note and vitals reviewed.    Musculoskeletal Exam: C-spine, thoracic, and lumbar spine limited range of motion. He had good range of motion of her shoulder joints with painful. Elbow joints wrist joints with good range of motion. He has DIP PIP thickening with no synovitis. He has right total knee replacement which is doing well. He has some discomfort range of motion of his left knee  joint without any warmth swelling or effusion. He has some generalized hyperalgesia from fibromyalgia.  CDAI Exam: No CDAI exam completed.    Investigation: No additional findings.Uric Acid: 4.0, on 06/13/2016 CBC Latest Ref Rng & Units 08/29/2016 07/14/2015 02/26/2014  WBC 4.0 - 10.5 K/uL 8.0 7.6 11.9(H)  Hemoglobin 13.0 - 17.0 g/dL 16.9 16.4 14.1  Hematocrit 39.0 - 52.0 % 47.6 49.6 43.3  Platelets 150 - 400 K/uL 183 198 190   CMP Latest Ref Rng & Units 08/29/2016 07/14/2015 02/26/2014  Glucose 65 - 99 mg/dL 115(H) 92 130(H)  BUN 6 - 20 mg/dL 10 22(H) 16  Creatinine 0.61 - 1.24 mg/dL 0.80 1.16 0.85  Sodium 135 - 145 mmol/L 133(L) 137 136  Potassium 3.5 - 5.1 mmol/L 4.3 4.6 4.7  Chloride  101 - 111 mmol/L 102 100(L) 100  CO2 22 - 32 mmol/L 21(L) 25 29  Calcium 8.9 - 10.3 mg/dL 9.5 9.8 8.8  Total Protein 6.0 - 8.3 g/dL - - -  Total Bilirubin 0.3 - 1.2 mg/dL - - -  Alkaline Phos 39 - 117 U/L - - -  AST 0 - 37 U/L - - -  ALT 0 - 53 U/L - - -    Imaging: No results found.  Speciality Comments: No specialty comments available.    Procedures:  No procedures performed Allergies: Flexeril [cyclobenzaprine]; Tetracyclines & related; and Niacin and related   Assessment / Plan:     Visit Diagnoses: Idiopathic chronic gout of multiple sites without tophus - Allopurinol 450 mg by mouth daily, colchicine.His last uric acid in April was 4.0. His cough seems to be very well controlled. Patient would like to continue colchicine along with allopurinol as he is concerned that he has mild twinges of pain despite both medications.  Primary osteoarthritis of left knee: He has minimal discomfort.  History of total right knee replacement (TKR): Doing well  Primary osteoarthritis of both hands: Joint protection and muscle strengthening discussed.  Primary osteoarthritis of both feet: His feet are better since she's been using proper fitting shoes.  DJD (degenerative joint disease), cervical: He has limited range of motion with some discomfort.  Spondylosis of lumbar region without myelopathy or radiculopathy - Discectomy May 2017, June 2018. He continues to have severe pain and discomfort in his lower back with limited mobility.  Fibromyalgia: He has generalized pain and discomfort. The gabapentin is helpful.  Morbid obesity ([place]): 8 loss diet and exercise was discussed.  His other medical problems are listed as follows:  Chronic kidney disease, unspecified CKD stage  History of hypertension: His blood pressure was elevated today. I've advised him to monitor that closely.  Dyslipidemia  Other sleep apnea  History of gastroesophageal reflux (GERD)  History of hiatal  hernia  History of hypercholesterolemia  History of insomnia    Orders: No orders of the defined types were placed in this encounter.  No orders of the defined types were placed in this encounter.   Follow-Up Instructions: Return in about 6 months (around 05/30/2017) for Gout, Osteoarthritis, DDD , FMS.   Bo Merino, MD  Note - This record has been created using Editor, commissioning.  Chart creation errors have been sought, but may not always  have been located. Such creation errors do not reflect on  the standard of medical care.

## 2016-11-16 ENCOUNTER — Other Ambulatory Visit: Payer: Self-pay | Admitting: Rheumatology

## 2016-11-16 NOTE — Telephone Encounter (Signed)
Last Visit: 04/27/16 Next Visit: 11/10/16 Labs: 08/29/16 WNL  Okay to refill per Dr. Estanislado Pandy

## 2016-11-29 ENCOUNTER — Ambulatory Visit (INDEPENDENT_AMBULATORY_CARE_PROVIDER_SITE_OTHER): Payer: 59 | Admitting: Rheumatology

## 2016-11-29 ENCOUNTER — Encounter: Payer: Self-pay | Admitting: Rheumatology

## 2016-11-29 VITALS — BP 159/96 | HR 62 | Ht 69.0 in | Wt 238.0 lb

## 2016-11-29 DIAGNOSIS — M797 Fibromyalgia: Secondary | ICD-10-CM

## 2016-11-29 DIAGNOSIS — M19071 Primary osteoarthritis, right ankle and foot: Secondary | ICD-10-CM

## 2016-11-29 DIAGNOSIS — Z87898 Personal history of other specified conditions: Secondary | ICD-10-CM

## 2016-11-29 DIAGNOSIS — M19041 Primary osteoarthritis, right hand: Secondary | ICD-10-CM

## 2016-11-29 DIAGNOSIS — M170 Bilateral primary osteoarthritis of knee: Secondary | ICD-10-CM | POA: Diagnosis not present

## 2016-11-29 DIAGNOSIS — N189 Chronic kidney disease, unspecified: Secondary | ICD-10-CM

## 2016-11-29 DIAGNOSIS — Z8639 Personal history of other endocrine, nutritional and metabolic disease: Secondary | ICD-10-CM

## 2016-11-29 DIAGNOSIS — M5030 Other cervical disc degeneration, unspecified cervical region: Secondary | ICD-10-CM

## 2016-11-29 DIAGNOSIS — Z8679 Personal history of other diseases of the circulatory system: Secondary | ICD-10-CM | POA: Diagnosis not present

## 2016-11-29 DIAGNOSIS — G4739 Other sleep apnea: Secondary | ICD-10-CM

## 2016-11-29 DIAGNOSIS — M19072 Primary osteoarthritis, left ankle and foot: Secondary | ICD-10-CM

## 2016-11-29 DIAGNOSIS — E785 Hyperlipidemia, unspecified: Secondary | ICD-10-CM | POA: Diagnosis not present

## 2016-11-29 DIAGNOSIS — Z8719 Personal history of other diseases of the digestive system: Secondary | ICD-10-CM

## 2016-11-29 DIAGNOSIS — M19042 Primary osteoarthritis, left hand: Secondary | ICD-10-CM

## 2016-11-29 DIAGNOSIS — M47816 Spondylosis without myelopathy or radiculopathy, lumbar region: Secondary | ICD-10-CM

## 2016-11-29 DIAGNOSIS — M1A09X0 Idiopathic chronic gout, multiple sites, without tophus (tophi): Secondary | ICD-10-CM

## 2016-11-29 DIAGNOSIS — Z96651 Presence of right artificial knee joint: Secondary | ICD-10-CM

## 2016-11-29 NOTE — Patient Instructions (Signed)
Please get Uric acid, CBC and CMP with your next labs.

## 2016-12-30 ENCOUNTER — Other Ambulatory Visit: Payer: Self-pay | Admitting: Rheumatology

## 2017-01-01 NOTE — Telephone Encounter (Signed)
Last Visit: 11/29/16 Next Visit: 06/28/17  Okay to refill per Dr. Estanislado Pandy

## 2017-01-14 ENCOUNTER — Other Ambulatory Visit: Payer: Self-pay | Admitting: Rheumatology

## 2017-01-15 NOTE — Telephone Encounter (Signed)
Last Visit: 11/29/16 Next Visit: 06/28/17 Labs: 08/29/16 Stable  Okay to refill per Dr. Estanislado Pandy

## 2017-02-22 ENCOUNTER — Other Ambulatory Visit: Payer: Self-pay | Admitting: Rheumatology

## 2017-02-22 NOTE — Telephone Encounter (Signed)
Last visit: 11/29/2016 Next visit: 06/28/2017 Labs: 08/29/2016 stable  Okay to refill per Dr. Estanislado Pandy.

## 2017-04-11 ENCOUNTER — Other Ambulatory Visit: Payer: Self-pay | Admitting: *Deleted

## 2017-04-11 MED ORDER — ALLOPURINOL 300 MG PO TABS: 450.0000 mg | ORAL_TABLET | Freq: Every day | ORAL | 0 refills | Status: DC

## 2017-04-11 NOTE — Telephone Encounter (Signed)
Refill request received via fax  Last Visit: 11/29/16 Next Visit: 06/28/17 Labs: 08/29/16 Stable   Left message to advise we need updated labs.  Okay to refill 30 day supply per Dr. Estanislado Pandy

## 2017-04-13 ENCOUNTER — Other Ambulatory Visit: Payer: Self-pay | Admitting: Neurosurgery

## 2017-04-13 ENCOUNTER — Other Ambulatory Visit: Payer: Self-pay | Admitting: Rheumatology

## 2017-04-13 DIAGNOSIS — M5124 Other intervertebral disc displacement, thoracic region: Secondary | ICD-10-CM

## 2017-04-25 ENCOUNTER — Ambulatory Visit
Admission: RE | Admit: 2017-04-25 | Discharge: 2017-04-25 | Disposition: A | Discharge status: Home / Self Care | Payer: 59 | Source: Ambulatory Visit | Attending: Neurosurgery | Admitting: Neurosurgery

## 2017-04-25 DIAGNOSIS — M5124 Other intervertebral disc displacement, thoracic region: Secondary | ICD-10-CM

## 2017-04-25 MED ORDER — GADOBENATE DIMEGLUMINE 529 MG/ML IV SOLN: 20.0000 mL | Freq: Once | INTRAVENOUS | Status: DC | PRN

## 2017-05-13 ENCOUNTER — Other Ambulatory Visit: Payer: Self-pay | Admitting: Rheumatology

## 2017-05-22 ENCOUNTER — Other Ambulatory Visit: Payer: Self-pay | Admitting: Rheumatology

## 2017-06-28 ENCOUNTER — Ambulatory Visit: Payer: 59 | Admitting: Rheumatology

## 2017-06-28 ENCOUNTER — Other Ambulatory Visit: Payer: Self-pay | Admitting: Rheumatology

## 2017-06-28 NOTE — Telephone Encounter (Signed)
Last Visit: 11/29/16 Next visit was due 05/2017. Message sent to front to schedule patient   Okay to refill 30 day supply per Dr. Estanislado Pandy

## 2017-07-02 ENCOUNTER — Telehealth: Payer: Self-pay | Admitting: Rheumatology

## 2017-07-02 NOTE — Telephone Encounter (Signed)
-----   Message from Carole Binning, LPN sent at 04/07/7679  8:22 AM EDT ----- Regarding: Please schedule patient follow up visit Please schedule patient follow up visit. Patient was due April 2019. Thanks!

## 2017-07-02 NOTE — Telephone Encounter (Signed)
I spoke with patient's wife [name] who stated she will call back after she reviews his schedule to reschedule appointment.

## 2017-07-04 ENCOUNTER — Other Ambulatory Visit: Payer: Self-pay | Admitting: Neurosurgery

## 2017-07-04 NOTE — Progress Notes (Signed)
Office Visit Note  Patient: Victor [name]             Date of Birth: September 19, 1955           MRN: [id number]             PCP: Antony Contras, MD Referring: Antony Contras, MD Visit Date: 07/05/2017 Occupation: @GUAROCC @    Subjective:  Recent gout flare and right foot.   History of Present Illness: Victor [name] is a 62 y.o. male with history of gout, osteoarthritis, DDD and fibromyalgia.  Patient states that he developed pain and swelling in his right foot about 2 weeks ago.  He states his foot was very swollen that the toenail was almost coming off.  He took some pain medications and started colchicine and symptoms improved.  He has been taking allopurinol on a regular basis.  He does not recall any triggering factors for gout flare.  He continues to have discomfort in his bilateral knee joints bilateral hands and feet secondary to osteoarthritis.  He is also scheduled to have lumbar spine surgery on June 10.  He has a ganglion cyst on his left wrist which needs surgery as well.  He plans to have left total shoulder replacement next year.  Activities of Daily Living:  Patient reports morning stiffness for 1 hour.   Patient Reports nocturnal pain.  Difficulty dressing/grooming: Denies Difficulty climbing stairs: Reports Difficulty getting out of chair: Reports Difficulty using hands for taps, buttons, cutlery, and/or writing: Denies   Review of Systems  Constitutional: Positive for fatigue. Negative for night sweats.  HENT: Negative for mouth sores, mouth dryness and nose dryness.   Eyes: Negative for redness and dryness.  Respiratory: Negative for shortness of breath and difficulty breathing.   Cardiovascular: Positive for hypertension. Negative for chest pain, palpitations, irregular heartbeat and swelling in legs/feet.  Gastrointestinal: Negative for constipation and diarrhea.  Endocrine: Negative for increased urination.  Musculoskeletal: Positive for arthralgias, joint  pain, myalgias, morning stiffness and myalgias. Negative for joint swelling, muscle weakness and muscle tenderness.  Skin: Negative for color change, rash, hair loss, nodules/bumps, skin tightness, ulcers and sensitivity to sunlight.  Allergic/Immunologic: Negative for susceptible to infections.  Neurological: Negative for dizziness, fainting, memory loss, night sweats and weakness ( ).  Hematological: Negative for swollen glands.  Psychiatric/Behavioral: Positive for sleep disturbance. Negative for depressed mood. The patient is nervous/anxious.     PMFS History:  Patient Active Problem List   Diagnosis Date Noted  . HNP (herniated nucleus pulposus), thoracic 09/05/2016  . Chronic kidney disease (CKD) 04/26/2016  . DJD (degenerative joint disease), cervical 04/26/2016  . Essential hypertension 04/26/2016  . Dyslipidemia 04/26/2016  . Sleep apnea 04/26/2016  . Fibromyalgia 04/18/2016  . Idiopathic chronic gout of multiple sites without tophus 04/18/2016  . Spondylosis of lumbar region without myelopathy or radiculopathy 04/18/2016  . Primary osteoarthritis of both knees 04/18/2016  . Lumbar herniated disc 07/22/2015  . Morbid obesity ([place]) 02/25/2014  . S/P right TKA 02/24/2014  . Heartburn 01/10/2011    Past Medical History:  Diagnosis Date  . Anxiety   . Arthritis    "all over" (04/15/2013)  . Back pain, chronic   . DDD (degenerative disc disease), cervical   . DDD (degenerative disc disease), lumbar   . Fibromyalgia   . GERD (gastroesophageal reflux disease)   . Gout   . High cholesterol   . Hypertension   . Numbness of feet   . Pre-diabetes   .  Sleep apnea    "don't use CPAP" (04/15/2013)    Family History  Problem Relation Age of Onset  . Hypertension Father   . Cancer Mother        breast   . Heart Problems Mother   . Alzheimer's disease Mother   . Allergies Daughter   . Interstitial cystitis Daughter    Past Surgical History:  Procedure Laterality Date  .  ANTERIOR CERVICAL DECOMP/DISCECTOMY FUSION  2000's X3   "had 3 surgeries on my front neck" (04/15/2013)  . APPENDECTOMY  ~ 2010  . CARPAL TUNNEL RELEASE Left 1990's  . FLEXIBLE SIGMOIDOSCOPY  12/21/2011   Procedure: FLEXIBLE SIGMOIDOSCOPY;  Surgeon: Lear Ng, MD;  [facility];  Service: Endoscopy;  Laterality: N/A;  . HAND TENDON SURGERY Left ~ 1978 X2-1980's X3   "had 5 ORs after laceration"  . HOT HEMOSTASIS  12/21/2011   Procedure: HOT HEMOSTASIS (ARGON PLASMA COAGULATION/BICAP);  Surgeon: Lear Ng, MD;  Location: [facility];  Service: Endoscopy;  Laterality: N/A;  . INGUINAL HERNIA REPAIR Bilateral 1970's - 1984  . INGUINAL HERNIA REPAIR Left 04/15/2013  . INGUINAL HERNIA REPAIR Left 04/15/2013   Procedure: LAPAROSCOPIC REPAIR OF INCARCERATED LEFT INGUINAL HERNIA;  Surgeon: Gayland Curry, MD;  Location: [place] [facility];  Service: General;  Laterality: Left;  . INSERTION OF MESH Left 04/15/2013   Procedure: INSERTION OF MESH;  Surgeon: Gayland Curry, MD;  Location: [place];  Service: General;  Laterality: Left;  . KNEE ARTHROSCOPY Right 1990's  . LAPAROSCOPY N/A 04/15/2013   Procedure: LAPAROSCOPY DIAGNOSTIC;  Surgeon: Gayland Curry, MD;  Location: [place];  Service: General;  Laterality: N/A;  . LUMBAR LAMINECTOMY/DECOMPRESSION MICRODISCECTOMY Right 07/22/2015   Procedure: Right Lumbar Two-Three Microdiscectomy;  Surgeon: Earnie Larsson, MD;  Location: [place] [facility];  Service: Neurosurgery;  Laterality: Right;  . LUMBAR LAMINECTOMY/DECOMPRESSION MICRODISCECTOMY Right 09/05/2016   Procedure: Microdiscectomy - right - T12-L1;  Surgeon: Earnie Larsson, MD;  Location: [facility];  Service: Neurosurgery;  Laterality: Right;  . NASAL SEPTUM SURGERY Bilateral 2014   "removed polyps & infection"  . POLYPECTOMY  01/10/2011   Procedure: POLYPECTOMY;  Surgeon: Lear Ng, MD;  [facility];  Service: Endoscopy;  Laterality: N/A;  . POSTERIOR LUMBAR FUSION  11/1995   "ray  cages" (04/15/2013)  . SHOULDER ARTHROSCOPY Bilateral    "twice each"  . TONSILLECTOMY  1960's  . TOTAL KNEE ARTHROPLASTY Right 02/24/2014   Procedure: TOTAL RIGHT KNEE ARTHROPLASTY;  Surgeon: Mauri Pole, MD;  Location: [facility];  Service: Orthopedics;  Laterality: Right;  Marland Kitchen VASECTOMY  1988  . WISDOM TOOTH EXTRACTION  2000's   Social History   Social History Narrative  . Not on file     Objective: Vital Signs: BP (!) 141/92 (BP Location: Left Arm, Patient Position: Sitting, Cuff Size: Normal)   Pulse 65   Resp 16   Ht 5\' 8"  (1.727 m)   Wt 232 lb (105.2 kg)   BMI 35.28 kg/m    Physical Exam  Constitutional: He is oriented to person, place, and time. He appears well-developed and well-nourished.  HENT:  Head: Normocephalic and atraumatic.  Eyes: Pupils are equal, round, and reactive to light. Conjunctivae and EOM are normal.  Neck: Normal range of motion. Neck supple.  Cardiovascular: Normal rate, regular rhythm and normal heart sounds.  Pulmonary/Chest: Effort normal and breath sounds normal.  Abdominal: Soft. Bowel sounds are normal.  Neurological: He is alert and oriented to person,  place, and time.  Skin: Skin is warm and dry. Capillary refill takes less than 2 seconds.  Psychiatric: He has a normal mood and affect. His behavior is normal.  Nursing note and vitals reviewed.    Musculoskeletal Exam: C-spine thoracic lumbar spine limited range of motion with discomfort.  Hip painful range of motion of bilateral shoulders.  Elbows and wrist joints with good range of motion.  He has DIP and PIP thickening bilaterally consistent with osteoarthritis.  Hip joints were in good range of motion.  He has right total knee replacement which is doing well.  He has discomfort range of motion of his left knee joint.  Ankle joints are good range of motion.  He has DIP PIP thickening in his feet.  CDAI Exam: No CDAI exam completed.    Investigation: No additional findings. Labs:  06/12/2017  Imaging: No results found.  Speciality Comments: No specialty comments available.    Procedures:  No procedures performed Allergies: Flexeril [cyclobenzaprine]; Tetracyclines & related; and Niacin and related   Assessment / Plan:     Visit Diagnoses: Idiopathic chronic gout of multiple sites without tophus - allopurinol, colchicine. Uric acid: 06/12/2017 5.1.  Patient has recent gout flare.  He states he has been not been very strict with his diet.  Is been also drinking alcohol intermittently.  His uric acid is in desirable range.  Dietary modifications were discussed.  Primary osteoarthritis of both hands-he has chronic pain.  Primary osteoarthritis of left knee-he has been having increased knee joint discomfort.  History of total right knee replacement (TKR)-ongoing pain.  Primary osteoarthritis of both feet-he has osteoarthritic changes in his feet.  DDD (degenerative disc disease), cervical-he has limited range of motion of his C-spine.  Spondylosis of lumbar region without myelopathy or radiculopathy-he is scheduled to have lumbar spine surgery.  Fibromyalgia-he continues to have some generalized pain and discomfort for which she is taking medications.  Chronic kidney disease, unspecified CKD stage  History of insomnia-good sleep hygiene was discussed.  Other medical problems are listed as follows:  History of obesity  History of hypertension  History of sleep apnea  Dyslipidemia  History of hypercholesterolemia  History of hiatal hernia  History of gastroesophageal reflux (GERD)    Orders: No orders of the defined types were placed in this encounter.  No orders of the defined types were placed in this encounter.   Face-to-face time spent with patient was 30 minutes. . 50% of time was spent in counseling and coordination of care.  Follow-Up Instructions: Return for Gout, Osteoarthritis, FMS.   Bo Merino, MD  Note - This record has  been created using Editor, commissioning.  Chart creation errors have been sought, but may not always  have been located. Such creation errors do not reflect on  the standard of medical care.

## 2017-07-05 ENCOUNTER — Ambulatory Visit (INDEPENDENT_AMBULATORY_CARE_PROVIDER_SITE_OTHER): Payer: 59 | Admitting: Rheumatology

## 2017-07-05 ENCOUNTER — Encounter: Payer: Self-pay | Admitting: Rheumatology

## 2017-07-05 VITALS — BP 141/92 | HR 65 | Resp 16 | Ht 68.0 in | Wt 232.0 lb

## 2017-07-05 DIAGNOSIS — Z96651 Presence of right artificial knee joint: Secondary | ICD-10-CM | POA: Diagnosis not present

## 2017-07-05 DIAGNOSIS — M19071 Primary osteoarthritis, right ankle and foot: Secondary | ICD-10-CM

## 2017-07-05 DIAGNOSIS — M1A09X0 Idiopathic chronic gout, multiple sites, without tophus (tophi): Secondary | ICD-10-CM | POA: Diagnosis not present

## 2017-07-05 DIAGNOSIS — Z8639 Personal history of other endocrine, nutritional and metabolic disease: Secondary | ICD-10-CM | POA: Diagnosis not present

## 2017-07-05 DIAGNOSIS — Z8679 Personal history of other diseases of the circulatory system: Secondary | ICD-10-CM

## 2017-07-05 DIAGNOSIS — Z8669 Personal history of other diseases of the nervous system and sense organs: Secondary | ICD-10-CM

## 2017-07-05 DIAGNOSIS — Z8719 Personal history of other diseases of the digestive system: Secondary | ICD-10-CM

## 2017-07-05 DIAGNOSIS — M19072 Primary osteoarthritis, left ankle and foot: Secondary | ICD-10-CM

## 2017-07-05 DIAGNOSIS — Z87898 Personal history of other specified conditions: Secondary | ICD-10-CM | POA: Diagnosis not present

## 2017-07-05 DIAGNOSIS — M19042 Primary osteoarthritis, left hand: Secondary | ICD-10-CM

## 2017-07-05 DIAGNOSIS — M47816 Spondylosis without myelopathy or radiculopathy, lumbar region: Secondary | ICD-10-CM | POA: Diagnosis not present

## 2017-07-05 DIAGNOSIS — M797 Fibromyalgia: Secondary | ICD-10-CM

## 2017-07-05 DIAGNOSIS — M1712 Unilateral primary osteoarthritis, left knee: Secondary | ICD-10-CM

## 2017-07-05 DIAGNOSIS — M19041 Primary osteoarthritis, right hand: Secondary | ICD-10-CM

## 2017-07-05 DIAGNOSIS — M5030 Other cervical disc degeneration, unspecified cervical region: Secondary | ICD-10-CM

## 2017-07-05 DIAGNOSIS — E785 Hyperlipidemia, unspecified: Secondary | ICD-10-CM

## 2017-07-05 DIAGNOSIS — N189 Chronic kidney disease, unspecified: Secondary | ICD-10-CM

## 2017-07-05 MED ORDER — ALLOPURINOL 300 MG PO TABS: 450.0000 mg | ORAL_TABLET | Freq: Every day | ORAL | 1 refills | Status: DC

## 2017-07-05 MED ORDER — COLCHICINE 0.6 MG PO TABS: 0.6000 mg | ORAL_TABLET | Freq: Two times a day (BID) | ORAL | 1 refills | Status: DC

## 2017-07-27 NOTE — Pre-Procedure Instructions (Addendum)
Victor [name]  07/27/2017      [id number] [place],  - [address] [place] [address] Phone: [phone] Fax: [phone]  [place], [place] - [address] [place] [address] Phone: [phone] Fax: [phone]    Your procedure is scheduled on Mon., August 06, 2017 from 10:36AM-12:55PM  Report to [facility] Admitting Entrance "A" at 8:35AM  Call this number if you have problems the morning of surgery:  [phone]   Remember:  No food or drinks after midnight on June 9th   Take these medicines the morning of surgery with A SIP OF WATER by 7:30AM: Allopurinol (ZYLOPRIM), Atenolol (TENORMIN), Gabapentin (NEURONTIN), Omeprazole (PRILOSEC), Venlafaxine (EFFEXOR-XR), and Acetaminophen (TYLENOL). If needed  ALPRAZolam Victor [name]) for anxiety,  Fluticasone (FLONASE) for congestion, and  Oxycodone for pain.  7 days before surgery (6/3), stop taking all Other Aspirin Products, Vitamins, Fish oils, and Herbal medications. Also stop all NSAIDS i.e. Advil, Ibuprofen, Motrin, Aleve, Anaprox, Naproxen, BC and Goody Powders. Including: TURMERIC, GINSENG, Biotin,  and Methylsulfonylmethane (MSM)     Do not wear jewelry.  Do not wear lotions, powders, colognes, or deodorant.  Do not shave 48 hours prior to surgery.  Men may shave face.  Do not bring valuables to the hospital.  [facility] is not responsible for any belongings or valuables.  Contacts, dentures or bridgework may not be worn into surgery.  Leave your suitcase in the car.  After surgery it may be brought to your room.  For patients admitted to the hospital, discharge time will be determined by your treatment team.  Patients discharged the day of surgery will not be allowed to drive home.   Special instructions:  Victor [name]- Preparing For Surgery  Before surgery, you  can play an important role. Because skin is not sterile, your skin needs to be as free of germs as possible. You can reduce the number of germs on your skin by washing with CHG (chlorahexidine gluconate) Soap before surgery.  CHG is an antiseptic cleaner which kills germs and bonds with the skin to continue killing germs even after washing.    Oral Hygiene is also important to reduce your risk of infection.  Remember - BRUSH YOUR TEETH THE MORNING OF SURGERY WITH YOUR REGULAR TOOTHPASTE  Please do not use if you have an allergy to CHG or antibacterial soaps. If your skin becomes reddened/irritated stop using the CHG.  Do not shave (including legs and underarms) for at least 48 hours prior to first CHG shower. It is OK to shave your face.  Please follow these instructions carefully.   1. Shower the NIGHT BEFORE SURGERY and the MORNING OF SURGERY with CHG.   2. If you chose to wash your hair, wash your hair first as usual with your normal shampoo.  3. After you shampoo, rinse your hair and body thoroughly to remove the shampoo.  4. Use CHG as you would any other liquid soap. You can apply CHG directly to the skin and wash gently with a scrungie or a clean washcloth.   5. Apply the CHG Soap to your body ONLY FROM THE NECK DOWN.  Do not use on open wounds or open sores. Avoid contact with your eyes, ears, mouth and genitals (private parts). Wash Face and genitals (private parts)  with your normal soap.  6. Wash thoroughly, paying special  attention to the area where your surgery will be performed.  7. Thoroughly rinse your body with warm water from the neck down.  8. DO NOT shower/wash with your normal soap after using and rinsing off the CHG Soap.  9. Pat yourself dry with a CLEAN TOWEL.  10. Wear CLEAN PAJAMAS to bed the night before surgery, wear comfortable clothes the morning of surgery  11. Place CLEAN SHEETS on your bed the night of your first shower and DO NOT SLEEP WITH PETS.  Day  of Surgery:  Do not apply any deodorants/lotions.  Please wear clean clothes to the hospital/surgery center.   Remember to brush your teeth WITH YOUR REGULAR TOOTHPASTE.  Please read over the following fact sheets that you were given. Pain Booklet, Coughing and Deep Breathing, MRSA Information and Surgical Site Infection Prevention

## 2017-07-30 ENCOUNTER — Encounter (HOSPITAL_COMMUNITY)
Admission: RE | Admit: 2017-07-30 | Discharge: 2017-07-30 | Disposition: A | Discharge status: Home / Self Care | Payer: Medicare Other | Source: Ambulatory Visit | Attending: Neurosurgery | Admitting: Neurosurgery

## 2017-07-30 ENCOUNTER — Other Ambulatory Visit: Payer: Self-pay

## 2017-07-30 ENCOUNTER — Encounter (HOSPITAL_COMMUNITY): Payer: Self-pay

## 2017-07-30 DIAGNOSIS — Z01818 Encounter for other preprocedural examination: Secondary | ICD-10-CM | POA: Insufficient documentation

## 2017-07-30 DIAGNOSIS — M47896 Other spondylosis, lumbar region: Secondary | ICD-10-CM | POA: Insufficient documentation

## 2017-07-30 HISTORY — DX: Radiculopathy, lumbar region: M54.16

## 2017-07-30 LAB — CBC WITH DIFFERENTIAL/PLATELET
ABS IMMATURE GRANULOCYTES: 0 10*3/uL (ref 0.0–0.1)
Basophils Absolute: 0 10*3/uL (ref 0.0–0.1)
Basophils Relative: 0 %
EOS PCT: 5 %
Eosinophils Absolute: 0.4 10*3/uL (ref 0.0–0.7)
HCT: 49.9 % (ref 39.0–52.0)
Hemoglobin: 16.9 g/dL (ref 13.0–17.0)
Immature Granulocytes: 0 %
LYMPHS PCT: 21 %
Lymphs Abs: 2 10*3/uL (ref 0.7–4.0)
MCH: 32.1 pg (ref 26.0–34.0)
MCHC: 33.9 g/dL (ref 30.0–36.0)
MCV: 94.7 fL (ref 78.0–100.0)
Monocytes Absolute: 0.8 10*3/uL (ref 0.1–1.0)
Monocytes Relative: 8 %
NEUTROS ABS: 6.1 10*3/uL (ref 1.7–7.7)
Neutrophils Relative %: 66 %
Platelets: 192 10*3/uL (ref 150–400)
RBC: 5.27 MIL/uL (ref 4.22–5.81)
RDW: 13.2 % (ref 11.5–15.5)
WBC: 9.3 10*3/uL (ref 4.0–10.5)

## 2017-07-30 LAB — BASIC METABOLIC PANEL
Anion gap: 9 (ref 5–15)
BUN: 11 mg/dL (ref 6–20)
CALCIUM: 9.3 mg/dL (ref 8.9–10.3)
CHLORIDE: 103 mmol/L (ref 101–111)
CO2: 26 mmol/L (ref 22–32)
CREATININE: 1.08 mg/dL (ref 0.61–1.24)
Glucose, Bld: 113 mg/dL — ABNORMAL HIGH (ref 65–99)
Potassium: 4.2 mmol/L (ref 3.5–5.1)
Sodium: 138 mmol/L (ref 135–145)

## 2017-07-30 LAB — SURGICAL PCR SCREEN
MRSA, PCR: NEGATIVE
STAPHYLOCOCCUS AUREUS: POSITIVE — AB

## 2017-07-30 LAB — TYPE AND SCREEN
ABO/RH(D): A POS
ANTIBODY SCREEN: NEGATIVE

## 2017-07-30 LAB — ABO/RH: ABO/RH(D): A POS

## 2017-07-30 NOTE — Progress Notes (Signed)
Consent verified by Nicole Kindred., from Dr. Marchelle Folks office. She explained the surgery is for Anterior Lateral Interbody Fusion, Left Lumbar 2-3 with Perc Screws.

## 2017-07-30 NOTE — Progress Notes (Signed)
PCP - Dr. Antony Contras  Cardiologist - Denies  Chest x-ray - Denies  EKG - 08/29/16 (E)  Stress Test -  1995- negative  ECHO - Denies  Cardiac Cath - Denies  Sleep Study - Yes CPAP - None- Can not tolerate air flow  LABS- 07/30/17: CBC w/D, BMP  ASA- Denies  Anesthesia- No  Pt denies having chest pain, sob, or fever at this time. All instructions explained to the pt, with a verbal understanding of the material. Pt agrees to go over the instructions while at home for a better understanding. The opportunity to ask questions was provided.

## 2017-08-06 ENCOUNTER — Inpatient Hospital Stay (HOSPITAL_COMMUNITY): Payer: Medicare Other | Admitting: Certified Registered"

## 2017-08-06 ENCOUNTER — Inpatient Hospital Stay (HOSPITAL_COMMUNITY): Payer: Medicare Other

## 2017-08-06 ENCOUNTER — Encounter (HOSPITAL_COMMUNITY)
Admission: RE | Disposition: A | Discharge status: Home / Self Care | Payer: Self-pay | Source: Home / Self Care | Attending: Neurosurgery

## 2017-08-06 ENCOUNTER — Inpatient Hospital Stay (HOSPITAL_COMMUNITY)
Admission: RE | Admit: 2017-08-06 | Discharge: 2017-08-08 | DRG: 460 | Disposition: A | Discharge status: Home / Self Care | Payer: Medicare Other | Attending: Neurosurgery | Admitting: Neurosurgery

## 2017-08-06 DIAGNOSIS — Z419 Encounter for procedure for purposes other than remedying health state, unspecified: Secondary | ICD-10-CM

## 2017-08-06 DIAGNOSIS — G4730 Sleep apnea, unspecified: Secondary | ICD-10-CM | POA: Diagnosis present

## 2017-08-06 DIAGNOSIS — Z791 Long term (current) use of non-steroidal anti-inflammatories (NSAID): Secondary | ICD-10-CM

## 2017-08-06 DIAGNOSIS — M1990 Unspecified osteoarthritis, unspecified site: Secondary | ICD-10-CM | POA: Diagnosis present

## 2017-08-06 DIAGNOSIS — Z8249 Family history of ischemic heart disease and other diseases of the circulatory system: Secondary | ICD-10-CM

## 2017-08-06 DIAGNOSIS — K219 Gastro-esophageal reflux disease without esophagitis: Secondary | ICD-10-CM | POA: Diagnosis present

## 2017-08-06 DIAGNOSIS — M797 Fibromyalgia: Secondary | ICD-10-CM | POA: Diagnosis present

## 2017-08-06 DIAGNOSIS — F419 Anxiety disorder, unspecified: Secondary | ICD-10-CM | POA: Diagnosis present

## 2017-08-06 DIAGNOSIS — Z888 Allergy status to other drugs, medicaments and biological substances status: Secondary | ICD-10-CM

## 2017-08-06 DIAGNOSIS — G8929 Other chronic pain: Secondary | ICD-10-CM | POA: Diagnosis present

## 2017-08-06 DIAGNOSIS — E7800 Pure hypercholesterolemia, unspecified: Secondary | ICD-10-CM | POA: Diagnosis present

## 2017-08-06 DIAGNOSIS — M48061 Spinal stenosis, lumbar region without neurogenic claudication: Secondary | ICD-10-CM | POA: Diagnosis present

## 2017-08-06 DIAGNOSIS — Z79899 Other long term (current) drug therapy: Secondary | ICD-10-CM | POA: Diagnosis not present

## 2017-08-06 DIAGNOSIS — Z881 Allergy status to other antibiotic agents status: Secondary | ICD-10-CM | POA: Diagnosis not present

## 2017-08-06 DIAGNOSIS — Z79890 Hormone replacement therapy (postmenopausal): Secondary | ICD-10-CM

## 2017-08-06 DIAGNOSIS — M5416 Radiculopathy, lumbar region: Secondary | ICD-10-CM | POA: Diagnosis present

## 2017-08-06 DIAGNOSIS — I10 Essential (primary) hypertension: Secondary | ICD-10-CM | POA: Diagnosis present

## 2017-08-06 DIAGNOSIS — Z87891 Personal history of nicotine dependence: Secondary | ICD-10-CM | POA: Diagnosis not present

## 2017-08-06 DIAGNOSIS — M109 Gout, unspecified: Secondary | ICD-10-CM | POA: Diagnosis present

## 2017-08-06 DIAGNOSIS — M545 Low back pain: Secondary | ICD-10-CM | POA: Diagnosis present

## 2017-08-06 HISTORY — PX: ANTERIOR LAT LUMBAR FUSION: SHX1168

## 2017-08-06 LAB — GLUCOSE, CAPILLARY
Glucose-Capillary: 111 mg/dL — ABNORMAL HIGH (ref 65–99)
Glucose-Capillary: 114 mg/dL — ABNORMAL HIGH (ref 65–99)
Glucose-Capillary: 197 mg/dL — ABNORMAL HIGH (ref 65–99)

## 2017-08-06 SURGERY — ANTERIOR LATERAL LUMBAR FUSION 1 LEVEL
Anesthesia: General | Site: Spine Lumbar | Laterality: Left

## 2017-08-06 MED ORDER — SUGAMMADEX SODIUM 200 MG/2ML IV SOLN
INTRAVENOUS | Status: AC
  Filled 2017-08-06: qty 2

## 2017-08-06 MED ORDER — FENTANYL CITRATE (PF) 250 MCG/5ML IJ SOLN
INTRAMUSCULAR | Status: AC
  Filled 2017-08-06: qty 5

## 2017-08-06 MED ORDER — HYDROMORPHONE HCL 2 MG/ML IJ SOLN
0.2500 mg | INTRAMUSCULAR | Status: DC | PRN
  Administered 2017-08-06 (×2): 0.5 mg via INTRAVENOUS

## 2017-08-06 MED ORDER — DIAZEPAM 5 MG PO TABS: 5.0000 mg | ORAL_TABLET | Freq: Three times a day (TID) | ORAL | Status: DC | PRN

## 2017-08-06 MED ORDER — ACETAMINOPHEN 325 MG PO TABS: 650.0000 mg | ORAL_TABLET | ORAL | Status: DC | PRN

## 2017-08-06 MED ORDER — HYDROCODONE-ACETAMINOPHEN 10-325 MG PO TABS: 1.0000 | ORAL_TABLET | ORAL | Status: DC | PRN

## 2017-08-06 MED ORDER — OXYCODONE HCL 5 MG PO TABS: 10.0000 mg | ORAL_TABLET | ORAL | Status: DC | PRN

## 2017-08-06 MED ORDER — MENTHOL 3 MG MT LOZG: 1.0000 | LOZENGE | OROMUCOSAL | Status: DC | PRN

## 2017-08-06 MED ORDER — LISINOPRIL 10 MG PO TABS
5.0000 mg | ORAL_TABLET | Freq: Every morning | ORAL | Status: DC
  Administered 2017-08-07 – 2017-08-08 (×2): 5 mg via ORAL
  Filled 2017-08-06 (×2): qty 1

## 2017-08-06 MED ORDER — PROPOFOL 10 MG/ML IV BOLUS
INTRAVENOUS | Status: AC
  Filled 2017-08-06: qty 20

## 2017-08-06 MED ORDER — CHLORHEXIDINE GLUCONATE CLOTH 2 % EX PADS: 6.0000 | MEDICATED_PAD | Freq: Once | CUTANEOUS | Status: DC

## 2017-08-06 MED ORDER — ONDANSETRON HCL 4 MG/2ML IJ SOLN
INTRAMUSCULAR | Status: DC | PRN
  Administered 2017-08-06: 4 mg via INTRAVENOUS

## 2017-08-06 MED ORDER — OMEGA-3 FATTY ACIDS 1000 MG PO CAPS: 1.0000 g | ORAL_CAPSULE | Freq: Two times a day (BID) | ORAL | Status: DC

## 2017-08-06 MED ORDER — SUCCINYLCHOLINE CHLORIDE 20 MG/ML IJ SOLN
INTRAMUSCULAR | Status: DC | PRN
  Administered 2017-08-06: 140 mg via INTRAVENOUS

## 2017-08-06 MED ORDER — FLEET ENEMA 7-19 GM/118ML RE ENEM: 1.0000 | ENEMA | Freq: Once | RECTAL | Status: DC | PRN

## 2017-08-06 MED ORDER — B COMPLEX PO TABS: 1.0000 | ORAL_TABLET | Freq: Every day | ORAL | Status: DC

## 2017-08-06 MED ORDER — GABAPENTIN 300 MG PO CAPS
600.0000 mg | ORAL_CAPSULE | Freq: Three times a day (TID) | ORAL | Status: DC
  Administered 2017-08-06 – 2017-08-08 (×6): 600 mg via ORAL
  Filled 2017-08-06 (×6): qty 2

## 2017-08-06 MED ORDER — EPHEDRINE SULFATE 50 MG/ML IJ SOLN
INTRAMUSCULAR | Status: AC
  Filled 2017-08-06: qty 1

## 2017-08-06 MED ORDER — SODIUM CHLORIDE 0.9% FLUSH
3.0000 mL | Freq: Two times a day (BID) | INTRAVENOUS | Status: DC
  Administered 2017-08-06 – 2017-08-08 (×4): 3 mL via INTRAVENOUS

## 2017-08-06 MED ORDER — FENTANYL CITRATE (PF) 100 MCG/2ML IJ SOLN
INTRAMUSCULAR | Status: DC | PRN
  Administered 2017-08-06: 100 ug via INTRAVENOUS
  Administered 2017-08-06 (×2): 50 ug via INTRAVENOUS
  Administered 2017-08-06: 100 ug via INTRAVENOUS

## 2017-08-06 MED ORDER — PROPOFOL 10 MG/ML IV BOLUS
INTRAVENOUS | Status: DC | PRN
  Administered 2017-08-06: 30 mg via INTRAVENOUS
  Administered 2017-08-06: 50 mg via INTRAVENOUS

## 2017-08-06 MED ORDER — FLUTICASONE PROPIONATE 50 MCG/ACT NA SUSP: 1.0000 | Freq: Every day | NASAL | Status: DC | PRN

## 2017-08-06 MED ORDER — BACITRACIN 50000 UNITS IM SOLR
INTRAMUSCULAR | Status: DC | PRN
  Administered 2017-08-06: 12:00:00

## 2017-08-06 MED ORDER — LIDOCAINE 2% (20 MG/ML) 5 ML SYRINGE
INTRAMUSCULAR | Status: AC
  Filled 2017-08-06: qty 5

## 2017-08-06 MED ORDER — OXYCODONE HCL 5 MG PO TABS
20.0000 mg | ORAL_TABLET | ORAL | Status: DC | PRN
  Administered 2017-08-06 – 2017-08-08 (×11): 20 mg via ORAL
  Filled 2017-08-06 (×11): qty 4

## 2017-08-06 MED ORDER — SUCCINYLCHOLINE CHLORIDE 200 MG/10ML IV SOSY
PREFILLED_SYRINGE | INTRAVENOUS | Status: AC
  Filled 2017-08-06: qty 10

## 2017-08-06 MED ORDER — MIDAZOLAM HCL 5 MG/5ML IJ SOLN
INTRAMUSCULAR | Status: DC | PRN
  Administered 2017-08-06: 2 mg via INTRAVENOUS

## 2017-08-06 MED ORDER — BUPIVACAINE HCL (PF) 0.25 % IJ SOLN
INTRAMUSCULAR | Status: DC | PRN
  Administered 2017-08-06: 27 mL

## 2017-08-06 MED ORDER — MSM 1000 MG PO CAPS: 1000.0000 mg | ORAL_CAPSULE | Freq: Every day | ORAL | Status: DC

## 2017-08-06 MED ORDER — ONDANSETRON HCL 4 MG/2ML IJ SOLN
INTRAMUSCULAR | Status: AC
  Filled 2017-08-06: qty 2

## 2017-08-06 MED ORDER — DEXAMETHASONE SODIUM PHOSPHATE 10 MG/ML IJ SOLN
INTRAMUSCULAR | Status: AC
  Filled 2017-08-06: qty 1

## 2017-08-06 MED ORDER — ADULT MULTIVITAMIN W/MINERALS CH
1.0000 | ORAL_TABLET | Freq: Every day | ORAL | Status: DC
  Administered 2017-08-07 – 2017-08-08 (×2): 1 via ORAL
  Filled 2017-08-06 (×2): qty 1

## 2017-08-06 MED ORDER — BUPIVACAINE HCL (PF) 0.25 % IJ SOLN
INTRAMUSCULAR | Status: AC
  Filled 2017-08-06: qty 30

## 2017-08-06 MED ORDER — CEFAZOLIN SODIUM-DEXTROSE 2-4 GM/100ML-% IV SOLN
INTRAVENOUS | Status: AC
  Filled 2017-08-06: qty 100

## 2017-08-06 MED ORDER — GINSENG 100 MG PO CAPS: 600.0000 mg | ORAL_CAPSULE | Freq: Every day | ORAL | Status: DC

## 2017-08-06 MED ORDER — OXYCODONE HCL 10 MG PO TABS: 10.0000 mg | ORAL_TABLET | Freq: Four times a day (QID) | ORAL | Status: DC | PRN

## 2017-08-06 MED ORDER — ALLOPURINOL 300 MG PO TABS
450.0000 mg | ORAL_TABLET | Freq: Every day | ORAL | Status: DC
  Administered 2017-08-07 – 2017-08-08 (×2): 450 mg via ORAL
  Filled 2017-08-06 (×2): qty 1.5

## 2017-08-06 MED ORDER — TURMERIC 1053 MG PO TABS: 1000.0000 mg | ORAL_TABLET | Freq: Every day | ORAL | Status: DC

## 2017-08-06 MED ORDER — CALCIUM CARBONATE 1500 (600 CA) MG PO TABS: 600.0000 mg | ORAL_TABLET | Freq: Every day | ORAL | Status: DC

## 2017-08-06 MED ORDER — LABETALOL HCL 5 MG/ML IV SOLN
INTRAVENOUS | Status: DC | PRN
  Administered 2017-08-06: 5 mg via INTRAVENOUS

## 2017-08-06 MED ORDER — SODIUM CHLORIDE 0.9 % IV SOLN
250.0000 mL | INTRAVENOUS | Status: DC
  Administered 2017-08-06: 250 mL via INTRAVENOUS

## 2017-08-06 MED ORDER — ONDANSETRON HCL 4 MG PO TABS: 4.0000 mg | ORAL_TABLET | Freq: Four times a day (QID) | ORAL | Status: DC | PRN

## 2017-08-06 MED ORDER — GLYCOPYRROLATE 0.2 MG/ML IJ SOLN
INTRAMUSCULAR | Status: DC | PRN
  Administered 2017-08-06: 0.4 mg via INTRAVENOUS

## 2017-08-06 MED ORDER — VENLAFAXINE HCL ER 75 MG PO CP24
150.0000 mg | ORAL_CAPSULE | Freq: Every morning | ORAL | Status: DC
  Administered 2017-08-07 – 2017-08-08 (×2): 150 mg via ORAL
  Filled 2017-08-06 (×2): qty 2

## 2017-08-06 MED ORDER — ONDANSETRON HCL 4 MG/2ML IJ SOLN: 4.0000 mg | Freq: Four times a day (QID) | INTRAMUSCULAR | Status: DC | PRN

## 2017-08-06 MED ORDER — 0.9 % SODIUM CHLORIDE (POUR BTL) OPTIME
TOPICAL | Status: DC | PRN
  Administered 2017-08-06: 1000 mL

## 2017-08-06 MED ORDER — CALCIUM CARBONATE 1250 (500 CA) MG PO TABS
1.0000 | ORAL_TABLET | Freq: Every day | ORAL | Status: DC
  Administered 2017-08-07 – 2017-08-08 (×2): 500 mg via ORAL
  Filled 2017-08-06 (×2): qty 1

## 2017-08-06 MED ORDER — CEFAZOLIN SODIUM-DEXTROSE 1-4 GM/50ML-% IV SOLN
1.0000 g | Freq: Three times a day (TID) | INTRAVENOUS | Status: AC
  Administered 2017-08-06 – 2017-08-07 (×2): 1 g via INTRAVENOUS
  Filled 2017-08-06 (×2): qty 50

## 2017-08-06 MED ORDER — ACETAMINOPHEN 650 MG RE SUPP: 650.0000 mg | RECTAL | Status: DC | PRN

## 2017-08-06 MED ORDER — BISACODYL 10 MG RE SUPP: 10.0000 mg | Freq: Every day | RECTAL | Status: DC | PRN

## 2017-08-06 MED ORDER — LIDOCAINE HCL (CARDIAC) PF 100 MG/5ML IV SOSY
PREFILLED_SYRINGE | INTRAVENOUS | Status: DC | PRN
  Administered 2017-08-06: 100 mg via INTRAVENOUS

## 2017-08-06 MED ORDER — PHENYLEPHRINE 40 MCG/ML (10ML) SYRINGE FOR IV PUSH (FOR BLOOD PRESSURE SUPPORT)
PREFILLED_SYRINGE | INTRAVENOUS | Status: AC
  Filled 2017-08-06: qty 10

## 2017-08-06 MED ORDER — LACTATED RINGERS IV SOLN
INTRAVENOUS | Status: DC
  Administered 2017-08-06 (×2): via INTRAVENOUS
  Administered 2017-08-06: 10 mL/h via INTRAVENOUS

## 2017-08-06 MED ORDER — SODIUM CHLORIDE 0.9% FLUSH: 3.0000 mL | INTRAVENOUS | Status: DC | PRN

## 2017-08-06 MED ORDER — ALPRAZOLAM 0.5 MG PO TABS: 1.0000 mg | ORAL_TABLET | Freq: Every day | ORAL | Status: DC | PRN

## 2017-08-06 MED ORDER — PHENYLEPHRINE HCL 10 MG/ML IJ SOLN
INTRAVENOUS | Status: DC | PRN
  Administered 2017-08-06: 20 ug/min via INTRAVENOUS

## 2017-08-06 MED ORDER — TAMSULOSIN HCL 0.4 MG PO CAPS
0.8000 mg | ORAL_CAPSULE | Freq: Every day | ORAL | Status: DC
  Administered 2017-08-06 – 2017-08-07 (×2): 0.8 mg via ORAL
  Filled 2017-08-06 (×2): qty 2

## 2017-08-06 MED ORDER — MIDAZOLAM HCL 2 MG/2ML IJ SOLN
INTRAMUSCULAR | Status: AC
  Filled 2017-08-06: qty 2

## 2017-08-06 MED ORDER — PROMETHAZINE HCL 25 MG/ML IJ SOLN: 6.2500 mg | INTRAMUSCULAR | Status: DC | PRN

## 2017-08-06 MED ORDER — DEXAMETHASONE SODIUM PHOSPHATE 10 MG/ML IJ SOLN
10.0000 mg | INTRAMUSCULAR | Status: AC
  Administered 2017-08-06: 10 mg via INTRAVENOUS

## 2017-08-06 MED ORDER — CEFAZOLIN SODIUM-DEXTROSE 2-4 GM/100ML-% IV SOLN
2.0000 g | INTRAVENOUS | Status: AC
  Administered 2017-08-06: 2 g via INTRAVENOUS

## 2017-08-06 MED ORDER — OMEGA-3-ACID ETHYL ESTERS 1 G PO CAPS
1.0000 g | ORAL_CAPSULE | Freq: Two times a day (BID) | ORAL | Status: DC
  Administered 2017-08-06 – 2017-08-08 (×4): 1 g via ORAL
  Filled 2017-08-06 (×4): qty 1

## 2017-08-06 MED ORDER — GUAIFENESIN ER 600 MG PO TB12: 600.0000 mg | ORAL_TABLET | Freq: Every evening | ORAL | Status: DC | PRN

## 2017-08-06 MED ORDER — PSYLLIUM 95 % PO PACK
PACK | Freq: Every day | ORAL | Status: DC
  Administered 2017-08-07 – 2017-08-08 (×2): 1 via ORAL
  Filled 2017-08-06 (×2): qty 1

## 2017-08-06 MED ORDER — SALINE SPRAY 0.65 % NA SOLN: 1.0000 | Freq: Every evening | NASAL | Status: DC | PRN

## 2017-08-06 MED ORDER — HYDROMORPHONE HCL 1 MG/ML IJ SOLN
1.0000 mg | INTRAMUSCULAR | Status: DC | PRN
  Administered 2017-08-06 – 2017-08-07 (×3): 1 mg via INTRAVENOUS
  Filled 2017-08-06 (×3): qty 1

## 2017-08-06 MED ORDER — VITAMIN E 180 MG (400 UNIT) PO CAPS: 400.0000 [IU] | ORAL_CAPSULE | Freq: Every day | ORAL | Status: DC

## 2017-08-06 MED ORDER — PANTOPRAZOLE SODIUM 40 MG PO TBEC
40.0000 mg | DELAYED_RELEASE_TABLET | Freq: Every day | ORAL | Status: DC
  Administered 2017-08-07 – 2017-08-08 (×2): 40 mg via ORAL
  Filled 2017-08-06 (×2): qty 1

## 2017-08-06 MED ORDER — HYDROMORPHONE HCL 2 MG/ML IJ SOLN
INTRAMUSCULAR | Status: AC
  Filled 2017-08-06: qty 1

## 2017-08-06 MED ORDER — BIOTIN 5000 MCG PO CAPS: 5000.0000 ug | ORAL_CAPSULE | Freq: Every day | ORAL | Status: DC

## 2017-08-06 MED ORDER — ROCURONIUM BROMIDE 10 MG/ML (PF) SYRINGE
PREFILLED_SYRINGE | INTRAVENOUS | Status: AC
  Filled 2017-08-06: qty 10

## 2017-08-06 MED ORDER — PHENYLEPHRINE HCL 10 MG/ML IJ SOLN
INTRAMUSCULAR | Status: DC | PRN
  Administered 2017-08-06 (×2): 80 ug via INTRAVENOUS

## 2017-08-06 MED ORDER — POLYETHYLENE GLYCOL 3350 17 G PO PACK: 17.0000 g | PACK | Freq: Every day | ORAL | Status: DC | PRN

## 2017-08-06 MED ORDER — ATENOLOL 100 MG PO TABS
100.0000 mg | ORAL_TABLET | Freq: Every morning | ORAL | Status: DC
  Administered 2017-08-07 – 2017-08-08 (×2): 100 mg via ORAL
  Filled 2017-08-06 (×2): qty 1

## 2017-08-06 MED ORDER — PHENOL 1.4 % MT LIQD: 1.0000 | OROMUCOSAL | Status: DC | PRN

## 2017-08-06 MED ORDER — PROPOFOL 500 MG/50ML IV EMUL
INTRAVENOUS | Status: DC | PRN
  Administered 2017-08-06: 80 ug/kg/min via INTRAVENOUS

## 2017-08-06 MED ORDER — DIAZEPAM 5 MG PO TABS
5.0000 mg | ORAL_TABLET | Freq: Four times a day (QID) | ORAL | Status: DC | PRN
  Administered 2017-08-06: 5 mg via ORAL
  Administered 2017-08-06: 10 mg via ORAL
  Administered 2017-08-07 – 2017-08-08 (×4): 5 mg via ORAL
  Filled 2017-08-06 (×5): qty 1
  Filled 2017-08-06: qty 2

## 2017-08-06 MED ORDER — IBUPROFEN 200 MG PO TABS
400.0000 mg | ORAL_TABLET | Freq: Every evening | ORAL | Status: DC
  Administered 2017-08-06: 400 mg via ORAL
  Filled 2017-08-06: qty 2

## 2017-08-06 SURGICAL SUPPLY — 64 items
ADH SKN CLS APL DERMABOND .7 (GAUZE/BANDAGES/DRESSINGS) ×2
APL SKNCLS STERI-STRIP NONHPOA (GAUZE/BANDAGES/DRESSINGS) ×2
BAG DECANTER FOR FLEXI CONT (MISCELLANEOUS) ×8 IMPLANT
BENZOIN TINCTURE PRP APPL 2/3 (GAUZE/BANDAGES/DRESSINGS) ×5 IMPLANT
BLADE CLIPPER SURG (BLADE) IMPLANT
CARTRIDGE OIL MAESTRO DRILL (MISCELLANEOUS) ×2 IMPLANT
CLOSURE WOUND 1/2 X4 (GAUZE/BANDAGES/DRESSINGS) ×1
CONT SPEC 4OZ CLIKSEAL STRL BL (MISCELLANEOUS) ×4 IMPLANT
COVER BACK TABLE 24X17X13 BIG (DRAPES) IMPLANT
COVER BACK TABLE 60X90IN (DRAPES) ×4 IMPLANT
DERMABOND ADVANCED (GAUZE/BANDAGES/DRESSINGS) ×2
DERMABOND ADVANCED .7 DNX12 (GAUZE/BANDAGES/DRESSINGS) ×3 IMPLANT
DIFFUSER DRILL AIR PNEUMATIC (MISCELLANEOUS) ×4 IMPLANT
DRAPE C-ARM 42X72 X-RAY (DRAPES) ×8 IMPLANT
DRAPE C-ARMOR (DRAPES) ×8 IMPLANT
DRAPE LAPAROTOMY 100X72X124 (DRAPES) ×8 IMPLANT
DRAPE POUCH INSTRU U-SHP 10X18 (DRAPES) ×4 IMPLANT
DRAPE SURG 17X23 STRL (DRAPES) ×24 IMPLANT
DRSG OPSITE POSTOP 3X4 (GAUZE/BANDAGES/DRESSINGS) ×9 IMPLANT
ELECT BLADE 4.0 EZ CLEAN MEGAD (MISCELLANEOUS)
ELECT BLADE 6.5 EXT (BLADE) IMPLANT
ELECT REM PT RETURN 9FT ADLT (ELECTROSURGICAL) ×8
ELECTRODE BLDE 4.0 EZ CLN MEGD (MISCELLANEOUS) IMPLANT
ELECTRODE REM PT RTRN 9FT ADLT (ELECTROSURGICAL) ×4 IMPLANT
GAUZE SPONGE 4X4 12PLY STRL (GAUZE/BANDAGES/DRESSINGS) ×4 IMPLANT
GAUZE SPONGE 4X4 16PLY XRAY LF (GAUZE/BANDAGES/DRESSINGS) IMPLANT
GLOVE ECLIPSE 9.0 STRL (GLOVE) ×8 IMPLANT
GLOVE EXAM NITRILE LRG STRL (GLOVE) IMPLANT
GLOVE EXAM NITRILE XL STR (GLOVE) IMPLANT
GLOVE EXAM NITRILE XS STR PU (GLOVE) IMPLANT
GOWN STRL REUS W/ TWL LRG LVL3 (GOWN DISPOSABLE) IMPLANT
GOWN STRL REUS W/ TWL XL LVL3 (GOWN DISPOSABLE) ×6 IMPLANT
GOWN STRL REUS W/TWL 2XL LVL3 (GOWN DISPOSABLE) IMPLANT
GOWN STRL REUS W/TWL LRG LVL3 (GOWN DISPOSABLE)
GOWN STRL REUS W/TWL XL LVL3 (GOWN DISPOSABLE) ×12
GUIDEWIRE NITINOL BEVEL TIP (WIRE) ×6 IMPLANT
KIT BASIN OR (CUSTOM PROCEDURE TRAY) ×8 IMPLANT
KIT DILATOR XLIF 5 (KITS) ×2 IMPLANT
KIT INFUSE X SMALL 1.4CC (Orthopedic Implant) ×3 IMPLANT
KIT SURGICAL ACCESS MAXCESS 4 (KITS) ×3 IMPLANT
KIT TURNOVER KIT B (KITS) ×8 IMPLANT
KIT XLIF (KITS) ×1
MODULE EMG NDL SSEP NVM5 (NEEDLE) IMPLANT
MODULE EMG NEEDLE SSEP NVM5 (NEEDLE) ×4 IMPLANT
MODULE NVM5 NEXT GEN EMG (NEEDLE) ×3 IMPLANT
MODULUS XLW 12X22X55MM 10 (Spine Construct) ×3 IMPLANT
NEEDLE HYPO 22GX1.5 SAFETY (NEEDLE) ×8 IMPLANT
NS IRRIG 1000ML POUR BTL (IV SOLUTION) ×8 IMPLANT
OIL CARTRIDGE MAESTRO DRILL (MISCELLANEOUS) ×4
PACK LAMINECTOMY NEURO (CUSTOM PROCEDURE TRAY) ×8 IMPLANT
PUTTY DBX 5CC (Putty) ×3 IMPLANT
ROD RELINE MAS LORD 5.5X45MM (Rod) ×3 IMPLANT
SCREW LOCK RELINE 5.5 TULIP (Screw) ×6 IMPLANT
SCREW MAS RELINE 6.5X50 POLY (Screw) ×6 IMPLANT
SPONGE LAP 4X18 RFD (DISPOSABLE) IMPLANT
SPONGE SURGIFOAM ABS GEL SZ50 (HEMOSTASIS) IMPLANT
STRIP CLOSURE SKIN 1/2X4 (GAUZE/BANDAGES/DRESSINGS) ×3 IMPLANT
SUT VIC AB 2-0 CT1 18 (SUTURE) ×12 IMPLANT
SUT VIC AB 3-0 SH 8-18 (SUTURE) ×12 IMPLANT
TAPE CLOTH 3X10 TAN LF (GAUZE/BANDAGES/DRESSINGS) ×4 IMPLANT
TOWEL GREEN STERILE (TOWEL DISPOSABLE) ×8 IMPLANT
TOWEL GREEN STERILE FF (TOWEL DISPOSABLE) ×8 IMPLANT
TRAY FOLEY MTR SLVR 16FR STAT (SET/KITS/TRAYS/PACK) ×4 IMPLANT
WATER STERILE IRR 1000ML POUR (IV SOLUTION) ×8 IMPLANT

## 2017-08-06 NOTE — Progress Notes (Signed)
Orthopedic Tech Progress Note Patient Details:  [name] 15-Jul-1955 [id number] Patient was a pre-fit for brace. Patient ID: Victor [name], male   DOB: [birth year], 62 y.o.   MRN: [id number]   Braulio Bosch 08/06/2017, 2:05 PM

## 2017-08-06 NOTE — Anesthesia Procedure Notes (Signed)
Procedure Name: Intubation Date/Time: 08/06/2017 11:07 AM Performed by: Lavell Luster, CRNA Pre-anesthesia Checklist: Patient identified, Emergency Drugs available, Suction available, Patient being monitored and Timeout performed Patient Re-evaluated:Patient Re-evaluated prior to induction Oxygen Delivery Method: Circle system utilized Preoxygenation: Pre-oxygenation with 100% oxygen Induction Type: IV induction Ventilation: Mask ventilation without difficulty Laryngoscope Size: Mac and 4 Grade View: Grade I Tube type: Oral Tube size: 7.5 mm Number of attempts: 1 Airway Equipment and Method: Stylet Placement Confirmation: ETT inserted through vocal cords under direct vision,  positive ETCO2 and breath sounds checked- equal and bilateral Secured at: 23 cm Tube secured with: Tape Dental Injury: Teeth and Oropharynx as per pre-operative assessment

## 2017-08-06 NOTE — Evaluation (Signed)
Physical Therapy Evaluation Patient Details Name: Victor [name] MRN: [id number] DOB: 12-09-1955 Today's Date: 08/06/2017   History of Present Illness  patient is a 62 yo male with history of multiple complex back surgeries who presents s/p LEFT LUMBAR TWO-THREE ANTERIOR LATERAL LUMBAR FUSION WITH PERCUTANEOUS SCREWS   Clinical Impression  Orders received for PT evaluation. Patient demonstrates deficits in functional mobility as indicated below. Will benefit from continued skilled PT to address deficits and maximize function. Will see as indicated and progress as tolerated.      Follow Up Recommendations No PT follow up    Equipment Recommendations  None recommended by PT    Recommendations for Other Services       Precautions / Restrictions Precautions Precautions: Back Precaution Booklet Issued: Yes (comment) Precaution Comments: verbally reviewed and provided handout Required Braces or Orthoses: Spinal Brace Spinal Brace: Lumbar corset Restrictions Weight Bearing Restrictions: No      Mobility  Bed Mobility Overal bed mobility: Needs Assistance Bed Mobility: Rolling;Sidelying to Sit;Sit to Sidelying Rolling: Supervision Sidelying to sit: Supervision     Sit to sidelying: Supervision General bed mobility comments: no physical assist, supervision for safety and sequencing  Transfers Overall transfer level: Needs assistance Equipment used: Rolling walker (2 wheeled) Transfers: Sit to/from Stand Sit to Stand: Supervision         General transfer comment: supervision for safety, no physical assist  Ambulation/Gait Ambulation/Gait assistance: Supervision Ambulation Distance (Feet): 280 Feet Assistive device: Rolling walker (2 wheeled) Gait Pattern/deviations: Step-through pattern;Decreased stride length;Drifts right/left Gait velocity: decreased   General Gait Details: steady with ambulation  Stairs            Wheelchair Mobility    Modified  Rankin (Stroke Patients Only)       Balance Overall balance assessment: Mild deficits observed, not formally tested                                           Pertinent Vitals/Pain Pain Assessment: 0-10 Pain Score: 10-Worst pain ever Pain Location: back Pain Descriptors / Indicators: Grimacing;Guarding;Operative site guarding Pain Intervention(s): Monitored during session;Repositioned    Home Living Family/patient expects to be discharged to:: Private residence Living Arrangements: Spouse/significant other Available Help at Discharge: Family Type of Home: House Home Access: Stairs to enter Entrance Stairs-Rails: Psychiatric nurse of Steps: 3 Home Layout: One level Home Equipment: Shower seat;Bedside commode;Walker - 2 wheels      Prior Function Level of Independence: Independent               Hand Dominance   Dominant Hand: Right    Extremity/Trunk Assessment   Upper Extremity Assessment Upper Extremity Assessment: Overall WFL for tasks assessed    Lower Extremity Assessment Lower Extremity Assessment: Overall WFL for tasks assessed       Communication   Communication: No difficulties  Cognition Arousal/Alertness: Awake/alert Behavior During Therapy: Impulsive Overall Cognitive Status: Within Functional Limits for tasks assessed                                        General Comments      Exercises     Assessment/Plan    PT Assessment Patient needs continued PT services  PT Problem List Decreased balance;Decreased activity tolerance;Decreased mobility;Decreased safety awareness;Decreased knowledge  of precautions;Pain       PT Treatment Interventions DME instruction;Gait training;Stair training;Functional mobility training;Therapeutic activities;Therapeutic exercise;Balance training;Patient/family education    PT Goals (Current goals can be found in the Care Plan section)  Acute Rehab PT  Goals Patient Stated Goal: to not hurt PT Goal Formulation: With patient/family Time For Goal Achievement: 08/20/17 Potential to Achieve Goals: Good    Frequency Min 5X/week   Barriers to discharge        Co-evaluation               AM-PAC PT "6 Clicks" Daily Activity  Outcome Measure Difficulty turning over in bed (including adjusting bedclothes, sheets and blankets)?: A Eder Difficulty moving from lying on back to sitting on the side of the bed? : A Blanchard Difficulty sitting down on and standing up from a chair with arms (e.g., wheelchair, bedside commode, etc,.)?: A Onofrio Help needed moving to and from a bed to chair (including a wheelchair)?: A Wattley Help needed walking in hospital room?: A Glauser Help needed climbing 3-5 steps with a railing? : A Jankowski 6 Click Score: 18    End of Session Equipment Utilized During Treatment: Back brace Activity Tolerance: Patient tolerated treatment well;Patient limited by pain Patient left: in bed;with call bell/phone within reach;with bed alarm set;with SCD's reapplied;with family/visitor present Nurse Communication: Mobility status PT Visit Diagnosis: Difficulty in walking, not elsewhere classified (R26.2)    Time: 4034-7425 PT Time Calculation (min) (ACUTE ONLY): 17 min   Charges:   PT Evaluation $PT Eval Moderate Complexity: 1 Mod     PT G Codes:        Alben Deeds, PT DPT  Board Certified Neurologic Specialist [phone]   Duncan Dull 08/06/2017, 5:41 PM

## 2017-08-06 NOTE — H&P (Signed)
Victor [name] is an 62 y.o. male.   Chief Complaint: Right groin pain HPI: 62 year old male status post L3-4 decompression and fusion done many years in the past.  Patient is recently status post right-sided L2-3 laminotomy and microdiscectomy.  Patient improved postoperatively but has had some worsening recurrent lower back pain with radiation to his right groin and anterior medial thigh.  Symptoms are aggravated by standing or walking.  He is failed conservative management.  Work-up demonstrates evidence of progressive disc space collapse with severe foraminal stenosis on the right at L2-3.  Patient presents now for L2-3 decompression and fusion in hopes of improving of symptoms.  Past Medical History:  Diagnosis Date  . Anxiety   . Arthritis    "all over" (04/15/2013)  . Back pain, chronic   . DDD (degenerative disc disease), cervical   . DDD (degenerative disc disease), lumbar   . Fibromyalgia   . GERD (gastroesophageal reflux disease)   . Gout   . High cholesterol   . Hypertension   . Numbness of feet   . Pre-diabetes   . Radiculopathy of lumbar region   . Sleep apnea    "don't use CPAP" (04/15/2013)    Past Surgical History:  Procedure Laterality Date  . ANTERIOR CERVICAL DECOMP/DISCECTOMY FUSION  2000's X3   "had 3 surgeries on my front neck" (04/15/2013)  . APPENDECTOMY  ~ 2010  . CARPAL TUNNEL RELEASE Left 1990's  . FLEXIBLE SIGMOIDOSCOPY  12/21/2011   Procedure: FLEXIBLE SIGMOIDOSCOPY;  Surgeon: Lear Ng, MD;  Location: [facility];  Service: Endoscopy;  Laterality: N/A;  . HAND TENDON SURGERY Left ~ 1978 X2-1980's X3   "had 5 ORs after laceration"  . HOT HEMOSTASIS  12/21/2011   Procedure: HOT HEMOSTASIS (ARGON PLASMA COAGULATION/BICAP);  Surgeon: Lear Ng, MD;  Location: [facility];  Service: Endoscopy;  Laterality: N/A;  . INGUINAL HERNIA REPAIR Bilateral 1970's - 1984  . INGUINAL HERNIA REPAIR Left 04/15/2013  . INGUINAL HERNIA REPAIR  Left 04/15/2013   Procedure: LAPAROSCOPIC REPAIR OF INCARCERATED LEFT INGUINAL HERNIA;  Surgeon: Gayland Curry, MD;  Location: [facility];  Service: General;  Laterality: Left;  . INSERTION OF MESH Left 04/15/2013   Procedure: INSERTION OF MESH;  Surgeon: Gayland Curry, MD;  Location: [facility];  Service: General;  Laterality: Left;  . KNEE ARTHROSCOPY Right 1990's  . LAPAROSCOPY N/A 04/15/2013   Procedure: LAPAROSCOPY DIAGNOSTIC;  Surgeon: Gayland Curry, MD;  Location: [facility];  Service: General;  Laterality: N/A;  . LUMBAR LAMINECTOMY/DECOMPRESSION MICRODISCECTOMY Right 07/22/2015   Procedure: Right Lumbar Two-Three Microdiscectomy;  Surgeon: Earnie Larsson, MD;  Location: [facility];  Service: Neurosurgery;  Laterality: Right;  . LUMBAR LAMINECTOMY/DECOMPRESSION MICRODISCECTOMY Right 09/05/2016   Procedure: Microdiscectomy - right - T12-L1;  Surgeon: Earnie Larsson, MD;  Location: [facility];  Service: Neurosurgery;  Laterality: Right;  . NASAL SEPTUM SURGERY Bilateral 2014   "removed polyps & infection"  . POLYPECTOMY  01/10/2011   Procedure: POLYPECTOMY;  Surgeon: Lear Ng, MD;  Location: [facility];  Service: Endoscopy;  Laterality: N/A;  . POSTERIOR LUMBAR FUSION  11/1995   "ray cages" (04/15/2013)  . SHOULDER ARTHROSCOPY Bilateral    "twice each"  . TONSILLECTOMY  1960's  . TOTAL KNEE ARTHROPLASTY Right 02/24/2014   Procedure: TOTAL RIGHT KNEE ARTHROPLASTY;  Surgeon: Mauri Pole, MD;  Location: [facility];  Service: Orthopedics;  Laterality: Right;  Marland Kitchen VASECTOMY  1988  . WISDOM TOOTH EXTRACTION  2000's  Family History  Problem Relation Age of Onset  . Hypertension Father   . Cancer Mother        breast   . Heart Problems Mother   . Alzheimer's disease Mother   . Allergies Daughter   . Interstitial cystitis Daughter    Social History:  reports that he quit smoking about 7 years ago. His smoking use included cigarettes and cigars. He has a 1.50 pack-year smoking history. He has never used  smokeless tobacco. He reports that he drinks about 3.0 oz of alcohol per week. He reports that he does not use drugs.  Allergies:  Allergies  Allergen Reactions  . Flexeril [Cyclobenzaprine] Other (See Comments)    Makes patient irritable  . Tetracyclines & Related Other (See Comments)    " makes me angry"  . Niacin And Related Itching    Medications Prior to Admission  Medication Sig Dispense Refill  . acetaminophen (TYLENOL) 500 MG tablet Take 1,000 mg by mouth daily.    Marland Kitchen allopurinol (ZYLOPRIM) 300 MG tablet Take 1.5 tablets (450 mg total) by mouth daily. 135 tablet 1  . ALPRAZolam (XANAX) 1 MG tablet Take 1 mg by mouth daily as needed for anxiety.     Marland Kitchen atenolol (TENORMIN) 100 MG tablet Take 100 mg by mouth every morning.     Marland Kitchen b complex vitamins tablet Take 1 tablet by mouth daily.    . Biotin 5000 MCG CAPS Take 5,000 mcg by mouth daily.    . Calcium Carbonate (CALCIUM 600 PO) Take 600 mg by mouth daily.    . colchicine 0.6 MG tablet Take 1 tablet (0.6 mg total) by mouth 2 (two) times daily. (Patient taking differently: Take 0.6 mg by mouth every evening. May take a second 0.6 mg dose as needed for gout flare) 180 tablet 1  . diazepam (VALIUM) 5 MG tablet Take 5 mg by mouth every 8 (eight) hours as needed for muscle spasms.     . fish oil-omega-3 fatty acids 1000 MG capsule Take 1 g by mouth 2 (two) times daily.     . fluticasone (FLONASE) 50 MCG/ACT nasal spray Place 1 spray into both nostrils daily as needed. For congestion    . gabapentin (NEURONTIN) 300 MG capsule Take 600 mg by mouth 3 (three) times daily.   1  . gabapentin (NEURONTIN) 300 MG capsule TAKE 2 CAPSULES BY MOUTH THREE TIMES A DAY 180 capsule 0  . GINSENG PO Take 600 mg by mouth daily.    Marland Kitchen guaiFENesin (MUCINEX) 600 MG 12 hr tablet Take 600 mg by mouth at bedtime as needed (for cough).    . hydrocortisone cream 1 % Apply 1 application topically daily as needed for itching.    Marland Kitchen ibuprofen (ADVIL,MOTRIN) 200 MG  tablet Take 400 mg by mouth every evening.     Marland Kitchen lisinopril (PRINIVIL,ZESTRIL) 5 MG tablet Take 5 mg by mouth every morning.     . Methylsulfonylmethane (MSM) 1000 MG CAPS Take 1,000 mg by mouth daily.    . Multiple Vitamins-Minerals (MULTIVITAMIN WITH MINERALS) tablet Take 1 tablet by mouth daily.      Marland Kitchen neomycin-bacitracin-polymyxin (NEOSPORIN) ointment Apply 1 application topically as needed for wound care.    . NONFORMULARY OR COMPOUNDED ITEM Apply 1 g topically every evening. Doxepin 5%, Amantadine 3%, Pentoxifylline 3%, Lamotrigine 2%, Sertraline 3%, Gabapentin 6% PCCA Lipoderm Base Cream    . omeprazole (PRILOSEC) 40 MG capsule Take 40 mg by mouth daily.    Marland Kitchen  Oxycodone HCl 10 MG TABS Take 10-20 mg by mouth every 6 (six) hours as needed (pain).   0  . Psyllium (METAMUCIL FIBER PO) Take 1 Scoop by mouth daily.     Marland Kitchen RAPAFLO 8 MG CAPS capsule Take 8 mg by mouth every evening.   3  . sodium chloride (OCEAN) 0.65 % SOLN nasal spray Place 1 spray into both nostrils at bedtime as needed for congestion.     Marland Kitchen testosterone cypionate (DEPOTESTOTERONE CYPIONATE) 200 MG/ML injection Inject 200 mg into the muscle every 14 (fourteen) days.     . TURMERIC PO Take 1,000 mg by mouth daily.    Marland Kitchen venlafaxine (EFFEXOR-XR) 150 MG 24 hr capsule Take 150 mg by mouth every morning.     . vitamin E 400 UNIT capsule Take 400 Units by mouth daily.    . clobetasol ointment (TEMOVATE) 2.44 % Apply 1 application topically 2 (two) times daily as needed (for skin irritation.).     Marland Kitchen zolpidem (AMBIEN CR) 12.5 MG CR tablet TAKE 1 TABLET BY MOUTH AT BEDTIME (Patient not taking: Reported on 11/29/2016) 30 tablet 5    Results for orders placed or performed during the hospital encounter of 08/06/17 (from the past 48 hour(s))  Glucose, capillary     Status: Abnormal   Collection Time: 08/06/17  9:01 AM  Result Value Ref Range   Glucose-Capillary 111 (H) 65 - 99 mg/dL   No results found.  Pertinent items noted in HPI and  remainder of comprehensive ROS otherwise negative.  Blood pressure (!) 161/101, pulse 71, temperature 97.7 F (36.5 C), temperature source Oral, resp. rate 20, SpO2 96 %.  Patient is awake and alert.  He is oriented and appropriate.  Speech is fluent.  Judgment and insight are intact.  Cranial nerve function normal bilateral.  Motor examination of the extremities with weakness in his right hip flexors and mild weakness of his right quadriceps muscle group.  Sensory examination with decreased sensation pinprick light touch in his right L2 and L3 dermatomes.  Deep tendon refluxes hypoactive but symmetric.  No evidence of long track signs.  Gait is antalgic.  Posture is flexed.  Examination of head ears eyes nose and throat is unremarkable.  Chest and abdomen are benign.  Extremities are free from injury deformity. 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  Plan left-sided L2-3 anterior lateral interbody decompression and fusion with interbody cage and morselized allograft and bone morphogenic protein.  Also plan posterior lateral fixation utilizing percutaneous pedicle screw fixation.  Risks and benefits of been explained.  Patient wishes to proceed.  Mallie Mussel A Malak Orantes 08/06/2017, 10:27 AM

## 2017-08-06 NOTE — Anesthesia Preprocedure Evaluation (Addendum)
Anesthesia Evaluation  Patient identified by MRN, date of birth, ID band Patient awake    Reviewed: Allergy & Precautions, NPO status , Patient's Chart, lab work & pertinent test results  Airway Mallampati: II  TM Distance: >3 FB Neck ROM: Full    Dental no notable dental hx.    Pulmonary sleep apnea , former smoker,    Pulmonary exam normal breath sounds clear to auscultation       Cardiovascular hypertension, Pt. on medications and Pt. on home beta blockers Normal cardiovascular exam Rhythm:Regular Rate:Normal     Neuro/Psych negative neurological ROS  negative psych ROS   GI/Hepatic Neg liver ROS, GERD  Medicated,  Endo/Other  negative endocrine ROS  Renal/GU negative Renal ROS  negative genitourinary   Musculoskeletal negative musculoskeletal ROS (+)   Abdominal   Peds negative pediatric ROS (+)  Hematology negative hematology ROS (+)   Anesthesia Other Findings   Reproductive/Obstetrics negative OB ROS                             Anesthesia Physical Anesthesia Plan  ASA: II  Anesthesia Plan: General   Post-op Pain Management:    Induction: Intravenous  PONV Risk Score and Plan: 2 and Ondansetron, Dexamethasone and Treatment may vary due to age or medical condition  Airway Management Planned: Oral ETT  Additional Equipment:   Intra-op Plan:   Post-operative Plan: Extubation in OR  Informed Consent: I have reviewed the patients History and Physical, chart, labs and discussed the procedure including the risks, benefits and alternatives for the proposed anesthesia with the patient or authorized representative who has indicated his/her understanding and acceptance.   Dental advisory given  Plan Discussed with: CRNA and Surgeon  Anesthesia Plan Comments:         Anesthesia Quick Evaluation

## 2017-08-06 NOTE — Brief Op Note (Signed)
08/06/2017  1:15 PM  PATIENT:  Victor [name]  62 y.o. male  PRE-OPERATIVE DIAGNOSIS:  radiculopathy  POST-OPERATIVE DIAGNOSIS:  * No post-op diagnosis entered *  PROCEDURE:  Procedure(s): LEFT LUMBAR TWO-THREE ANTERIOR LATERAL LUMBAR FUSION WITH PERCUTANEOUS SCREWS (Left)  SURGEON:  Surgeon(s) and Role:    * Adyson Vanburen, Mallie Mussel, MD - Primary    * Ashok Pall, MD - Assisting  PHYSICIAN ASSISTANT:   ASSISTANTS:    ANESTHESIA:   general  EBL:  50 mL   BLOOD ADMINISTERED:none  DRAINS: none   LOCAL MEDICATIONS USED:  MARCAINE     SPECIMEN:  No Specimen  DISPOSITION OF SPECIMEN:  N/A  COUNTS:  YES  TOURNIQUET:  * No tourniquets in log *  DICTATION: .Dragon Dictation  PLAN OF CARE: Admit to inpatient   PATIENT DISPOSITION:  PACU - hemodynamically stable.   Delay start of Pharmacological VTE agent (>24hrs) due to surgical blood loss or risk of bleeding: yes

## 2017-08-06 NOTE — Op Note (Signed)
Date of procedure: 08/06/2017  Date of dictation: Same  Service: Neurosurgery  Preoperative diagnosis: Right L2-L3 foraminal stenosis with radiculopathy  Postoperative diagnosis: Same  Procedure Name: Left L2-L3 retroperitoneal anterior lateral interbody decompression and fusion with interbody titanium cage, morselized allograft, and bone morphogenic protein  Nonsegmental posterior percutaneous pedicle screw fixation L2-L3  Surgeon:Keilin Gamboa A.Ayriel Texidor, M.D.  Asst. Surgeon: Christella Noa  Anesthesia: General  Indication: 62 year old male status post remote L3-4 decompression and fusion with reasonably good results.  Patient with recent right L2-3 laminotomy and microdiscectomy for treatment of an adjacent level disc herniation.  Symptoms have progressively worsened.  Patient with evidence of disc space and foraminal collapse on the right side.  Patient is failed conservative management and presents now for L2-3 decompression and fusion in hopes of improving his symptoms.  Operative note: After induction of anesthesia, patient position in the right lateral decubitus position and appropriately padded.  Patient's left flank and lumbar region prepped and draped sterilely.  Localizing C arm images were obtained and incision plan was made.  Real-time intraoperative neural monitoring was used throughout.  A lateral incision was made between his costal border and iliac wing around the mid axillary line.  This carried down sharply to the subcutaneous fat.  A secondary incision was made more posteriorly in the patient's left flank.  Blunt dissection was used to enter his retroperitoneal space.  Peritoneal sac was swept anteriorly to allow for good passage of blunt dilators.  A dilator was then passed through the lateral incision and docked into the lateral aspect of the L2-3 disc space.  Fluoroscopy confirmed good position of the dilator.  Neural monitoring was used and no adjacent nerves were identified.  The  guidewire was then packed into the disc space.  The dilators were sent sequentially enlarged again using monitoring between each step.  Self-retaining retractor was then passed and docked on the lateral disc space.  Neural monitoring confirmed no adjacent nerves.  The retractor was confirmed to be in good position in both AP and lateral views.  Direct stimulation along the planned pass of the shim revealed no nerve adjacent.  The shin was impacted in the place.  The space was then incised.  Discectomy then performed using various instruments.  Contralateral release was performed using Cobb elevators.  The space was then sequentially cleaned and then sized.  A 12 mm x 22 mm x 55 mm implant was found to be most appropriate.  A cage of that size was then packed with DBX putty and a small bone morphogenic protein soaked sponge.  This was then impacted in the place and confirmed to be in good position in both the AP and lateral plane.  Retractor was removed.  There was no evidence of complication with this aspect of the procedure.  Patient's bed was then placed in a neutral position.  Entry sites over the L2 and L3 pedicle was then identified.  Jamshidi needles were then passed through stab incisions and docked into the lateral aspect of the pedicle of L2 and L3.  Then using continuous monitoring the Jamshidi introducer was then passed into the vertebral bodies down the pedicles of L2 and L3 on the left side.  Guidewires were placed.  Each pedicle was then tapped and then 6.5 x 55 mmNuvasive screws were placed  at L2 and L3.  A short segment titanium rod was then placed through the towers.  Locking caps were then attached and tightened.  Final tightening was achieved.  The  locking towers were removed.  Final images reveal good position of the cage and the posterior instrumentation at the proper operative level with improved alignment of the spine.  Wounds were then irrigated fanlike solution.  Wounds then closed in  layers.  Marcaine was infiltrated into the skin edges.  Steri-Strips and sterile dressing were applied.  No apparent complications.  Patient tolerated the procedure well and he returns to the recovery room postop

## 2017-08-06 NOTE — Transfer of Care (Signed)
Immediate Anesthesia Transfer of Care Note  Patient: Victor [name]  Procedure(s) Performed: LEFT LUMBAR TWO-THREE ANTERIOR LATERAL LUMBAR FUSION WITH PERCUTANEOUS SCREWS (Left Spine Lumbar)  Patient Location: PACU  Anesthesia Type:General  Level of Consciousness: awake, alert  and sedated  Airway & Oxygen Therapy: Patient connected to face mask oxygen  Post-op Assessment: Post -op Vital signs reviewed and stable  Post vital signs: stable  Last Vitals:  Vitals Value Taken Time  BP 136/91 08/06/2017  1:31 PM  Temp    Pulse 85 08/06/2017  1:31 PM  Resp 5 08/06/2017  1:31 PM  SpO2 97 % 08/06/2017  1:31 PM  Vitals shown include unvalidated device data.  Last Pain:  Vitals:   08/06/17 0933  TempSrc:   PainSc: 8       Patients Stated Pain Goal: 3 (30/13/14 3888)  Complications: No apparent anesthesia complications

## 2017-08-07 ENCOUNTER — Encounter (HOSPITAL_COMMUNITY): Payer: Self-pay | Admitting: Neurosurgery

## 2017-08-07 MED ORDER — KETOROLAC TROMETHAMINE 30 MG/ML IJ SOLN
30.0000 mg | Freq: Four times a day (QID) | INTRAMUSCULAR | Status: DC
  Administered 2017-08-07 – 2017-08-08 (×5): 30 mg via INTRAVENOUS
  Filled 2017-08-07 (×5): qty 1

## 2017-08-07 NOTE — Progress Notes (Signed)
Physical Therapy Treatment Patient Details Name: Victor [name] MRN: [id number] DOB: 1955-05-23 Today's Date: 08/07/2017    History of Present Illness Pt is a 62 y/o male with a PMH significant for multiple complex back surgeries who presents s/p L2-L3 ALIF with percutaneous screws on 08/06/17.     PT Comments    Pt progressing towards physical therapy goals. Overall he did well during session but was very vocal about frustrations with pain control. Pt refused to attempt stair training this session, so will need to complete prior to pt d/c. Will continue to follow and progress as able per POC.      Follow Up Recommendations  No PT follow up     Equipment Recommendations  None recommended by PT    Recommendations for Other Services       Precautions / Restrictions Precautions Precautions: Back Precaution Booklet Issued: Yes (comment) Precaution Comments: verbally reviewed and provided handout Required Braces or Orthoses: Spinal Brace Spinal Brace: Lumbar corset;Applied in sitting position Restrictions Weight Bearing Restrictions: No    Mobility  Bed Mobility Overal bed mobility: Needs Assistance Bed Mobility: Rolling;Sit to Sidelying Rolling: Supervision       Sit to sidelying: Min guard General bed mobility comments: Close guard to elevate LE's up into bed at end of session. VC's for proper log roll technique.   Transfers Overall transfer level: Needs assistance Equipment used: Rolling walker (2 wheeled) Transfers: Sit to/from Stand Sit to Stand: Supervision         General transfer comment: supervision for safety, no physical assist required  Ambulation/Gait Ambulation/Gait assistance: Supervision Ambulation Distance (Feet): 350 Feet Assistive device: Rolling walker (2 wheeled) Gait Pattern/deviations: Step-through pattern;Decreased stride length;Drifts right/left Gait velocity: decreased Gait velocity interpretation: <1.31 ft/sec, indicative of  household ambulator General Gait Details: Slow but generally steady with ambulation and RW for support. Pt was cued for improved posture but made no corrective changes.    Stairs Stairs: (Pt refused to attempt stairs at this time. )           Wheelchair Mobility    Modified Rankin (Stroke Patients Only)       Balance Overall balance assessment: Mild deficits observed, not formally tested                                          Cognition Arousal/Alertness: Awake/alert Behavior During Therapy: Impulsive Overall Cognitive Status: Within Functional Limits for tasks assessed                                 General Comments: Frustrated with pain control issues and taking it out on staff at times      Exercises      General Comments        Pertinent Vitals/Pain Pain Assessment: 0-10 Pain Score: 8  Pain Location: back Pain Descriptors / Indicators: Grimacing;Guarding;Operative site guarding Pain Intervention(s): Limited activity within patient's tolerance;Monitored during session;Repositioned    Home Living                      Prior Function            PT Goals (current goals can now be found in the care plan section) Acute Rehab PT Goals Patient Stated Goal: Decrease pain PT Goal Formulation: With patient/family Time For  Goal Achievement: 08/20/17 Potential to Achieve Goals: Good Progress towards PT goals: Progressing toward goals    Frequency    Min 5X/week      PT Plan Current plan remains appropriate    Co-evaluation              AM-PAC PT "6 Clicks" Daily Activity  Outcome Measure  Difficulty turning over in bed (including adjusting bedclothes, sheets and blankets)?: A Hessler Difficulty moving from lying on back to sitting on the side of the bed? : A Camarena Difficulty sitting down on and standing up from a chair with arms (e.g., wheelchair, bedside commode, etc,.)?: A Bartolotta Help needed moving  to and from a bed to chair (including a wheelchair)?: A Pop Help needed walking in hospital room?: A Rufer Help needed climbing 3-5 steps with a railing? : A Johnsen 6 Click Score: 18    End of Session Equipment Utilized During Treatment: Back brace Activity Tolerance: Patient limited by pain Patient left: in bed;with call bell/phone within reach;with bed alarm set;with SCD's reapplied;with family/visitor present Nurse Communication: Mobility status PT Visit Diagnosis: Difficulty in walking, not elsewhere classified (R26.2)     Time: 9892-1194 PT Time Calculation (min) (ACUTE ONLY): 42 min  Charges:  $Gait Training: 38-52 mins                    G Codes:       Rolinda Roan, PT, DPT Acute Rehabilitation Services Pager: [place] 08/07/2017, 10:50 AM

## 2017-08-07 NOTE — Anesthesia Postprocedure Evaluation (Signed)
Anesthesia Post Note  Patient: Victor [name]  Procedure(s) Performed: LEFT LUMBAR TWO-THREE ANTERIOR LATERAL LUMBAR FUSION WITH PERCUTANEOUS SCREWS (Left Spine Lumbar)     Patient location during evaluation: PACU Anesthesia Type: General Level of consciousness: awake and alert Pain management: pain level controlled Vital Signs Assessment: post-procedure vital signs reviewed and stable Respiratory status: spontaneous breathing, nonlabored ventilation, respiratory function stable and patient connected to nasal cannula oxygen Cardiovascular status: blood pressure returned to baseline and stable Postop Assessment: no apparent nausea or vomiting Anesthetic complications: no    Last Vitals:  Vitals:   08/07/17 0704 08/07/17 1152  BP: 140/80 120/77  Pulse: 81 72  Resp: 18 18  Temp: 36.8 C 37.2 C  SpO2: 94% 95%    Last Pain:  Vitals:   08/07/17 1152  TempSrc: Oral  PainSc:                  Geanna Divirgilio S

## 2017-08-07 NOTE — Progress Notes (Signed)
Postop day 1.  Patient complains of back pain.  Difficulty mobilizing secondary to pain.  No lower extremity pain.  Preoperative groin and radicular pain improved.  Afebrile.  Vitals are stable.  Awake and alert.  Oriented and appropriate.  Abdomen soft.  Motor and sensory function intact.  Wounds clean and dry.  Overall progressing as would be expected.  Patient is a long-term narcotics have certainly impacted his postoperative pain management.  Continue efforts at mobilization.  Add Toradol today.

## 2017-08-07 NOTE — Evaluation (Signed)
Occupational Therapy Evaluation Patient Details Name: Victor [name] MRN: [id number] DOB: 14-Oct-1955 Today's Date: 08/07/2017    History of Present Illness Pt is a 62 y/o male with a PMH significant for multiple complex back surgeries who presents s/p L2-L3 ALIF with percutaneous screws on 08/06/17.    Clinical Impression   PTA patient was independent with mobility but needed assistance with LB self care, completed limited IADLs.  He currently presents with decreased activity tolerance, lower back pain, decreased safety awareness and impulsivity, and back precautions. Patient demonstrates ability to complete functional transfers using RW with min guard to supervision for safety due to impulsivity, LB ADL with mod to max assist and UB ADL with min to supervision assist.  Patient educated on AE, safety, back precautions, mobility, and DME.  He already uses 3:1 commode over toilet at home and has a shower chair.  Recommend 24/7 assist at discharge, which his family will be able to provide.  Will continue to follow patient while admitted.     Follow Up Recommendations  Follow surgeon's recommendation for DC plan and follow-up therapies;No OT follow up;Supervision/Assistance - 24 hour    Equipment Recommendations  None recommended by OT    Recommendations for Other Services       Precautions / Restrictions Precautions Precautions: Back Precaution Booklet Issued: Yes (comment)(issued in prior PT session) Precaution Comments: able to recall 3/3 precautions without cueing  Required Braces or Orthoses: Spinal Brace Spinal Brace: Lumbar corset;Applied in sitting position Restrictions Weight Bearing Restrictions: No      Mobility Bed Mobility Overal bed mobility: Needs Assistance Bed Mobility: Sidelying to Sit;Sit to Sidelying Rolling: Supervision Sidelying to sit: Supervision     Sit to sidelying: Mod assist General bed mobility comments: requires assist to ascend B feet in to  bed, VCs for proper log roll technique   Transfers Overall transfer level: Needs assistance Equipment used: Rolling walker (2 wheeled) Transfers: Sit to/from Stand Sit to Stand: Supervision;Min guard         General transfer comment: min guard to supervision for safety, no physical assistance; VCs for hand placement during transitions     Balance Overall balance assessment: Mild deficits observed, not formally tested                                         ADL either performed or assessed with clinical judgement   ADL Overall ADL's : Needs assistance/impaired Eating/Feeding: Supervision/ safety   Grooming: Supervision/safety;Cueing for compensatory techniques Grooming Details (indicate cue type and reason): educated on safety and precaution ahdherance  Upper Body Bathing: Supervision/ safety;Sitting   Lower Body Bathing: Maximal assistance;Sit to/from stand;Cueing for back precautions Lower Body Bathing Details (indicate cue type and reason): educated on use of long handled sponge, having assistance as needed and completion seated  Upper Body Dressing : Minimal assistance;Cueing for safety;Cueing for compensatory techniques   Lower Body Dressing: Moderate assistance;Sit to/from stand;Cueing for back precautions;With adaptive equipment Lower Body Dressing Details (indicate cue type and reason): unable to complete figure 4 technique, educated on use of adaptive equipment fair return demo and would benefit from further training  Toilet Transfer: RW;Ambulation;Min guard;Cueing for sequencing(simulated ) Toilet Transfer Details (indicate cue type and reason): cueing for hand placement with transfers, simulated; min guard for safety  Toileting- Clothing Manipulation and Hygiene: Min guard;Cueing for safety;Cueing for back precautions Toileting - Clothing Manipulation Details (  indicate cue type and reason): educated on technqiues to adhere to precautions       Functional mobility during ADLs: Min guard;Supervision/safety;Rolling walker(functional mobility in room and hallway with min guard to supervision ) General ADL Comments: Patient with decreased functional reach and pain in lower back limiting participation in LB ADLs, receptive to AE training but will benefit from further training to ensure understanding.  Requires cueing to adhere to precautions during daily activities, requires cueing to avoid pushing pain to tolerance.      Vision Baseline Vision/History: Wears glasses Wears Glasses: Reading only Patient Visual Report: No change from baseline       Perception     Praxis      Pertinent Vitals/Pain Pain Assessment: 0-10 Pain Score: 8  Pain Location: back Pain Descriptors / Indicators: Grimacing;Guarding;Operative site guarding Pain Intervention(s): Limited activity within patient's tolerance;Monitored during session;Repositioned     Hand Dominance Right   Extremity/Trunk Assessment Upper Extremity Assessment Upper Extremity Assessment: Overall WFL for tasks assessed(patient reports needing B shoulder replacements, demonstrating shoulder flexion limited to 90 degrees)   Lower Extremity Assessment Lower Extremity Assessment: Overall WFL for tasks assessed   Cervical / Trunk Assessment Cervical / Trunk Assessment: Other exceptions Cervical / Trunk Exceptions: s/p spinal surgery   Communication Communication Communication: No difficulties   Cognition Arousal/Alertness: Awake/alert Behavior During Therapy: Impulsive Overall Cognitive Status: Within Functional Limits for tasks assessed                                 General Comments: cooperative, reports frustration but recieved medication prior to session and agreeable to participate    General Comments       Exercises     Shoulder Instructions      Home Living Family/patient expects to be discharged to:: Private residence Living Arrangements:  Spouse/significant other Available Help at Discharge: Family;Available 24 hours/day Type of Home: House Home Access: Stairs to enter [employer] of Steps: 3 Entrance Stairs-Rails: Right;Left Home Layout: One level     Bathroom Shower/Tub: Teacher, early years/pre: Standard     Home Equipment: Shower seat;Bedside commode;Walker - 2 wheels   Additional Comments: uses 3:1 over commode      Prior Functioning/Environment Level of Independence: Needs assistance  Gait / Transfers Assistance Needed: no assistance needed per patient report  ADL's / Homemaking Assistance Needed: patient reports requiring assistance with bathing and LB dressing at times, spouse present for shower transfers, limited IADLs             OT Problem List: Decreased strength;Decreased activity tolerance;Decreased knowledge of use of DME or AE;Pain;Decreased knowledge of precautions;Decreased safety awareness      OT Treatment/Interventions: Self-care/ADL training;Energy conservation;Balance training;DME and/or AE instruction;Therapeutic activities;Patient/family education    OT Goals(Current goals can be found in the care plan section) Acute Rehab OT Goals Patient Stated Goal: Decrease pain and go home  OT Goal Formulation: With patient Time For Goal Achievement: 08/21/17 Potential to Achieve Goals: Good  OT Frequency: Min 2X/week   Barriers to D/C:            Co-evaluation              AM-PAC PT "6 Clicks" Daily Activity     Outcome Measure Help from another person eating meals?: A Bonsall Help from another person taking care of personal grooming?: A Schickling Help from another person toileting, which includes using toliet,  bedpan, or urinal?: A Engelson Help from another person bathing (including washing, rinsing, drying)?: A Lot Help from another person to put on and taking off regular upper body clothing?: A Degroff Help from another person to put on and taking off regular  lower body clothing?: A Lot 6 Click Score: 16   End of Session Equipment Utilized During Treatment: Rolling walker;Back brace Nurse Communication: Mobility status;Precautions  Activity Tolerance: Patient tolerated treatment well;Patient limited by pain Patient left: in bed;with call bell/phone within reach  OT Visit Diagnosis: Other abnormalities of gait and mobility (R26.89);Pain;Muscle weakness (generalized) (M62.81) Pain - Right/Left: Left Pain - part of body: (back)                Time: 0165-5374 OT Time Calculation (min): 35 min Charges:  OT General Charges $OT Visit: 1 Visit OT Evaluation $OT Eval Moderate Complexity: 1 Mod OT Treatments $Therapeutic Activity: 8-22 mins G-Codes:     Delight Stare, OTR/L  Pager [phone]  Delight Stare 08/07/2017, 11:55 AM

## 2017-08-07 NOTE — Progress Notes (Signed)
OT Cancellation Note  Patient Details Name: Victor [name] MRN: [id number] DOB: [birth year]   Cancelled Treatment:    Reason Eval/Treat Not Completed: Pain limiting ability to participate;Fatigue/lethargy limiting ability to participate. Will check back as able, patient requesting therapist to return in 1 hour.   Delight Stare, OTR/L  Pager [phone]  Delight Stare 08/07/2017, 9:46 AM

## 2017-08-08 MED ORDER — DIAZEPAM 5 MG PO TABS: 5.0000 mg | ORAL_TABLET | Freq: Three times a day (TID) | ORAL | 0 refills | Status: DC | PRN

## 2017-08-08 MED ORDER — OXYCODONE HCL 10 MG PO TABS: 10.0000 mg | ORAL_TABLET | ORAL | 0 refills | Status: DC | PRN

## 2017-08-08 NOTE — Progress Notes (Signed)
Patient alert and oriented, mae's well, voiding adequate amount of urine, swallowing without difficulty,c/o pain at time of discharge and medication already given. Patient discharged home with family. Script and discharged instructions given to patient. Patient and family stated understanding of instructions given. Patient has an appointment with Dr. pool

## 2017-08-08 NOTE — Progress Notes (Signed)
Occupational Therapy Treatment Patient Details Name: Victor [name] MRN: [id number] DOB: June 07, 1955 Today's Date: 08/08/2017    History of present illness Pt is a 62 y/o male with a PMH significant for multiple complex back surgeries who presents s/p L2-L3 ALIF with percutaneous screws on 08/06/17.    OT comments  Pt demonstrating some progress toward OT goals. He continues to require significant cueing for adherence to back precautions and to implement safe compensatory strategies. OT continued education concerning use of AE for LB ADL participation and pt is demonstrating improving understanding. He did request that OT step out of room during dressing tasks and wife assisted (charges reflect this). She demonstrates good understanding of precautions and methods to assist pt. He was additionally educated concerning safe tub/shower transfers with RW. Pt more comfortable with side-step method and he was able to complete safely with min assist and cues for back precautions. He reports plan to D/C home today. Will continue to follow while admitted.    Follow Up Recommendations  No OT follow up;Supervision/Assistance - 24 hour    Equipment Recommendations  None recommended by OT    Recommendations for Other Services      Precautions / Restrictions Precautions Precautions: Back Precaution Booklet Issued: Yes (comment)(handout provided by PT) Precaution Comments: able to recall 3/3 precautions without cueing. overall maintained well throughout functional mobility.  Required Braces or Orthoses: Spinal Brace Spinal Brace: Lumbar corset;Applied in sitting position Restrictions Weight Bearing Restrictions: No       Mobility Bed Mobility Overal bed mobility: Needs Assistance Bed Mobility: Rolling;Sidelying to Sit Rolling: Supervision Sidelying to sit: Supervision       General bed mobility comments: Pt sitting up EOB when PT arrived.   Transfers Overall transfer level: Needs  assistance Equipment used: Rolling walker (2 wheeled) Transfers: Sit to/from Stand Sit to Stand: Supervision         General transfer comment: Gross supervision for safety. No assist required however pt required increased time to achieve full stand. VC's for hand placement on seated surface for safety.     Balance Overall balance assessment: Mild deficits observed, not formally tested                                         ADL either performed or assessed with clinical judgement   ADL Overall ADL's : Needs assistance/impaired                     Lower Body Dressing: With caregiver independent assisting;With adaptive equipment Lower Body Dressing Details (indicate cue type and reason): Pt declined dressing while OT present. Continued education on use of AE and able to return demonstration. OT stepped out and wife assisted with dressing per pt request.  Toilet Transfer: RW;Ambulation;Min guard;Cueing for sequencing Toilet Transfer Details (indicate cue type and reason): cues for back precautions and safety Toileting- Clothing Manipulation and Hygiene: Min guard;Cueing for safety;Cueing for back precautions   Tub/ Shower Transfer: Tub transfer;Minimal assistance;Ambulation;Rolling walker;Shower Scientist, research (medical) Details (indicate cue type and reason): OT demonstrated safest technique with RW. Pt reporting his way was more comfortable and demonstrated slightly less safe option requiring assist not to twist. Educated concerning side stepping alternative with BUE support from wall. He is able to complete while adhering to precautions with min assist.  Functional mobility during ADLs: Minimal assistance;Rolling walker;Min guard(min guard basic ambulation, min assist  shower transfer) General ADL Comments: Continued education concerning LB dressing tasks with AE although pt asking OT to step out during dressing tasks with wife assisting. He adhered to precautions  per her report. Pt quick to state that he has had 33 surgeries and knows compensatory strategies but requires cues to adhere to precautions.      Vision       Perception     Praxis      Cognition Arousal/Alertness: Awake/alert Behavior During Therapy: WFL for tasks assessed/performed Overall Cognitive Status: Within Functional Limits for tasks assessed                                 General Comments: pt resistant to participate with therapies        Exercises     Shoulder Instructions       General Comments Pt's wife present during session.     Pertinent Vitals/ Pain       Pain Assessment: 0-10 Pain Score: 5  Pain Location: back Pain Descriptors / Indicators: Grimacing;Guarding;Operative site guarding Pain Intervention(s): Monitored during session  Home Living                                          Prior Functioning/Environment              Frequency  Min 2X/week        Progress Toward Goals  OT Goals(current goals can now be found in the care plan section)  Progress towards OT goals: Progressing toward goals  Acute Rehab OT Goals Patient Stated Goal: Decrease pain and go home today OT Goal Formulation: With patient Time For Goal Achievement: 08/21/17 Potential to Achieve Goals: Good  Plan Discharge plan remains appropriate    Co-evaluation                 AM-PAC PT "6 Clicks" Daily Activity     Outcome Measure   Help from another person eating meals?: None Help from another person taking care of personal grooming?: None Help from another person toileting, which includes using toliet, bedpan, or urinal?: A Soderlund Help from another person bathing (including washing, rinsing, drying)?: A Lot Help from another person to put on and taking off regular upper body clothing?: A Sunde Help from another person to put on and taking off regular lower body clothing?: A Lot 6 Click Score: 18    End of Session  Equipment Utilized During Treatment: Rolling walker;Back brace  OT Visit Diagnosis: Other abnormalities of gait and mobility (R26.89);Pain;Muscle weakness (generalized) (M62.81) Pain - Right/Left: Left Pain - part of body: (back)   Activity Tolerance Patient tolerated treatment well;Patient limited by pain   Patient Left in bed;with call bell/phone within reach   Nurse Communication Mobility status;Precautions        Time: 8372-9021 OT Time Calculation (min): 20 min  Charges: OT General Charges $OT Visit: 1 Visit OT Treatments $Self Care/Home Management : 8-22 mins  Norman Herrlich, MS OTR/L  Pager: [place] A Mehtab Dolberry 08/08/2017, 10:15 AM

## 2017-08-08 NOTE — Discharge Summary (Signed)
Physician Discharge Summary  Patient ID: Victor [name] MRN: [id number] DOB/AGE: 62/13/1957 62 y.o.  Admit date: 08/06/2017 Discharge date: 08/08/2017  Admission Diagnoses:  Discharge Diagnoses:  Active Problems:   Lumbar foraminal stenosis   Discharged Condition: good  Hospital Course: Patient admitted to the hospital where he underwent uncomplicated L2 3X LIF with percutaneous pedicle screws.  Postoperatively doing reasonably well.  Preoperative severe groin pain improved.  Ambulating better.  Ready for discharge home.  Consults:   Significant Diagnostic Studies:   Treatments:   Discharge Exam: Blood pressure 140/87, pulse 65, temperature 99.2 F (37.3 C), temperature source Oral, resp. rate 18, SpO2 94 %. Awake and alert.  Oriented and appropriate.  Motor and sensory function intact.  Wound clean and dry.  Chest and abdomen benign.  Disposition: Discharge disposition: 01-Home or Self Care        Allergies as of 08/08/2017      Reactions   Flexeril [cyclobenzaprine] Other (See Comments)   Makes patient irritable   Tetracyclines & Related Other (See Comments)   " makes me angry"   Niacin And Related Itching      Medication List    TAKE these medications   acetaminophen 500 MG tablet Commonly known as:  TYLENOL Take 1,000 mg by mouth daily.   allopurinol 300 MG tablet Commonly known as:  ZYLOPRIM Take 1.5 tablets (450 mg total) by mouth daily.   ALPRAZolam 1 MG tablet Commonly known as:  XANAX Take 1 mg by mouth daily as needed for anxiety.   atenolol 100 MG tablet Commonly known as:  TENORMIN Take 100 mg by mouth every morning.   b complex vitamins tablet Take 1 tablet by mouth daily.   Biotin 5000 MCG Caps Take 5,000 mcg by mouth daily.   CALCIUM 600 PO Take 600 mg by mouth daily.   clobetasol ointment 0.05 % Commonly known as:  TEMOVATE Apply 1 application topically 2 (two) times daily as needed (for skin irritation.).   colchicine  0.6 MG tablet Take 1 tablet (0.6 mg total) by mouth 2 (two) times daily. What changed:    when to take this  additional instructions   diazepam 5 MG tablet Commonly known as:  VALIUM Take 1 tablet (5 mg total) by mouth every 8 (eight) hours as needed for muscle spasms.   fish oil-omega-3 fatty acids 1000 MG capsule Take 1 g by mouth 2 (two) times daily.   fluticasone 50 MCG/ACT nasal spray Commonly known as:  FLONASE Place 1 spray into both nostrils daily as needed. For congestion   gabapentin 300 MG capsule Commonly known as:  NEURONTIN Take 600 mg by mouth 3 (three) times daily.   gabapentin 300 MG capsule Commonly known as:  NEURONTIN TAKE 2 CAPSULES BY MOUTH THREE TIMES A DAY   GINSENG PO Take 600 mg by mouth daily.   guaiFENesin 600 MG 12 hr tablet Commonly known as:  MUCINEX Take 600 mg by mouth at bedtime as needed (for cough).   hydrocortisone cream 1 % Apply 1 application topically daily as needed for itching.   ibuprofen 200 MG tablet Commonly known as:  ADVIL,MOTRIN Take 400 mg by mouth every evening.   lisinopril 5 MG tablet Commonly known as:  PRINIVIL,ZESTRIL Take 5 mg by mouth every morning.   METAMUCIL FIBER PO Take 1 Scoop by mouth daily.   MSM 1000 MG Caps Take 1,000 mg by mouth daily.   multivitamin with minerals tablet Take 1 tablet by mouth daily.  neomycin-bacitracin-polymyxin ointment Commonly known as:  NEOSPORIN Apply 1 application topically as needed for wound care.   NONFORMULARY OR COMPOUNDED ITEM Apply 1 g topically every evening. Doxepin 5%, Amantadine 3%, Pentoxifylline 3%, Lamotrigine 2%, Sertraline 3%, Gabapentin 6% PCCA Lipoderm Base Cream   omeprazole 40 MG capsule Commonly known as:  PRILOSEC Take 40 mg by mouth daily.   Oxycodone HCl 10 MG Tabs Take 1-2 tablets (10-20 mg total) by mouth every 4 (four) hours as needed (pain). What changed:  when to take this   RAPAFLO 8 MG Caps capsule Generic drug:   silodosin Take 8 mg by mouth every evening.   sodium chloride 0.65 % Soln nasal spray Commonly known as:  OCEAN Place 1 spray into both nostrils at bedtime as needed for congestion.   testosterone cypionate 200 MG/ML injection Commonly known as:  DEPOTESTOSTERONE CYPIONATE Inject 200 mg into the muscle every 14 (fourteen) days.   TURMERIC PO Take 1,000 mg by mouth daily.   venlafaxine XR 150 MG 24 hr capsule Commonly known as:  EFFEXOR-XR Take 150 mg by mouth every morning.   vitamin E 400 UNIT capsule Take 400 Units by mouth daily.            Durable Medical Equipment  (From admission, onward)        Start     Ordered   08/06/17 1450  DME Walker rolling  Once    Question:  Patient needs a walker to treat with the following condition  Answer:  Lumbar foraminal stenosis   08/06/17 1449   08/06/17 1450  DME 3 n 1  Once     08/06/17 1449       Signed: Mallie Mussel A Dorianna Mckiver 08/08/2017, 9:44 AM

## 2017-08-08 NOTE — Discharge Instructions (Signed)

## 2017-08-08 NOTE — Progress Notes (Signed)
Physical Therapy Treatment Patient Details Name: Victor Victor [name] MRN: [id number] DOB: Jun 03, 1955 Today's Date: 08/08/2017    History of Present Illness Pt is a 62 y/o male with a PMH significant for multiple complex back surgeries who presents s/p L2-L3 ALIF with percutaneous screws on 08/06/17.     PT Comments    Pt progressing towards physical therapy goals. Pt continues to be frustrated with aspects of this admission however was pleasant and willing to work with therapy this morning.  Was able to perform transfers and ambulation with gross supervision for safety. Pt was able to negotiate a flight of stairs and was educated on general safety with car transfer, precautions, and activity progression. Will continue to follow.   Follow Up Recommendations  No PT follow up     Equipment Recommendations  None recommended by PT    Recommendations for Other Services       Precautions / Restrictions Precautions Precautions: Back Precaution Booklet Issued: Yes (comment)(handout provided by PT) Precaution Comments: able to recall 3/3 precautions without cueing. overall maintained well throughout functional mobility.  Required Braces or Orthoses: Spinal Brace Spinal Brace: Lumbar corset;Applied in sitting position Restrictions Weight Bearing Restrictions: No    Mobility  Bed Mobility               General bed mobility comments: Pt sitting up EOB when PT arrived.   Transfers Overall transfer level: Needs assistance Equipment used: Rolling walker (2 wheeled) Transfers: Sit to/from Stand Sit to Stand: Supervision         General transfer comment: Gross supervision for safety. No assist required however pt required increased time to achieve Victor [name] stand. VC's for hand placement on seated surface for safety.   Ambulation/Gait Ambulation/Gait assistance: Supervision Ambulation Distance (Feet): 350 Feet Assistive device: Rolling walker (2 wheeled);None Gait  Pattern/deviations: Step-through pattern;Decreased stride length;Drifts right/left Gait velocity: decreased Gait velocity interpretation: 1.31 - 2.62 ft/sec, indicative of limited community ambulator General Gait Details: Slow but steady. Pt initially with RW but progressed to no AD.   Stairs Stairs: Yes Stairs assistance: Supervision Stair Management: One rail Right;Alternating pattern;Forwards Number of Stairs: 10 General stair comments: VC's for sequencing and general safety. Pt was advised to start with step-to pattern but immediately began with alternating step without difficulty.    Wheelchair Mobility    Modified Rankin (Stroke Patients Only)       Balance Overall balance assessment: Mild deficits observed, not formally tested                                          Cognition Arousal/Alertness: Awake/alert Behavior During Therapy: WFL for tasks assessed/performed Overall Cognitive Status: Within Functional Limits for tasks assessed                                        Exercises      General Comments        Pertinent Vitals/Pain Pain Assessment: 0-10 Pain Score: 5  Pain Location: back Pain Descriptors / Indicators: Grimacing;Guarding;Operative site guarding Pain Intervention(s): Monitored during session    Home Living                      Prior Function            PT  Goals (current goals can now be found in the care plan section) Acute Rehab PT Goals Patient Stated Goal: Decrease pain and go home today PT Goal Formulation: With patient/family Time For Goal Achievement: 08/20/17 Potential to Achieve Goals: Good Progress towards PT goals: Progressing toward goals    Frequency    Min 5X/week      PT Plan Current plan remains appropriate    Co-evaluation              AM-PAC PT "6 Clicks" Daily Activity  Outcome Measure  Difficulty turning over in bed (including adjusting bedclothes, sheets  and blankets)?: A Meinders Difficulty moving from lying on back to sitting on the side of the bed? : A Iser Difficulty sitting down on and standing up from a chair with arms (e.g., wheelchair, bedside commode, etc,.)?: A Haws Help needed moving to and from a bed to chair (including a wheelchair)?: A Garzon Help needed walking in hospital room?: A Armel Help needed climbing 3-5 steps with a railing? : A Koke 6 Click Score: 18    End of Session Equipment Utilized During Treatment: Back brace Activity Tolerance: Patient limited by pain Patient left: in bed;with call bell/phone within reach;with bed alarm set;with SCD's reapplied;with family/visitor present Nurse Communication: Mobility status PT Visit Diagnosis: Difficulty in walking, not elsewhere classified (R26.2)     Time: 0830-0900 PT Time Calculation (min) (ACUTE ONLY): 30 min  Charges:  $Gait Training: 23-37 mins                    G Codes:       Victor Victor [name], PT, DPT Acute Rehabilitation Services Pager: [place] 08/08/2017, 1:16 PM

## 2017-08-21 ENCOUNTER — Other Ambulatory Visit: Payer: Self-pay | Admitting: Rheumatology

## 2017-08-21 NOTE — Telephone Encounter (Signed)
Last Visit: 07/05/17 Next Visit: 01/16/18  Okay to refill per Dr. Estanislado Pandy

## 2017-09-28 ENCOUNTER — Telehealth: Payer: Self-pay | Admitting: Rheumatology

## 2017-09-28 MED ORDER — PREDNISONE 5 MG PO TABS: ORAL_TABLET | ORAL | 0 refills | Status: DC

## 2017-09-28 NOTE — Telephone Encounter (Signed)
Okay to send in prednisone taper?

## 2017-09-28 NOTE — Telephone Encounter (Signed)
Okay to give prednisone taper as prescribed earlier

## 2017-09-28 NOTE — Telephone Encounter (Signed)
Prescription has been sent, patient's wife has been notified.

## 2017-09-28 NOTE — Telephone Encounter (Signed)
Patient's wife [name] called stating that Victor [name] had shellfish over the weekend and has felt bad ever since.   She states his Gout is in both feet, left knee, and for the first time in his lower back.  Patient is requesting a prescription of Prednisone to be sent to [facility] on [address] in [place].

## 2018-01-04 NOTE — Progress Notes (Deleted)
Office Visit Note  Patient: Victor [name]             Date of Birth: December 18, 1955           MRN: [id number]             PCP: Antony Contras, MD Referring: Antony Contras, MD Visit Date: 01/16/2018 Occupation: @GUAROCC @  Subjective:  No chief complaint on file.   History of Present Illness: Victor [name] is a 62 y.o. male ***   Activities of Daily Living:  Patient reports morning stiffness for *** {minute/hour:19697}.   Patient {ACTIONS;DENIES/REPORTS:21021675::"Denies"} nocturnal pain.  Difficulty dressing/grooming: {ACTIONS;DENIES/REPORTS:21021675::"Denies"} Difficulty climbing stairs: {ACTIONS;DENIES/REPORTS:21021675::"Denies"} Difficulty getting out of chair: {ACTIONS;DENIES/REPORTS:21021675::"Denies"} Difficulty using hands for taps, buttons, cutlery, and/or writing: {ACTIONS;DENIES/REPORTS:21021675::"Denies"}  No Rheumatology ROS completed.   PMFS History:  Patient Active Problem List   Diagnosis Date Noted  . Lumbar foraminal stenosis 08/06/2017  . HNP (herniated nucleus pulposus), thoracic 09/05/2016  . Chronic kidney disease (CKD) 04/26/2016  . DJD (degenerative joint disease), cervical 04/26/2016  . Essential hypertension 04/26/2016  . Dyslipidemia 04/26/2016  . Sleep apnea 04/26/2016  . Fibromyalgia 04/18/2016  . Idiopathic chronic gout of multiple sites without tophus 04/18/2016  . Spondylosis of lumbar region without myelopathy or radiculopathy 04/18/2016  . Primary osteoarthritis of both knees 04/18/2016  . Lumbar herniated disc 07/22/2015  . Morbid obesity ([place]) 02/25/2014  . S/P right TKA 02/24/2014  . Heartburn 01/10/2011    Past Medical History:  Diagnosis Date  . Anxiety   . Arthritis    "all over" (04/15/2013)  . Back pain, chronic   . DDD (degenerative disc disease), cervical   . DDD (degenerative disc disease), lumbar   . Fibromyalgia   . GERD (gastroesophageal reflux disease)   . Gout   . High cholesterol   . Hypertension     . Numbness of feet   . Pre-diabetes   . Radiculopathy of lumbar region   . Sleep apnea    "don't use CPAP" (04/15/2013)    Family History  Problem Relation Age of Onset  . Hypertension Father   . Cancer Mother        breast   . Heart Problems Mother   . Alzheimer's disease Mother   . Allergies Daughter   . Interstitial cystitis Daughter    Past Surgical History:  Procedure Laterality Date  . ANTERIOR CERVICAL DECOMP/DISCECTOMY FUSION  2000's X3   "had 3 surgeries on my front neck" (04/15/2013)  . ANTERIOR LAT LUMBAR FUSION Left 08/06/2017   Procedure: LEFT LUMBAR TWO-THREE ANTERIOR LATERAL LUMBAR FUSION WITH PERCUTANEOUS SCREWS;  Surgeon: Earnie Larsson, MD;  Location: [facility];  Service: Neurosurgery;  Laterality: Left;  . APPENDECTOMY  ~ 2010  . CARPAL TUNNEL RELEASE Left 1990's  . FLEXIBLE SIGMOIDOSCOPY  12/21/2011   Procedure: FLEXIBLE SIGMOIDOSCOPY;  Surgeon: Lear Ng, MD;  Location: [facility];  Service: Endoscopy;  Laterality: N/A;  . HAND TENDON SURGERY Left ~ 1978 X2-1980's X3   "had 5 ORs after laceration"  . HOT HEMOSTASIS  12/21/2011   Procedure: HOT HEMOSTASIS (ARGON PLASMA COAGULATION/BICAP);  Surgeon: Lear Ng, MD;  Location: [facility];  Service: Endoscopy;  Laterality: N/A;  . INGUINAL HERNIA REPAIR Bilateral 1970's - 1984  . INGUINAL HERNIA REPAIR Left 04/15/2013  . INGUINAL HERNIA REPAIR Left 04/15/2013   Procedure: LAPAROSCOPIC REPAIR OF INCARCERATED LEFT INGUINAL HERNIA;  Surgeon: Gayland Curry, MD;  Location: [facility];  Service: General;  Laterality: Left;  . INSERTION  OF MESH Left 04/15/2013   Procedure: INSERTION OF MESH;  Surgeon: Gayland Curry, MD;  Location: [facility];  Service: General;  Laterality: Left;  . KNEE ARTHROSCOPY Right 1990's  . LAPAROSCOPY N/A 04/15/2013   Procedure: LAPAROSCOPY DIAGNOSTIC;  Surgeon: Gayland Curry, MD;  Location: [facility];  Service: General;  Laterality: N/A;  . LUMBAR LAMINECTOMY/DECOMPRESSION MICRODISCECTOMY Right  07/22/2015   Procedure: Right Lumbar Two-Three Microdiscectomy;  Surgeon: Earnie Larsson, MD;  Location: [facility];  Service: Neurosurgery;  Laterality: Right;  . LUMBAR LAMINECTOMY/DECOMPRESSION MICRODISCECTOMY Right 09/05/2016   Procedure: Microdiscectomy - right - T12-L1;  Surgeon: Earnie Larsson, MD;  Location: [facility];  Service: Neurosurgery;  Laterality: Right;  . NASAL SEPTUM SURGERY Bilateral 2014   "removed polyps & infection"  . POLYPECTOMY  01/10/2011   Procedure: POLYPECTOMY;  Surgeon: Lear Ng, MD;  Location: [facility];  Service: Endoscopy;  Laterality: N/A;  . POSTERIOR LUMBAR FUSION  11/1995   "ray cages" (04/15/2013)  . SHOULDER ARTHROSCOPY Bilateral    "twice each"  . TONSILLECTOMY  1960's  . TOTAL KNEE ARTHROPLASTY Right 02/24/2014   Procedure: TOTAL RIGHT KNEE ARTHROPLASTY;  Surgeon: Mauri Pole, MD;  Location: [facility];  Service: Orthopedics;  Laterality: Right;  Marland Kitchen VASECTOMY  1988  . WISDOM TOOTH EXTRACTION  2000's   Social History   Social History Narrative  . Not on file    Objective: Vital Signs: There were no vitals taken for this visit.   Physical Exam   Musculoskeletal Exam: ***  CDAI Exam: CDAI Score: Not documented Patient Global Assessment: Not documented; Provider Global Assessment: Not documented Swollen: Not documented; Tender: Not documented Joint Exam   Not documented   There is currently no information documented on the homunculus. Go to the Rheumatology activity and complete the homunculus joint exam.  Investigation: No additional findings.  Imaging: No results found.  Recent Labs: Lab Results  Component Value Date   WBC 9.3 07/30/2017   HGB 16.9 07/30/2017   PLT 192 07/30/2017   NA 138 07/30/2017   K 4.2 07/30/2017   CL 103 07/30/2017   CO2 26 07/30/2017   GLUCOSE 113 (H) 07/30/2017   BUN 11 07/30/2017   CREATININE 1.08 07/30/2017   BILITOT 0.8 04/15/2013   ALKPHOS 46 04/15/2013   AST 71 (H) 04/15/2013   ALT 40  04/15/2013   PROT 7.5 04/15/2013   ALBUMIN 4.0 04/15/2013   CALCIUM 9.3 07/30/2017   GFRAA >60 07/30/2017    Speciality Comments: No specialty comments available.  Procedures:  No procedures performed Allergies: Flexeril [cyclobenzaprine]; Tetracyclines & related; and Niacin and related   Assessment / Plan:     Visit Diagnoses: No diagnosis found.   Orders: No orders of the defined types were placed in this encounter.  No orders of the defined types were placed in this encounter.   Face-to-face time spent with patient was *** minutes. Greater than 50% of time was spent in counseling and coordination of care.  Follow-Up Instructions: No follow-ups on file.   Earnestine Mealing, CMA  Note - This record has been created using Editor, commissioning.  Chart creation errors have been sought, but may not always  have been located. Such creation errors do not reflect on  the standard of medical care.

## 2018-01-06 ENCOUNTER — Other Ambulatory Visit: Payer: Self-pay | Admitting: Rheumatology

## 2018-01-07 NOTE — Telephone Encounter (Signed)
Last Visit: 07/05/17 Next Visit: 01/16/18 Labs: 07/30/17 elevated glucose  Okay to refill per Dr. Estanislado Pandy

## 2018-01-16 ENCOUNTER — Ambulatory Visit: Payer: 59 | Admitting: Rheumatology

## 2018-07-04 ENCOUNTER — Other Ambulatory Visit: Payer: Self-pay | Admitting: Rheumatology

## 2018-07-05 NOTE — Telephone Encounter (Addendum)
Last Visit: 07/05/17 Next Visit was due in November 2019. Message sent to the front to schedule patient. Labs: 07/30/17 elevated glucose  Attempted to contact the patient to schedule follow up appointment and advise he is due for labs. Left message with patient's wife and she will have patient call back to schedule.

## 2018-08-09 ENCOUNTER — Other Ambulatory Visit: Payer: Self-pay | Admitting: *Deleted

## 2018-08-09 ENCOUNTER — Telehealth: Payer: Self-pay | Admitting: Rheumatology

## 2018-08-09 MED ORDER — PREDNISONE 5 MG PO TABS: ORAL_TABLET | ORAL | 0 refills | Status: DC

## 2018-08-09 NOTE — Telephone Encounter (Signed)
Please call patient to schedule sooner appt. See note. Prednisone RX sent to pharmacy. LMOM for patient.

## 2018-08-09 NOTE — Telephone Encounter (Signed)
Patient called requesting prescription refill of Prednisone to be sent to [facility] at [address] [facility].  Patient states he is having a "gout flair."  Patient scheduled an appointment with Dr. Estanislado Pandy for 10/02/18.

## 2018-08-09 NOTE — Telephone Encounter (Signed)
Please schedule a sooner follow up visit to obtain lab work and discuss medications.  Ok to send in prednisone taper starting at 20 mg and tapering by 5 mg every 4 days.

## 2018-08-12 ENCOUNTER — Telehealth: Payer: Self-pay | Admitting: Rheumatology

## 2018-08-12 NOTE — Telephone Encounter (Signed)
Victor [name] that patient can keep his appointment in August. She verbalized understanding and will have labs faxed over after they are drawn at Dr. Marquette Saa office. [name] also stated they have requested uric acid be added to the lab draw as well.

## 2018-08-12 NOTE — Telephone Encounter (Signed)
LMOM for patient to call and schedule an earlier appointment.

## 2018-08-12 NOTE — Telephone Encounter (Signed)
Victor [name] called stating Victor [name] has an appointment scheduled with his PCP Dr. Moreen Fowler on 09/03/18 and will be having labwork at that time.  Victor [name] states they will have Dr. Moreen Fowler forward the results to Dr. Estanislado Pandy.  Victor [name] states since he is having labwork in July is it okay to keep his appointment with Dr. Estanislado Pandy as scheduled on 10/03/18.  Please advise

## 2018-09-06 ENCOUNTER — Telehealth: Payer: Self-pay | Admitting: *Deleted

## 2018-09-06 NOTE — Telephone Encounter (Signed)
Patient's wife contacted the office requesting a refill on Colchicine and gabapentin 90 day supply. She was also asking if we received labs results from PCP. Advised we have not received them and provided fax number. Will refill once results received.

## 2018-09-10 ENCOUNTER — Other Ambulatory Visit: Payer: Self-pay | Admitting: *Deleted

## 2018-09-10 NOTE — Telephone Encounter (Signed)
Last Visit: 07/05/17 Next Visit: 10/03/18 Labs: 09/03/18 From PCP Glucose 127, TBil 1.1 Uric Acid 5.3 No CBC   Okay to refill Gabapentin and Colchicine?

## 2018-09-11 MED ORDER — GABAPENTIN 300 MG PO CAPS: ORAL_CAPSULE | ORAL | 0 refills | Status: DC

## 2018-09-11 MED ORDER — COLCHICINE 0.6 MG PO TABS: 0.6000 mg | ORAL_TABLET | Freq: Two times a day (BID) | ORAL | 0 refills | Status: DC

## 2018-09-11 NOTE — Telephone Encounter (Signed)
ok 

## 2018-09-19 NOTE — Progress Notes (Signed)
Office Visit Note  Patient: Victor [name]             Date of Birth: [birth year]           MRN: [id number]             PCP: Antony Contras, MD Referring: Antony Contras, MD Visit Date: 10/03/2018 Occupation: @GUAROCC @  Subjective:  Medication monitoring.   History of Present Illness: Victor [name] is a 63 y.o. male with history of osteoarthritis, gouty arthropathy and degenerative disc disease.  He states he has not had a gout flare in several months.  He had a flare few months back which was in in his right foot and right hand for which she required prednisone.  The flare responded to the treatment.  He states he also had some arthritis in his right hip joint for which she was seen by his back specialist.  Patient states he continues to have some paresthesias in his feet.  He ran out of gabapentin.  He recently filled the prescription and is noticing some improvement.  Activities of Daily Living:  Patient reports morning stiffness for 10 minutes.   Patient Denies nocturnal pain.  Difficulty dressing/grooming: Denies Difficulty climbing stairs: Reports Difficulty getting out of chair: Reports Difficulty using hands for taps, buttons, cutlery, and/or writing: Denies  Review of Systems  Constitutional: Positive for fatigue. Negative for night sweats.  HENT: Positive for mouth dryness. Negative for mouth sores and nose dryness.   Eyes: Negative for redness and dryness.  Respiratory: Negative for shortness of breath and difficulty breathing.   Cardiovascular: Negative for chest pain, palpitations, hypertension, irregular heartbeat and swelling in legs/feet.  Gastrointestinal: Negative for constipation and diarrhea.  Endocrine: Negative for increased urination.  Musculoskeletal: Positive for arthralgias, joint pain and morning stiffness. Negative for joint swelling, myalgias, muscle weakness, muscle tenderness and myalgias.  Skin: Negative for color change, rash, hair loss,  nodules/bumps, skin tightness, ulcers and sensitivity to sunlight.  Allergic/Immunologic: Negative for susceptible to infections.  Neurological: Positive for numbness and parasthesias. Negative for dizziness, fainting, memory loss, night sweats and weakness ( ).  Hematological: Negative for swollen glands.  Psychiatric/Behavioral: Positive for depressed mood and sleep disturbance. The patient is nervous/anxious.     PMFS History:  Patient Active Problem List   Diagnosis Date Noted   Lumbar foraminal stenosis 08/06/2017   HNP (herniated nucleus pulposus), thoracic 09/05/2016   Chronic kidney disease (CKD) 04/26/2016   DJD (degenerative joint disease), cervical 04/26/2016   Essential hypertension 04/26/2016   Dyslipidemia 04/26/2016   Sleep apnea 04/26/2016   Fibromyalgia 04/18/2016   Idiopathic chronic gout of multiple sites without tophus 04/18/2016   Spondylosis of lumbar region without myelopathy or radiculopathy 04/18/2016   Primary osteoarthritis of both knees 04/18/2016   Lumbar herniated disc 07/22/2015   Morbid obesity ([place]) 02/25/2014   S/P right TKA 02/24/2014   Heartburn 01/10/2011    Past Medical History:  Diagnosis Date   Anxiety    Arthritis    "all over" (04/15/2013)   Back pain, chronic    DDD (degenerative disc disease), cervical    DDD (degenerative disc disease), lumbar    Fibromyalgia    GERD (gastroesophageal reflux disease)    Gout    High cholesterol    Hypertension    Numbness of feet    Pre-diabetes    Radiculopathy of lumbar region    Sleep apnea    "don't use CPAP" (04/15/2013)    Family History  Problem Relation Age of Onset   Hypertension Father    Cancer Mother        breast    Heart Problems Mother    Alzheimer's disease Mother    Allergies Daughter    Interstitial cystitis Daughter    Past Surgical History:  Procedure Laterality Date   ANTERIOR CERVICAL DECOMP/DISCECTOMY FUSION  2000's X3   "had  3 surgeries on my front neck" (04/15/2013)   ANTERIOR LAT LUMBAR FUSION Left 08/06/2017   Procedure: LEFT LUMBAR TWO-THREE ANTERIOR LATERAL LUMBAR FUSION WITH PERCUTANEOUS SCREWS;  Surgeon: Earnie Larsson, MD;  Location: [place];  Service: Neurosurgery;  Laterality: Left;   APPENDECTOMY  ~ 2010   CARPAL TUNNEL RELEASE Left 1990's   FLEXIBLE SIGMOIDOSCOPY  12/21/2011   Procedure: FLEXIBLE SIGMOIDOSCOPY;  Surgeon: Lear Ng, MD;  Location: [facility];  Service: Endoscopy;  Laterality: N/A;   HAND TENDON SURGERY Left ~ 1978 X2-1980's X3   "had 5 ORs after laceration"   HOT HEMOSTASIS  12/21/2011   Procedure: HOT HEMOSTASIS (ARGON PLASMA COAGULATION/BICAP);  Surgeon: Lear Ng, MD;  Location: [facility];  Service: Endoscopy;  Laterality: N/A;   INGUINAL HERNIA REPAIR Bilateral 1970's - 1984   INGUINAL HERNIA REPAIR Left 04/15/2013   INGUINAL HERNIA REPAIR Left 04/15/2013   Procedure: LAPAROSCOPIC REPAIR OF INCARCERATED LEFT INGUINAL HERNIA;  Surgeon: Gayland Curry, MD;  Location: [place];  Service: General;  Laterality: Left;   INSERTION OF MESH Left 04/15/2013   Procedure: INSERTION OF MESH;  Surgeon: Gayland Curry, MD;  Location: [place];  Service: General;  Laterality: Left;   KNEE ARTHROSCOPY Right 1990's   LAPAROSCOPY N/A 04/15/2013   Procedure: LAPAROSCOPY DIAGNOSTIC;  Surgeon: Gayland Curry, MD;  Location: [place];  Service: General;  Laterality: N/A;   LUMBAR LAMINECTOMY/DECOMPRESSION MICRODISCECTOMY Right 07/22/2015   Procedure: Right Lumbar Two-Three Microdiscectomy;  Surgeon: Earnie Larsson, MD;  Location: [facility];  Service: Neurosurgery;  Laterality: Right;   LUMBAR LAMINECTOMY/DECOMPRESSION MICRODISCECTOMY Right 09/05/2016   Procedure: Microdiscectomy - right - T12-L1;  Surgeon: Earnie Larsson, MD;  Location: [place];  Service: Neurosurgery;  Laterality: Right;   NASAL SEPTUM SURGERY Bilateral 2014   "removed polyps & infection"   POLYPECTOMY  01/10/2011    Procedure: POLYPECTOMY;  Surgeon: Lear Ng, MD;  Location: [facility];  Service: Endoscopy;  Laterality: N/A;   POSTERIOR LUMBAR FUSION  11/1995   "ray cages" (04/15/2013)   SHOULDER ARTHROSCOPY Bilateral    "twice each"   TONSILLECTOMY  1960's   TOTAL KNEE ARTHROPLASTY Right 02/24/2014   Procedure: TOTAL RIGHT KNEE ARTHROPLASTY;  Surgeon: Mauri Pole, MD;  Location: [facility];  Service: Orthopedics;  Laterality: Right;   [place] EXTRACTION  2000's   Social History   Social History Narrative   Not on file   Immunization History  Administered Date(s) Administered   Influenza Whole 11/08/2011   Influenza-Unspecified 02/27/2013     Objective: Vital Signs: BP (!) 170/109 (BP Location: Right Arm, Patient Position: Sitting, Cuff Size: Large)    Pulse 65    Resp 15    Ht 5' 8.5" (1.74 m)    Wt 227 lb (103 kg)    BMI 34.01 kg/m    Physical Exam Vitals signs and nursing note reviewed.  Constitutional:      Appearance: He is well-developed.  HENT:     Head: Normocephalic and atraumatic.  Eyes:     Conjunctiva/sclera: Conjunctivae normal.  Pupils: Pupils are equal, round, and reactive to light.  Neck:     Musculoskeletal: Normal range of motion and neck supple.  Cardiovascular:     Rate and Rhythm: Normal rate and regular rhythm.     Heart sounds: Normal heart sounds.  Pulmonary:     Effort: Pulmonary effort is normal.     Breath sounds: Normal breath sounds.  Abdominal:     General: Bowel sounds are normal.     Palpations: Abdomen is soft.  Skin:    General: Skin is warm and dry.     Capillary Refill: Capillary refill takes less than 2 seconds.  Neurological:     Mental Status: He is alert and oriented to person, place, and time.  Psychiatric:        Behavior: Behavior normal.      Musculoskeletal Exam: He has limited and painful range of motion of his cervical thoracic and lumbar spine.  Shoulder joints with good range of  motion.  Elbow joints with good range of motion.  He has bilateral DIP and PIP thickening with no synovitis.  He had discomfort with range of motion of knee joints with no synovitis.  Right total knee replacement is doing well.  He describes some discomfort over ankle joints but no synovitis was noted.  CDAI Exam: CDAI Score: -- Patient Global: --; Provider Global: -- Swollen: --; Tender: -- Joint Exam   No joint exam has been documented for this visit   There is currently no information documented on the homunculus. Go to the Rheumatology activity and complete the homunculus joint exam.  Investigation: No additional findings.  Imaging: No results found.  Recent Labs: Lab Results  Component Value Date   WBC 9.3 07/30/2017   HGB 16.9 07/30/2017   PLT 192 07/30/2017   NA 138 07/30/2017   K 4.2 07/30/2017   CL 103 07/30/2017   CO2 26 07/30/2017   GLUCOSE 113 (H) 07/30/2017   BUN 11 07/30/2017   CREATININE 1.08 07/30/2017   BILITOT 0.8 04/15/2013   ALKPHOS 46 04/15/2013   AST 71 (H) 04/15/2013   ALT 40 04/15/2013   PROT 7.5 04/15/2013   ALBUMIN 4.0 04/15/2013   CALCIUM 9.3 07/30/2017   GFRAA >60 07/30/2017  September 03, 2018 uric acid 4.6, CMP normal at [facility] Comments: No specialty comments available.  Procedures:  No procedures performed Allergies: Flexeril [cyclobenzaprine], Tetracyclines & related, and Niacin and related   Assessment / Plan:     Visit Diagnoses: Idiopathic chronic gout of multiple sites without tophus -patient reports having a flare of gout few weeks ago requiring prednisone.  He has had no recurrence.  According to his wife who was on the phone his right hand is still swollen.  Although I did not notice any swelling on his hand.  I also reviewed his labs from his PCPs office his uric acid was in desirable range.  Have advised him to continue with the allopurinol and take colchicine on PRN basis.  He should get labs every 6 months which should  include CBC, CMP and uric acid  Primary osteoarthritis of both hands -he has DIP and PIP thickening and discomfort in his hands.  Joint protection was discussed.  Primary osteoarthritis of left knee -he continues to have knee joint discomfort.  History of total right knee replacement (TKR) -he has chronic pain.  Primary osteoarthritis of both feet -he describes discomfort walking due to osteoarthritis and paresthesias.  DDD (degenerative disc disease), cervical -  he has limited range of motion.  Spondylosis of lumbar region without myelopathy or radiculopathy -he has limited range of motion and discomfort.  Fibromyalgia -he has generalized hyperalgesia and positive tender points.  History of insomnia -patient states that he has insomnia despite taking the medications.   History of hypertension -his blood pressure was quite elevated today.  Have advised him to monitor blood pressure closely and follow-up with his PCP.  He was advised to contact his PCP today.  History of sleep apnea -he uses CPAP.  History of hypercholesterolemia   History of hiatal hernia  History of gastroesophageal reflux (GERD)   Orders: No orders of the defined types were placed in this encounter.  No orders of the defined types were placed in this encounter.   Follow-Up Instructions: Return in about 6 months (around 04/05/2019) for Osteoarthritis, Gout.   Bo Merino, MD  Note - This record has been created using Editor, commissioning.  Chart creation errors have been sought, but may not always  have been located. Such creation errors do not reflect on  the standard of medical care.

## 2018-10-03 ENCOUNTER — Encounter: Payer: Self-pay | Admitting: Rheumatology

## 2018-10-03 ENCOUNTER — Other Ambulatory Visit: Payer: Self-pay

## 2018-10-03 ENCOUNTER — Ambulatory Visit (INDEPENDENT_AMBULATORY_CARE_PROVIDER_SITE_OTHER): Payer: Medicare Other | Admitting: Rheumatology

## 2018-10-03 VITALS — BP 170/109 | HR 65 | Resp 15 | Ht 68.5 in | Wt 227.0 lb

## 2018-10-03 DIAGNOSIS — Z87898 Personal history of other specified conditions: Secondary | ICD-10-CM

## 2018-10-03 DIAGNOSIS — M1A09X0 Idiopathic chronic gout, multiple sites, without tophus (tophi): Secondary | ICD-10-CM | POA: Diagnosis not present

## 2018-10-03 DIAGNOSIS — M1712 Unilateral primary osteoarthritis, left knee: Secondary | ICD-10-CM | POA: Diagnosis not present

## 2018-10-03 DIAGNOSIS — M47816 Spondylosis without myelopathy or radiculopathy, lumbar region: Secondary | ICD-10-CM

## 2018-10-03 DIAGNOSIS — M19072 Primary osteoarthritis, left ankle and foot: Secondary | ICD-10-CM

## 2018-10-03 DIAGNOSIS — Z96651 Presence of right artificial knee joint: Secondary | ICD-10-CM

## 2018-10-03 DIAGNOSIS — Z8679 Personal history of other diseases of the circulatory system: Secondary | ICD-10-CM

## 2018-10-03 DIAGNOSIS — M5030 Other cervical disc degeneration, unspecified cervical region: Secondary | ICD-10-CM

## 2018-10-03 DIAGNOSIS — M19071 Primary osteoarthritis, right ankle and foot: Secondary | ICD-10-CM

## 2018-10-03 DIAGNOSIS — M19041 Primary osteoarthritis, right hand: Secondary | ICD-10-CM | POA: Diagnosis not present

## 2018-10-03 DIAGNOSIS — M19042 Primary osteoarthritis, left hand: Secondary | ICD-10-CM

## 2018-10-03 DIAGNOSIS — Z8639 Personal history of other endocrine, nutritional and metabolic disease: Secondary | ICD-10-CM

## 2018-10-03 DIAGNOSIS — Z8669 Personal history of other diseases of the nervous system and sense organs: Secondary | ICD-10-CM

## 2018-10-03 DIAGNOSIS — Z8719 Personal history of other diseases of the digestive system: Secondary | ICD-10-CM

## 2018-10-03 DIAGNOSIS — M797 Fibromyalgia: Secondary | ICD-10-CM

## 2018-10-03 NOTE — Patient Instructions (Signed)
CBC, CMP and uric acid are due in January 2021

## 2018-12-03 ENCOUNTER — Other Ambulatory Visit: Payer: Self-pay | Admitting: Rheumatology

## 2018-12-03 NOTE — Telephone Encounter (Signed)
Ok to refill 

## 2018-12-03 NOTE — Telephone Encounter (Signed)
Last Visit: 10/03/18 Next Visit: 04/03/19 Labs: 09/02/18 uric acid 4.6, CMP normal   Okay to refill colchicine?

## 2018-12-03 NOTE — Telephone Encounter (Signed)
Last Visit: 10/03/18 Next Visit: 04/03/19 Labs: 09/02/18 uric acid 4.6, CMP normal   Okay to refill per Dr. Estanislado Pandy

## 2019-03-01 ENCOUNTER — Other Ambulatory Visit: Payer: Self-pay | Admitting: Rheumatology

## 2019-03-03 NOTE — Telephone Encounter (Addendum)
Last Visit: 10/03/18 Next Visit: 04/03/19 Labs: 09/02/18 uric acid 4.6, CMP normal   Left message to advise patient he is due to update labs this month.  Okay to refill 30 day supply per Dr. Estanislado Pandy

## 2019-03-17 ENCOUNTER — Other Ambulatory Visit: Payer: Self-pay | Admitting: Rheumatology

## 2019-03-18 NOTE — Telephone Encounter (Signed)
Last Visit:10/03/18 Next Visit:04/03/19 Labs:7/6/20uric acid 4.6, CMP normal

## 2019-03-27 ENCOUNTER — Telehealth: Payer: Self-pay | Admitting: *Deleted

## 2019-03-27 NOTE — Telephone Encounter (Signed)
Received labs from [place] at [facility] drawn on 03/25/19  CBC WNL Uric Acid 5.5

## 2019-03-31 ENCOUNTER — Telehealth: Payer: Self-pay | Admitting: Rheumatology

## 2019-03-31 NOTE — Telephone Encounter (Signed)
Patient's wife [name] called checking if Dr. Estanislado Pandy received Steve's labwork results.  Debbie states Dr. Caprice Red from [facility] faxed the labwork results on Wednesday, 03/26/19.

## 2019-03-31 NOTE — Telephone Encounter (Signed)
Patient's wife [name] advised we have received CBC and Uric Acid. She will send the CMP.

## 2019-04-01 NOTE — Progress Notes (Signed)
Virtual Visit via Telephone Note  I connected with Victor [name] on 04/07/19 at 11:45 AM EST by telephone and verified that I am speaking with the correct person using two identifiers.  Location: Patient: Home  Provider: Clinic  This service was conducted via virtual visit. The patient was located at home. I was located in my office.  Consent was obtained prior to the virtual visit and is aware of possible charges through their insurance for this visit.  The patient is an established patient.  Dr. Estanislado Pandy, MD conducted the virtual visit and Victor Sams, PA-C acted as scribe during the service.  Office staff helped with scheduling follow up visits after the service was conducted.   I discussed the limitations, risks, security and privacy concerns of performing an evaluation and management service by telephone and the availability of in person appointments. I also discussed with the patient that there may be a patient responsible charge related to this service. The patient expressed understanding and agreed to proceed.  CC: Pain in multiple joints  History of Present Illness: Patient is a 64 year old male with a past medical history of gout, osteoarthritis, and DDD.  He has chronic pain in both hands and both feet.  He states his right knee replacement is doing well.  He has increased pain in the left knee joint but he is not ready to proceed with a replacement at this time.  He has chronic neck and lower back pain.  He states his fibromyalgia pain has been about the same.  He continues to take gabapentin as prescribed.    Review of Systems  Constitutional: Positive for malaise/fatigue.  HENT: Negative for congestion.   Eyes: Negative for pain.  Respiratory: Negative for shortness of breath.   Cardiovascular: Negative for leg swelling.  Gastrointestinal: Negative for constipation.  Genitourinary: Negative for frequency.  Musculoskeletal: Positive for back pain, joint pain and neck  pain.  Skin: Negative for rash.  Neurological: Negative for weakness.  Endo/Heme/Allergies: Bruises/bleeds easily.  Psychiatric/Behavioral: Negative for memory loss.      Observations/Objective: Physical Exam  Constitutional: He is oriented to person, place, and time.  Neurological: He is alert and oriented to person, place, and time.  Psychiatric: Mood, memory, affect and judgment normal.    Patient reports morning stiffness for 5 minutes.   Patient reports nocturnal pain.  Difficulty dressing/grooming: Denies Difficulty climbing stairs: Reports Difficulty getting out of chair: Reports Difficulty using hands for taps, buttons, cutlery, and/or writing: Denies  Assessment and Plan: Visit Diagnoses: Idiopathic chronic gout of multiple sites without tophus -He has not had any recent gout flares. He is clinically doing well on allopurinol 450 mg po daily and colchicine 0.6 mg 1 tablet by mouth BID.  Uric acid was 5.5 on 03/25/19. Uric acid was within the desirable range. He will continue taking allopurinol as prescribed.  He was advised to reduce colchicine 0.6 mg 1 tablet daily. He was advised to notify us if he develops signs or symptoms of a gout flare. He will follow up in 6 months.   Primary osteoarthritis of both hands -He has chronic pain in both hands.  No joint inflammation.  Primary osteoarthritis of left knee -Chronic pain.  He is not ready to proceed with a knee replacement at this time.  History of total right knee replacement (TKR): Doing well.   Primary osteoarthritis of both feet -He has chronic pain in both feet.  No joint swelling at this time. We discussed  the importance of wearing proper fitting shoes.  DDD (degenerative disc disease), cervical -Chronic pain. He has been experiencing symptoms of radiculopathy.  He is planning on proceeding with MRI of the C-spine soon. He is followed by Dr. Annette Stable.  Spondylosis of lumbar region without myelopathy or  radiculopathy -Chronic pain.  He is followed by Dr. Annette Stable.  He takes oxycodone as needed for pain relief.   Fibromyalgia: He has generalized muscle aches and muscle tenderness due to fibromyalgia.  He has chronic nocturnal pain, which contributes to his insomnia and fatigue.  He continues to take gabapentin 300 mg 2 capsules TID and oxycodone as needed for pain relief. We discussed the importance of regular exercise and good sleep hygiene.   History of insomnia: He has difficulty sleeping due to nocturnal pain.   Other medical conditions are listed as follows:   History of hypertension  History of sleep apnea -he uses CPAP.  History of hypercholesterolemia   History of hiatal hernia  History of gastroesophageal reflux (GERD)   Follow Up Instructions: He will follow up in  6 months.    I discussed the assessment and treatment plan with the patient. The patient was provided an opportunity to ask questions and all were answered. The patient agreed with the plan and demonstrated an understanding of the instructions.   The patient was advised to call back or seek an in-person evaluation if the symptoms worsen or if the condition fails to improve as anticipated.  I provided 30 minutes of non-face-to-face time during this encounter.   Bo Merino, MD   Scribed by-  Victor Sams, PA-C

## 2019-04-03 ENCOUNTER — Ambulatory Visit: Payer: Medicare Other | Admitting: Rheumatology

## 2019-04-07 ENCOUNTER — Telehealth (INDEPENDENT_AMBULATORY_CARE_PROVIDER_SITE_OTHER): Payer: Medicare Other | Admitting: Rheumatology

## 2019-04-07 ENCOUNTER — Telehealth: Payer: Self-pay | Admitting: Rheumatology

## 2019-04-07 ENCOUNTER — Other Ambulatory Visit: Payer: Self-pay

## 2019-04-07 ENCOUNTER — Other Ambulatory Visit: Payer: Self-pay | Admitting: *Deleted

## 2019-04-07 ENCOUNTER — Encounter: Payer: Self-pay | Admitting: Rheumatology

## 2019-04-07 DIAGNOSIS — Z96651 Presence of right artificial knee joint: Secondary | ICD-10-CM

## 2019-04-07 DIAGNOSIS — Z8719 Personal history of other diseases of the digestive system: Secondary | ICD-10-CM

## 2019-04-07 DIAGNOSIS — Z8679 Personal history of other diseases of the circulatory system: Secondary | ICD-10-CM

## 2019-04-07 DIAGNOSIS — Z8669 Personal history of other diseases of the nervous system and sense organs: Secondary | ICD-10-CM

## 2019-04-07 DIAGNOSIS — M1A09X0 Idiopathic chronic gout, multiple sites, without tophus (tophi): Secondary | ICD-10-CM | POA: Diagnosis not present

## 2019-04-07 DIAGNOSIS — M5030 Other cervical disc degeneration, unspecified cervical region: Secondary | ICD-10-CM

## 2019-04-07 DIAGNOSIS — M47816 Spondylosis without myelopathy or radiculopathy, lumbar region: Secondary | ICD-10-CM

## 2019-04-07 DIAGNOSIS — M1712 Unilateral primary osteoarthritis, left knee: Secondary | ICD-10-CM | POA: Diagnosis not present

## 2019-04-07 DIAGNOSIS — E785 Hyperlipidemia, unspecified: Secondary | ICD-10-CM

## 2019-04-07 DIAGNOSIS — M19042 Primary osteoarthritis, left hand: Secondary | ICD-10-CM

## 2019-04-07 DIAGNOSIS — M19071 Primary osteoarthritis, right ankle and foot: Secondary | ICD-10-CM

## 2019-04-07 DIAGNOSIS — Z8639 Personal history of other endocrine, nutritional and metabolic disease: Secondary | ICD-10-CM

## 2019-04-07 DIAGNOSIS — N189 Chronic kidney disease, unspecified: Secondary | ICD-10-CM

## 2019-04-07 DIAGNOSIS — M797 Fibromyalgia: Secondary | ICD-10-CM

## 2019-04-07 DIAGNOSIS — M19041 Primary osteoarthritis, right hand: Secondary | ICD-10-CM | POA: Diagnosis not present

## 2019-04-07 DIAGNOSIS — Z87898 Personal history of other specified conditions: Secondary | ICD-10-CM

## 2019-04-07 DIAGNOSIS — M19072 Primary osteoarthritis, left ankle and foot: Secondary | ICD-10-CM

## 2019-04-07 MED ORDER — COLCHICINE 0.6 MG PO TABS: 0.6000 mg | ORAL_TABLET | Freq: Every day | ORAL | 0 refills | Status: DC

## 2019-04-07 NOTE — Telephone Encounter (Signed)
Patient's wife Hilda Blades will call back tomorrow to schedule Victor [name]'s 6 month follow-up appointment.

## 2019-04-07 NOTE — Telephone Encounter (Signed)
-----   Message from Shona Needles, RT sent at 04/07/2019  1:06 PM EST ----- Regarding: 6 MONTH F/U

## 2019-04-08 ENCOUNTER — Other Ambulatory Visit: Payer: Self-pay | Admitting: Rheumatology

## 2019-04-08 NOTE — Telephone Encounter (Signed)
Last visit: 04/08/19 Next Visit: 09/30/19  Okay to refill per Dr. Estanislado Pandy

## 2019-05-02 ENCOUNTER — Other Ambulatory Visit: Payer: Self-pay | Admitting: Rheumatology

## 2019-05-02 NOTE — Telephone Encounter (Signed)
Last visit: 04/08/19 Next Visit: 09/30/19 Labs: 03/25/19 Uric acid 5.5 CBC WNL  Okay to refill per Dr. Estanislado Pandy

## 2019-07-14 ENCOUNTER — Other Ambulatory Visit: Payer: Self-pay | Admitting: Neurosurgery

## 2019-07-14 DIAGNOSIS — M5412 Radiculopathy, cervical region: Secondary | ICD-10-CM

## 2019-07-24 ENCOUNTER — Other Ambulatory Visit: Payer: Self-pay | Admitting: Rheumatology

## 2019-07-24 NOTE — Telephone Encounter (Signed)
Ok to refill allopurinol

## 2019-07-24 NOTE — Telephone Encounter (Signed)
Last visit: 04/08/19 Next Visit: 09/30/19 Labs: 03/25/19 Uric acid 5.5 CBC WNL  Current Dose per office note on 04/07/2019: allopurinol 450 mg po daily   Okay to refill Allopurinol?

## 2019-08-15 ENCOUNTER — Other Ambulatory Visit: Payer: Self-pay

## 2019-08-15 ENCOUNTER — Ambulatory Visit
Admission: RE | Admit: 2019-08-15 | Discharge: 2019-08-15 | Disposition: A | Discharge status: Home / Self Care | Payer: Medicare Other | Source: Ambulatory Visit | Attending: Neurosurgery | Admitting: Neurosurgery

## 2019-08-15 DIAGNOSIS — M5412 Radiculopathy, cervical region: Secondary | ICD-10-CM

## 2019-08-20 ENCOUNTER — Other Ambulatory Visit: Payer: Self-pay | Admitting: Orthopedic Surgery

## 2019-08-20 DIAGNOSIS — M19012 Primary osteoarthritis, left shoulder: Secondary | ICD-10-CM

## 2019-08-22 ENCOUNTER — Other Ambulatory Visit: Payer: Self-pay | Admitting: Rheumatology

## 2019-08-22 NOTE — Telephone Encounter (Signed)
Last visit: 04/08/19 Next Visit: 09/30/19  Current Dose per office note on 04/07/2019: gabapentin 300 mg 2 capsules TID   Okay to refill per Dr. Estanislado Pandy

## 2019-08-26 ENCOUNTER — Other Ambulatory Visit: Payer: Self-pay | Admitting: Rheumatology

## 2019-08-27 NOTE — Telephone Encounter (Signed)
Last visit: 04/07/19 Next Visit: 09/30/19 Labs: 1/26/21Uric acid 5.5CBC WNL  Okay to refill per Dr. Estanislado Pandy

## 2019-09-03 ENCOUNTER — Ambulatory Visit
Admission: RE | Admit: 2019-09-03 | Discharge: 2019-09-03 | Disposition: A | Discharge status: Home / Self Care | Payer: Medicare Other | Source: Ambulatory Visit | Attending: Orthopedic Surgery | Admitting: Orthopedic Surgery

## 2019-09-03 DIAGNOSIS — M19012 Primary osteoarthritis, left shoulder: Secondary | ICD-10-CM

## 2019-09-08 ENCOUNTER — Encounter (HOSPITAL_COMMUNITY): Payer: Self-pay

## 2019-09-08 NOTE — Patient Instructions (Addendum)
DUE TO COVID-19 ONLY ONE VISITOR ARE ALLOWED TO COME WITH YOU AND STAY IN THE WAITING ROOM ONLY DURING PRE OP AND PROCEDURE. THEN TWO VISITORS MAY VISIT WITH YOU IN YOUR PRIVATE ROOM DURING VISITING HOURS ONLY!!   COVID SWAB TESTING MUST BE COMPLETED ON:  Monday, September 15, 2019  at 10:15 AM [address], [place] -[facility] enter pre surgical testing line (Must self quarantine after testing. Follow instructions on handout.)         Your procedure is scheduled on: Thursday, September 18, 2019   Report to [facility] Main  Entrance    Report to admitting at 10:00  AM   Call this number if you have problems the morning of surgery [phone]   Do not eat food:After Midnight.   May have liquids until 9:30 AM   day of surgery   CLEAR LIQUID DIET  Foods Allowed                                                                     Foods Excluded  Water, Black Coffee and tea, regular and decaf                             liquids that you cannot  Plain Jell-O in any flavor  (No red)                                           see through such as: Fruit ices (not with fruit pulp)                                     milk, soups, orange juice  Iced Popsicles (No red)                                    All solid food                                   Apple juices Sports drinks like Gatorade (No red) Lightly seasoned clear broth or consume(fat free) Sugar, honey syrup  Sample Menu Breakfast                                Lunch                                     Supper Cranberry juice                    Beef broth                            Chicken broth Jell-O  Grape juice                           Apple juice Coffee or tea                        Jell-O                                      Popsicle                                                Coffee or tea                        Coffee or tea   Complete one G2 drink the morning  of surgery at   9:30 AM   the day of surgery.     Oral Hygiene is also important to reduce your risk of infection.                                    Remember - BRUSH YOUR TEETH THE MORNING OF SURGERY WITH YOUR REGULAR TOOTHPASTE   Do NOT smoke after Midnight   Take these medicines the morning of surgery with A SIP OF WATER: Allopurinol, Atenolol, Gabapentin, Omeprazole, Venlafaxine   May use Flonase day of surgery  DO NOT TAKE ANY ORAL DIABETIC MEDICATIONS DAY OF YOUR SURGERY                               You may not have any metal on your body including  jewelry, and body piercings             Do not wear lotions, powders, perfumes/cologne, or deodorant                           Men may shave face and neck.   Do not bring valuables to the hospital. [place].   Contacts, dentures or bridgework may not be worn into surgery.   Bring small overnight bag day of surgery.    Special Instructions: Bring a copy of your healthcare power of attorney and living will documents         the day of surgery if you haven't scanned them in before.              Please read over the following fact sheets you were given: IF YOU HAVE QUESTIONS ABOUT YOUR PRE OP [place] [phone]  How to Manage Your Diabetes Before and After Surgery  Why is it important to control my blood sugar before and after surgery? . Improving blood sugar levels before and after surgery helps healing and can limit problems. . A way of improving blood sugar control is eating a healthy diet by: o  Eating less sugar and carbohydrates o  Increasing activity/exercise o  Talking with your doctor about reaching your blood sugar goals . High  blood sugars (greater than 180 mg/dL) can raise your risk of infections and slow your recovery, so you will need to focus on controlling your diabetes during the weeks before surgery. . Make sure that the doctor who takes care  of your diabetes knows about your planned surgery including the date and location.  How do I manage my blood sugar before surgery? . Check your blood sugar at least 4 times a day, starting 2 days before surgery, to make sure that the level is not too high or low. o Check your blood sugar the morning of your surgery when you wake up and every 2 hours until you get to the Short Stay unit. . If your blood sugar is less than 70 mg/dL, you will need to treat for low blood sugar: o Do not take insulin. o Treat a low blood sugar (less than 70 mg/dL) with  cup of clear juice (cranberry or apple), 4 glucose tablets, OR glucose gel. o Recheck blood sugar in 15 minutes after treatment (to make sure it is greater than 70 mg/dL). If your blood sugar is not greater than 70 mg/dL on recheck, call [phone] for further instructions. . Report your blood sugar to the short stay nurse when you get to Short Stay.  . If you are admitted to the hospital after surgery: o Your blood sugar will be checked by the staff and you will probably be given insulin after surgery (instead of oral diabetes medicines) to make sure you have good blood sugar levels. o The goal for blood sugar control after surgery is 80-180 mg/dL.   WHAT DO I DO ABOUT MY DIABETES MEDICATION?  Marland Kitchen Do not take oral diabetes medicines (pills) the morning of surgery.  Reviewed and Endorsed by [facility] Patient Education Committee, August 2015[facility]- Preparing for Total Shoulder Arthroplasty    Before surgery, you can play an important role. Because skin is not sterile, your skin needs to be as free of germs as possible. You can reduce the number of germs on your skin by using the following products. . Benzoyl Peroxide Gel o Reduces the number of germs present on the skin o Applied twice a day to shoulder area starting two days before surgery    ==================================================================  Please follow these  instructions carefully:  BENZOYL PEROXIDE 5% GEL  Please do not use if you have an allergy to benzoyl peroxide.   If your skin becomes reddened/irritated stop using the benzoyl peroxide.  Starting two days before surgery, apply as follows: (Tuesday and Wednesday) 1. Apply benzoyl peroxide in the morning and at night. Apply after taking a shower. If you are not taking a shower clean entire shoulder front, back, and side along with the armpit with a clean wet washcloth.  2. Place a quarter-sized dollop on your shoulder and rub in thoroughly, making sure to cover the front, back, and side of your shoulder, along with the armpit.   2 days before ____ AM   ____ PM              1 day before ____ AM   ____ PM                         3. Do this twice a day for two days.  (Last application is the night before surgery, AFTER using the CHG soap as described below).  4. Do NOT apply benzoyl peroxide gel on the day of surgery.  [facility] - Preparing for Surgery Before surgery, you can play an important role.  Because skin is not sterile, your skin needs to be as free of germs as possible.  You can reduce the number of germs on your skin by washing with CHG (chlorahexidine gluconate) soap before surgery.  CHG is an antiseptic cleaner which kills germs and bonds with the skin to continue killing germs even after washing. Please DO NOT use if you have an allergy to CHG or antibacterial soaps.  If your skin becomes reddened/irritated stop using the CHG and inform your nurse when you arrive at Short Stay. Do not shave (including legs and underarms) for at least 48 hours prior to the first CHG shower.  You may shave your face/neck.  Please follow these instructions carefully:  1.  Shower with CHG Soap the night before surgery and the  morning of surgery.  2.  If you choose to wash your hair, wash your hair first as usual with your normal  shampoo.  3.  After you shampoo, rinse your hair and body  thoroughly to remove the shampoo.                             4.  Use CHG as you would any other liquid soap.  You can apply chg directly to the skin and wash.  Gently with a scrungie or clean washcloth.  5.  Apply the CHG Soap to your body ONLY FROM THE NECK DOWN.   Do   not use on face/ open                           Wound or open sores. Avoid contact with eyes, ears mouth and   genitals (private parts).                       Wash face,  Genitals (private parts) with your normal soap.             6.  Wash thoroughly, paying special attention to the area where your    surgery  will be performed.  7.  Thoroughly rinse your body with warm water from the neck down.  8.  DO NOT shower/wash with your normal soap after using and rinsing off the CHG Soap.                9.  Pat yourself dry with a clean towel.            10.  Wear clean pajamas.            11.  Place clean sheets on your bed the night of your first shower and do not  sleep with pets. Day of Surgery : Do not apply any lotions/deodorants the morning of surgery.  Please wear clean clothes to the hospital/surgery center.  FAILURE TO FOLLOW THESE INSTRUCTIONS MAY RESULT IN THE CANCELLATION OF YOUR SURGERY  PATIENT SIGNATURE_________________________________  NURSE SIGNATURE__________________________________  ________________________________________________________________________   Victor [name]  An incentive spirometer is a tool that can help keep your lungs clear and active. This tool measures how well you are filling your lungs with each breath. Taking long deep breaths may help reverse or decrease the chance of developing breathing (pulmonary) problems (especially infection) following:  A long period of time when you are unable to move or be active. BEFORE THE PROCEDURE  If the spirometer includes an indicator to show your best effort, your nurse or respiratory therapist will set it to a desired goal.  If possible,  sit up straight or lean slightly forward. Try not to slouch.  Hold the incentive spirometer in an upright position. INSTRUCTIONS FOR USE  1. Sit on the edge of your bed if possible, or sit up as far as you can in bed or on a chair. 2. Hold the incentive spirometer in an upright position. 3. Breathe out normally. 4. Place the mouthpiece in your mouth and seal your lips tightly around it. 5. Breathe in slowly and as deeply as possible, raising the piston or the ball toward the top of the column. 6. Hold your breath for 3-5 seconds or for as long as possible. Allow the piston or ball to fall to the bottom of the column. 7. Remove the mouthpiece from your mouth and breathe out normally. 8. Rest for a few seconds and repeat Steps 1 through 7 at least 10 times every 1-2 hours when you are awake. Take your time and take a few normal breaths between deep breaths. 9. The spirometer may include an indicator to show your best effort. Use the indicator as a goal to work toward during each repetition. 10. After each set of 10 deep breaths, practice coughing to be sure your lungs are clear. If you have an incision (the cut made at the time of surgery), support your incision when coughing by placing a pillow or rolled up towels firmly against it. Once you are able to get out of bed, walk around indoors and cough well. You may stop using the incentive spirometer when instructed by your caregiver.  RISKS AND COMPLICATIONS  Take your time so you do not get dizzy or light-headed.  If you are in pain, you may need to take or ask for pain medication before doing incentive spirometry. It is harder to take a deep breath if you are having pain. AFTER USE  Rest and breathe slowly and easily.  It can be helpful to keep track of a log of your progress. Your caregiver can provide you with a simple table to help with this. If you are using the spirometer at home, follow these instructions: [place] IF:   You  are having difficultly using the spirometer.  You have trouble using the spirometer as often as instructed.  Your pain medication is not giving enough relief while using the spirometer.  You develop fever of 100.5 F (38.1 C) or higher. SEEK IMMEDIATE MEDICAL CARE IF:   You cough up bloody sputum that had not been present before.  You develop fever of 102 F (38.9 C) or greater.  You develop worsening pain at or near the incision site. MAKE SURE YOU:   Understand these instructions.  Will watch your condition.  Will get help right away if you are not doing well or get worse. Document Released: 06/26/2006 Document Revised: 05/08/2011 Document Reviewed: 08/27/2006 [facility] Patient Information 2014 [place], [place].   ________________________________________________________________________

## 2019-09-08 NOTE — Progress Notes (Addendum)
COVID Vaccine Completed: Yes Date COVID Vaccine completed: 05/2019 COVID vaccine manufacturer: Pfizer      PCP - Dr. Iline Oven Cardiologist - Dr. Johnsie Cancel  Chest x-ray - greater than 1 year EKG - 02/2019 received and placed in chart Stress Test - greater than 2 years ECHO - N/A Cardiac Cath - N/A  Sleep Study - Yes CPAP - does not use CPAP  Fasting Blood Sugar - does not check at home Checks Blood Sugar __does not check at home___  Blood Thinner Instructions: N/A Aspirin Instructions: N/A Last Dose: N/A  Anesthesia review: HTN, OSA  Patient denies shortness of breath, fever, cough and chest pain at PAT appointment   Patient verbalized understanding of instructions that were given to them at the PAT appointment. Patient was also instructed that they will need to review over the PAT instructions again at home before surgery.

## 2019-09-09 ENCOUNTER — Encounter (HOSPITAL_COMMUNITY): Payer: Self-pay

## 2019-09-09 ENCOUNTER — Encounter (HOSPITAL_COMMUNITY)
Admission: RE | Admit: 2019-09-09 | Discharge: 2019-09-09 | Disposition: A | Discharge status: Home / Self Care | Payer: Medicare Other | Source: Ambulatory Visit | Attending: Orthopedic Surgery | Admitting: Orthopedic Surgery

## 2019-09-09 ENCOUNTER — Other Ambulatory Visit: Payer: Self-pay

## 2019-09-09 DIAGNOSIS — Z01812 Encounter for preprocedural laboratory examination: Secondary | ICD-10-CM | POA: Diagnosis present

## 2019-09-09 HISTORY — DX: Cramp and spasm: R25.2

## 2019-09-09 HISTORY — DX: Other chronic pain: G89.29

## 2019-09-09 HISTORY — DX: Headache, unspecified: R51.9

## 2019-09-09 HISTORY — DX: Personal history of other diseases of the respiratory system: Z87.09

## 2019-09-09 HISTORY — DX: Malignant melanoma of skin, unspecified: C43.9

## 2019-09-09 HISTORY — DX: Methicillin resistant Staphylococcus aureus infection, unspecified site: A49.02

## 2019-09-09 HISTORY — DX: Unspecified mononeuropathy of bilateral lower limbs: G57.93

## 2019-09-09 LAB — CBC
HCT: 50.8 % (ref 39.0–52.0)
Hemoglobin: 17.4 g/dL — ABNORMAL HIGH (ref 13.0–17.0)
MCH: 33.1 pg (ref 26.0–34.0)
MCHC: 34.3 g/dL (ref 30.0–36.0)
MCV: 96.6 fL (ref 80.0–100.0)
Platelets: 192 10*3/uL (ref 150–400)
RBC: 5.26 MIL/uL (ref 4.22–5.81)
RDW: 13.4 % (ref 11.5–15.5)
WBC: 5.4 10*3/uL (ref 4.0–10.5)
nRBC: 0 % (ref 0.0–0.2)

## 2019-09-09 LAB — BASIC METABOLIC PANEL
Anion gap: 10 (ref 5–15)
BUN: 18 mg/dL (ref 8–23)
CO2: 26 mmol/L (ref 22–32)
Calcium: 9.7 mg/dL (ref 8.9–10.3)
Chloride: 102 mmol/L (ref 98–111)
Creatinine, Ser: 1.05 mg/dL (ref 0.61–1.24)
GFR calc Af Amer: >60 mL/min (ref >60)
GFR calc non Af Amer: >60 mL/min (ref >60)
Glucose, Bld: 143 mg/dL — ABNORMAL HIGH (ref 70–99)
Potassium: 5.2 mmol/L — ABNORMAL HIGH (ref 3.5–5.1)
Sodium: 138 mmol/L (ref 135–145)

## 2019-09-09 LAB — HEMOGLOBIN A1C
Hgb A1c MFr Bld: 5.5 % (ref 4.8–5.6)
Mean Plasma Glucose: 111.15 mg/dL

## 2019-09-09 LAB — SURGICAL PCR SCREEN
MRSA, PCR: NEGATIVE
Staphylococcus aureus: POSITIVE — AB

## 2019-09-09 LAB — GLUCOSE, CAPILLARY: Glucose-Capillary: 145 mg/dL — ABNORMAL HIGH (ref 70–99)

## 2019-09-10 NOTE — Progress Notes (Signed)
Anesthesia Chart Review:   Case: [id number] Date/Time: 09/18/19 1215   Procedure: TOTAL SHOULDER ARTHROPLASTY (Left Shoulder) - 189min   Anesthesia type: General   Pre-op diagnosis: Left shoulder osteoarthritis   [facility] ROOM 06 / [facility]   Surgeons: Justice Britain, MD       DISCUSSION:  Pt is a 64 year old with hx HTN, OSA, pre-diabetes  BP 156/105 at pre-surgical testing. I spoke with pt by telephone.  His BP at home has been ~150s/90's, occasionally diastolic is in 737'T.  Office visit note from Dr. Moreen Fowler 03/25/19 documents BP 180/118; at this visit, increasing anti-HTN meds was recommended but pt elected to work on diet at home first.  Today in my phone call with pt, he reports he will call Dr. Laqueta Linden office and let him know BP continues to be increased.  If Dr. Moreen Fowler adds new anti-HTN med, pt will call pre-surgical testing with name of med to get pre-op instructions for taking it.    VS: BP (!) 156/105   Pulse (!) 52   Temp 36.8 C (Oral)   Resp 16   Ht 5\' 8"  (1.727 m)   Wt 101.4 kg   SpO2 99%   BMI 34.00 kg/m   PROVIDERS: - PCP is Antony Contras, MD   LABS: Labs reviewed: Acceptable for surgery. (all labs ordered are listed, but only abnormal results are displayed)  Labs Reviewed  SURGICAL PCR SCREEN - Abnormal; Notable for the following components:      Result Value   Staphylococcus aureus POSITIVE (*)    All other components within normal limits  BASIC METABOLIC PANEL - Abnormal; Notable for the following components:   Potassium 5.2 (*)    Glucose, Bld 143 (*)    All other components within normal limits  CBC - Abnormal; Notable for the following components:   Hemoglobin 17.4 (*)    All other components within normal limits  GLUCOSE, CAPILLARY - Abnormal; Notable for the following components:   Glucose-Capillary 145 (*)    All other components within normal limits  HEMOGLOBIN A1C    EKG 03/25/19 (Dr. Laqueta Linden office): sinus bradycardia. 1st degree AV  block     Past Medical History:  Diagnosis Date   Anxiety    Arthritis    "all over" (04/15/2013)   Back pain, chronic    Chronic neck pain    DDD (degenerative disc disease), cervical    DDD (degenerative disc disease), lumbar    Fibromyalgia    GERD (gastroesophageal reflux disease)    Gout    Headache    High cholesterol    History of bronchitis    Hypertension    Leg cramps    Right greater than left   Melanoma (HCC)    Nose   MRSA infection    Neuropathy of both feet    Pre-diabetes    Radiculopathy of lumbar region    Sleep apnea    "don't use CPAP" (04/15/2013)    Past Surgical History:  Procedure Laterality Date   ANTERIOR CERVICAL DECOMP/DISCECTOMY FUSION  2000's X3   "had 3 surgeries on my front neck" (04/15/2013)   ANTERIOR LAT LUMBAR FUSION Left 08/06/2017   Procedure: LEFT LUMBAR TWO-THREE ANTERIOR LATERAL LUMBAR FUSION WITH PERCUTANEOUS SCREWS;  Surgeon: Earnie Larsson, MD;  Location: [facility];  Service: Neurosurgery;  Laterality: Left;   APPENDECTOMY  ~ 2010   CARPAL TUNNEL RELEASE Left 1990's   CATARACT EXTRACTION, BILATERAL     COLONOSCOPY  ear drum surgery     FLEXIBLE SIGMOIDOSCOPY  12/21/2011   Procedure: FLEXIBLE SIGMOIDOSCOPY;  Surgeon: Lear Ng, MD;  [facility];  Service: Endoscopy;  Laterality: N/A;   HAND TENDON SURGERY Left ~ 1978 X2-1980's X3   "had 5 ORs after laceration"   HOT HEMOSTASIS  12/21/2011   Procedure: HOT HEMOSTASIS (ARGON PLASMA COAGULATION/BICAP);  Surgeon: Lear Ng, MD;  [facility];  Service: Endoscopy;  Laterality: N/A;   INGUINAL HERNIA REPAIR Left 04/15/2013   Procedure: LAPAROSCOPIC REPAIR OF INCARCERATED LEFT INGUINAL HERNIA;  Surgeon: Gayland Curry, MD;  Location: [facility];  Service: General;  Laterality: Left;   INGUINAL HERNIA REPAIR Bilateral 1970's - [place] Left 04/15/2013   INSERTION OF MESH Left 04/15/2013   Procedure: INSERTION OF MESH;  Surgeon: Gayland Curry, MD;  Location: [facility];  Service: General;  Laterality: Left;   KNEE ARTHROSCOPY Right 1990's   LAPAROSCOPY N/A 04/15/2013   Procedure: LAPAROSCOPY DIAGNOSTIC;  Surgeon: Gayland Curry, MD;  Location: [facility];  Service: General;  Laterality: N/A;   LUMBAR LAMINECTOMY/DECOMPRESSION MICRODISCECTOMY Right 07/22/2015   Procedure: Right Lumbar Two-Three Microdiscectomy;  Surgeon: Earnie Larsson, MD;  [facility];  Service: Neurosurgery;  Laterality: Right;   LUMBAR LAMINECTOMY/DECOMPRESSION MICRODISCECTOMY Right 09/05/2016   Procedure: Microdiscectomy - right - T12-L1;  Surgeon: Earnie Larsson, MD;  Location: [facility];  Service: Neurosurgery;  Laterality: Right;   NASAL SEPTUM SURGERY Bilateral 2014   "removed polyps & infection"   POLYPECTOMY  01/10/2011   Procedure: POLYPECTOMY;  Surgeon: Lear Ng, MD;  [facility];  Service: Endoscopy;  Laterality: N/A;   POSTERIOR LUMBAR FUSION  11/1995   "ray cages" (04/15/2013)   SHOULDER ARTHROSCOPY Bilateral    "twice each"   TONSILLECTOMY  1960's   TOTAL KNEE ARTHROPLASTY Right 02/24/2014   Procedure: TOTAL RIGHT KNEE ARTHROPLASTY;  Surgeon: Mauri Pole, MD;  [facility];  Service: Orthopedics;  Laterality: Right;   UPPER GI ENDOSCOPY     VASECTOMY  1988   WISDOM TOOTH EXTRACTION  2000's    MEDICATIONS:  acetaminophen (TYLENOL) 500 MG tablet   allopurinol (ZYLOPRIM) 300 MG tablet   ALPRAZolam (XANAX) 1 MG tablet   atenolol (TENORMIN) 100 MG tablet   b complex vitamins tablet   Biotin 5000 MCG CAPS   Calcium Carbonate (CALCIUM 600 PO)   clobetasol ointment (TEMOVATE) 0.05 %   colchicine 0.6 MG tablet   diazepam (VALIUM) 5 MG tablet   fish oil-omega-3 fatty acids 1000 MG capsule   fluticasone (FLONASE) 50 MCG/ACT nasal spray   gabapentin (NEURONTIN) 300 MG capsule   GINSENG PO   guaiFENesin (MUCINEX) 600 MG 12 hr tablet   hydrocortisone cream 1 %   ibuprofen (ADVIL,MOTRIN) 200 MG tablet   lisinopril  (PRINIVIL,ZESTRIL) 5 MG tablet   Melatonin 10 MG TABS   Methylsulfonylmethane (MSM) 1000 MG CAPS   Multiple Vitamins-Minerals (MULTIVITAMIN WITH MINERALS) tablet   NONFORMULARY OR COMPOUNDED ITEM   omeprazole (PRILOSEC) 40 MG capsule   Oxycodone HCl 10 MG TABS   Psyllium (METAMUCIL FIBER PO)   RAPAFLO 8 MG CAPS capsule   sodium chloride (OCEAN) 0.65 % SOLN nasal spray   testosterone cypionate (DEPOTESTOTERONE CYPIONATE) 200 MG/ML injection   TURMERIC PO   venlafaxine (EFFEXOR-XR) 150 MG 24 hr capsule   vitamin E 400 UNIT capsule   No current facility-administered medications for this encounter.    If BP acceptable  day of surgery, I anticipate pt can proceed with surgery as scheduled.  Willeen Cass, PhD, FNP-BC [facility]/Anesthesiology Phone: [phone] 09/11/2019 3:32 PM

## 2019-09-15 ENCOUNTER — Other Ambulatory Visit (HOSPITAL_COMMUNITY)
Admission: RE | Admit: 2019-09-15 | Discharge: 2019-09-15 | Disposition: A | Discharge status: Home / Self Care | Payer: Medicare Other | Source: Ambulatory Visit | Attending: Orthopedic Surgery | Admitting: Orthopedic Surgery

## 2019-09-15 DIAGNOSIS — Z01812 Encounter for preprocedural laboratory examination: Secondary | ICD-10-CM | POA: Diagnosis present

## 2019-09-15 DIAGNOSIS — Z20822 Contact with and (suspected) exposure to covid-19: Secondary | ICD-10-CM | POA: Diagnosis not present

## 2019-09-15 LAB — SARS CORONAVIRUS 2 (TAT 6-24 HRS): SARS Coronavirus 2: NEGATIVE

## 2019-09-17 NOTE — Anesthesia Preprocedure Evaluation (Addendum)
Anesthesia Evaluation  Patient identified by MRN, date of birth, ID band Patient awake    Reviewed: Allergy & Precautions, NPO status , Patient's Chart, lab work & pertinent test results  Airway Mallampati: II  TM Distance: >3 FB Neck ROM: Full    Dental no notable dental hx. (+) Implants, Caps, Dental Advisory Given, Teeth Intact   Pulmonary sleep apnea , former smoker,    Pulmonary exam normal breath sounds clear to auscultation       Cardiovascular hypertension, Pt. on medications and Pt. on home beta blockers Normal cardiovascular exam Rhythm:Regular Rate:Normal     Neuro/Psych Anxiety negative neurological ROS  negative psych ROS   GI/Hepatic Neg liver ROS, GERD  Medicated and Controlled,  Endo/Other  negative endocrine ROS  Renal/GU negative Renal ROS     Musculoskeletal  (+) Arthritis , Fibromyalgia -  Abdominal (+) + obese,   Peds  Hematology   Anesthesia Other Findings   Reproductive/Obstetrics                            Anesthesia Physical Anesthesia Plan  ASA: II  Anesthesia Plan: General   Post-op Pain Management:  Regional for Post-op pain   Induction: Intravenous  PONV Risk Score and Plan: 3 and Midazolam, Treatment may vary due to age or medical condition, Ondansetron and Dexamethasone  Airway Management Planned: Oral ETT  Additional Equipment: None  Intra-op Plan:   Post-operative Plan: Extubation in OR  Informed Consent: I have reviewed the patients History and Physical, chart, labs and discussed the procedure including the risks, benefits and alternatives for the proposed anesthesia with the patient or authorized representative who has indicated his/her understanding and acceptance.     Dental advisory given  Plan Discussed with: CRNA and Surgeon  Anesthesia Plan Comments: (GA w L ISB)       Anesthesia Quick Evaluation

## 2019-09-18 ENCOUNTER — Ambulatory Visit (HOSPITAL_COMMUNITY): Payer: Medicare Other | Admitting: Anesthesiology

## 2019-09-18 ENCOUNTER — Encounter (HOSPITAL_COMMUNITY): Payer: Self-pay | Admitting: Orthopedic Surgery

## 2019-09-18 ENCOUNTER — Other Ambulatory Visit: Payer: Self-pay

## 2019-09-18 ENCOUNTER — Ambulatory Visit (HOSPITAL_COMMUNITY)
Admission: RE | Admit: 2019-09-18 | Discharge: 2019-09-18 | Disposition: A | Discharge status: Home / Self Care | Payer: Medicare Other | Source: Ambulatory Visit | Attending: Orthopedic Surgery | Admitting: Orthopedic Surgery

## 2019-09-18 ENCOUNTER — Ambulatory Visit (HOSPITAL_COMMUNITY): Payer: Medicare Other | Admitting: Emergency Medicine

## 2019-09-18 ENCOUNTER — Encounter (HOSPITAL_COMMUNITY)
Admission: RE | Disposition: A | Discharge status: Home / Self Care | Payer: Self-pay | Source: Ambulatory Visit | Attending: Orthopedic Surgery

## 2019-09-18 DIAGNOSIS — E785 Hyperlipidemia, unspecified: Secondary | ICD-10-CM | POA: Insufficient documentation

## 2019-09-18 DIAGNOSIS — E7800 Pure hypercholesterolemia, unspecified: Secondary | ICD-10-CM | POA: Insufficient documentation

## 2019-09-18 DIAGNOSIS — Z8614 Personal history of Methicillin resistant Staphylococcus aureus infection: Secondary | ICD-10-CM | POA: Diagnosis not present

## 2019-09-18 DIAGNOSIS — Z85820 Personal history of malignant melanoma of skin: Secondary | ICD-10-CM | POA: Insufficient documentation

## 2019-09-18 DIAGNOSIS — Z79899 Other long term (current) drug therapy: Secondary | ICD-10-CM | POA: Insufficient documentation

## 2019-09-18 DIAGNOSIS — Z981 Arthrodesis status: Secondary | ICD-10-CM | POA: Diagnosis not present

## 2019-09-18 DIAGNOSIS — Z6834 Body mass index (BMI) 34.0-34.9, adult: Secondary | ICD-10-CM | POA: Insufficient documentation

## 2019-09-18 DIAGNOSIS — N189 Chronic kidney disease, unspecified: Secondary | ICD-10-CM | POA: Insufficient documentation

## 2019-09-18 DIAGNOSIS — M5030 Other cervical disc degeneration, unspecified cervical region: Secondary | ICD-10-CM | POA: Insufficient documentation

## 2019-09-18 DIAGNOSIS — I10 Essential (primary) hypertension: Secondary | ICD-10-CM | POA: Diagnosis not present

## 2019-09-18 DIAGNOSIS — Z87891 Personal history of nicotine dependence: Secondary | ICD-10-CM | POA: Diagnosis not present

## 2019-09-18 DIAGNOSIS — M109 Gout, unspecified: Secondary | ICD-10-CM | POA: Insufficient documentation

## 2019-09-18 DIAGNOSIS — Z79890 Hormone replacement therapy (postmenopausal): Secondary | ICD-10-CM | POA: Insufficient documentation

## 2019-09-18 DIAGNOSIS — Z8249 Family history of ischemic heart disease and other diseases of the circulatory system: Secondary | ICD-10-CM | POA: Insufficient documentation

## 2019-09-18 DIAGNOSIS — M797 Fibromyalgia: Secondary | ICD-10-CM | POA: Diagnosis not present

## 2019-09-18 DIAGNOSIS — F419 Anxiety disorder, unspecified: Secondary | ICD-10-CM | POA: Diagnosis not present

## 2019-09-18 DIAGNOSIS — K219 Gastro-esophageal reflux disease without esophagitis: Secondary | ICD-10-CM | POA: Insufficient documentation

## 2019-09-18 DIAGNOSIS — M19012 Primary osteoarthritis, left shoulder: Secondary | ICD-10-CM | POA: Insufficient documentation

## 2019-09-18 DIAGNOSIS — G8929 Other chronic pain: Secondary | ICD-10-CM | POA: Insufficient documentation

## 2019-09-18 DIAGNOSIS — M5136 Other intervertebral disc degeneration, lumbar region: Secondary | ICD-10-CM | POA: Insufficient documentation

## 2019-09-18 DIAGNOSIS — G4730 Sleep apnea, unspecified: Secondary | ICD-10-CM | POA: Insufficient documentation

## 2019-09-18 HISTORY — PX: TOTAL SHOULDER ARTHROPLASTY: SHX126

## 2019-09-18 LAB — TYPE AND SCREEN
ABO/RH(D): A POS
Antibody Screen: NEGATIVE

## 2019-09-18 LAB — GLUCOSE, CAPILLARY: Glucose-Capillary: 156 mg/dL — ABNORMAL HIGH (ref 70–99)

## 2019-09-18 SURGERY — ARTHROPLASTY, SHOULDER, TOTAL
Anesthesia: General | Site: Shoulder | Laterality: Left

## 2019-09-18 MED ORDER — CYCLOBENZAPRINE HCL 10 MG PO TABS
10.0000 mg | ORAL_TABLET | Freq: Three times a day (TID) | ORAL | 1 refills | Status: DC | PRN
Start: 2019-09-18 — End: 2020-05-04

## 2019-09-18 MED ORDER — DEXAMETHASONE SODIUM PHOSPHATE 10 MG/ML IJ SOLN
INTRAMUSCULAR | Status: DC | PRN
  Administered 2019-09-18: 10 mg via INTRAVENOUS

## 2019-09-18 MED ORDER — MIDAZOLAM HCL 2 MG/2ML IJ SOLN
1.0000 mg | INTRAMUSCULAR | Status: DC
  Administered 2019-09-18: 2 mg via INTRAVENOUS
  Filled 2019-09-18: qty 2

## 2019-09-18 MED ORDER — ROCURONIUM BROMIDE 10 MG/ML (PF) SYRINGE
PREFILLED_SYRINGE | INTRAVENOUS | Status: DC | PRN
  Administered 2019-09-18: 20 mg via INTRAVENOUS
  Administered 2019-09-18: 10 mg via INTRAVENOUS
  Administered 2019-09-18: 50 mg via INTRAVENOUS
  Administered 2019-09-18: 10 mg via INTRAVENOUS

## 2019-09-18 MED ORDER — STERILE WATER FOR IRRIGATION IR SOLN
Status: DC | PRN
  Administered 2019-09-18: 2000 mL

## 2019-09-18 MED ORDER — CEFAZOLIN SODIUM-DEXTROSE 2-4 GM/100ML-% IV SOLN
2.0000 g | INTRAVENOUS | Status: AC
  Administered 2019-09-18: 2 g via INTRAVENOUS
  Filled 2019-09-18: qty 100

## 2019-09-18 MED ORDER — OXYCODONE-ACETAMINOPHEN 10-325 MG PO TABS: 1.0000 | ORAL_TABLET | Freq: Four times a day (QID) | ORAL | 0 refills | Status: AC | PRN

## 2019-09-18 MED ORDER — PHENYLEPHRINE HCL-NACL 10-0.9 MG/250ML-% IV SOLN
INTRAVENOUS | Status: DC | PRN
Start: 2019-09-18 — End: 2019-09-18
  Administered 2019-09-18: 65 ug/min via INTRAVENOUS

## 2019-09-18 MED ORDER — ONDANSETRON HCL 4 MG PO TABS: 4.0000 mg | ORAL_TABLET | Freq: Three times a day (TID) | ORAL | 0 refills | Status: DC | PRN

## 2019-09-18 MED ORDER — PROPOFOL 10 MG/ML IV BOLUS
INTRAVENOUS | Status: DC | PRN
  Administered 2019-09-18: 150 mg via INTRAVENOUS

## 2019-09-18 MED ORDER — 0.9 % SODIUM CHLORIDE (POUR BTL) OPTIME
TOPICAL | Status: DC | PRN
  Administered 2019-09-18: 1000 mL

## 2019-09-18 MED ORDER — LACTATED RINGERS IV SOLN: INTRAVENOUS | Status: DC

## 2019-09-18 MED ORDER — HYDROMORPHONE HCL 1 MG/ML IJ SOLN: 0.2500 mg | INTRAMUSCULAR | Status: DC | PRN

## 2019-09-18 MED ORDER — SUGAMMADEX SODIUM 200 MG/2ML IV SOLN
INTRAVENOUS | Status: DC | PRN
  Administered 2019-09-18: 200 mg via INTRAVENOUS

## 2019-09-18 MED ORDER — TRANEXAMIC ACID-NACL 1000-0.7 MG/100ML-% IV SOLN
1000.0000 mg | INTRAVENOUS | Status: AC
  Administered 2019-09-18: 1000 mg via INTRAVENOUS
  Filled 2019-09-18: qty 100

## 2019-09-18 MED ORDER — PHENYLEPHRINE 40 MCG/ML (10ML) SYRINGE FOR IV PUSH (FOR BLOOD PRESSURE SUPPORT)
PREFILLED_SYRINGE | INTRAVENOUS | Status: DC | PRN
  Administered 2019-09-18: 40 ug via INTRAVENOUS

## 2019-09-18 MED ORDER — EPHEDRINE SULFATE-NACL 50-0.9 MG/10ML-% IV SOSY
PREFILLED_SYRINGE | INTRAVENOUS | Status: DC | PRN
  Administered 2019-09-18 (×2): 10 mg via INTRAVENOUS

## 2019-09-18 MED ORDER — LIDOCAINE 2% (20 MG/ML) 5 ML SYRINGE
INTRAMUSCULAR | Status: DC | PRN
  Administered 2019-09-18: 100 mg via INTRAVENOUS

## 2019-09-18 MED ORDER — ORAL CARE MOUTH RINSE: 15.0000 mL | Freq: Once | OROMUCOSAL | Status: AC

## 2019-09-18 MED ORDER — FENTANYL CITRATE (PF) 100 MCG/2ML IJ SOLN
50.0000 ug | INTRAMUSCULAR | Status: DC
  Administered 2019-09-18: 100 ug via INTRAVENOUS
  Filled 2019-09-18: qty 2

## 2019-09-18 MED ORDER — LACTATED RINGERS IV BOLUS
500.0000 mL | Freq: Once | INTRAVENOUS | Status: AC
  Administered 2019-09-18: 500 mL via INTRAVENOUS

## 2019-09-18 MED ORDER — OXYCODONE HCL 5 MG PO TABS: 5.0000 mg | ORAL_TABLET | Freq: Once | ORAL | Status: DC | PRN

## 2019-09-18 MED ORDER — ACETAMINOPHEN 10 MG/ML IV SOLN: 1000.0000 mg | Freq: Once | INTRAVENOUS | Status: DC | PRN

## 2019-09-18 MED ORDER — OXYCODONE HCL 5 MG/5ML PO SOLN: 5.0000 mg | Freq: Once | ORAL | Status: DC | PRN

## 2019-09-18 MED ORDER — DROPERIDOL 2.5 MG/ML IJ SOLN: 0.6250 mg | Freq: Once | INTRAMUSCULAR | Status: DC | PRN

## 2019-09-18 MED ORDER — ONDANSETRON HCL 4 MG/2ML IJ SOLN: 4.0000 mg | Freq: Once | INTRAMUSCULAR | Status: DC | PRN

## 2019-09-18 MED ORDER — BUPIVACAINE LIPOSOME 1.3 % IJ SUSP
INTRAMUSCULAR | Status: DC | PRN
  Administered 2019-09-18: 10 mL via PERINEURAL

## 2019-09-18 MED ORDER — NAPROXEN 500 MG PO TABS
500.0000 mg | ORAL_TABLET | Freq: Two times a day (BID) | ORAL | 1 refills | Status: DC
Start: 2019-09-18 — End: 2020-05-04

## 2019-09-18 MED ORDER — ONDANSETRON HCL 4 MG/2ML IJ SOLN
INTRAMUSCULAR | Status: DC | PRN
  Administered 2019-09-18: 4 mg via INTRAVENOUS

## 2019-09-18 MED ORDER — BUPIVACAINE-EPINEPHRINE (PF) 0.5% -1:200000 IJ SOLN
INTRAMUSCULAR | Status: DC | PRN
  Administered 2019-09-18: 15 mL via PERINEURAL

## 2019-09-18 MED ORDER — LACTATED RINGERS IV BOLUS: 250.0000 mL | Freq: Once | INTRAVENOUS | Status: DC

## 2019-09-18 MED ORDER — DEXMEDETOMIDINE HCL IN NACL 200 MCG/50ML IV SOLN
INTRAVENOUS | Status: DC | PRN
Start: 2019-09-18 — End: 2019-09-18
  Administered 2019-09-18: 24 ug via INTRAVENOUS

## 2019-09-18 MED ORDER — CHLORHEXIDINE GLUCONATE 0.12 % MT SOLN
15.0000 mL | Freq: Once | OROMUCOSAL | Status: AC
  Administered 2019-09-18: 15 mL via OROMUCOSAL

## 2019-09-18 SURGICAL SUPPLY — 73 items
ADH SKN CLS APL DERMABOND .7 (GAUZE/BANDAGES/DRESSINGS) ×1
AID PSTN UNV HD RSTRNT DISP (MISCELLANEOUS) ×1
BAG SPEC THK2 15X12 ZIP CLS (MISCELLANEOUS) ×1
BAG ZIPLOCK 12X15 (MISCELLANEOUS) ×3 IMPLANT
BIT DRILL 2.0X128 (BIT) ×2 IMPLANT
BIT DRILL 2.0X128MM (BIT) ×1
BLADE SAW SGTL 83.5X18.5 (BLADE) ×3 IMPLANT
BODY HUM ECLIPSE TRUNION OD 51 (Shoulder) ×2 IMPLANT
CALIBRATOR GLENOID VIP 5-D (SYSTAGENIX WOUND MANAGEMENT) ×2 IMPLANT
CEMENT BONE DEPUY (Cement) ×3 IMPLANT
COOLER ICEMAN CLASSIC (MISCELLANEOUS) ×2 IMPLANT
COVER BACK TABLE 60X90IN (DRAPES) ×3 IMPLANT
COVER SURGICAL LIGHT HANDLE (MISCELLANEOUS) ×3 IMPLANT
COVER WAND RF STERILE (DRAPES) IMPLANT
DERMABOND ADVANCED (GAUZE/BANDAGES/DRESSINGS) ×2
DERMABOND ADVANCED .7 DNX12 (GAUZE/BANDAGES/DRESSINGS) ×1 IMPLANT
DRAPE ORTHO SPLIT 77X108 STRL (DRAPES) ×6
DRAPE SHEET LG 3/4 BI-LAMINATE (DRAPES) ×3 IMPLANT
DRAPE SURG 17X11 SM STRL (DRAPES) ×3 IMPLANT
DRAPE SURG ORHT 6 SPLT 77X108 (DRAPES) ×2 IMPLANT
DRAPE U-SHAPE 47X51 STRL (DRAPES) ×3 IMPLANT
DRSG AQUACEL AG ADV 3.5X 6 (GAUZE/BANDAGES/DRESSINGS) ×2 IMPLANT
DRSG AQUACEL AG ADV 3.5X10 (GAUZE/BANDAGES/DRESSINGS) ×3 IMPLANT
DURAPREP 26ML APPLICATOR (WOUND CARE) ×4 IMPLANT
ELECT BLADE TIP CTD 4 INCH (ELECTRODE) ×3 IMPLANT
ELECT REM PT RETURN 15FT ADLT (MISCELLANEOUS) ×3 IMPLANT
FACESHIELD WRAPAROUND (MASK) ×12 IMPLANT
FACESHIELD WRAPAROUND OR TEAM (MASK) ×4 IMPLANT
GLENDOID UNI VAULTLOCK EXLG (Shoulder) ×3 IMPLANT
GLENOID UNI VAULTLOCK EXLG (Shoulder) IMPLANT
GLOVE BIO SURGEON STRL SZ7.5 (GLOVE) ×3 IMPLANT
GLOVE BIO SURGEON STRL SZ8 (GLOVE) ×3 IMPLANT
GLOVE SS BIOGEL STRL SZ 7 (GLOVE) ×1 IMPLANT
GLOVE SS BIOGEL STRL SZ 7.5 (GLOVE) ×1 IMPLANT
GLOVE SUPERSENSE BIOGEL SZ 7 (GLOVE) ×2
GLOVE SUPERSENSE BIOGEL SZ 7.5 (GLOVE) ×2
GOWN STRL REUS W/TWL LRG LVL3 (GOWN DISPOSABLE) ×6 IMPLANT
HEAD HUM ECLIPSE 49X18 OD (Head) ×2 IMPLANT
IMPL ECLIPSE SPEEDCAP (Shoulder) IMPLANT
IMPLANT ECLIPSE SPEEDCAP (Shoulder) ×3 IMPLANT
KIT BASIN OR (CUSTOM PROCEDURE TRAY) ×3 IMPLANT
KIT TURNOVER KIT A (KITS) IMPLANT
MANIFOLD NEPTUNE II (INSTRUMENTS) ×3 IMPLANT
MARKER SKIN DUAL TIP RULER LAB (MISCELLANEOUS) ×3 IMPLANT
NDL TAPERED W/ NITINOL LOOP (MISCELLANEOUS) IMPLANT
NEEDLE TAPERED W/ NITINOL LOOP (MISCELLANEOUS) IMPLANT
NS IRRIG 1000ML POUR BTL (IV SOLUTION) ×3 IMPLANT
PACK SHOULDER (CUSTOM PROCEDURE TRAY) ×3 IMPLANT
PAD COLD SHLDR WRAP-ON (PAD) IMPLANT
PIN NITINOL TARGETER 2.8 (PIN) ×2 IMPLANT
PIN SET MODULAR GLENOID SYSTEM (PIN) ×2 IMPLANT
PROTECTOR NERVE ULNAR (MISCELLANEOUS) ×3 IMPLANT
RESTRAINT HEAD UNIVERSAL NS (MISCELLANEOUS) ×3 IMPLANT
SCREW SPINAL 40 F/CAGE LRG (Screw) ×2 IMPLANT
SIZER ECLIPSE CAGE SCREW (ORTHOPEDIC DISPOSABLE SUPPLIES) ×2 IMPLANT
SLING ARM FOAM STRAP LRG (SOFTGOODS) ×2 IMPLANT
SLING ARM FOAM STRAP MED (SOFTGOODS) IMPLANT
SMARTMIX MINI TOWER (MISCELLANEOUS)
SPONGE LAP 18X18 RF (DISPOSABLE) IMPLANT
SUCTION FRAZIER HANDLE 12FR (TUBING) ×3
SUCTION TUBE FRAZIER 12FR DISP (TUBING) ×1 IMPLANT
SUT FIBERWIRE #2 38 T-5 BLUE (SUTURE)
SUT MNCRL AB 3-0 PS2 18 (SUTURE) ×3 IMPLANT
SUT MON AB 2-0 CT1 36 (SUTURE) ×3 IMPLANT
SUT VIC AB 1 CT1 36 (SUTURE) ×9 IMPLANT
SUTURE FIBERWR #2 38 T-5 BLUE (SUTURE) IMPLANT
SUTURE TAPE 1.3 40 TPR END (SUTURE) IMPLANT
SUTURETAPE 1.3 40 TPR END (SUTURE)
TOWEL OR 17X26 10 PK STRL BLUE (TOWEL DISPOSABLE) ×3 IMPLANT
TOWEL OR NON WOVEN STRL DISP B (DISPOSABLE) ×3 IMPLANT
TOWER SMARTMIX MINI (MISCELLANEOUS) IMPLANT
WATER STERILE IRR 1000ML POUR (IV SOLUTION) ×6 IMPLANT
YANKAUER SUCT BULB TIP NO VENT (SUCTIONS) ×2 IMPLANT

## 2019-09-18 NOTE — Progress Notes (Signed)
Called wife to inform her that her husband has just left for surgery. Dr Onnie Graham will call her after surgery. She verbalizes understanding.

## 2019-09-18 NOTE — Transfer of Care (Signed)
Immediate Anesthesia Transfer of Care Note  Patient: Victor [name]  Procedure(s) Performed: TOTAL SHOULDER ARTHROPLASTY (Left Shoulder)  Patient Location: PACU  Anesthesia Type:GA combined with regional for post-op pain  Level of Consciousness: awake, alert , oriented and patient cooperative  Airway & Oxygen Therapy: Patient Spontanous Breathing and Patient connected to nasal cannula oxygen  Post-op Assessment: Report given to RN and Post -op Vital signs reviewed and stable  Post vital signs: Reviewed and stable  Last Vitals:  Vitals Value Taken Time  BP    Temp    Pulse 71 09/18/19 1539  Resp 11 09/18/19 1539  SpO2 100 % 09/18/19 1539  Vitals shown include unvalidated device data.  Last Pain:  Vitals:   09/18/19 1052  TempSrc: Oral  PainSc: 0-No pain         Complications: No complications documented.

## 2019-09-18 NOTE — Progress Notes (Signed)
Patient states not to touch his shoulder when tech attempted to do shoulder prep for surgery. Will do prep after block to shoulder is done.

## 2019-09-18 NOTE — Progress Notes (Signed)
AssistedDr. Valma Cava with left, ultrasound guided, interscalene  block. Side rails up, monitors on throughout procedure. See vital signs in flow sheet. Tolerated Procedure well.

## 2019-09-18 NOTE — Discharge Instructions (Signed)
 Kevin M. Supple, M.D., F.A.A.O.S. Orthopaedic Surgery Specializing in Arthroscopic and Reconstructive Surgery of the Shoulder [phone] [address] - [place], [place] [address] - Fax [phone]   POST-OP TOTAL SHOULDER REPLACEMENT INSTRUCTIONS  1. Follow up in the office for your first post-op appointment 10-14 days from the date of your surgery. If you do not already have a scheduled appointment, our office will contact you to schedule.  2. The bandage over your incision is waterproof. You may begin showering with this dressing on. You may leave this dressing on until first follow up appointment within 2 weeks. We prefer you leave this dressing in place until follow up however after 5-7 days if you are having itching or skin irritation and would like to remove it you may do so. Go slow and tug at the borders gently to break the bond the dressing has with the skin. At this point if there is no drainage it is okay to go without a bandage or you may cover it with a light guaze and tape. You can also expect significant bruising around your shoulder that will drift down your arm and into your chest wall. This is very normal and should resolve over several days.   3. Wear your sling/immobilizer at all times except to perform the exercises below or to occasionally let your arm dangle by your side to stretch your elbow. You also need to sleep in your sling immobilizer until instructed otherwise. It is ok to remove your sling if you are sitting in a controlled environment and allow your arm to rest in a position of comfort by your side or on your lap with pillows to give your neck and skin a break from the sling. You may remove it to allow arm to dangle by side to shower. If you are up walking around and when you go to sleep at night you need to wear it.  4. Range of motion to your elbow, wrist, and hand are encouraged 3-5 times daily. Exercise to your hand and fingers helps to reduce  swelling you may experience.   5. Prescriptions for a pain medication and a muscle relaxant are provided for you. It is recommended that if you are experiencing pain that you pain medication alone is not controlling, add the muscle relaxant along with the pain medication which can give additional pain relief. The first 1-2 days is generally the most severe of your pain and then should gradually decrease. As your pain lessens it is recommended that you decrease your use of the pain medications to an "as needed basis'" only and to always comply with the recommended dosages of the pain medications.  6. Pain medications can produce constipation along with their use. If you experience this, the use of an over the counter stool softener or laxative daily is recommended.   7. For additional questions or concerns, please do not hesitate to call the office. If after hours there is an answering service to forward your concerns to the physician on call.  8.Pain control following an exparel block  To help control your post-operative pain you received a nerve block  performed with Exparel which is a long acting anesthetic (numbing agent) which can provide pain relief and sensations of numbness (and relief of pain) in the operative shoulder and arm for up to 3 days. Sometimes it provides mixed relief, meaning you may still have numbness in certain areas of the arm but can still be able to   move  parts of that arm, hand, and fingers. We recommend that your prescribed pain medications  be used as needed. We do not feel it is necessary to "pre medicate" and "stay ahead" of pain.  Taking narcotic pain medications when you are not having any pain can lead to unnecessary and potentially dangerous side effects.    9. Use the ice machine as much as possible in the first 5-7 days from surgery, then you can wean its use to as needed. The ice typically needs to be replaced every 6 hours, instead of ice you can actually freeze  water bottles to put in the cooler and then fill water around them to avoid having to purchase ice. You can have spare water bottles freezing to allow you to rotate them once they have melted. Try to have a thin shirt or light cloth or towel under the ice wrap to protect your skin.   10.  We recommend that you avoid any dental work or cleaning in the first 3 months following your joint replacement. This is to help minimize the possibility of infection from the bacteria in your mouth that enters your bloodstream during dental work. We also recommend that you take an antibiotic prior to your dental work for the first year after your shoulder replacement to further help reduce that risk. Please simply contact our office for antibiotics to be sent to your pharmacy prior to dental work.  11. Dental Antibiotics:  In most cases prophylactic antibiotics for Dental procdeures after total joint surgery are not necessary.  Exceptions are as follows:  1. History of prior total joint infection  2. Severely immunocompromised (Organ Transplant, cancer chemotherapy, Rheumatoid biologic meds such as Humera)  3. Poorly controlled diabetes (A1C &gt; 8.0, blood glucose over 200)  If you have one of these conditions, contact your surgeon for an antibiotic prescription, prior to your dental procedure.   POST-OP EXERCISES  Pendulum Exercises  Perform pendulum exercises while standing and bending at the waist. Support your uninvolved arm on a table or chair and allow your operated arm to hang freely. Make sure to do these exercises passively - not using you shoulder muscles. These exercises can be performed once your nerve block effects have worn off.  Repeat 20 times. Do 3 sessions per day.     
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
## 2019-09-18 NOTE — Anesthesia Postprocedure Evaluation (Signed)
Anesthesia Post Note  Patient: Victor [name]  Procedure(s) Performed: TOTAL SHOULDER ARTHROPLASTY (Left Shoulder)     Patient location during evaluation: PACU Anesthesia Type: General Level of consciousness: awake and alert Pain management: pain level controlled Vital Signs Assessment: post-procedure vital signs reviewed and stable Respiratory status: spontaneous breathing, nonlabored ventilation, respiratory function stable and patient connected to nasal cannula oxygen Cardiovascular status: blood pressure returned to baseline and stable Postop Assessment: no apparent nausea or vomiting Anesthetic complications: no   No complications documented.  Last Vitals:  Vitals:   09/18/19 1615 09/18/19 1627  BP:  (!) 139/85  Pulse: 70 70  Resp: (!) 10 12  Temp: 36.6 C   SpO2: 97% 99%    Last Pain:  Vitals:   09/18/19 1615  TempSrc:   PainSc: 0-No pain                 Barnet Glasgow

## 2019-09-18 NOTE — Evaluation (Signed)
Occupational Therapy Evaluation Patient Details Name: Victor [name] MRN: [id number] DOB: Jan 22, 1956 Today's Date: 09/18/2019    History of Present Illness Patinet is a 64 year old man s/p shoulder replacement.   Clinical Impression   [name] is a 64 year old man presents s/p shoulder replacement without functional use of non dominant left upper extremity secondary to decreased ROm and strength, shoulder precautions and effects of interscalene block. Therapist provided education and instruction to patient and spouse in regards to exercises, precautions, positioning, donning upper extremity clothing and bathing while maintaining shoulder precautions, ice and edema management and donning/doffing sling. Patient mod assist to donn shirt and sling. Patient min assist to donn boxers, shorts socks and shoes. Patient demonstrated ability to perform toileting independently. Patient and spouse verbalized understanding and demonstrated as needed. Patient to follow up with MD for further therapy needs.      Follow Up Recommendations  Follow surgeon's recommendation for DC plan and follow-up therapies    Equipment Recommendations  None recommended by OT    Recommendations for Other Services       Precautions / Restrictions Precautions Precautions: Shoulder Shoulder Interventions: Shoulder sling/immobilizer Precaution Booklet Issued: No Required Braces or Orthoses: Sling Restrictions Weight Bearing Restrictions: Yes LUE Weight Bearing: Non weight bearing      Mobility Bed Mobility                  Transfers                      Balance                                           ADL either performed or assessed with clinical judgement   ADL Overall ADL's : Needs assistance/impaired Eating/Feeding: Set up   Grooming: Set up   Upper Body Bathing: Set up   Lower Body Bathing: Set up   Upper Body Dressing : Moderate assistance    Lower Body Dressing: Minimal assistance   Toilet Transfer: Modified Independent   Toileting- Clothing Manipulation and Hygiene: Minimal assistance       Functional mobility during ADLs: Modified independent       Vision   Vision Assessment?: No apparent visual deficits     Perception     Praxis      Pertinent Vitals/Pain Pain Assessment: Faces Faces Pain Scale: Hurts a Bear bit Pain Location: Low tricep     Hand Dominance     Extremity/Trunk Assessment Upper Extremity Assessment Upper Extremity Assessment: LUE deficits/detail LUE Deficits / Details: Non functional use of LUE secondary to interscalene block, decreased ROM/strength and shoulder precautions; able to open and close fingers on eval           Communication     Cognition Arousal/Alertness: Awake/alert Behavior During Therapy: WFL for tasks assessed/performed Overall Cognitive Status: Within Functional Limits for tasks assessed                                     General Comments       Exercises     Shoulder Instructions Shoulder Instructions Donning/doffing shirt without moving shoulder: Caregiver independent with task;Patient able to independently direct caregiver;Moderate assistance Method for sponge bathing under operated UE: Caregiver independent with task;Patient able to independently  direct caregiver Donning/doffing sling/immobilizer: Caregiver independent with task;Patient able to independently direct caregiver;Moderate assistance Correct positioning of sling/immobilizer: Patient able to independently direct caregiver;Caregiver independent with task Pendulum exercises (written home exercise program): Patient able to independently direct caregiver;Caregiver independent with task ROM for elbow, wrist and digits of operated UE: Patient able to independently direct caregiver;Caregiver independent with task Sling wearing schedule (on at all times/off for ADL's): Patient able to  independently direct caregiver;Caregiver independent with task Proper positioning of operated UE when showering: Patient able to independently direct caregiver;Caregiver independent with task Dressing change: Patient able to independently direct caregiver;Caregiver independent with task Positioning of UE while sleeping: Patient able to independently direct caregiver;Caregiver independent with task    Home Living Family/patient expects to be discharged to:: Private residence Living Arrangements: Spouse/significant other Available Help at Discharge: Available 24 hours/day Type of Home: House                                  Prior Functioning/Environment Level of Independence: Independent                 OT Problem List: Decreased strength;Decreased range of motion;Impaired UE functional use;Pain      OT Treatment/Interventions:      OT Goals(Current goals can be found in the care plan section) Acute Rehab OT Goals OT Goal Formulation: All assessment and education complete, DC therapy  OT Frequency:     Barriers to D/C:            Co-evaluation              AM-PAC OT "6 Clicks" Daily Activity     Outcome Measure Help from another person eating meals?: A Bomkamp Help from another person taking care of personal grooming?: A Terrill Help from another person toileting, which includes using toliet, bedpan, or urinal?: A Schollmeyer Help from another person bathing (including washing, rinsing, drying)?: A Remmel Help from another person to put on and taking off regular upper body clothing?: A Lot Help from another person to put on and taking off regular lower body clothing?: A [place] 6 Click Score: 17   End of Session Nurse Communication:  (okay to dc from OT standpoint, education completed)  Activity Tolerance: Patient tolerated treatment well Patient left: in chair  OT Visit Diagnosis: Muscle weakness (generalized) (M62.81);Pain Pain - Right/Left: Left Pain  - part of body: Shoulder                Time: 9323-5573 OT Time Calculation (min): 29 min Charges:  OT General Charges $OT Visit: 1 Visit OT Evaluation $OT Eval Low Complexity: 1 Low OT Treatments $Self Care/Home Management : 8-22 mins  Chantry Headen, OTR/L [place] [phone] Pager: [place] 09/18/2019, 5:22 PM

## 2019-09-18 NOTE — Anesthesia Procedure Notes (Signed)
Anesthesia Regional Block: Interscalene brachial plexus block   Pre-Anesthetic Checklist: ,, timeout performed, Correct Patient, Correct Site, Correct Laterality, Correct Procedure, Correct Position, site marked, Risks and benefits discussed,  Surgical consent,  Pre-op evaluation,  At surgeon's request and post-op pain management  Laterality: Upper and Left  Prep: Maximum Sterile Barrier Precautions used, chloraprep       Needles:  Injection technique: Single-shot  Needle Type: Echogenic Needle     Needle Length: 5cm  Needle Gauge: 21     Additional Needles:   Procedures:,,,, ultrasound used (permanent image in chart),,,,  Narrative:  Start time: 09/18/2019 11:45 AM End time: 09/18/2019 12:00 PM Injection made incrementally with aspirations every 5 mL.  Performed by: Personally  Anesthesiologist: Barnet Glasgow, MD  Additional Notes: Block assessed prior to procedure. Patient tolerated procedure well.

## 2019-09-18 NOTE — Anesthesia Procedure Notes (Signed)
Procedure Name: Intubation Date/Time: 09/18/2019 1:38 PM Performed by: Lavina Hamman, CRNA Pre-anesthesia Checklist: Patient identified, Emergency Drugs available, Suction available, Patient being monitored and Timeout performed Patient Re-evaluated:Patient Re-evaluated prior to induction Oxygen Delivery Method: Circle system utilized Preoxygenation: Pre-oxygenation with 100% oxygen Induction Type: IV induction Ventilation: Mask ventilation without difficulty Laryngoscope Size: Mac and 3 Grade View: Grade II Tube type: Oral Tube size: 7.5 mm Number of attempts: 1 Airway Equipment and Method: Stylet Placement Confirmation: ETT inserted through vocal cords under direct vision,  positive ETCO2,  CO2 detector and breath sounds checked- equal and bilateral Secured at: 22 cm Tube secured with: Tape Dental Injury: Teeth and Oropharynx as per pre-operative assessment  Comments: ATOI

## 2019-09-18 NOTE — Op Note (Signed)
09/18/2019  3:19 PM  PATIENT:   Victor [name]  64 y.o. male  PRE-OPERATIVE DIAGNOSIS:  Left shoulder osteoarthritis  POST-OPERATIVE DIAGNOSIS: Same  PROCEDURE: Left anatomic total shoulder arthroplasty utilizing an extra-large cemented glenoid, 49 x 18 humeral head a 49 trunnion and a large cage screw  SURGEON:  Mckinsley Koelzer, Metta Clines M.D.  ASSISTANTS: Jenetta Loges, PA-C  ANESTHESIA:   General endotracheal and interscalene block with Exparel  EBL: 200 cc  SPECIMEN: None  Drains: None   PATIENT DISPOSITION:  PACU - hemodynamically stable.    PLAN OF CARE: Discharge to home after PACU  Brief history:  Mr. Byers is a 64 year old gentleman with a complex past medical history most significant for chronic pain with a long history of pain management services status post previous cervical and lumbar surgeries who has now presented for progressively increasing left shoulder pain and associated functional limitations secondary to advanced osteoarthritis.  Due to his increasing difficulties and failure to respond to prolonged attempts at conservative management he is brought to the operating at this time for planned left total shoulder arthroplasty.  Preoperatively, I counseled the patient regarding treatment options and risks versus benefits thereof.  Possible surgical complications were all reviewed including potential for bleeding, infection, neurovascular injury, persistent pain, loss of motion, anesthetic complication, failure of the implant, and possible need for additional surgery. They understand and accept and agrees with our planned procedure.   Procedure in detail:  After undergoing routine preop evaluation the patient received prophylactic antibiotics and interscalene block with Exparel was established in the holding area by the anesthesia department.  Patient subsequently placed supine on the operating table and underwent the smooth induction of a general endotracheal  anesthesia.  Placed into the beachchair position and appropriately padded and protected.  The left shoulder girdle region was sterilely prepped and draped in standard fashion.  Timeout was called.  An anterior deltopectoral approach left shoulder was made through a 10 cm incision.  Skin flaps were elevated dissection carried deeply and electrocautery used for hemostasis.  The deltopectoral interval was then developed from proximal to distal with the vein taken laterally and the upper centimeter half the pectoralis major was tenotomized to enhance exposure and the conjoined tendon was mobilized and retracted medially and adhesions were then divided beneath the deltoid and appropriate retractors were placed.  The biceps tendon was then tenodesed at the upper border of the pectoralis major tendon and the proximal segment was then unroofed and excised.  The rotator cuff were then split along the rotator interval to the base of the coracoid and the subscapularis insertion was then carefully outlined and an oscillating saw was used to create a lesser tuberosity osteotomy removing a wafer of bone and the tendon was tagged mobilized and reflected medially.  Capsular attachments were then divided along the anterior and inferior margins of the humeral neck were a large osteophyte was carefully dissected free allowing deliver the head through the wound.  An extra medullary guide was then used to outline our proposed humeral head resection which was performed at approximate 25 degrees of retroversion following the native retroversion an oscillating saw was used to perform a humeral head resection.  A second pass with a saw was required to gain the appropriate bony resection.  The large osteophytes on the margin of the humeral head were then removed with a rondure bringing this back to the native bony contours.  The humeral metaphysis was then sized to the 49 and the  final positioning was determined and a metal cap was then  placed over the cut proximal humeral surface.  We then exposed the glenoid with appropriate retractors and did identify significant retroversion.  I performed a circumferential labral resection gaining complete visualization the periphery of the glenoid.  Using our preoperative CT scan our guide was appropriately set up and guidepin was then directed into the center of the glenoid and the glenoid was then reamed to correcting the moderate retroversion proximally 10 degrees and obtaining a stable subchondral bony bed using an extra-large reamer.  The glenoid was then terminally prepared with the central drill followed by the superior and inferior peg and slot respectively.  The glenoid was then broached and a trial showed excellent fit.  All bony debris was then carefully irrigated free and the margins of the glenoid were cleared of any residual bony overhang.  Cement was mixed and introduced into the superior and inferior peg and slot respectively and morselized bone placed around the central peg of our glenoid which was then impacted with excellent fit and fixation.  We then returned our attention back to the proximal humerus where preparation of the humeral metaphysis was completed our trunnion was placed in the large cage screw was then selected and introduced obtaining excellent purchase and fixation.  We then performed a trial reduction and ultimately felt that the 49 x 18 humeral head gave [place] the best soft tissue balance with approximately 50.  Percent translation.  The trial was then removed.  We placed a series of 3 Medial Row suture anchors to be used for our subscapularis repair.  When this was completed the [facility] taper was meticulously cleaned and dried and the final 49 x 18 head was impacted.  Final reduction was then performed.  We then passed our series of medial row sutures through the bone tendon junction of the lesser tuberosity osteotomy equidistant across the width of the subscapularis tendon.   These limbs were then passed in an alternating fashion into 2 Lateral Row anchors in the bicipital groove allowing a double row repair with excellent reapposition of our bone fragment onto the donor side of the lesser tuberosity.  A pair of figure-of-eight suture tapes were then used to further close the rotator cuff interval superiorly allowing excellent apposition.  Suture limbs all then clipped.  Final hemostasis was obtained.  We are very pleased with the overall shoulder motion stability and soft tissue balance in the shoulder easily achieved 45 degrees of external rotation without excessive tension on the subscap repair.  The deltopectoral interval was then reapproximated a series of figure-of-eight #1 Vicryl sutures.  2-0 Vicryl used for subcu layer and intracuticular 3-0 Monocryl for the skin followed by Dermabond and Aquacel dressing.  Left arm was placed into a sling and the patient was awakened, extubated, and taken to the recovery room in stable condition.  Jenetta Loges, PA-C was utilized as an Environmental consultant throughout this case, essential for help with positioning the patient, positioning extremity, tissue manipulation, implantation of the prosthesis, suture management, wound closure, and intraoperative decision-making.  Marin Shutter MD   Contact # [phone]

## 2019-09-19 ENCOUNTER — Encounter (HOSPITAL_COMMUNITY): Payer: Self-pay | Admitting: Orthopedic Surgery

## 2019-09-30 ENCOUNTER — Ambulatory Visit: Payer: Medicare Other | Admitting: Rheumatology

## 2019-10-21 ENCOUNTER — Other Ambulatory Visit: Payer: Self-pay

## 2019-10-21 DIAGNOSIS — L819 Disorder of pigmentation, unspecified: Secondary | ICD-10-CM

## 2019-10-23 NOTE — Progress Notes (Signed)
Office Visit Note  Patient: Victor [name]             Date of Birth: April 15, 1955           MRN: [id number]             PCP: Antony Contras, MD Referring: Antony Contras, MD Visit Date: 11/04/2019 Occupation: @GUAROCC @  Subjective:  Other (left great toe )   History of Present Illness: Haig Gerardo is a 64 y.o. male with history of gout, osteoarthritis, degenerative disc disease and fibromyalgia. He denies having any gout flares since the last visit. He continues to have discomfort from fibromyalgia, osteoarthritis and disc disease. He had left shoulder replacement by Dr. Onnie Graham on July 22. He is gradually recovering from it.  He is concerned about the left toenail infection.  Activities of Daily Living:  Patient reports morning stiffness for 1 hour.   Patient Reports nocturnal pain.  Difficulty dressing/grooming: Reports Difficulty climbing stairs: Denies Difficulty getting out of chair: Denies Difficulty using hands for taps, buttons, cutlery, and/or writing: Denies  Review of Systems  Constitutional: Negative for fatigue.  HENT: Negative for mouth sores, mouth dryness and nose dryness.   Eyes: Negative for itching and dryness.  Respiratory: Negative for shortness of breath and difficulty breathing.   Cardiovascular: Negative for chest pain and palpitations.  Gastrointestinal: Negative for blood in stool, constipation and diarrhea.  Endocrine: Negative for increased urination.  Genitourinary: Negative for difficulty urinating.  Musculoskeletal: Positive for arthralgias, joint pain and morning stiffness. Negative for joint swelling, myalgias, muscle tenderness and myalgias.  Skin: Positive for color change and rash.  Allergic/Immunologic: Negative for susceptible to infections.  Neurological: Positive for numbness and headaches. Negative for dizziness, memory loss and weakness.  Hematological: Positive for bruising/bleeding tendency.  Psychiatric/Behavioral:  Negative for confusion.    PMFS History:  Patient Active Problem List   Diagnosis Date Noted   Lumbar foraminal stenosis 08/06/2017   HNP (herniated nucleus pulposus), thoracic 09/05/2016   Chronic kidney disease (CKD) 04/26/2016   DJD (degenerative joint disease), cervical 04/26/2016   Essential hypertension 04/26/2016   Dyslipidemia 04/26/2016   Sleep apnea 04/26/2016   Fibromyalgia 04/18/2016   Idiopathic chronic gout of multiple sites without tophus 04/18/2016   Spondylosis of lumbar region without myelopathy or radiculopathy 04/18/2016   Primary osteoarthritis of both knees 04/18/2016   Lumbar herniated disc 07/22/2015   Morbid obesity ([place]) 02/25/2014   S/P right TKA 02/24/2014   Heartburn 01/10/2011    Past Medical History:  Diagnosis Date   Anxiety    Arthritis    "all over" (04/15/2013)   Back pain, chronic    Chronic neck pain    DDD (degenerative disc disease), cervical    DDD (degenerative disc disease), lumbar    Fibromyalgia    GERD (gastroesophageal reflux disease)    Gout    Headache    High cholesterol    History of bronchitis    Hypertension    Leg cramps    Right greater than left   Melanoma (HCC)    Nose   MRSA infection    Neuropathy of both feet    Pre-diabetes    Radiculopathy of lumbar region    Sleep apnea    "don't use CPAP" (04/15/2013)    Family History  Problem Relation Age of Onset   Hypertension Father    Cancer Mother        breast    Heart Problems Mother  Alzheimer's disease Mother    Allergies Daughter    Interstitial cystitis Daughter    Past Surgical History:  Procedure Laterality Date   ANTERIOR CERVICAL DECOMP/DISCECTOMY FUSION  2000's X3   "had 3 surgeries on my front neck" (04/15/2013)   ANTERIOR LAT LUMBAR FUSION Left 08/06/2017   Procedure: LEFT LUMBAR TWO-THREE ANTERIOR LATERAL LUMBAR FUSION WITH PERCUTANEOUS SCREWS;  Surgeon: Earnie Larsson, MD;  Location: [facility];   Service: Neurosurgery;  Laterality: Left;   APPENDECTOMY  ~ 2010   CARPAL TUNNEL RELEASE Left 1990's   CATARACT EXTRACTION, BILATERAL     COLONOSCOPY     ear drum surgery     FLEXIBLE SIGMOIDOSCOPY  12/21/2011   Procedure: FLEXIBLE SIGMOIDOSCOPY;  Surgeon: Lear Ng, MD;  Location: [facility];  Service: Endoscopy;  Laterality: N/A;   HAND TENDON SURGERY Left ~ 1978 X2-1980's X3   "had 5 ORs after laceration"   HOT HEMOSTASIS  12/21/2011   Procedure: HOT HEMOSTASIS (ARGON PLASMA COAGULATION/BICAP);  Surgeon: Lear Ng, MD;  Location: [facility];  Service: Endoscopy;  Laterality: N/A;   INGUINAL HERNIA REPAIR Left 04/15/2013   Procedure: LAPAROSCOPIC REPAIR OF INCARCERATED LEFT INGUINAL HERNIA;  Surgeon: Gayland Curry, MD;  Location: [facility];  Service: General;  Laterality: Left;   INGUINAL HERNIA REPAIR Bilateral 1970's - [place] Left 04/15/2013   INSERTION OF MESH Left 04/15/2013   Procedure: INSERTION OF MESH;  Surgeon: Gayland Curry, MD;  Location: [facility];  Service: General;  Laterality: Left;   KNEE ARTHROSCOPY Right 1990's   LAPAROSCOPY N/A 04/15/2013   Procedure: LAPAROSCOPY DIAGNOSTIC;  Surgeon: Gayland Curry, MD;  Location: [facility];  Service: General;  Laterality: N/A;   LUMBAR LAMINECTOMY/DECOMPRESSION MICRODISCECTOMY Right 07/22/2015   Procedure: Right Lumbar Two-Three Microdiscectomy;  Surgeon: Earnie Larsson, MD;  Location: [facility];  Service: Neurosurgery;  Laterality: Right;   LUMBAR LAMINECTOMY/DECOMPRESSION MICRODISCECTOMY Right 09/05/2016   Procedure: Microdiscectomy - right - T12-L1;  Surgeon: Earnie Larsson, MD;  Location: [facility];  Service: Neurosurgery;  Laterality: Right;   NASAL SEPTUM SURGERY Bilateral 2014   "removed polyps & infection"   POLYPECTOMY  01/10/2011   Procedure: POLYPECTOMY;  Surgeon: Lear Ng, MD;  Location: [facility];  Service: Endoscopy;  Laterality: N/A;   POSTERIOR LUMBAR FUSION   11/1995   "ray cages" (04/15/2013)   SHOULDER ARTHROSCOPY Bilateral    "twice each"   TONSILLECTOMY  1960's   TOTAL KNEE ARTHROPLASTY Right 02/24/2014   Procedure: TOTAL RIGHT KNEE ARTHROPLASTY;  Surgeon: Mauri Pole, MD;  Location: [facility];  Service: Orthopedics;  Laterality: Right;   TOTAL SHOULDER ARTHROPLASTY Left 09/18/2019   Procedure: TOTAL SHOULDER ARTHROPLASTY;  Surgeon: Justice Britain, MD;  Location: [facility];  Service: Orthopedics;  Laterality: Left;  129min   UPPER GI ENDOSCOPY     VASECTOMY  1988   WISDOM TOOTH EXTRACTION  2000's   Social History   Social History Narrative   Not on file   Immunization History  Administered Date(s) Administered   Influenza Whole 11/08/2011   Influenza-Unspecified 02/27/2013   PFIZER SARS-COV-2 Vaccination 06/23/2019, 07/14/2019     Objective: Vital Signs: BP (!) 171/113 (BP Location: Right Arm, Patient Position: Sitting, Cuff Size: Normal)    Pulse 61    Resp 17    Ht 5\' 8"  (1.727 m)    Wt 239 lb (108.4 kg)    BMI 36.34 kg/m    Physical Exam Vitals and nursing note  reviewed.  Constitutional:      Appearance: He is well-developed.  HENT:     Head: Normocephalic and atraumatic.  Eyes:     Conjunctiva/sclera: Conjunctivae normal.     Pupils: Pupils are equal, round, and reactive to light.  Cardiovascular:     Rate and Rhythm: Normal rate and regular rhythm.     Heart sounds: Normal heart sounds.  Pulmonary:     Effort: Pulmonary effort is normal.     Breath sounds: Normal breath sounds.  Abdominal:     General: Bowel sounds are normal.     Palpations: Abdomen is soft.  Musculoskeletal:     Cervical back: Normal range of motion and neck supple.  Skin:    General: Skin is warm and dry.     Capillary Refill: Capillary refill takes less than 2 seconds.  Neurological:     Mental Status: He is alert and oriented to person, place, and time.  Psychiatric:        Behavior: Behavior normal.      Musculoskeletal Exam:  He is limited painful range of motion of his cervical spine.  His left shoulder was in a sling due to recent surgery.  Elbow joints, wrist joints with good range of motion.  He has PIP and DIP thickening in his hands.  Knee joints are in good range of motion.  He had no tenderness across MTPs.  CDAI Exam: CDAI Score: -- Patient Global: --; Provider Global: -- Swollen: --; Tender: -- Joint Exam 11/04/2019   No joint exam has been documented for this visit   There is currently no information documented on the homunculus. Go to the Rheumatology activity and complete the homunculus joint exam.  Investigation: No additional findings.  Imaging: VAS [place] ABI WITH/WO TBI  Result Date: 10/30/2019 LOWER EXTREMITY DOPPLER STUDY Indications: Peripheral artery disease. High Risk Factors: Hypertension, past history of smoking. Other Factors: Cold, blue. numb feet/ Bilateral edema.  Limitations: Today's exam was limited due to involuntary patient movement. Comparison Study: No prior exam Performing Technologist: Alvia [facility] RVT  Examination Guidelines: A complete evaluation includes at minimum, Doppler waveform signals and systolic blood pressure reading at the level of bilateral brachial, anterior tibial, and posterior tibial arteries, when vessel segments are accessible. Bilateral testing is considered an integral part of a complete examination. Photoelectric Plethysmograph (PPG) waveforms and toe systolic pressure readings are included as required and additional duplex testing as needed. Limited examinations for reoccurring indications may be performed as noted.  ABI Findings: +---------+------------------+-----+---------+--------+  Right     Rt Pressure (mmHg) Index Waveform  Comment   +---------+------------------+-----+---------+--------+  Brachial  167                                          +---------+------------------+-----+---------+--------+  PTA       202                1.21  triphasic            +---------+------------------+-----+---------+--------+  DP        192                1.15  triphasic           +---------+------------------+-----+---------+--------+  Great Toe 115                0.69  Abnormal            +---------+------------------+-----+---------+--------+ +---------+------------------+-----+---------+-------+  Left      Lt Pressure (mmHg) Index Waveform  Comment  +---------+------------------+-----+---------+-------+  Brachial  157                                         +---------+------------------+-----+---------+-------+  PTA       204                1.22  triphasic          +---------+------------------+-----+---------+-------+  DP        180                1.08  triphasic          +---------+------------------+-----+---------+-------+  Great Toe 133                0.80  Normal             +---------+------------------+-----+---------+-------+ +-------+-----------+-----------+------------+------------+  ABI/TBI Today's ABI Today's TBI Previous ABI Previous TBI  +-------+-----------+-----------+------------+------------+  Right   1.21        0.69                                   +-------+-----------+-----------+------------+------------+  Left    1.22        0.80                                   +-------+-----------+-----------+------------+------------+  Summary: Right: Resting right ankle-brachial index is within normal range. No evidence of significant right lower extremity arterial disease. The right toe-brachial index is abnormal. Left: Resting left ankle-brachial index is within normal range. No evidence of significant left lower extremity arterial disease. The left toe-brachial index is normal.  *See table(s) above for measurements and observations.  Electronically signed by Ruta Hinds MD on 10/30/2019 at 12:49:07 PM.    Final     Recent Labs: Lab Results  Component Value Date   WBC 5.4 09/09/2019   HGB 17.4 (H) 09/09/2019   PLT 192 09/09/2019   NA 138 09/09/2019   K  5.2 (H) 09/09/2019   CL 102 09/09/2019   CO2 26 09/09/2019   GLUCOSE 143 (H) 09/09/2019   BUN 18 09/09/2019   CREATININE 1.05 09/09/2019   BILITOT 0.8 04/15/2013   ALKPHOS 46 04/15/2013   AST 71 (H) 04/15/2013   ALT 40 04/15/2013   PROT 7.5 04/15/2013   ALBUMIN 4.0 04/15/2013   CALCIUM 9.7 09/09/2019   GFRAA >60 09/09/2019   Labs from October 13, 2019 ([facility]) CMP GFR 70, AST and ALT normal, LDL 131, hemoglobin A1c 5.4  Speciality Comments: No specialty comments available.  Procedures:  No procedures performed Allergies: Tetracyclines & related and Niacin and related   Assessment / Plan:     Visit Diagnoses: Idiopathic chronic gout of multiple sites without tophus -  allopurinol 450 mg po daily and colchicine 0.6 mg 1 tablet by mouth BID.  He takes colchicine only on as needed basis.  He has not had any gout flare in a long time.  He had recent CMP which was normal.  We will get CBC with differential and uric acid today.- Plan: Uric acid  Status post shoulder replacement, left - July 22 by Dr. Onnie Graham.  He is recovering from it  slowly.  Primary osteoarthritis of both hands-he has chronic discomfort.  Primary osteoarthritis of left knee-chronic pain  History of total right knee replacement (TKR)-doing well  Primary osteoarthritis of both feet-he is stiffness in his feet from osteoarthritis.  He is concerned about paronychia.  He will be seeing his PCP.  DDD (degenerative disc disease), cervical-he has limited range of motion with discomfort.  Spondylosis of lumbar region without myelopathy or radiculopathy-he is followed by neurosurgery.  Fibromyalgia-he is in chronic discomfort.  He is on gabapentin.  I discussed decreasing gabapentin as he ages.  Also side effects of gabapentin increased risk of falling was discussed.  History of insomnia  History of hypertension  History of sleep apnea  History of hypercholesterolemia  History of hiatal hernia  History of  gastroesophageal reflux (GERD)  Medication monitoring encounter - Plan: CBC with Differential/Platelet  Orders: Orders Placed This Encounter  Procedures   Uric acid   CBC with Differential/Platelet   No orders of the defined types were placed in this encounter.   Follow-Up Instructions: Return in about 6 months (around 05/03/2020) for Gout, Osteoarthritis.   Bo Merino, MD  Note - This record has been created using Editor, commissioning.  Chart creation errors have been sought, but may not always  have been located. Such creation errors do not reflect on  the standard of medical care.

## 2019-10-30 ENCOUNTER — Ambulatory Visit (INDEPENDENT_AMBULATORY_CARE_PROVIDER_SITE_OTHER): Payer: Medicare Other | Admitting: Vascular Surgery

## 2019-10-30 ENCOUNTER — Encounter: Payer: Self-pay | Admitting: Vascular Surgery

## 2019-10-30 ENCOUNTER — Ambulatory Visit (HOSPITAL_COMMUNITY)
Admission: RE | Admit: 2019-10-30 | Discharge: 2019-10-30 | Disposition: A | Discharge status: Home / Self Care | Payer: Medicare Other | Source: Ambulatory Visit | Attending: Vascular Surgery | Admitting: Vascular Surgery

## 2019-10-30 ENCOUNTER — Other Ambulatory Visit: Payer: Self-pay

## 2019-10-30 VITALS — BP 156/109 | HR 62 | Temp 98.3°F | Resp 20 | Ht 68.0 in | Wt 233.0 lb

## 2019-10-30 DIAGNOSIS — L819 Disorder of pigmentation, unspecified: Secondary | ICD-10-CM | POA: Insufficient documentation

## 2019-10-30 DIAGNOSIS — G609 Hereditary and idiopathic neuropathy, unspecified: Secondary | ICD-10-CM

## 2019-10-30 NOTE — Progress Notes (Signed)
Referring Physician: Dr Moreen Fowler  Patient name: Victor [name] MRN: [id number] DOB: [birth year] Sex: male  REASON FOR CONSULT: Bilateral foot pain  HPI: Victor [name] is a 64 y.o. male, with a multiyear history of burning stinging numbness tingling in both feet as well as color changes of red-blue and white.  These events can be fairly random although the numbness is fairly persistent.  He has been told in the past that all of his symptoms are related to neuropathy.  He is a former smoker and quit about 3 years ago but never smoked heavily.  He does not really describe claudication.  He will get some tightness in his calf and thigh on occasion but usually this is at rest.  He has been told in the past that his symptoms were related to his back.  He does not have a prior history of coronary artery disease or stroke.  Other medical problems include chronic back pain, arthritis, elevated cholesterol, hypertension, prediabetes, sleep apnea all of which are currently stable.  Past Medical History:  Diagnosis Date  . Anxiety   . Arthritis    "all over" (04/15/2013)  . Back pain, chronic   . Chronic neck pain   . DDD (degenerative disc disease), cervical   . DDD (degenerative disc disease), lumbar   . Fibromyalgia   . GERD (gastroesophageal reflux disease)   . Gout   . Headache   . High cholesterol   . History of bronchitis   . Hypertension   . Leg cramps    Right greater than left  . Melanoma ([place])    Nose  . MRSA infection   . Neuropathy of both feet   . Pre-diabetes   . Radiculopathy of lumbar region   . Sleep apnea    "don't use CPAP" (04/15/2013)   Past Surgical History:  Procedure Laterality Date  . ANTERIOR CERVICAL DECOMP/DISCECTOMY FUSION  2000's X3   "had 3 surgeries on my front neck" (04/15/2013)  . ANTERIOR LAT LUMBAR FUSION Left 08/06/2017   Procedure: LEFT LUMBAR TWO-THREE ANTERIOR LATERAL LUMBAR FUSION WITH PERCUTANEOUS SCREWS;  Surgeon: Earnie Larsson, MD;   Location: [facility];  Service: Neurosurgery;  Laterality: Left;  . APPENDECTOMY  ~ 2010  . CARPAL TUNNEL RELEASE Left 1990's  . CATARACT EXTRACTION, BILATERAL    . COLONOSCOPY    . ear drum surgery    . FLEXIBLE SIGMOIDOSCOPY  12/21/2011   Procedure: FLEXIBLE SIGMOIDOSCOPY;  Surgeon: Lear Ng, MD;  Location: [facility];  Service: Endoscopy;  Laterality: N/A;  . HAND TENDON SURGERY Left ~ 1978 X2-1980's X3   "had 5 ORs after laceration"  . HOT HEMOSTASIS  12/21/2011   Procedure: HOT HEMOSTASIS (ARGON PLASMA COAGULATION/BICAP);  Surgeon: Lear Ng, MD;  Location: [facility];  Service: Endoscopy;  Laterality: N/A;  . INGUINAL HERNIA REPAIR Left 04/15/2013   Procedure: LAPAROSCOPIC REPAIR OF INCARCERATED LEFT INGUINAL HERNIA;  Surgeon: Gayland Curry, MD;  Location: [facility];  Service: General;  Laterality: Left;  . INGUINAL HERNIA REPAIR Bilateral 1970's - 1984  . INGUINAL HERNIA REPAIR Left 04/15/2013  . INSERTION OF MESH Left 04/15/2013   Procedure: INSERTION OF MESH;  Surgeon: Gayland Curry, MD;  Location: [facility];  Service: General;  Laterality: Left;  . KNEE ARTHROSCOPY Right 1990's  . LAPAROSCOPY N/A 04/15/2013   Procedure: LAPAROSCOPY DIAGNOSTIC;  Surgeon: Gayland Curry, MD;  Location: [facility];  Service: General;  Laterality: N/A;  . LUMBAR LAMINECTOMY/DECOMPRESSION MICRODISCECTOMY  Right 07/22/2015   Procedure: Right Lumbar Two-Three Microdiscectomy;  Surgeon: Earnie Larsson, MD;  Location: [facility];  Service: Neurosurgery;  Laterality: Right;  . LUMBAR LAMINECTOMY/DECOMPRESSION MICRODISCECTOMY Right 09/05/2016   Procedure: Microdiscectomy - right - T12-L1;  Surgeon: Earnie Larsson, MD;  Location: [facility];  Service: Neurosurgery;  Laterality: Right;  . NASAL SEPTUM SURGERY Bilateral 2014   "removed polyps & infection"  . POLYPECTOMY  01/10/2011   Procedure: POLYPECTOMY;  Surgeon: Lear Ng, MD;  Location: [facility];  Service: Endoscopy;  Laterality: N/A;  . POSTERIOR  LUMBAR FUSION  11/1995   "ray cages" (04/15/2013)  . SHOULDER ARTHROSCOPY Bilateral    "twice each"  . TONSILLECTOMY  1960's  . TOTAL KNEE ARTHROPLASTY Right 02/24/2014   Procedure: TOTAL RIGHT KNEE ARTHROPLASTY;  Surgeon: Mauri Pole, MD;  Location: [facility];  Service: Orthopedics;  Laterality: Right;  . TOTAL SHOULDER ARTHROPLASTY Left 09/18/2019   Procedure: TOTAL SHOULDER ARTHROPLASTY;  Surgeon: Justice Britain, MD;  Location: [facility];  Service: Orthopedics;  Laterality: Left;  115min  . UPPER GI ENDOSCOPY    . VASECTOMY  1988  . WISDOM TOOTH EXTRACTION  2000's    Family History  Problem Relation Age of Onset  . Hypertension Father   . Cancer Mother        breast   . Heart Problems Mother   . Alzheimer's disease Mother   . Allergies Daughter   . Interstitial cystitis Daughter     SOCIAL HISTORY: Social History   Socioeconomic History  . Marital status: Married    Spouse name: Not on file  . Number of children: Not on file  . Years of education: Not on file  . Highest education level: Not on file  Occupational History  . Not on file  Tobacco Use  . Smoking status: Former Smoker    Packs/day: 0.10    Years: 15.00    Pack years: 1.50    Types: Cigarettes, Cigars    Quit date: 01/08/2010    Years since quitting: 9.8  . Smokeless tobacco: Never Used  . Tobacco comment: 04/15/2013 "smoked ~ 1 pack/month"  Vaping Use  . Vaping Use: Never used  Substance and Sexual Activity  . Alcohol use: Yes    Alcohol/week: 5.0 standard drinks    Types: 1 Glasses of wine, 4 Cans of beer per week    Comment: 04/15/2013 "glass of wine w/dinner 2-3 times/month;  plus 3-4 beers/wk"  . Drug use: No  . Sexual activity: Yes  Other Topics Concern  . Not on file  Social History Narrative  . Not on file   Social Determinants of Health   Financial Resource Strain:   . Difficulty of Paying Living Expenses: Not on file  Food Insecurity:   . Worried About Charity fundraiser in the Last  Year: Not on file  . Ran Out of Food in the Last Year: Not on file  Transportation Needs:   . Lack of Transportation (Medical): Not on file  . Lack of Transportation (Non-Medical): Not on file  Physical Activity:   . Days of Exercise per Week: Not on file  . Minutes of Exercise per Session: Not on file  Stress:   . Feeling of Stress : Not on file  Social Connections:   . Frequency of Communication with Friends and Family: Not on file  . Frequency of Social Gatherings with Friends and Family: Not on file  . Attends Religious Services: Not on file  .  Active Member of Clubs or Organizations: Not on file  . Attends Archivist Meetings: Not on file  . Marital Status: Not on file  Intimate Partner Violence:   . Fear of Current or Ex-Partner: Not on file  . Emotionally Abused: Not on file  . Physically Abused: Not on file  . Sexually Abused: Not on file    Allergies  Allergen Reactions  . Tetracyclines & Related Other (See Comments)    " makes me angry"  . Niacin And Related Itching    Current Outpatient Medications  Medication Sig Dispense Refill  . acetaminophen (TYLENOL) 500 MG tablet Take 1,000 mg by mouth every 8 (eight) hours as needed for moderate pain.     Marland Kitchen allopurinol (ZYLOPRIM) 300 MG tablet TAKE 1 AND 1/2 TABLETS BY MOUTH DAILY (Patient taking differently: Take 450 mg by mouth daily. ) 135 tablet 0  . ALPRAZolam (XANAX) 1 MG tablet Take 1 mg by mouth daily as needed for anxiety.     Marland Kitchen atenolol (TENORMIN) 100 MG tablet Take 100 mg by mouth every morning.     Marland Kitchen b complex vitamins tablet Take 1 tablet by mouth daily.    . Biotin 5000 MCG CAPS Take 5,000 mcg by mouth daily.    . Calcium Carbonate (CALCIUM 600 PO) Take 600 mg by mouth daily.    . clobetasol ointment (TEMOVATE) 1.61 % Apply 1 application topically 2 (two) times daily as needed (for skin irritation.).     Marland Kitchen colchicine 0.6 MG tablet TAKE 1 TABLET BY MOUTH EVERY DAY (Patient taking differently: Take 0.6  mg by mouth daily. ) 90 tablet 0  . cyclobenzaprine (FLEXERIL) 10 MG tablet Take 1 tablet (10 mg total) by mouth 3 (three) times daily as needed for muscle spasms. 30 tablet 1  . diazepam (VALIUM) 5 MG tablet Take 1 tablet (5 mg total) by mouth every 8 (eight) hours as needed for muscle spasms. 50 tablet 0  . fish oil-omega-3 fatty acids 1000 MG capsule Take 1 g by mouth 2 (two) times daily.     . fluticasone (FLONASE) 50 MCG/ACT nasal spray Place 1 spray into both nostrils daily. For congestion    . gabapentin (NEURONTIN) 300 MG capsule TAKE 2 CAPSULES BY MOUTH 3 TIMES A DAY (Patient taking differently: Take 600 mg by mouth 3 (three) times daily. ) 540 capsule 0  . GINSENG PO Take 600 mg by mouth daily.    Marland Kitchen guaiFENesin (MUCINEX) 600 MG 12 hr tablet Take 600 mg by mouth at bedtime as needed (for cough).    . hydrocortisone cream 1 % Apply 1 application topically daily as needed for itching.    Marland Kitchen lisinopril (ZESTRIL) 5 MG tablet Take 10 mg by mouth daily.     . Melatonin 10 MG TABS Take 10 mg by mouth at bedtime as needed (sleep).    . Methylsulfonylmethane (MSM) 1000 MG CAPS Take 1,000 mg by mouth daily.    . Multiple Vitamins-Minerals (MULTIVITAMIN WITH MINERALS) tablet Take 1 tablet by mouth daily.      . naproxen (NAPROSYN) 500 MG tablet Take 1 tablet (500 mg total) by mouth 2 (two) times daily with a meal. 60 tablet 1  . NONFORMULARY OR COMPOUNDED ITEM Apply 1 g topically every evening. Doxepin 5%, Amantadine 3%, Pentoxifylline 3%, Lamotrigine 2%, Sertraline 3%, Gabapentin 6% PCCA Lipoderm Base Cream    . omeprazole (PRILOSEC) 40 MG capsule Take 40 mg by mouth daily.    . ondansetron (  ZOFRAN) 4 MG tablet Take 1 tablet (4 mg total) by mouth every 8 (eight) hours as needed for nausea or vomiting. 10 tablet 0  . Psyllium (METAMUCIL FIBER PO) Take 1 Scoop by mouth daily.     Marland Kitchen RAPAFLO 8 MG CAPS capsule Take 8 mg by mouth every evening.   3  . sodium chloride (OCEAN) 0.65 % SOLN nasal spray Place  1 spray into both nostrils at bedtime as needed for congestion.     Marland Kitchen testosterone cypionate (DEPOTESTOTERONE CYPIONATE) 200 MG/ML injection Inject 200 mg into the muscle every 14 (fourteen) days. 0.75 mL    . TURMERIC PO Take 1,000 mg by mouth daily.    Marland Kitchen venlafaxine (EFFEXOR-XR) 150 MG 24 hr capsule Take 150 mg by mouth every morning.     . vitamin E 400 UNIT capsule Take 400 Units by mouth daily.     No current facility-administered medications for this visit.    ROS:   General:  No weight loss, Fever, chills  HEENT: No recent headaches, no nasal bleeding, no visual changes, no sore throat  Neurologic: No dizziness, blackouts, seizures. No recent symptoms of stroke or mini- stroke. No recent episodes of slurred speech, or temporary blindness.  Cardiac: No recent episodes of chest pain/pressure, no shortness of breath at rest.  No shortness of breath with exertion.  Denies history of atrial fibrillation or irregular heartbeat  Vascular: No history of rest pain in feet.  No history of claudication.  No history of non-healing ulcer, No history of DVT   Pulmonary: No home oxygen, no productive cough, no hemoptysis,  No asthma or wheezing  Musculoskeletal:  [X]  Arthritis, [X]  Low back pain,  [X]  Joint pain  Hematologic:No history of hypercoagulable state.  No history of easy bleeding.  No history of anemia  Gastrointestinal: No hematochezia or melena,  No gastroesophageal reflux, no trouble swallowing  Urinary: [ ]  chronic Kidney disease, [ ]  on HD - [ ]  MWF or [ ]  TTHS, [ ]  Burning with urination, [ ]  Frequent urination, [ ]  Difficulty urinating;   Skin: No rashes  Psychological: No history of anxiety,  No history of depression   Physical Examination  Vitals:   10/30/19 1243  BP: (!) 156/109  Pulse: 62  Resp: 20  Temp: 98.3 F (36.8 C)  SpO2: 96%  Weight: 233 lb (105.7 kg)  Height: 5\' 8"  (1.727 m)    Body mass index is 35.43 kg/m.  General:  Alert and oriented, no  acute distress HEENT: Normal Neck: No JVD Cardiac: Regular Rate and Rhythm Abdomen: Soft, non-tender, non-distended, no mass Skin: No rash, mild hemosiderin staining left medial ankle Extremity Pulses:  2+ radial, brachial, femoral, 1+ dorsalis pedis, 2+ posterior tibial pulses bilaterally Musculoskeletal: No deformity or edema  Neurologic: Upper and lower extremity motor 5/5 and symmetric  DATA:  Patient had bilateral ABIs performed today which were greater than 1 triphasic and normal bilaterally  ASSESSMENT: Patient symptoms are more consistent with peripheral neuropathy.  He has no clinical or objective signs of arterial occlusive disease.  He has palpable pulses on exam with normal ABIs.  He may have an element of venous hypertension that contributes to the skin changes in his cool but all of the color changes in his skin and his numbness and burning stinging and tingling is all related to neuropathy.  All this was discussed with the patient and his wife today.   PLAN: Patient can wear lightweight compression stockings if he  has trouble with swelling in his legs.  Otherwise he will follow up on an as-needed basis.   Ruta Hinds, MD [facility] Office: [phone]

## 2019-11-04 ENCOUNTER — Encounter: Payer: Self-pay | Admitting: Rheumatology

## 2019-11-04 ENCOUNTER — Other Ambulatory Visit: Payer: Self-pay

## 2019-11-04 ENCOUNTER — Ambulatory Visit (INDEPENDENT_AMBULATORY_CARE_PROVIDER_SITE_OTHER): Payer: Medicare Other | Admitting: Rheumatology

## 2019-11-04 VITALS — BP 171/113 | HR 61 | Resp 17 | Ht 68.0 in | Wt 239.0 lb

## 2019-11-04 DIAGNOSIS — Z87898 Personal history of other specified conditions: Secondary | ICD-10-CM

## 2019-11-04 DIAGNOSIS — Z5181 Encounter for therapeutic drug level monitoring: Secondary | ICD-10-CM

## 2019-11-04 DIAGNOSIS — M1A09X0 Idiopathic chronic gout, multiple sites, without tophus (tophi): Secondary | ICD-10-CM

## 2019-11-04 DIAGNOSIS — M1712 Unilateral primary osteoarthritis, left knee: Secondary | ICD-10-CM

## 2019-11-04 DIAGNOSIS — Z8719 Personal history of other diseases of the digestive system: Secondary | ICD-10-CM

## 2019-11-04 DIAGNOSIS — M47816 Spondylosis without myelopathy or radiculopathy, lumbar region: Secondary | ICD-10-CM

## 2019-11-04 DIAGNOSIS — M19072 Primary osteoarthritis, left ankle and foot: Secondary | ICD-10-CM

## 2019-11-04 DIAGNOSIS — Z8669 Personal history of other diseases of the nervous system and sense organs: Secondary | ICD-10-CM

## 2019-11-04 DIAGNOSIS — M797 Fibromyalgia: Secondary | ICD-10-CM

## 2019-11-04 DIAGNOSIS — Z96612 Presence of left artificial shoulder joint: Secondary | ICD-10-CM

## 2019-11-04 DIAGNOSIS — M19071 Primary osteoarthritis, right ankle and foot: Secondary | ICD-10-CM

## 2019-11-04 DIAGNOSIS — Z96651 Presence of right artificial knee joint: Secondary | ICD-10-CM

## 2019-11-04 DIAGNOSIS — M5030 Other cervical disc degeneration, unspecified cervical region: Secondary | ICD-10-CM

## 2019-11-04 DIAGNOSIS — M19041 Primary osteoarthritis, right hand: Secondary | ICD-10-CM

## 2019-11-04 DIAGNOSIS — Z8679 Personal history of other diseases of the circulatory system: Secondary | ICD-10-CM

## 2019-11-04 DIAGNOSIS — M19042 Primary osteoarthritis, left hand: Secondary | ICD-10-CM

## 2019-11-04 DIAGNOSIS — Z8639 Personal history of other endocrine, nutritional and metabolic disease: Secondary | ICD-10-CM

## 2019-11-05 LAB — CBC WITH DIFFERENTIAL/PLATELET
Absolute Monocytes: 623 cells/uL (ref 200–950)
Basophils Absolute: 38 cells/uL (ref 0–200)
Basophils Relative: 0.5 %
Eosinophils Absolute: 293 cells/uL (ref 15–500)
Eosinophils Relative: 3.9 %
HCT: 48 % (ref 38.5–50.0)
Hemoglobin: 16.6 g/dL (ref 13.2–17.1)
Lymphs Abs: 1658 cells/uL (ref 850–3900)
MCH: 33 pg (ref 27.0–33.0)
MCHC: 34.6 g/dL (ref 32.0–36.0)
MCV: 95.4 fL (ref 80.0–100.0)
MPV: 10.8 fL (ref 7.5–12.5)
Monocytes Relative: 8.3 %
Neutro Abs: 4890 cells/uL (ref 1500–7800)
Neutrophils Relative %: 65.2 %
Platelets: 221 10*3/uL (ref 140–400)
RBC: 5.03 10*6/uL (ref 4.20–5.80)
RDW: 13.1 % (ref 11.0–15.0)
Total Lymphocyte: 22.1 %
WBC: 7.5 10*3/uL (ref 3.8–10.8)

## 2019-11-05 LAB — URIC ACID: Uric Acid, Serum: 4.8 mg/dL (ref 4.0–8.0)

## 2019-11-05 NOTE — Progress Notes (Signed)
CBC is normal, uric acid is in desirable range.

## 2019-12-01 ENCOUNTER — Other Ambulatory Visit: Payer: Self-pay | Admitting: Rheumatology

## 2019-12-02 NOTE — Telephone Encounter (Signed)
Last Visit: 11/04/2019 Next Visit: 05/04/2020 Labs: 11/04/2019 CBC is normal, uric acid is in desirable range, 10/13/2019 CMP WNL  Current Dose per office note 11/04/2019: colchicine 0.6 mg 1 tablet by mouth BID. He takes colchicine only on as needed basis DX:  Idiopathic chronic gout of multiple sites without tophus   Okay to refill Colchicine?

## 2020-02-01 ENCOUNTER — Encounter: Payer: Self-pay | Admitting: Physician Assistant

## 2020-02-01 ENCOUNTER — Other Ambulatory Visit: Payer: Self-pay | Admitting: Physician Assistant

## 2020-02-01 DIAGNOSIS — U071 COVID-19: Secondary | ICD-10-CM

## 2020-02-01 DIAGNOSIS — N1831 Chronic kidney disease, stage 3a: Secondary | ICD-10-CM

## 2020-02-01 DIAGNOSIS — I10 Essential (primary) hypertension: Secondary | ICD-10-CM | POA: Insufficient documentation

## 2020-02-01 DIAGNOSIS — G5793 Unspecified mononeuropathy of bilateral lower limbs: Secondary | ICD-10-CM | POA: Insufficient documentation

## 2020-02-01 NOTE — Progress Notes (Signed)
I connected by phone with Victor [name] on 02/01/2020 at 9:23 AM to discuss the potential use of a new treatment for mild to moderate COVID-19 viral infection in non-hospitalized patients.  This patient is a 64 y.o. male that meets the FDA criteria for Emergency Use Authorization of COVID monoclonal antibody sotrovimab, casirivimab/imdevimab or bamlamivimab/estevimab.  Has a (+) direct SARS-CoV-2 viral test result  Has mild or moderate COVID-19   Is NOT hospitalized due to COVID-19  Is within 10 days of symptom onset  Has at least one of the high risk factor(s) for progression to severe COVID-19 and/or hospitalization as defined in EUA.  Specific high risk criteria : BMI > 25, Chronic Kidney Disease (CKD) and Cardiovascular disease or hypertension   I have spoken and communicated the following to the patient or parent/caregiver regarding COVID monoclonal antibody treatment:  1. FDA has authorized the emergency use for the treatment of mild to moderate COVID-19 in adults and pediatric patients with positive results of direct SARS-CoV-2 viral testing who are 84 years of age and older weighing at least 40 kg, and who are at high risk for progressing to severe COVID-19 and/or hospitalization.  2. The significant known and potential risks and benefits of COVID monoclonal antibody, and the extent to which such potential risks and benefits are unknown.  3. Information on available alternative treatments and the risks and benefits of those alternatives, including clinical trials.  4. Patients treated with COVID monoclonal antibody should continue to self-isolate and use infection control measures (e.g., wear mask, isolate, social distance, avoid sharing personal items, clean and disinfect "high touch" surfaces, and frequent handwashing) according to CDC guidelines.   5. The patient or parent/caregiver has the option to accept or refuse COVID monoclonal antibody treatment.  After reviewing  this information with the patient, the patient has agreed to receive one of the available covid 19 monoclonal antibodies and will be provided an appropriate fact sheet prior to infusion.  Sx onset 12/1. Set up for infusion on 12/8 @ 10:30am. Directions given to [facility]. Pt is aware that insurance will be charged an infusion fee. Pt is fully vaccinated. Will bring in copy of + test. Exposed to positive wife and having fevers and cough.   Angelena Form 02/01/2020 9:23 AM

## 2020-02-04 ENCOUNTER — Other Ambulatory Visit (HOSPITAL_COMMUNITY): Payer: Self-pay

## 2020-02-04 ENCOUNTER — Ambulatory Visit (HOSPITAL_COMMUNITY)
Admission: RE | Admit: 2020-02-04 | Discharge: 2020-02-04 | Disposition: A | Discharge status: Home / Self Care | Payer: Medicare Other | Source: Ambulatory Visit | Attending: Pulmonary Disease | Admitting: Pulmonary Disease

## 2020-02-04 DIAGNOSIS — I10 Essential (primary) hypertension: Secondary | ICD-10-CM | POA: Insufficient documentation

## 2020-02-04 DIAGNOSIS — N1831 Chronic kidney disease, stage 3a: Secondary | ICD-10-CM | POA: Diagnosis present

## 2020-02-04 DIAGNOSIS — Z23 Encounter for immunization: Secondary | ICD-10-CM | POA: Insufficient documentation

## 2020-02-04 DIAGNOSIS — U071 COVID-19: Secondary | ICD-10-CM | POA: Diagnosis present

## 2020-02-04 MED ORDER — SODIUM CHLORIDE 0.9 % IV SOLN: INTRAVENOUS | Status: DC | PRN

## 2020-02-04 MED ORDER — ALBUTEROL SULFATE HFA 108 (90 BASE) MCG/ACT IN AERS: 2.0000 | INHALATION_SPRAY | Freq: Once | RESPIRATORY_TRACT | Status: DC | PRN

## 2020-02-04 MED ORDER — SODIUM CHLORIDE 0.9 % IV SOLN: Freq: Once | INTRAVENOUS | Status: AC

## 2020-02-04 MED ORDER — FAMOTIDINE IN NACL 20-0.9 MG/50ML-% IV SOLN: 20.0000 mg | Freq: Once | INTRAVENOUS | Status: DC | PRN

## 2020-02-04 MED ORDER — DIPHENHYDRAMINE HCL 50 MG/ML IJ SOLN: 50.0000 mg | Freq: Once | INTRAMUSCULAR | Status: DC | PRN

## 2020-02-04 MED ORDER — METHYLPREDNISOLONE SODIUM SUCC 125 MG IJ SOLR: 125.0000 mg | Freq: Once | INTRAMUSCULAR | Status: DC | PRN

## 2020-02-04 MED ORDER — EPINEPHRINE 0.3 MG/0.3ML IJ SOAJ: 0.3000 mg | Freq: Once | INTRAMUSCULAR | Status: DC | PRN

## 2020-02-04 NOTE — Progress Notes (Signed)
Patient reviewed Fact Sheet for Patients, Parents, and Caregivers for Emergency Use Authorization (EUA) of REGEN-COV for the Treatment of Coronavirus. Patient also reviewed and is agreeable to the estimated cost of treatment. Patient is agreeable to proceed.    

## 2020-02-04 NOTE — Discharge Instructions (Signed)
10 Things You Can Do to Manage Your COVID-19 Symptoms at Home If you have possible or confirmed COVID-19: 1. Stay home from work and school. And stay away from other public places. If you must go out, avoid using any kind of public transportation, ridesharing, or taxis. 2. Monitor your symptoms carefully. If your symptoms get worse, call your healthcare provider immediately. 3. Get rest and stay hydrated. 4. If you have a medical appointment, call the healthcare provider ahead of time and tell them that you have or may have COVID-19. 5. For medical emergencies, call 911 and notify the dispatch personnel that you have or may have COVID-19. 6. Cover your cough and sneezes with a tissue or use the inside of your elbow. 7. Wash your hands often with soap and water for at least 20 seconds or clean your hands with an alcohol-based hand sanitizer that contains at least 60% alcohol. 8. As much as possible, stay in a specific room and away from other people in your home. Also, you should use a separate bathroom, if available. If you need to be around other people in or outside of the home, wear a mask. 9. Avoid sharing personal items with other people in your household, like dishes, towels, and bedding. 10. Clean all surfaces that are touched often, like counters, tabletops, and doorknobs. Use household cleaning sprays or wipes according to the label instructions. cdc.gov/coronavirus 08/28/2018 This information is not intended to replace advice given to you by your health care provider. Make sure you discuss any questions you have with your health care provider. Document Revised: 01/30/2019 Document Reviewed: 01/30/2019 Elsevier Patient Education  2020 Elsevier Inc. What types of side effects do monoclonal antibody drugs cause?  Common side effects  In general, the more common side effects caused by monoclonal antibody drugs include: . Allergic reactions, such as hives or itching . Flu-like signs and  symptoms, including chills, fatigue, fever, and muscle aches and pains . Nausea, vomiting . Diarrhea . Skin rashes . Low blood pressure   The CDC is recommending patients who receive monoclonal antibody treatments wait at least 90 days before being vaccinated.  Currently, there are no data on the safety and efficacy of mRNA COVID-19 vaccines in persons who received monoclonal antibodies or convalescent plasma as part of COVID-19 treatment. Based on the estimated half-life of such therapies as well as evidence suggesting that reinfection is uncommon in the 90 days after initial infection, vaccination should be deferred for at least 90 days, as a precautionary measure until additional information becomes available, to avoid interference of the antibody treatment with vaccine-induced immune responses. If you have any questions or concerns after the infusion please call the Advanced Practice Provider on call at [phone]. This number is ONLY intended for your use regarding questions or concerns about the infusion post-treatment side-effects.  Please do not provide this number to others for use. For return to work notes please contact your primary care provider.   If someone you know is interested in receiving treatment please have them call the COVID hotline at [phone].   

## 2020-02-04 NOTE — Progress Notes (Signed)
  Diagnosis: COVID-19  Physician: Dr. Joya Gaskins  Procedure: [facility] Med: casirivimab\imdevimab infusion - Provided patient with casirivimab\imdevimab fact sheet for patients, parents and caregivers prior to infusion.  Complications: No immediate complications noted.  Discharge: Discharged home   Hulan Fess 02/04/2020

## 2020-02-10 ENCOUNTER — Other Ambulatory Visit: Payer: Self-pay | Admitting: Rheumatology

## 2020-02-10 NOTE — Telephone Encounter (Signed)
Last Visit: 11/04/2019 Next Visit: 05/04/2020 Labs: 11/04/2019 CBC is normal, uric acid is in desirable range, 10/13/2019 CMP WNL  Current Dose per office note 11/04/2019: colchicine 0.6 mg 1 tablet by mouth BID. He takes colchicine only on as needed basis DX: Idiopathic chronic gout of multiple sites without tophus  Okay to refill per Dr. Estanislado Pandy

## 2020-04-20 NOTE — Progress Notes (Signed)
Office Visit Note  Patient: Victor [name]             Date of Birth: [birth year]           MRN: [id number]             PCP: Antony Contras, MD Referring: Antony Contras, MD Visit Date: 05/04/2020 Occupation: @GUAROCC @  Subjective:  Other (Patient would like to Discuss tapering off of gabapentin )   History of Present Illness: Victor [name] is a 65 y.o. male with a history of osteoarthritis, degenerative disc disease, gout and fibromyalgia syndrome.  He states he continues to have pain and discomfort in multiple joints.  He had left shoulder replacement last year.  He went through physical therapy which was helpful.  He states he continues to have some discomfort in his right shoulder and left knee.  He continues to have discomfort in his neck and lower back.                                                                                                                                                                                                                                    Activities of Daily Living:  Patient reports morning stiffness for less than 5 minutes.   Patient Reports nocturnal pain.  Difficulty dressing/grooming: Reports Difficulty climbing stairs: Denies Difficulty getting out of chair: Denies Difficulty using hands for taps, buttons, cutlery, and/or writing: Reports  Review of Systems  Constitutional: Negative for fatigue.  HENT: Negative for mouth sores, mouth dryness and nose dryness.   Eyes: Positive for dryness. Negative for pain and itching.  Respiratory: Negative for shortness of breath and difficulty breathing.   Cardiovascular: Negative for chest pain and palpitations.  Gastrointestinal: Negative for blood in stool, constipation and diarrhea.  Endocrine: Negative for increased urination.  Genitourinary: Negative for difficulty urinating.  Musculoskeletal: Positive for arthralgias, joint pain, joint swelling and morning stiffness. Negative  for myalgias, muscle tenderness and myalgias.  Skin: Positive for color change. Negative for rash and redness.  Allergic/Immunologic: Negative for susceptible to infections.  Neurological: Positive for numbness and headaches. Negative for dizziness, memory loss and weakness.  Hematological: Positive for bruising/bleeding tendency.  Psychiatric/Behavioral: Negative for confusion.    PMFS History:  Patient Active Problem List   Diagnosis Date Noted  . Hypertension   . Neuropathy of both feet   . Lumbar foraminal stenosis 08/06/2017  .  HNP (herniated nucleus pulposus), thoracic 09/05/2016  . Chronic kidney disease (CKD) 04/26/2016  . DJD (degenerative joint disease), cervical 04/26/2016  . Essential hypertension 04/26/2016  . Dyslipidemia 04/26/2016  . Sleep apnea 04/26/2016  . Fibromyalgia 04/18/2016  . Idiopathic chronic gout of multiple sites without tophus 04/18/2016  . Spondylosis of lumbar region without myelopathy or radiculopathy 04/18/2016  . Primary osteoarthritis of both knees 04/18/2016  . Lumbar herniated disc 07/22/2015  . Morbid obesity ([place]) 02/25/2014  . S/P right TKA 02/24/2014  . Heartburn 01/10/2011    Past Medical History:  Diagnosis Date  . Anxiety   . Arthritis    "all over" (04/15/2013)  . Back pain, chronic   . Chronic neck pain   . DDD (degenerative disc disease), cervical   . DDD (degenerative disc disease), lumbar   . Fibromyalgia   . GERD (gastroesophageal reflux disease)   . Gout   . Headache   . High cholesterol   . History of bronchitis   . Hypertension   . Leg cramps    Right greater than left  . Melanoma ([place])    Nose  . MRSA infection   . Neuropathy of both feet   . Pre-diabetes   . Radiculopathy of lumbar region   . Sleep apnea    "don't use CPAP" (04/15/2013)    Family History  Problem Relation Age of Onset  . Hypertension Father   . Cancer Mother        breast   . Heart Problems Mother   . Alzheimer's disease Mother   .  Allergies Daughter   . Interstitial cystitis Daughter    Past Surgical History:  Procedure Laterality Date  . ANTERIOR CERVICAL DECOMP/DISCECTOMY FUSION  2000's X3   "had 3 surgeries on my front neck" (04/15/2013)  . ANTERIOR LAT LUMBAR FUSION Left 08/06/2017   Procedure: LEFT LUMBAR TWO-THREE ANTERIOR LATERAL LUMBAR FUSION WITH PERCUTANEOUS SCREWS;  Surgeon: Earnie Larsson, MD;  Location: [place];  Service: Neurosurgery;  Laterality: Left;  . APPENDECTOMY  ~ 2010  . CARPAL TUNNEL RELEASE Left 1990's  . CATARACT EXTRACTION, BILATERAL    . COLONOSCOPY    . ear drum surgery    . EYE SURGERY Right   . FLEXIBLE SIGMOIDOSCOPY  12/21/2011   Procedure: FLEXIBLE SIGMOIDOSCOPY;  Surgeon: Lear Ng, MD;  Location: [facility];  Service: Endoscopy;  Laterality: N/A;  . HAND TENDON SURGERY Left ~ 1978 X2-1980's X3   "had 5 ORs after laceration"  . HOT HEMOSTASIS  12/21/2011   Procedure: HOT HEMOSTASIS (ARGON PLASMA COAGULATION/BICAP);  Surgeon: Lear Ng, MD;  Location: [facility];  Service: Endoscopy;  Laterality: N/A;  . INGUINAL HERNIA REPAIR Left 04/15/2013   Procedure: LAPAROSCOPIC REPAIR OF INCARCERATED LEFT INGUINAL HERNIA;  Surgeon: Gayland Curry, MD;  Location: [place];  Service: General;  Laterality: Left;  . INGUINAL HERNIA REPAIR Bilateral 1970's - 1984  . INGUINAL HERNIA REPAIR Left 04/15/2013  . INSERTION OF MESH Left 04/15/2013   Procedure: INSERTION OF MESH;  Surgeon: Gayland Curry, MD;  Location: [place];  Service: General;  Laterality: Left;  . KNEE ARTHROSCOPY Right 1990's  . LAPAROSCOPY N/A 04/15/2013   Procedure: LAPAROSCOPY DIAGNOSTIC;  Surgeon: Gayland Curry, MD;  Location: [place];  Service: General;  Laterality: N/A;  . LUMBAR LAMINECTOMY/DECOMPRESSION MICRODISCECTOMY Right 07/22/2015   Procedure: Right Lumbar Two-Three Microdiscectomy;  Surgeon: Earnie Larsson, MD;  Location: [place] [facility];  Service: Neurosurgery;  Laterality: Right;  . LUMBAR  LAMINECTOMY/DECOMPRESSION  MICRODISCECTOMY Right 09/05/2016   Procedure: Microdiscectomy - right - T12-L1;  Surgeon: Earnie Larsson, MD;  Location: [place];  Service: Neurosurgery;  Laterality: Right;  . NASAL SEPTUM SURGERY Bilateral 2014   "removed polyps & infection"  . POLYPECTOMY  01/10/2011   Procedure: POLYPECTOMY;  Surgeon: Lear Ng, MD;  [facility];  Service: Endoscopy;  Laterality: N/A;  . POSTERIOR LUMBAR FUSION  11/1995   "ray cages" (04/15/2013)  . SHOULDER ARTHROSCOPY Bilateral    "twice each"  . TONSILLECTOMY  1960's  . TOTAL KNEE ARTHROPLASTY Right 02/24/2014   Procedure: TOTAL RIGHT KNEE ARTHROPLASTY;  Surgeon: Mauri Pole, MD;  Location[facility];  Service: Orthopedics;  Laterality: Right;  . TOTAL SHOULDER ARTHROPLASTY Left 09/18/2019   Procedure: TOTAL SHOULDER ARTHROPLASTY;  Surgeon: Justice Britain, MD;  Location: [facility];  Service: Orthopedics;  Laterality: Left;  153min  . UPPER GI ENDOSCOPY    . VASECTOMY  1988  . WISDOM TOOTH EXTRACTION  2000's   Social History   Social History Narrative  . Not on file   Immunization History  Administered Date(s) Administered  . Influenza Whole 11/08/2011  . Influenza-Unspecified 02/27/2013  . PFIZER(Purple Top)SARS-COV-2 Vaccination 06/23/2019, 07/14/2019     Objective: Vital Signs: BP (!) 135/92 (BP Location: Left Arm, Patient Position: Sitting, Cuff Size: Large)   Pulse 93   Resp 17   Ht 5' 7.5" (1.715 m)   Wt 227 lb 3.2 oz (103.1 kg)   BMI 35.06 kg/m    Physical Exam Vitals and nursing note reviewed.  Constitutional:      Appearance: He is well-developed and well-nourished.  HENT:     Head: Normocephalic and atraumatic.  Eyes:     Extraocular Movements: EOM normal.     Conjunctiva/sclera: Conjunctivae normal.     Pupils: Pupils are equal, round, and reactive to light.  Cardiovascular:     Rate and Rhythm: Normal rate and regular rhythm.     Heart sounds: Normal heart sounds.  Pulmonary:     Effort: Pulmonary  effort is normal.     Breath sounds: Normal breath sounds.  Abdominal:     General: Bowel sounds are normal.     Palpations: Abdomen is soft.  Musculoskeletal:     Cervical back: Normal range of motion and neck supple.  Skin:    General: Skin is warm and dry.     Capillary Refill: Capillary refill takes less than 2 seconds.  Neurological:     Mental Status: He is alert and oriented to person, place, and time.  Psychiatric:        Mood and Affect: Mood and affect normal.        Behavior: Behavior normal.      Musculoskeletal Exam: He had limited range of motion of the cervical and lumbar spine.  He had discomfort with range of motion of his left shoulder joint and limitation of range of motion with internal rotation.  Elbow joints, wrist joints with good range of motion.  He has PIP and DIP thickening in his hands consistent with osteoarthritis.  Right knee joint is replaced without any swelling.  Left knee joint was in good range of motion with crepitus.  There was no tenderness over ankles or MTPs.  CDAI Exam: CDAI Score: - Patient Global: -; Provider Global: - Swollen: -; Tender: - Joint Exam 05/04/2020   No joint exam has been documented for this visit   There is currently no information documented  on the homunculus. Go to the Rheumatology activity and complete the homunculus joint exam.  Investigation: No additional findings.  Imaging: No results found.  Recent Labs: Lab Results  Component Value Date   WBC 7.5 11/04/2019   HGB 16.6 11/04/2019   PLT 221 11/04/2019   NA 138 09/09/2019   K 5.2 (H) 09/09/2019   CL 102 09/09/2019   CO2 26 09/09/2019   GLUCOSE 143 (H) 09/09/2019   BUN 18 09/09/2019   CREATININE 1.05 09/09/2019   BILITOT 0.8 04/15/2013   ALKPHOS 46 04/15/2013   AST 71 (H) 04/15/2013   ALT 40 04/15/2013   PROT 7.5 04/15/2013   ALBUMIN 4.0 04/15/2013   CALCIUM 9.7 09/09/2019   GFRAA >60 09/09/2019    Speciality Comments: No specialty comments  available.  Procedures:  No procedures performed Allergies: Tetracyclines & related and Niacin and related   Assessment / Plan:     Visit Diagnoses: Idiopathic chronic gout of multiple sites without tophus - allopurinol 450 mg po daily and colchicine 0.6 mg 1 tablet by mouth BID as needed. uric acid: 11/04/2019 4.8.  Patient denies having any gout flare.  He had labs yesterday at his PCPs office.  Patient states he had CBC with differential with CMP with GFR and uric acid.  He will forward lab results to [place].  Status post shoulder replacement, left - July 22,2021 by Dr. Onnie Graham.  He has some limitation with discomfort.  He has been doing exercises on a regular basis.  Primary osteoarthritis of both hands-joint protection muscle strengthening was discussed.  Primary osteoarthritis of left knee-he continues to have discomfort.  He states he will require surgery in the future.  History of total right knee replacement (TKR)-doing well.  Primary osteoarthritis of both feet-he had no tenderness on examination today.  DDD (degenerative disc disease), cervical-he has limited and painful range of motion of his cervical spine.  Spondylosis of lumbar region without myelopathy or radiculopathy - he is followed by neurosurgery.  Fibromyalgia - He is on gabapentin.  He is currently on gabapentin 100 mg p.o. twice daily and he will be tapering it off over the next 2 months.  History of hypertension-his blood pressure is elevated.  He has been advised to monitor blood pressure closely and follow-up with his PCP.  History of insomnia  History of hypercholesterolemia  History of hiatal hernia  History of sleep apnea  History of gastroesophageal reflux (GERD)  Orders: No orders of the defined types were placed in this encounter.  No orders of the defined types were placed in this encounter.     Follow-Up Instructions: Return in about 6 months (around 11/04/2020) for Osteoarthritis,  Gout.   Bo Merino, MD  Note - This record has been created using Editor, commissioning.  Chart creation errors have been sought, but may not always  have been located. Such creation errors do not reflect on  the standard of medical care.

## 2020-05-04 ENCOUNTER — Encounter: Payer: Self-pay | Admitting: Rheumatology

## 2020-05-04 ENCOUNTER — Other Ambulatory Visit: Payer: Self-pay

## 2020-05-04 ENCOUNTER — Ambulatory Visit (INDEPENDENT_AMBULATORY_CARE_PROVIDER_SITE_OTHER): Payer: Medicare Other | Admitting: Rheumatology

## 2020-05-04 VITALS — BP 135/92 | HR 93 | Resp 17 | Ht 67.5 in | Wt 227.2 lb

## 2020-05-04 DIAGNOSIS — M1A09X0 Idiopathic chronic gout, multiple sites, without tophus (tophi): Secondary | ICD-10-CM

## 2020-05-04 DIAGNOSIS — Z8719 Personal history of other diseases of the digestive system: Secondary | ICD-10-CM

## 2020-05-04 DIAGNOSIS — Z96651 Presence of right artificial knee joint: Secondary | ICD-10-CM

## 2020-05-04 DIAGNOSIS — M19041 Primary osteoarthritis, right hand: Secondary | ICD-10-CM | POA: Diagnosis not present

## 2020-05-04 DIAGNOSIS — Z96612 Presence of left artificial shoulder joint: Secondary | ICD-10-CM

## 2020-05-04 DIAGNOSIS — M1712 Unilateral primary osteoarthritis, left knee: Secondary | ICD-10-CM | POA: Diagnosis not present

## 2020-05-04 DIAGNOSIS — Z8639 Personal history of other endocrine, nutritional and metabolic disease: Secondary | ICD-10-CM

## 2020-05-04 DIAGNOSIS — M797 Fibromyalgia: Secondary | ICD-10-CM

## 2020-05-04 DIAGNOSIS — M19071 Primary osteoarthritis, right ankle and foot: Secondary | ICD-10-CM

## 2020-05-04 DIAGNOSIS — Z8669 Personal history of other diseases of the nervous system and sense organs: Secondary | ICD-10-CM

## 2020-05-04 DIAGNOSIS — M5030 Other cervical disc degeneration, unspecified cervical region: Secondary | ICD-10-CM

## 2020-05-04 DIAGNOSIS — M47816 Spondylosis without myelopathy or radiculopathy, lumbar region: Secondary | ICD-10-CM

## 2020-05-04 DIAGNOSIS — M19072 Primary osteoarthritis, left ankle and foot: Secondary | ICD-10-CM

## 2020-05-04 DIAGNOSIS — Z87898 Personal history of other specified conditions: Secondary | ICD-10-CM

## 2020-05-04 DIAGNOSIS — Z8679 Personal history of other diseases of the circulatory system: Secondary | ICD-10-CM

## 2020-05-04 DIAGNOSIS — M19042 Primary osteoarthritis, left hand: Secondary | ICD-10-CM

## 2020-05-05 ENCOUNTER — Telehealth: Payer: Self-pay | Admitting: *Deleted

## 2020-05-05 NOTE — Telephone Encounter (Signed)
Labs received from [facility] on: 3/7/2022c Reviewed by:Hazel Sams, PA-C  Labs drawn:CBC w/Diff, CMP, TSH, Uric Acid, Lipid Panel w/reflex  Results: HGB 17.5 MCV 97.8  Glucose 113 Sodium 135 AST 49 Chloride 97 Cholesterol 270 CHOL/HDL 5.2 Triglyceride 240 NHDL 217 LDL 172 Uric acid within desirable range  Patient it taking colchicine 0.6 mg 1 tablet daily and allopurinol 300 mg 1 1/2 tablets daily.

## 2020-05-14 ENCOUNTER — Other Ambulatory Visit: Payer: Self-pay | Admitting: Neurosurgery

## 2020-05-17 ENCOUNTER — Other Ambulatory Visit: Payer: Self-pay | Admitting: Rheumatology

## 2020-05-18 NOTE — Telephone Encounter (Signed)
Next Visit: 11/04/2020  Last Visit: 05/04/2020  Last Fill: 02/10/2020  DX: Idiopathic chronic gout of multiple sites without tophus -  Current Dose per office note 05/04/2020, colchicine 0.6 mg 1 tablet by mouth BID as needed  Labs: Labs received from [facility] on: 3/7/2022c Reviewed by:Hazel Sams, PA-C  Labs drawn:CBC w/Diff, CMP, TSH, Uric Acid, Lipid Panel w/reflex  Results: HGB 17.5 MCV 97.8  Glucose 113 Sodium 135 AST 49 Chloride 97 Cholesterol 270 CHOL/HDL 5.2 Triglyceride 240 NHDL 217 LDL 172 Uric acid within desirable range  Okay to refill Colchicine?

## 2020-05-19 ENCOUNTER — Other Ambulatory Visit: Payer: Self-pay | Admitting: Family Medicine

## 2020-05-19 DIAGNOSIS — S0990XA Unspecified injury of head, initial encounter: Secondary | ICD-10-CM

## 2020-05-19 NOTE — Progress Notes (Signed)
Surgical Instructions   Your procedure is scheduled on Monday, March 28th.  Report to [facility] Main Entrance "A" at 7:45 A.M., then check in with the Admitting office.  Call this number if you have problems the morning of surgery:  [phone]   If you have any questions prior to your surgery date call [phone]: Open Monday-Friday 8am-4pm   Remember:  Do not eat or drink after midnight the night before your surgery   Take these medicines the morning of surgery with A SIP OF WATER  allopurinol (ZYLOPRIM)  atenolol (TENORMIN)  fluticasone (FLONASE)/nasal spray gabapentin (NEURONTIN) omeprazole (PRILOSEC) . venlafaxine (EFFEXOR-XR)  If needed: acetaminophen (TYLENOL), ALPRAZolam (XANAX), carboxymethylcellulose (REFRESH PLUS)/eye drops, colchicine, diazepam (VALIUM), Oxycodone HCl         As of today, STOP taking any Aspirin (unless otherwise instructed by your surgeon) Aleve, Naproxen, Ibuprofen, Motrin, Advil, Goody's, BC's, all herbal medications, fish oil, and all vitamins.                     Do not wear jewelry.            Do not wear lotions, powders, colognes, or deodorant.             Men may shave face and neck.            Do not bring valuables to the hospital.            [facility] is not responsible for any belongings or valuables.  Do NOT Smoke (Tobacco/Vaping) or drink Alcohol 24 hours prior to your procedure If you use a CPAP at night, you may bring all equipment for your overnight stay.   Contacts, glasses, dentures or bridgework may not be worn into surgery, please bring cases for these belongings   For patients admitted to the hospital, discharge time will be determined by your treatment team.   Patients discharged the day of surgery will not be allowed to drive home, and someone needs to stay with them for 24 hours.  Special instructions:   [facility]- Preparing For Surgery  Before surgery, you can play an important role. Because skin is not sterile,  your skin needs to be as free of germs as possible. You can reduce the number of germs on your skin by washing with CHG (chlorahexidine gluconate) Soap before surgery.  CHG is an antiseptic cleaner which kills germs and bonds with the skin to continue killing germs even after washing.    Oral Hygiene is also important to reduce your risk of infection.  Remember - BRUSH YOUR TEETH THE MORNING OF SURGERY WITH YOUR REGULAR TOOTHPASTE  Please do not use if you have an allergy to CHG or antibacterial soaps. If your skin becomes reddened/irritated stop using the CHG.  Do not shave (including legs and underarms) for at least 48 hours prior to first CHG shower. It is OK to shave your face.  Please follow these instructions carefully.   1. Shower the NIGHT BEFORE SURGERY and the MORNING OF SURGERY  2. If you chose to wash your hair, wash your hair first as usual with your normal shampoo. After you shampoo, rinse your hair and body thoroughly to remove the shampoo.  3. Wash Face and genitals (private parts) with your normal soap.    4. Use CHG Soap as you would any other liquid soap. You can apply CHG directly to the skin and wash gently with a scrungie or a clean washcloth.  5. Apply the CHG Soap to your body ONLY FROM THE NECK DOWN.  Do not use on open wounds or open sores. Avoid contact with your eyes, ears, mouth and genitals (private parts). Wash Face and genitals (private parts)  with your normal soap.   6. Wash thoroughly, paying special attention to the area where your surgery will be performed.  7. Thoroughly rinse your body with warm water from the neck down.  8. DO NOT shower/wash with your normal soap after using and rinsing off the CHG Soap.  9. Pat yourself dry with a CLEAN TOWEL.  10. Wear CLEAN PAJAMAS to bed the night before surgery  11. Place CLEAN SHEETS on your bed the night before your surgery  12. DO NOT SLEEP WITH PETS.  Day of Surgery: Shower with CHG soap Wear  Clean/Comfortable clothing the morning of surgery Do not apply any deodorants/lotions.   Remember to brush your teeth WITH YOUR REGULAR TOOTHPASTE.   Please read over the following fact sheets that you were given.

## 2020-05-20 ENCOUNTER — Encounter (HOSPITAL_COMMUNITY)
Admission: RE | Admit: 2020-05-20 | Discharge: 2020-05-20 | Disposition: A | Discharge status: Home / Self Care | Payer: Medicare Other | Source: Ambulatory Visit | Attending: Neurosurgery | Admitting: Neurosurgery

## 2020-05-20 ENCOUNTER — Inpatient Hospital Stay: Admission: RE | Admit: 2020-05-20 | Payer: Medicare Other | Source: Ambulatory Visit

## 2020-05-20 ENCOUNTER — Other Ambulatory Visit: Payer: Self-pay

## 2020-05-20 ENCOUNTER — Encounter (HOSPITAL_COMMUNITY): Payer: Self-pay

## 2020-05-20 ENCOUNTER — Ambulatory Visit
Admission: RE | Admit: 2020-05-20 | Discharge: 2020-05-20 | Disposition: A | Discharge status: Home / Self Care | Payer: Medicare Other | Source: Ambulatory Visit | Attending: Family Medicine | Admitting: Family Medicine

## 2020-05-20 DIAGNOSIS — Z01812 Encounter for preprocedural laboratory examination: Secondary | ICD-10-CM | POA: Insufficient documentation

## 2020-05-20 DIAGNOSIS — Z20822 Contact with and (suspected) exposure to covid-19: Secondary | ICD-10-CM | POA: Insufficient documentation

## 2020-05-20 DIAGNOSIS — S0990XA Unspecified injury of head, initial encounter: Secondary | ICD-10-CM

## 2020-05-20 HISTORY — DX: Peripheral vascular disease, unspecified: I73.9

## 2020-05-20 LAB — COMPREHENSIVE METABOLIC PANEL
ALT: 32 U/L (ref 0–44)
AST: 38 U/L (ref 15–41)
Albumin: 4 g/dL (ref 3.5–5.0)
Alkaline Phosphatase: 46 U/L (ref 38–126)
Anion gap: 8 (ref 5–15)
BUN: 9 mg/dL (ref 8–23)
CO2: 25 mmol/L (ref 22–32)
Calcium: 9.6 mg/dL (ref 8.9–10.3)
Chloride: 105 mmol/L (ref 98–111)
Creatinine, Ser: 1 mg/dL (ref 0.61–1.24)
GFR, Estimated: >60 mL/min (ref >60)
Glucose, Bld: 129 mg/dL — ABNORMAL HIGH (ref 70–99)
Potassium: 5 mmol/L (ref 3.5–5.1)
Sodium: 138 mmol/L (ref 135–145)
Total Bilirubin: 1.2 mg/dL (ref 0.3–1.2)
Total Protein: 7.2 g/dL (ref 6.5–8.1)

## 2020-05-20 LAB — CBC WITH DIFFERENTIAL/PLATELET
Abs Immature Granulocytes: 0.02 10*3/uL (ref 0.00–0.07)
Basophils Absolute: 0 10*3/uL (ref 0.0–0.1)
Basophils Relative: 1 %
Eosinophils Absolute: 0.4 10*3/uL (ref 0.0–0.5)
Eosinophils Relative: 6 %
HCT: 49.8 % (ref 39.0–52.0)
Hemoglobin: 17.6 g/dL — ABNORMAL HIGH (ref 13.0–17.0)
Immature Granulocytes: 0 %
Lymphocytes Relative: 23 %
Lymphs Abs: 1.6 10*3/uL (ref 0.7–4.0)
MCH: 33.5 pg (ref 26.0–34.0)
MCHC: 35.3 g/dL (ref 30.0–36.0)
MCV: 94.9 fL (ref 80.0–100.0)
Monocytes Absolute: 0.7 10*3/uL (ref 0.1–1.0)
Monocytes Relative: 9 %
Neutro Abs: 4.3 10*3/uL (ref 1.7–7.7)
Neutrophils Relative %: 61 %
Platelets: 218 10*3/uL (ref 150–400)
RBC: 5.25 MIL/uL (ref 4.22–5.81)
RDW: 13.2 % (ref 11.5–15.5)
WBC: 7 10*3/uL (ref 4.0–10.5)
nRBC: 0 % (ref 0.0–0.2)

## 2020-05-20 LAB — GLUCOSE, CAPILLARY: Glucose-Capillary: 125 mg/dL — ABNORMAL HIGH (ref 70–99)

## 2020-05-20 LAB — SARS CORONAVIRUS 2 (TAT 6-24 HRS): SARS Coronavirus 2: NEGATIVE

## 2020-05-20 LAB — SURGICAL PCR SCREEN
MRSA, PCR: NEGATIVE
Staphylococcus aureus: NEGATIVE

## 2020-05-20 NOTE — Progress Notes (Signed)
PCP - Antony Contras Cardiologist - denies Denies any other specialist  PPM/ICD - denies   Chest x-ray - n/a EKG - 05/20/20 Stress Test - over 10 years ago ECHO - denies Cardiac Cath - denies  Sleep Study - yes CPAP - yes but doesn't wear   Patient instructed to hold all Aspirin, NSAID's, herbal medications, fish oil and vitamins 7 days prior to surgery.   ERAS Protcol -no   COVID TEST- 05/20/20   Anesthesia review: yes, BP at PAT 166/110. Jeneen Rinks PA-C notified  Patient denies shortness of breath, fever, cough and chest pain at PAT appointment   All instructions explained to the patient, with a verbal understanding of the material. Patient agrees to go over the instructions while at home for a better understanding. Patient also instructed to self quarantine after being tested for COVID-19. The opportunity to ask questions was provided.

## 2020-05-21 NOTE — Anesthesia Preprocedure Evaluation (Addendum)
Anesthesia Evaluation  Patient identified by MRN, date of birth, ID band Patient awake    Reviewed: Allergy & Precautions, NPO status , Patient's Chart, lab work & pertinent test results  History of Anesthesia Complications Negative for: history of anesthetic complications  Airway Mallampati: II  TM Distance: >3 FB Neck ROM: Full    Dental no notable dental hx.    Pulmonary sleep apnea , former smoker,    Pulmonary exam normal        Cardiovascular hypertension, Pt. on medications + Peripheral Vascular Disease  Normal cardiovascular exam     Neuro/Psych  Headaches, Anxiety Lumbar radiculopathy    GI/Hepatic Neg liver ROS, GERD  Controlled and Medicated,  Endo/Other  negative endocrine ROS  Renal/GU Renal InsufficiencyRenal disease  negative genitourinary   Musculoskeletal  (+) Arthritis , Fibromyalgia -, narcotic dependent  Abdominal   Peds  Hematology negative hematology ROS (+)   Anesthesia Other Findings Day of surgery medications reviewed with patient.  Reproductive/Obstetrics negative OB ROS                           Anesthesia Physical Anesthesia Plan  ASA: III  Anesthesia Plan: General   Post-op Pain Management:    Induction: Intravenous  PONV Risk Score and Plan: 3 and Treatment may vary due to age or medical condition, Ondansetron, Dexamethasone and Midazolam  Airway Management Planned: Oral ETT  Additional Equipment: None  Intra-op Plan:   Post-operative Plan: Extubation in OR  Informed Consent: I have reviewed the patients History and Physical, chart, labs and discussed the procedure including the risks, benefits and alternatives for the proposed anesthesia with the patient or authorized representative who has indicated his/her understanding and acceptance.     Dental advisory given  Plan Discussed with: CRNA  Anesthesia Plan Comments: (See PAT note written  05/21/2020 by Myra Gianotti, PA-C. )      Anesthesia Quick Evaluation

## 2020-05-21 NOTE — Progress Notes (Signed)
Anesthesia Chart Review:  Case: [id number] Date/Time: 05/24/20 0935   Procedure: Laminectomy and Foraminotomy - left - C2-C3 (Left ) - 3C   Anesthesia type: General   Pre-op diagnosis: Stenosis   Location: [facility] OR ROOM 18 / [facility]   Surgeons: Earnie Larsson, MD      DISCUSSION: Patient is a 65 year old male scheduled for the above procedure.  History includes former smoker (quit 01/08/10), HTN, hypercholesterolemia, OSA (does not use CPAP), prediabetes, GERD, gout, neuropathy, melanoma (nose), PVD, chronic back/neck pain, back surgery (right L2-3 lami/microdiscectomy 07/22/15; right T12-L1 microdiscectomy 09/05/16; L2-3 anterolateral fusion 08/06/17), neck surgery (ACDF 4-6; C3-4 ACDF with removal of anterior plat C4-6 12/11/01), TKR (right 02/24/14), left total shoulder arthroplasty (09/18/19). COVID-19 02/02/20 (s/p casirivimab\imdevimab infusion 02/04/20).  He had a head CT done on 05/20/20. From review of records, it looks like this was done due to a fall 05/01/20 with headache and some dizziness. He was seen at [facility] on 05/13/20. The head CT has not been read yet by the radiologist, but I did call Dr. Marchelle Folks office and let Lorriane Shire know that one was done. She will let Dr. Annette Stable know, so he can review report/images to evaluate for any acute findings prior to surgery. I also notified Lorriane Shire of patient's elevated BP readings. In review of most recent BP trends, DBP has mostly been < 100.     05/20/2020 presurgical COVID-19 test negative.  VS: BP (!) 158/100   Pulse 63   Temp 36.7 C (Oral)   Resp 18   Ht 5' 7.5" (1.715 m)   Wt 101.6 kg   SpO2 100%   BMI 34.57 kg/m  BP 143/95 05/13/20 at Dr. Marchelle Folks office.  BP Readings from Last 3 Encounters:  05/20/20 (!) 158/100  05/04/20 (!) 135/92  02/04/20 (!) 138/100    PROVIDERS: Antony Contras, MD is PCP  Bo Merino, MD is rheumatologist   LABS: Labs reviewed: Acceptable for surgery. (all labs ordered are listed, but only abnormal  results are displayed)  Labs Reviewed  GLUCOSE, CAPILLARY - Abnormal; Notable for the following components:      Result Value   Glucose-Capillary 125 (*)    All other components within normal limits  CBC WITH DIFFERENTIAL/PLATELET - Abnormal; Notable for the following components:   Hemoglobin 17.6 (*)    All other components within normal limits  COMPREHENSIVE METABOLIC PANEL - Abnormal; Notable for the following components:   Glucose, Bld 129 (*)    All other components within normal limits  SURGICAL PCR SCREEN  SARS CORONAVIRUS 2 (TAT 6-24 HRS)     IMAGES: CT Head 05/20/20 (ordered by Marlane Mingle, MD for history of fall, hit back of head 05/01/20): In process. See DISCUSSION.   EKG: 05/20/20: Sinus rhythm with first-degree AV block.  Isolated Q-wave in lead III.   CV: Reported a stress test over 10 years ago.  Past Medical History:  Diagnosis Date  . Anxiety   . Arthritis    "all over" (04/15/2013)  . Back pain, chronic   . Chronic neck pain   . DDD (degenerative disc disease), cervical   . DDD (degenerative disc disease), lumbar   . Fibromyalgia   . GERD (gastroesophageal reflux disease)   . Gout   . Headache   . High cholesterol   . History of bronchitis   . Hypertension   . Leg cramps    Right greater than left  . Melanoma ([place])    Nose  . MRSA infection   .  Neuropathy of both feet   . Peripheral vascular disease ([place])   . Pre-diabetes   . Radiculopathy of lumbar region   . Sleep apnea    "don't use CPAP" (04/15/2013)    Past Surgical History:  Procedure Laterality Date  . ANTERIOR CERVICAL DECOMP/DISCECTOMY FUSION  2000's X3   "had 3 surgeries on my front neck" (04/15/2013)  . ANTERIOR LAT LUMBAR FUSION Left 08/06/2017   Procedure: LEFT LUMBAR TWO-THREE ANTERIOR LATERAL LUMBAR FUSION WITH PERCUTANEOUS SCREWS;  Surgeon: Earnie Larsson, MD;  Location: [place];  Service: Neurosurgery;  Laterality: Left;  . APPENDECTOMY  ~ 2010  . BACK SURGERY    . CARPAL TUNNEL  RELEASE Left 1990's  . CATARACT EXTRACTION, BILATERAL    . COLONOSCOPY    . ear drum surgery    . EYE SURGERY Right   . FLEXIBLE SIGMOIDOSCOPY  12/21/2011   Procedure: FLEXIBLE SIGMOIDOSCOPY;  Surgeon: Lear Ng, MD;  Location: [facility];  Service: Endoscopy;  Laterality: N/A;  . HAND TENDON SURGERY Left ~ 1978 X2-1980's X3   "had 5 ORs after laceration"  . HOT HEMOSTASIS  12/21/2011   Procedure: HOT HEMOSTASIS (ARGON PLASMA COAGULATION/BICAP);  Surgeon: Lear Ng, MD;  Location: [facility];  Service: Endoscopy;  Laterality: N/A;  . INGUINAL HERNIA REPAIR Left 04/15/2013   Procedure: LAPAROSCOPIC REPAIR OF INCARCERATED LEFT INGUINAL HERNIA;  Surgeon: Gayland Curry, MD;  Location: [place];  Service: General;  Laterality: Left;  . INGUINAL HERNIA REPAIR Bilateral 1970's - 1984  . INGUINAL HERNIA REPAIR Left 04/15/2013  . INSERTION OF MESH Left 04/15/2013   Procedure: INSERTION OF MESH;  Surgeon: Gayland Curry, MD;  Location: [place];  Service: General;  Laterality: Left;  . JOINT REPLACEMENT     right knee  . KNEE ARTHROSCOPY Right 1990's  . LAPAROSCOPY N/A 04/15/2013   Procedure: LAPAROSCOPY DIAGNOSTIC;  Surgeon: Gayland Curry, MD;  Location: [place];  Service: General;  Laterality: N/A;  . LUMBAR LAMINECTOMY/DECOMPRESSION MICRODISCECTOMY Right 07/22/2015   Procedure: Right Lumbar Two-Three Microdiscectomy;  Surgeon: Earnie Larsson, MD;  Location: [place] [facility];  Service: Neurosurgery;  Laterality: Right;  . LUMBAR LAMINECTOMY/DECOMPRESSION MICRODISCECTOMY Right 09/05/2016   Procedure: Microdiscectomy - right - T12-L1;  Surgeon: Earnie Larsson, MD;  Location: [place];  Service: Neurosurgery;  Laterality: Right;  . NASAL SEPTUM SURGERY Bilateral 2014   "removed polyps & infection"  . POLYPECTOMY  01/10/2011   Procedure: POLYPECTOMY;  Surgeon: Lear Ng, MD;  Location: [facility];  Service: Endoscopy;  Laterality: N/A;  . POSTERIOR LUMBAR FUSION  11/1995   "ray cages"  (04/15/2013)  . SHOULDER ARTHROSCOPY Bilateral    "twice each"  . TONSILLECTOMY  1960's  . TOTAL KNEE ARTHROPLASTY Right 02/24/2014   Procedure: TOTAL RIGHT KNEE ARTHROPLASTY;  Surgeon: Mauri Pole, MD;  Location: [facility];  Service: Orthopedics;  Laterality: Right;  . TOTAL SHOULDER ARTHROPLASTY Left 09/18/2019   Procedure: TOTAL SHOULDER ARTHROPLASTY;  Surgeon: Justice Britain, MD;  Location: [facility];  Service: Orthopedics;  Laterality: Left;  175min  . UPPER GI ENDOSCOPY    . VASECTOMY  1988  . WISDOM TOOTH EXTRACTION  2000's    MEDICATIONS: . colchicine 0.6 MG tablet  . acetaminophen (TYLENOL) 500 MG tablet  . allopurinol (ZYLOPRIM) 300 MG tablet  . ALPRAZolam (XANAX) 1 MG tablet  . atenolol (TENORMIN) 100 MG tablet  . b complex vitamins tablet  . Biotin 5000 MCG CAPS  . Calcium Carbonate (CALCIUM 600 PO)  .  carboxymethylcellulose (REFRESH PLUS) 0.5 % SOLN  . diazepam (VALIUM) 5 MG tablet  . fish oil-omega-3 fatty acids 1000 MG capsule  . fluticasone (FLONASE) 50 MCG/ACT nasal spray  . gabapentin (NEURONTIN) 300 MG capsule  . Ginseng 100 MG CAPS  . guaiFENesin (MUCINEX) 600 MG 12 hr tablet  . hydrocortisone cream 1 %  . magnesium gluconate (MAGONATE) 500 MG tablet  . Melatonin 10 MG TABS  . Methylsulfonylmethane (MSM) 1000 MG CAPS  . Multiple Vitamins-Minerals (MULTIVITAMIN WITH MINERALS) tablet  . neomycin-bacitracin-polymyxin (NEOSPORIN) ointment  . NONFORMULARY OR COMPOUNDED ITEM  . olmesartan (BENICAR) 20 MG tablet  . omeprazole (PRILOSEC) 40 MG capsule  . Oxycodone HCl 10 MG TABS  . Psyllium (METAMUCIL FIBER PO)  . RAPAFLO 8 MG CAPS capsule  . selenium 200 MCG TABS tablet  . sodium chloride (OCEAN) 0.65 % SOLN nasal spray  . testosterone cypionate (DEPOTESTOTERONE CYPIONATE) 200 MG/ML injection  . Turmeric 500 MG CAPS  . venlafaxine (EFFEXOR-XR) 150 MG 24 hr capsule  . Vitamin D3 (VITAMIN D) 25 MCG tablet  . vitamin E 400 UNIT capsule   No current  facility-administered medications for this encounter.    Myra Gianotti, PA-C Surgical Short Stay/Anesthesiology [facility] Phone [phone] [facility] Phone [phone] 05/21/2020 12:21 PM

## 2020-05-24 ENCOUNTER — Other Ambulatory Visit: Payer: Self-pay

## 2020-05-24 ENCOUNTER — Encounter (HOSPITAL_COMMUNITY): Payer: Self-pay | Admitting: Neurosurgery

## 2020-05-24 ENCOUNTER — Encounter (HOSPITAL_COMMUNITY)
Admission: RE | Disposition: A | Discharge status: Home / Self Care | Payer: Self-pay | Source: Home / Self Care | Attending: Neurosurgery

## 2020-05-24 ENCOUNTER — Observation Stay (HOSPITAL_COMMUNITY)
Admission: RE | Admit: 2020-05-24 | Discharge: 2020-05-24 | Disposition: A | Discharge status: Home / Self Care | Payer: Medicare Other | Attending: Neurosurgery | Admitting: Neurosurgery

## 2020-05-24 ENCOUNTER — Ambulatory Visit (HOSPITAL_COMMUNITY): Payer: Medicare Other

## 2020-05-24 ENCOUNTER — Ambulatory Visit (HOSPITAL_COMMUNITY): Payer: Medicare Other | Admitting: Anesthesiology

## 2020-05-24 ENCOUNTER — Ambulatory Visit (HOSPITAL_COMMUNITY): Payer: Medicare Other | Admitting: Physician Assistant

## 2020-05-24 DIAGNOSIS — M5412 Radiculopathy, cervical region: Secondary | ICD-10-CM | POA: Diagnosis present

## 2020-05-24 DIAGNOSIS — Z419 Encounter for procedure for purposes other than remedying health state, unspecified: Secondary | ICD-10-CM

## 2020-05-24 DIAGNOSIS — Z96612 Presence of left artificial shoulder joint: Secondary | ICD-10-CM | POA: Insufficient documentation

## 2020-05-24 DIAGNOSIS — R7303 Prediabetes: Secondary | ICD-10-CM | POA: Diagnosis not present

## 2020-05-24 DIAGNOSIS — Z85820 Personal history of malignant melanoma of skin: Secondary | ICD-10-CM | POA: Diagnosis not present

## 2020-05-24 DIAGNOSIS — I10 Essential (primary) hypertension: Secondary | ICD-10-CM | POA: Insufficient documentation

## 2020-05-24 DIAGNOSIS — M7138 Other bursal cyst, other site: Secondary | ICD-10-CM | POA: Insufficient documentation

## 2020-05-24 DIAGNOSIS — M4722 Other spondylosis with radiculopathy, cervical region: Principal | ICD-10-CM | POA: Insufficient documentation

## 2020-05-24 DIAGNOSIS — Z87891 Personal history of nicotine dependence: Secondary | ICD-10-CM | POA: Diagnosis not present

## 2020-05-24 DIAGNOSIS — Z96651 Presence of right artificial knee joint: Secondary | ICD-10-CM | POA: Insufficient documentation

## 2020-05-24 DIAGNOSIS — M4802 Spinal stenosis, cervical region: Secondary | ICD-10-CM | POA: Insufficient documentation

## 2020-05-24 DIAGNOSIS — Z79899 Other long term (current) drug therapy: Secondary | ICD-10-CM | POA: Insufficient documentation

## 2020-05-24 HISTORY — PX: POSTERIOR CERVICAL LAMINECTOMY: SHX2248

## 2020-05-24 LAB — GLUCOSE, CAPILLARY: Glucose-Capillary: 108 mg/dL — ABNORMAL HIGH (ref 70–99)

## 2020-05-24 SURGERY — POSTERIOR CERVICAL LAMINECTOMY
Anesthesia: General | Laterality: Left

## 2020-05-24 MED ORDER — ONDANSETRON HCL 4 MG/2ML IJ SOLN: 4.0000 mg | Freq: Four times a day (QID) | INTRAMUSCULAR | Status: DC | PRN

## 2020-05-24 MED ORDER — BUPIVACAINE HCL (PF) 0.25 % IJ SOLN
INTRAMUSCULAR | Status: AC
  Filled 2020-05-24: qty 30

## 2020-05-24 MED ORDER — SUFENTANIL CITRATE 50 MCG/ML IV SOLN
INTRAVENOUS | Status: DC | PRN
  Administered 2020-05-24 (×2): 10 ug via INTRAVENOUS

## 2020-05-24 MED ORDER — CALCIUM CARBONATE 1250 (500 CA) MG PO TABS
1.0000 | ORAL_TABLET | Freq: Every day | ORAL | Status: DC
  Filled 2020-05-24: qty 1

## 2020-05-24 MED ORDER — ALLOPURINOL 300 MG PO TABS: 450.0000 mg | ORAL_TABLET | Freq: Every day | ORAL | Status: DC

## 2020-05-24 MED ORDER — PROPOFOL 10 MG/ML IV BOLUS
INTRAVENOUS | Status: AC
  Filled 2020-05-24: qty 20

## 2020-05-24 MED ORDER — PHENOL 1.4 % MT LIQD: 1.0000 | OROMUCOSAL | Status: DC | PRN

## 2020-05-24 MED ORDER — MIDAZOLAM HCL 2 MG/2ML IJ SOLN
INTRAMUSCULAR | Status: AC
  Filled 2020-05-24: qty 2

## 2020-05-24 MED ORDER — LACTATED RINGERS IV SOLN: INTRAVENOUS | Status: DC

## 2020-05-24 MED ORDER — THROMBIN 5000 UNITS EX SOLR
CUTANEOUS | Status: DC | PRN
  Administered 2020-05-24 (×2): 5000 [IU] via TOPICAL

## 2020-05-24 MED ORDER — CHLORHEXIDINE GLUCONATE CLOTH 2 % EX PADS: 6.0000 | MEDICATED_PAD | Freq: Once | CUTANEOUS | Status: DC

## 2020-05-24 MED ORDER — ACETAMINOPHEN 500 MG PO TABS
1000.0000 mg | ORAL_TABLET | Freq: Once | ORAL | Status: AC
  Administered 2020-05-24: 1000 mg via ORAL
  Filled 2020-05-24: qty 2

## 2020-05-24 MED ORDER — OMEGA-3 FATTY ACIDS 1000 MG PO CAPS: 1.0000 g | ORAL_CAPSULE | Freq: Every day | ORAL | Status: DC

## 2020-05-24 MED ORDER — MSM 1000 MG PO CAPS: 1000.0000 mg | ORAL_CAPSULE | Freq: Every day | ORAL | Status: DC

## 2020-05-24 MED ORDER — SODIUM CHLORIDE 0.9 % IV SOLN
250.0000 mL | INTRAVENOUS | Status: DC
  Administered 2020-05-24: 250 mL via INTRAVENOUS

## 2020-05-24 MED ORDER — B COMPLEX-C PO TABS: 1.0000 | ORAL_TABLET | Freq: Every day | ORAL | Status: DC

## 2020-05-24 MED ORDER — ALPRAZOLAM 0.5 MG PO TABS: 1.0000 mg | ORAL_TABLET | Freq: Every day | ORAL | Status: DC | PRN

## 2020-05-24 MED ORDER — ROCURONIUM 10MG/ML (10ML) SYRINGE FOR MEDFUSION PUMP - OPTIME
INTRAVENOUS | Status: DC | PRN
  Administered 2020-05-24: 100 mg via INTRAVENOUS

## 2020-05-24 MED ORDER — PSYLLIUM 95 % PO PACK: 1.0000 | PACK | Freq: Every day | ORAL | Status: DC

## 2020-05-24 MED ORDER — ROCURONIUM BROMIDE 10 MG/ML (PF) SYRINGE
PREFILLED_SYRINGE | INTRAVENOUS | Status: AC
  Filled 2020-05-24: qty 10

## 2020-05-24 MED ORDER — ACETAMINOPHEN 325 MG PO TABS: 650.0000 mg | ORAL_TABLET | ORAL | Status: DC | PRN

## 2020-05-24 MED ORDER — POLYVINYL ALCOHOL 1.4 % OP SOLN
1.0000 [drp] | OPHTHALMIC | Status: DC | PRN
  Filled 2020-05-24: qty 15

## 2020-05-24 MED ORDER — KETOROLAC TROMETHAMINE 30 MG/ML IJ SOLN
INTRAMUSCULAR | Status: DC | PRN
  Administered 2020-05-24: 30 mg via INTRAVENOUS

## 2020-05-24 MED ORDER — GABAPENTIN 300 MG PO CAPS: 300.0000 mg | ORAL_CAPSULE | Freq: Every day | ORAL | Status: DC

## 2020-05-24 MED ORDER — DIAZEPAM 5 MG PO TABS
5.0000 mg | ORAL_TABLET | Freq: Three times a day (TID) | ORAL | Status: DC | PRN
  Administered 2020-05-24: 5 mg via ORAL
  Filled 2020-05-24: qty 1

## 2020-05-24 MED ORDER — CYCLOBENZAPRINE HCL 10 MG PO TABS: 10.0000 mg | ORAL_TABLET | Freq: Three times a day (TID) | ORAL | 0 refills | Status: DC | PRN

## 2020-05-24 MED ORDER — HYDROCODONE-ACETAMINOPHEN 5-325 MG PO TABS
1.0000 | ORAL_TABLET | ORAL | Status: DC | PRN
Start: 2020-05-24 — End: 2020-05-24

## 2020-05-24 MED ORDER — ATENOLOL 100 MG PO TABS: 100.0000 mg | ORAL_TABLET | Freq: Every morning | ORAL | Status: DC

## 2020-05-24 MED ORDER — ORAL CARE MOUTH RINSE: 15.0000 mL | Freq: Once | OROMUCOSAL | Status: AC

## 2020-05-24 MED ORDER — SUFENTANIL CITRATE 50 MCG/ML IV SOLN
INTRAVENOUS | Status: AC
  Filled 2020-05-24: qty 1

## 2020-05-24 MED ORDER — SODIUM CHLORIDE (PF) 0.9 % IJ SOLN
INTRAMUSCULAR | Status: AC
  Filled 2020-05-24: qty 10

## 2020-05-24 MED ORDER — CYCLOBENZAPRINE HCL 10 MG PO TABS
ORAL_TABLET | ORAL | Status: AC
  Administered 2020-05-24: 10 mg via ORAL
  Filled 2020-05-24: qty 1

## 2020-05-24 MED ORDER — 0.9 % SODIUM CHLORIDE (POUR BTL) OPTIME
TOPICAL | Status: DC | PRN
  Administered 2020-05-24: 1000 mL

## 2020-05-24 MED ORDER — EPHEDRINE 5 MG/ML INJ
INTRAVENOUS | Status: AC
  Filled 2020-05-24: qty 10

## 2020-05-24 MED ORDER — PROMETHAZINE HCL 25 MG/ML IJ SOLN: 6.2500 mg | INTRAMUSCULAR | Status: DC | PRN

## 2020-05-24 MED ORDER — LIDOCAINE 2% (20 MG/ML) 5 ML SYRINGE
INTRAMUSCULAR | Status: AC
  Filled 2020-05-24: qty 5

## 2020-05-24 MED ORDER — BUPIVACAINE HCL (PF) 0.25 % IJ SOLN
INTRAMUSCULAR | Status: DC | PRN
  Administered 2020-05-24: 20 mL

## 2020-05-24 MED ORDER — B COMPLEX PO TABS: 1.0000 | ORAL_TABLET | Freq: Every day | ORAL | Status: DC

## 2020-05-24 MED ORDER — VITAMIN E 45 MG (100 UNIT) PO CAPS: 400.0000 [IU] | ORAL_CAPSULE | Freq: Every day | ORAL | Status: DC

## 2020-05-24 MED ORDER — THROMBIN 5000 UNITS EX SOLR
CUTANEOUS | Status: AC
  Filled 2020-05-24: qty 5000

## 2020-05-24 MED ORDER — KETAMINE HCL 50 MG/5ML IJ SOSY
PREFILLED_SYRINGE | INTRAMUSCULAR | Status: AC
  Filled 2020-05-24: qty 5

## 2020-05-24 MED ORDER — BACITRACIN ZINC 500 UNIT/GM EX OINT
TOPICAL_OINTMENT | CUTANEOUS | Status: DC | PRN
  Administered 2020-05-24: 1 via TOPICAL

## 2020-05-24 MED ORDER — IRBESARTAN 150 MG PO TABS
150.0000 mg | ORAL_TABLET | Freq: Every day | ORAL | Status: DC
  Administered 2020-05-24: 150 mg via ORAL
  Filled 2020-05-24: qty 1

## 2020-05-24 MED ORDER — MELATONIN 5 MG PO TABS: 10.0000 mg | ORAL_TABLET | Freq: Every evening | ORAL | Status: DC | PRN

## 2020-05-24 MED ORDER — TAMSULOSIN HCL 0.4 MG PO CAPS: 0.4000 mg | ORAL_CAPSULE | Freq: Every day | ORAL | Status: DC

## 2020-05-24 MED ORDER — HYDROMORPHONE HCL 1 MG/ML IJ SOLN
INTRAMUSCULAR | Status: AC
  Filled 2020-05-24: qty 1

## 2020-05-24 MED ORDER — ONDANSETRON HCL 4 MG PO TABS: 4.0000 mg | ORAL_TABLET | Freq: Four times a day (QID) | ORAL | Status: DC | PRN

## 2020-05-24 MED ORDER — BACITRACIN ZINC 500 UNIT/GM EX OINT
TOPICAL_OINTMENT | CUTANEOUS | Status: AC
  Filled 2020-05-24: qty 28.35

## 2020-05-24 MED ORDER — LIDOCAINE HCL (CARDIAC) PF 100 MG/5ML IV SOSY
PREFILLED_SYRINGE | INTRAVENOUS | Status: DC | PRN
  Administered 2020-05-24: 100 mg via INTRAVENOUS

## 2020-05-24 MED ORDER — CHLORHEXIDINE GLUCONATE 0.12 % MT SOLN
15.0000 mL | Freq: Once | OROMUCOSAL | Status: AC
  Administered 2020-05-24: 15 mL via OROMUCOSAL
  Filled 2020-05-24: qty 15

## 2020-05-24 MED ORDER — EPHEDRINE SULFATE 50 MG/ML IJ SOLN
INTRAMUSCULAR | Status: DC | PRN
  Administered 2020-05-24 (×2): 15 mg via INTRAVENOUS
  Administered 2020-05-24 (×2): 10 mg via INTRAVENOUS

## 2020-05-24 MED ORDER — HYDROMORPHONE HCL 1 MG/ML IJ SOLN: 1.0000 mg | INTRAMUSCULAR | Status: DC | PRN

## 2020-05-24 MED ORDER — ADULT MULTIVITAMIN W/MINERALS CH
1.0000 | ORAL_TABLET | Freq: Every day | ORAL | Status: DC
  Filled 2020-05-24: qty 1

## 2020-05-24 MED ORDER — SALINE SPRAY 0.65 % NA SOLN
1.0000 | Freq: Every day | NASAL | Status: DC
  Filled 2020-05-24: qty 44

## 2020-05-24 MED ORDER — CALCIUM CARBONATE 600 MG PO TABS: 600.0000 mg | ORAL_TABLET | Freq: Every day | ORAL | Status: DC

## 2020-05-24 MED ORDER — ACETAMINOPHEN 650 MG RE SUPP: 650.0000 mg | RECTAL | Status: DC | PRN

## 2020-05-24 MED ORDER — OXYCODONE HCL 5 MG PO TABS
10.0000 mg | ORAL_TABLET | ORAL | Status: DC | PRN
Start: 2020-05-24 — End: 2020-05-24
  Administered 2020-05-24: 10 mg via ORAL
  Filled 2020-05-24: qty 2

## 2020-05-24 MED ORDER — VITAMIN D3 25 MCG PO TABS: 1000.0000 [IU] | ORAL_TABLET | Freq: Every day | ORAL | Status: DC

## 2020-05-24 MED ORDER — PANTOPRAZOLE SODIUM 40 MG PO TBEC: 40.0000 mg | DELAYED_RELEASE_TABLET | Freq: Every day | ORAL | Status: DC

## 2020-05-24 MED ORDER — SODIUM CHLORIDE 0.9% FLUSH: 3.0000 mL | INTRAVENOUS | Status: DC | PRN

## 2020-05-24 MED ORDER — CEFAZOLIN SODIUM-DEXTROSE 2-4 GM/100ML-% IV SOLN
2.0000 g | INTRAVENOUS | Status: DC
  Filled 2020-05-24: qty 100

## 2020-05-24 MED ORDER — THROMBIN 5000 UNITS EX SOLR
OROMUCOSAL | Status: DC | PRN
  Administered 2020-05-24: 5 mL via TOPICAL

## 2020-05-24 MED ORDER — DEXAMETHASONE SODIUM PHOSPHATE 10 MG/ML IJ SOLN
10.0000 mg | Freq: Once | INTRAMUSCULAR | Status: AC
  Administered 2020-05-24: 10 mg via INTRAVENOUS
  Filled 2020-05-24: qty 1

## 2020-05-24 MED ORDER — CARBOXYMETHYLCELLULOSE SODIUM 0.5 % OP SOLN: 1.0000 [drp] | Freq: Every day | OPHTHALMIC | Status: DC | PRN

## 2020-05-24 MED ORDER — GLYCOPYRROLATE PF 0.2 MG/ML IJ SOSY
PREFILLED_SYRINGE | INTRAMUSCULAR | Status: AC
  Filled 2020-05-24: qty 1

## 2020-05-24 MED ORDER — ONDANSETRON HCL 4 MG/2ML IJ SOLN
INTRAMUSCULAR | Status: AC
  Filled 2020-05-24: qty 2

## 2020-05-24 MED ORDER — GUAIFENESIN ER 600 MG PO TB12
600.0000 mg | ORAL_TABLET | Freq: Every evening | ORAL | Status: DC | PRN
Start: 2020-05-24 — End: 2020-05-24
  Filled 2020-05-24: qty 1

## 2020-05-24 MED ORDER — OXYCODONE HCL 5 MG PO TABS
ORAL_TABLET | ORAL | Status: AC
  Administered 2020-05-24: 10 mg via ORAL
  Filled 2020-05-24: qty 2

## 2020-05-24 MED ORDER — MIDAZOLAM HCL 2 MG/2ML IJ SOLN
INTRAMUSCULAR | Status: DC | PRN
  Administered 2020-05-24: 2 mg via INTRAVENOUS

## 2020-05-24 MED ORDER — BIOTIN 5000 MCG PO CAPS: 5000.0000 ug | ORAL_CAPSULE | Freq: Every day | ORAL | Status: DC

## 2020-05-24 MED ORDER — ONDANSETRON HCL 4 MG/2ML IJ SOLN
INTRAMUSCULAR | Status: DC | PRN
  Administered 2020-05-24: 4 mg via INTRAVENOUS

## 2020-05-24 MED ORDER — PSYLLIUM 51.7 % PO PACK: PACK | Freq: Every day | ORAL | Status: DC

## 2020-05-24 MED ORDER — TURMERIC 500 MG PO CAPS: 500.0000 mg | ORAL_CAPSULE | Freq: Every day | ORAL | Status: DC

## 2020-05-24 MED ORDER — HEMOSTATIC AGENTS (NO CHARGE) OPTIME
TOPICAL | Status: DC | PRN
  Administered 2020-05-24: 1 via TOPICAL

## 2020-05-24 MED ORDER — ADULT MULTIVITAMIN W/MINERALS CH: 1.0000 | ORAL_TABLET | Freq: Every day | ORAL | Status: DC

## 2020-05-24 MED ORDER — OMEGA-3-ACID ETHYL ESTERS 1 G PO CAPS: 1.0000 g | ORAL_CAPSULE | Freq: Every day | ORAL | Status: DC

## 2020-05-24 MED ORDER — CEFAZOLIN SODIUM-DEXTROSE 1-4 GM/50ML-% IV SOLN
1.0000 g | Freq: Three times a day (TID) | INTRAVENOUS | Status: DC
  Administered 2020-05-24: 1 g via INTRAVENOUS
  Filled 2020-05-24: qty 50

## 2020-05-24 MED ORDER — VANCOMYCIN HCL 1000 MG IV SOLR
INTRAVENOUS | Status: AC
  Filled 2020-05-24: qty 1000

## 2020-05-24 MED ORDER — SELENIUM 200 MCG PO TABS: 200.0000 ug | ORAL_TABLET | Freq: Every day | ORAL | Status: DC

## 2020-05-24 MED ORDER — MAGNESIUM GLUCONATE 500 MG PO TABS
500.0000 mg | ORAL_TABLET | Freq: Every day | ORAL | Status: DC
  Filled 2020-05-24: qty 1

## 2020-05-24 MED ORDER — VENLAFAXINE HCL ER 150 MG PO CP24
150.0000 mg | ORAL_CAPSULE | Freq: Every day | ORAL | Status: DC
Start: 2020-05-25 — End: 2020-05-24
  Filled 2020-05-24: qty 1

## 2020-05-24 MED ORDER — KETOROLAC TROMETHAMINE 30 MG/ML IJ SOLN
INTRAMUSCULAR | Status: AC
  Filled 2020-05-24: qty 1

## 2020-05-24 MED ORDER — THROMBIN 5000 UNITS EX SOLR
CUTANEOUS | Status: AC
  Filled 2020-05-24: qty 10000

## 2020-05-24 MED ORDER — KETOROLAC TROMETHAMINE 15 MG/ML IJ SOLN: 30.0000 mg | Freq: Four times a day (QID) | INTRAMUSCULAR | Status: DC

## 2020-05-24 MED ORDER — CYCLOBENZAPRINE HCL 10 MG PO TABS: 10.0000 mg | ORAL_TABLET | Freq: Three times a day (TID) | ORAL | Status: DC | PRN

## 2020-05-24 MED ORDER — GINSENG 100 MG PO CAPS: 400.0000 mg | ORAL_CAPSULE | Freq: Every day | ORAL | Status: DC

## 2020-05-24 MED ORDER — LACTATED RINGERS IV SOLN: INTRAVENOUS | Status: DC | PRN

## 2020-05-24 MED ORDER — KETAMINE HCL 10 MG/ML IJ SOLN
INTRAMUSCULAR | Status: DC | PRN
  Administered 2020-05-24: 10 mg via INTRAVENOUS
  Administered 2020-05-24: 20 mg via INTRAVENOUS
  Administered 2020-05-24 (×2): 10 mg via INTRAVENOUS

## 2020-05-24 MED ORDER — PROPOFOL 10 MG/ML IV BOLUS
INTRAVENOUS | Status: DC | PRN
  Administered 2020-05-24: 200 mg via INTRAVENOUS

## 2020-05-24 MED ORDER — SODIUM CHLORIDE 0.9% FLUSH: 3.0000 mL | Freq: Two times a day (BID) | INTRAVENOUS | Status: DC

## 2020-05-24 MED ORDER — MENTHOL 3 MG MT LOZG: 1.0000 | LOZENGE | OROMUCOSAL | Status: DC | PRN

## 2020-05-24 MED ORDER — FLUTICASONE PROPIONATE 50 MCG/ACT NA SUSP
1.0000 | Freq: Two times a day (BID) | NASAL | Status: DC
  Filled 2020-05-24: qty 16

## 2020-05-24 MED ORDER — SUGAMMADEX SODIUM 200 MG/2ML IV SOLN
INTRAVENOUS | Status: DC | PRN
  Administered 2020-05-24: 200 mg via INTRAVENOUS

## 2020-05-24 MED ORDER — HYDROMORPHONE HCL 1 MG/ML IJ SOLN
0.2500 mg | INTRAMUSCULAR | Status: DC | PRN
  Administered 2020-05-24 (×2): 0.5 mg via INTRAVENOUS

## 2020-05-24 SURGICAL SUPPLY — 50 items
ADH SKN CLS APL DERMABOND .7 (GAUZE/BANDAGES/DRESSINGS) ×1
APL SKNCLS STERI-STRIP NONHPOA (GAUZE/BANDAGES/DRESSINGS) ×1
BAG DECANTER FOR FLEXI CONT (MISCELLANEOUS) ×2 IMPLANT
BAND INSRT 18 STRL LF DISP RB (MISCELLANEOUS) ×2
BAND RUBBER #18 3X1/16 STRL (MISCELLANEOUS) ×2 IMPLANT
BENZOIN TINCTURE PRP APPL 2/3 (GAUZE/BANDAGES/DRESSINGS) ×2 IMPLANT
BLADE CLIPPER SURG (BLADE) IMPLANT
BUR MATCHSTICK NEURO 3.0 LAGG (BURR) ×2 IMPLANT
CANISTER SUCT 3000ML PPV (MISCELLANEOUS) ×2 IMPLANT
CARTRIDGE OIL MAESTRO DRILL (MISCELLANEOUS) ×1 IMPLANT
COVER WAND RF STERILE (DRAPES) ×2 IMPLANT
DERMABOND ADVANCED (GAUZE/BANDAGES/DRESSINGS) ×1
DERMABOND ADVANCED .7 DNX12 (GAUZE/BANDAGES/DRESSINGS) ×1 IMPLANT
DIFFUSER DRILL AIR PNEUMATIC (MISCELLANEOUS) ×2 IMPLANT
DRAPE LAPAROTOMY 100X72 PEDS (DRAPES) ×2 IMPLANT
DRAPE MICROSCOPE LEICA (MISCELLANEOUS) ×1 IMPLANT
DRSG OPSITE POSTOP 4X6 (GAUZE/BANDAGES/DRESSINGS) ×1 IMPLANT
DURAPREP 26ML APPLICATOR (WOUND CARE) ×2 IMPLANT
ELECT REM PT RETURN 9FT ADLT (ELECTROSURGICAL) ×2
ELECTRODE REM PT RTRN 9FT ADLT (ELECTROSURGICAL) ×1 IMPLANT
GAUZE 4X4 16PLY RFD (DISPOSABLE) IMPLANT
GAUZE SPONGE 4X4 12PLY STRL (GAUZE/BANDAGES/DRESSINGS) ×2 IMPLANT
GLOVE ECLIPSE 9.0 STRL (GLOVE) ×3 IMPLANT
GLOVE EXAM NITRILE XL STR (GLOVE) IMPLANT
GOWN STRL REUS W/ TWL LRG LVL3 (GOWN DISPOSABLE) IMPLANT
GOWN STRL REUS W/ TWL XL LVL3 (GOWN DISPOSABLE) ×1 IMPLANT
GOWN STRL REUS W/TWL 2XL LVL3 (GOWN DISPOSABLE) ×1 IMPLANT
GOWN STRL REUS W/TWL LRG LVL3 (GOWN DISPOSABLE) ×2
GOWN STRL REUS W/TWL XL LVL3 (GOWN DISPOSABLE) ×2
HEMOSTAT POWDER SURGIFOAM 1G (HEMOSTASIS) ×1 IMPLANT
KIT BASIN OR (CUSTOM PROCEDURE TRAY) ×2 IMPLANT
KIT TURNOVER KIT B (KITS) ×2 IMPLANT
NDL SPNL 22GX3.5 QUINCKE BK (NEEDLE) ×1 IMPLANT
NEEDLE HYPO 22GX1.5 SAFETY (NEEDLE) ×2 IMPLANT
NEEDLE SPNL 22GX3.5 QUINCKE BK (NEEDLE) ×2 IMPLANT
NS IRRIG 1000ML POUR BTL (IV SOLUTION) ×2 IMPLANT
OIL CARTRIDGE MAESTRO DRILL (MISCELLANEOUS) ×2
PACK LAMINECTOMY NEURO (CUSTOM PROCEDURE TRAY) ×2 IMPLANT
PAD ARMBOARD 7.5X6 YLW CONV (MISCELLANEOUS) ×3 IMPLANT
PIN MAYFIELD SKULL DISP (PIN) ×2 IMPLANT
SPONGE LAP 4X18 RFD (DISPOSABLE) IMPLANT
SPONGE SURGIFOAM ABS GEL SZ50 (HEMOSTASIS) ×2 IMPLANT
STRIP CLOSURE SKIN 1/2X4 (GAUZE/BANDAGES/DRESSINGS) ×2 IMPLANT
SUT VIC AB 0 CT1 18XCR BRD8 (SUTURE) ×1 IMPLANT
SUT VIC AB 0 CT1 8-18 (SUTURE) ×2
SUT VIC AB 2-0 CT1 18 (SUTURE) ×2 IMPLANT
SUT VIC AB 3-0 SH 8-18 (SUTURE) ×2 IMPLANT
TOWEL GREEN STERILE (TOWEL DISPOSABLE) ×2 IMPLANT
TOWEL GREEN STERILE FF (TOWEL DISPOSABLE) ×2 IMPLANT
WATER STERILE IRR 1000ML POUR (IV SOLUTION) ×2 IMPLANT

## 2020-05-24 NOTE — Evaluation (Signed)
Occupational Therapy Evaluation Patient Details Name: Victor [name] MRN: [id number] DOB: 10-09-1955 Today's Date: 05/24/2020    History of Present Illness Pt is a 65 yo male s/p underwent left-sided C2-C3 laminotomy and resection of synovial cyst. PMHx: chronic nack pain, anxiety, back sxs and cervical sxs.   Clinical Impression   Pt PTA: Pt living with spouse and reports multiple back and neck surgeries in the past. Pt currently, no pain reported, ice had been applied to neck. Pt currently, modified independence for ADL and supervisionA for mobility and transferring in/out of shower.  Neck handout provided and reviewed ADL in detail. Pt educated on: setting an alarm at night for medication, avoid sitting for long periods of time, correct bed positioning for sleeping, correct sequence for bed mobility, avoiding lifting more than 5 pounds and never wash directly over incision. All education is complete and patient indicates understanding. Pt does not require continued OT skilled services. OT signing off thank you.      Follow Up Recommendations  No OT follow up    Equipment Recommendations  None recommended by OT    Recommendations for Other Services       Precautions / Restrictions Precautions Precautions: Cervical Precaution Booklet Issued: Yes (comment) Precaution Comments: pt with assist was able to name 3/3 precautions Restrictions Weight Bearing Restrictions: No      Mobility Bed Mobility Overal bed mobility: Needs Assistance Bed Mobility: Sidelying to Sit   Sidelying to sit: Modified independent (Device/Increase time)       General bed mobility comments: no physical assist    Transfers Overall transfer level: Modified independent Equipment used: None                  Balance Overall balance assessment: Modified Independent                                         ADL either performed or assessed with clinical judgement   ADL  Overall ADL's : Modified independent                                       General ADL Comments: Pt performing figure 4 technique in bed for LB ADL. Pt's spouse in room for Jet and education. Pt reports that pt's spouse is retired and she will be able to assist. Pt aware of precautions and plans to use pillows/wedge pillow for elevation of head for sleeping as advised by MD.     Vision Baseline Vision/History: No visual deficits Vision Assessment?: No apparent visual deficits     Perception     Praxis      Pertinent Vitals/Pain Pain Assessment: No/denies pain     Hand Dominance Right   Extremity/Trunk Assessment Upper Extremity Assessment Upper Extremity Assessment: Overall WFL for tasks assessed   Lower Extremity Assessment Lower Extremity Assessment: Overall WFL for tasks assessed   Cervical / Trunk Assessment Cervical / Trunk Assessment: Other exceptions Cervical / Trunk Exceptions: s/p cervical sx   Communication Communication Communication: No difficulties   Cognition Arousal/Alertness: Awake/alert Behavior During Therapy: WFL for tasks assessed/performed Overall Cognitive Status: Within Functional Limits for tasks assessed  General Comments  VSS. Education complete.    Exercises     Shoulder Instructions      Home Living Family/patient expects to be discharged to:: Private residence Living Arrangements: Spouse/significant other Available Help at Discharge: Available 24 hours/day Type of Home: [place]: One level     Bathroom Shower/Tub: Teacher, early years/pre: Standard     Home Equipment: Environmental consultant - 2 wheels;Cane - single point          Prior Functioning/Environment Level of Independence: Independent with assistive device(s)        Comments: RW or SPC for mobility        OT Problem List: Pain      OT Treatment/Interventions:       OT Goals(Current goals can be found in the care plan section) Acute Rehab OT Goals Patient Stated Goal: to go home OT Goal Formulation: All assessment and education complete, DC therapy  OT Frequency:     Barriers to D/C:            Co-evaluation              AM-PAC OT "6 Clicks" Daily Activity     Outcome Measure Help from another person eating meals?: None Help from another person taking care of personal grooming?: None Help from another person toileting, which includes using toliet, bedpan, or urinal?: None Help from another person bathing (including washing, rinsing, drying)?: A [place] Help from another person to put on and taking off regular upper body clothing?: None Help from another person to put on and taking off regular lower body clothing?: None 6 Click Score: 23   End of Session Nurse Communication: Mobility status;Other (comment) (numbness at neck and around L ear- RN reports that it is from anasthesia.)  Activity Tolerance: Patient tolerated treatment well Patient left: in bed;with call bell/phone within reach  OT Visit Diagnosis: Unsteadiness on feet (R26.81)                Time: 0102-7253 OT Time Calculation (min): 30 min Charges:  OT General Charges $OT Visit: 1 Visit OT Evaluation $OT Eval Moderate Complexity: 1 Mod OT Treatments $Self Care/Home Management : 8-22 mins  Jefferey Pica, OTR/L Acute Rehabilitation Services Pager: [phone] Office: [phone]   Graciano Batson C 05/24/2020, 5:27 PM

## 2020-05-24 NOTE — Discharge Instructions (Signed)
Wound Care Keep incision covered and dry for two days.   Do not put any creams, lotions, or ointments on incision. Leave steri-strips on back of neck.  They will fall off by themselves. Activity Walk each and every day, increasing distance each day. No lifting greater than 5 lbs.  Avoid excessive neck motion. No driving for 2 weeks; may ride as a passenger locally.  Diet Resume your normal diet.  Call Your Doctor If Any of These Occur Redness, drainage, or swelling at the wound.  Temperature greater than 101 degrees. Severe pain not relieved by pain medication. Incision starts to come apart. Follow Up Appt Call today for appointment in 1-2 weeks ([phone]) or for problems.  If you have any hardware placed in your spine, you will need an x-ray before your appointment.

## 2020-05-24 NOTE — Brief Op Note (Signed)
05/24/2020  11:31 AM  PATIENT:  Eames Dibiasio Menendez  65 y.o. male  PRE-OPERATIVE DIAGNOSIS:  Stenosis  POST-OPERATIVE DIAGNOSIS:  Stenosis  PROCEDURE:  Procedure(s): Laminectomy and Foraminotomy - left - Cervical two-Cervical three (Left)  SURGEON:  Surgeon(s) and Role:    * Earnie Larsson, MD - Primary    Kristeen Miss, MD - Assisting  PHYSICIAN ASSISTANT:   ASSISTANTS:    ANESTHESIA:   general  EBL:  100 mL   BLOOD ADMINISTERED:none  DRAINS: none   LOCAL MEDICATIONS USED:  MARCAINE     SPECIMEN:  No Specimen  DISPOSITION OF SPECIMEN:  N/A  COUNTS:  YES  TOURNIQUET:  * No tourniquets in log *  DICTATION: .Dragon Dictation  PLAN OF CARE: Admit for overnight observation  PATIENT DISPOSITION:  PACU - hemodynamically stable.   Delay start of Pharmacological VTE agent (>24hrs) due to surgical blood loss or risk of bleeding: yes

## 2020-05-24 NOTE — H&P (Signed)
Victor [name] is an 65 y.o. male.   Chief Complaint: Neck pain HPI: 65 year old male with prior C3-C7 anterior cervical fusion presents with increasing neck pain with some associated headaches.  Work-up demonstrates evidence of severe spondylitic narrowing on the left at C2-3 with marked compression of the exiting left C3 nerve root.  Patient has failed conservative management including injections.  He presents now for left-sided C2-3 laminotomy and foraminotomy in hopes of improving his symptoms.  Past Medical History:  Diagnosis Date  . Anxiety   . Arthritis    "all over" (04/15/2013)  . Back pain, chronic   . Chronic neck pain   . DDD (degenerative disc disease), cervical   . DDD (degenerative disc disease), lumbar   . Fibromyalgia   . GERD (gastroesophageal reflux disease)   . Gout   . Headache   . High cholesterol   . History of bronchitis   . Hypertension   . Leg cramps    Right greater than left  . Melanoma ([place])    Nose  . MRSA infection   . Neuropathy of both feet   . Peripheral vascular disease ([place])   . Pre-diabetes   . Radiculopathy of lumbar region   . Sleep apnea    "don't use CPAP" (04/15/2013)    Past Surgical History:  Procedure Laterality Date  . ANTERIOR CERVICAL DECOMP/DISCECTOMY FUSION  2000's X3   "had 3 surgeries on my front neck" (04/15/2013)  . ANTERIOR LAT LUMBAR FUSION Left 08/06/2017   Procedure: LEFT LUMBAR TWO-THREE ANTERIOR LATERAL LUMBAR FUSION WITH PERCUTANEOUS SCREWS;  Surgeon: Earnie Larsson, MD;  Location: [place];  Service: Neurosurgery;  Laterality: Left;  . APPENDECTOMY  ~ 2010  . BACK SURGERY    . CARPAL TUNNEL RELEASE Left 1990's  . CATARACT EXTRACTION, BILATERAL    . COLONOSCOPY    . ear drum surgery    . EYE SURGERY Right   . FLEXIBLE SIGMOIDOSCOPY  12/21/2011   Procedure: FLEXIBLE SIGMOIDOSCOPY;  Surgeon: Lear Ng, MD;  Location: [facility];  Service: Endoscopy;  Laterality: N/A;  . HAND TENDON SURGERY Left ~  1978 X2-1980's X3   "had 5 ORs after laceration"  . HOT HEMOSTASIS  12/21/2011   Procedure: HOT HEMOSTASIS (ARGON PLASMA COAGULATION/BICAP);  Surgeon: Lear Ng, MD;  Location: [facility];  Service: Endoscopy;  Laterality: N/A;  . INGUINAL HERNIA REPAIR Left 04/15/2013   Procedure: LAPAROSCOPIC REPAIR OF INCARCERATED LEFT INGUINAL HERNIA;  Surgeon: Gayland Curry, MD;  Location: [place];  Service: General;  Laterality: Left;  . INGUINAL HERNIA REPAIR Bilateral 1970's - 1984  . INGUINAL HERNIA REPAIR Left 04/15/2013  . INSERTION OF MESH Left 04/15/2013   Procedure: INSERTION OF MESH;  Surgeon: Gayland Curry, MD;  Location: [place];  Service: General;  Laterality: Left;  . JOINT REPLACEMENT     right knee  . KNEE ARTHROSCOPY Right 1990's  . LAPAROSCOPY N/A 04/15/2013   Procedure: LAPAROSCOPY DIAGNOSTIC;  Surgeon: Gayland Curry, MD;  Location: [place];  Service: General;  Laterality: N/A;  . LUMBAR LAMINECTOMY/DECOMPRESSION MICRODISCECTOMY Right 07/22/2015   Procedure: Right Lumbar Two-Three Microdiscectomy;  Surgeon: Earnie Larsson, MD;  Location: [facility];  Service: Neurosurgery;  Laterality: Right;  . LUMBAR LAMINECTOMY/DECOMPRESSION MICRODISCECTOMY Right 09/05/2016   Procedure: Microdiscectomy - right - T12-L1;  Surgeon: Earnie Larsson, MD;  Location: [place];  Service: Neurosurgery;  Laterality: Right;  . NASAL SEPTUM SURGERY Bilateral 2014   "removed polyps & infection"  .  POLYPECTOMY  01/10/2011   Procedure: POLYPECTOMY;  Surgeon: Lear Ng, MD;  Location: [facility];  Service: Endoscopy;  Laterality: N/A;  . POSTERIOR LUMBAR FUSION  11/1995   "ray cages" (04/15/2013)  . SHOULDER ARTHROSCOPY Bilateral    "twice each"  . TONSILLECTOMY  1960's  . TOTAL KNEE ARTHROPLASTY Right 02/24/2014   Procedure: TOTAL RIGHT KNEE ARTHROPLASTY;  Surgeon: Mauri Pole, MD;  Location: [facility];  Service: Orthopedics;  Laterality: Right;  . TOTAL SHOULDER ARTHROPLASTY Left 09/18/2019   Procedure:  TOTAL SHOULDER ARTHROPLASTY;  Surgeon: Justice Britain, MD;  Location: [facility];  Service: Orthopedics;  Laterality: Left;  169min  . UPPER GI ENDOSCOPY    . VASECTOMY  1988  . WISDOM TOOTH EXTRACTION  2000's    Family History  Problem Relation Age of Onset  . Hypertension Father   . Cancer Mother        breast   . Heart Problems Mother   . Alzheimer's disease Mother   . Allergies Daughter   . Interstitial cystitis Daughter    Social History:  reports that he quit smoking about 10 years ago. His smoking use included cigarettes and cigars. He has a 1.50 pack-year smoking history. He has never used smokeless tobacco. He reports current alcohol use of about 5.0 standard drinks of alcohol per week. He reports that he does not use drugs.  Allergies:  Allergies  Allergen Reactions  . Tetracyclines & Related Other (See Comments)    " makes me angry"  . Niacin And Related Itching    Medications Prior to Admission  Medication Sig Dispense Refill  . acetaminophen (TYLENOL) 500 MG tablet Take 1,000 mg by mouth every 8 (eight) hours as needed for moderate pain.     Marland Kitchen allopurinol (ZYLOPRIM) 300 MG tablet TAKE 1 AND 1/2 TABLETS BY MOUTH DAILY (Patient taking differently: Take 450 mg by mouth daily.) 135 tablet 0  . ALPRAZolam (XANAX) 1 MG tablet Take 1 mg by mouth daily as needed for anxiety.    Marland Kitchen atenolol (TENORMIN) 100 MG tablet Take 100 mg by mouth every morning.    Marland Kitchen b complex vitamins tablet Take 1 tablet by mouth daily. B150    . Biotin 5000 MCG CAPS Take 5,000 mcg by mouth daily.    . Calcium Carbonate (CALCIUM 600 PO) Take 600 mg by mouth daily.    . carboxymethylcellulose (REFRESH PLUS) 0.5 % SOLN Place 1 drop into both eyes daily as needed (Dry eyes).    . colchicine 0.6 MG tablet Take 1 tablet (0.6 mg total) by mouth 2 (two) times daily as needed. 180 tablet 0  . diazepam (VALIUM) 5 MG tablet Take 1 tablet (5 mg total) by mouth every 8 (eight) hours as needed for muscle spasms. 50  tablet 0  . fish oil-omega-3 fatty acids 1000 MG capsule Take 1 g by mouth daily.    . fluticasone (FLONASE) 50 MCG/ACT nasal spray Place 1 spray into both nostrils 2 (two) times daily. For congestion    . gabapentin (NEURONTIN) 300 MG capsule TAKE 2 CAPSULES BY MOUTH 3 TIMES A DAY (Patient taking differently: Take 300 mg by mouth daily.) 540 capsule 0  . Ginseng 100 MG CAPS Take 400 mg by mouth daily.    Marland Kitchen guaiFENesin (MUCINEX) 600 MG 12 hr tablet Take 600 mg by mouth at bedtime as needed (Congestion).    . magnesium gluconate (MAGONATE) 500 MG tablet Take 500 mg by mouth daily.    Marland Kitchen  Melatonin 10 MG TABS Take 10 mg by mouth at bedtime as needed (sleep).    . Methylsulfonylmethane (MSM) 1000 MG CAPS Take 1,000 mg by mouth daily.    . Multiple Vitamins-Minerals (MULTIVITAMIN WITH MINERALS) tablet Take 1 tablet by mouth daily.    . NONFORMULARY OR COMPOUNDED ITEM Apply 1 g topically every evening. Doxepin 5%, Amantadine 3%, Pentoxifylline 3%, Lamotrigine 2%, Sertraline 3%, Gabapentin 6% PCCA Lipoderm Base Cream    . olmesartan (BENICAR) 20 MG tablet Take 20 mg by mouth daily.    Marland Kitchen omeprazole (PRILOSEC) 40 MG capsule Take 40 mg by mouth daily.    . Oxycodone HCl 10 MG TABS Take 10-20 mg by mouth daily as needed (pain).    . Psyllium (METAMUCIL FIBER PO) Take 1 Scoop by mouth daily.     Marland Kitchen RAPAFLO 8 MG CAPS capsule Take 8 mg by mouth at bedtime.  3  . selenium 200 MCG TABS tablet Take 200 mcg by mouth daily.    . sodium chloride (OCEAN) 0.65 % SOLN nasal spray Place 1 spray into both nostrils at bedtime.    Marland Kitchen testosterone cypionate (DEPOTESTOTERONE CYPIONATE) 200 MG/ML injection Inject 200 mg into the muscle every 14 (fourteen) days. 0.75 mL    . Turmeric 500 MG CAPS Take 500 mg by mouth daily.    Marland Kitchen venlafaxine (EFFEXOR-XR) 150 MG 24 hr capsule Take 150 mg by mouth daily with breakfast.    . Vitamin D3 (VITAMIN D) 25 MCG tablet Take 1,000 Units by mouth daily.    . vitamin E 400 UNIT capsule Take  400 Units by mouth daily.    . hydrocortisone cream 1 % Apply 1 application topically daily as needed for itching.    . neomycin-bacitracin-polymyxin (NEOSPORIN) ointment Apply 1 application topically daily as needed for wound care.      Results for orders placed or performed during the hospital encounter of 05/24/20 (from the past 48 hour(s))  Glucose, capillary     Status: Abnormal   Collection Time: 05/24/20  8:24 AM  Result Value Ref Range   Glucose-Capillary 108 (H) 70 - 99 mg/dL    Comment: Glucose reference range applies only to samples taken after fasting for at least 8 hours.   Comment 1 Notify RN    No results found.  Pertinent items noted in HPI and remainder of comprehensive ROS otherwise negative.  Blood pressure 131/89, pulse (!) 58, temperature 97.6 F (36.4 C), temperature source Oral, resp. rate 18, height 5\' 7"  (1.702 m), weight 100.7 kg, SpO2 99 %.  Patient is awake and alert.  He is oriented and appropriate.  Speech is fluent.  Judgment insight are intact.  Cranial nerve function intact bilateral.  Motor examination intact bilateral upper and lower extremities.  Sensory examination nonfocal.  Deep tender versus normal active.  No evidence of long track signs.  Gait antalgic.  Posture mildly flexed peer examination head ears eyes nose throat is unremarkable chest and abdomen are benign.  Extremities are free of major deformity. 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  Plan left C2-C3 laminotomy and foraminotomies.  Risks and benefits been explained.  Patient wishes to proceed.  Cooper Render Dimitra [place]stock 05/24/2020, 9:42 AM

## 2020-05-24 NOTE — Plan of Care (Signed)
Patient alert and oriented, voiding adequately, MAE well with no difficulty. Incision area cdi with no s/s of infection. Patient discharged home per order. Patient and spouse stated understanding of discharge instructions given. Patient has an appointment with Dr. Annette Stable in 2 weeks

## 2020-05-24 NOTE — Op Note (Signed)
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  Asst. Surgeon: Ellene Route  Anesthesia: General  Indication: 65 year old male status post prior C3-C7 anterior cervical decompression and fusion.  Patient presents with worsening neck pain and associated headaches consistent with a C3 radiculopathy.  Work-up demonstrates evidence of marked foraminal stenosis on the left at C2-3 with compression of the exiting C3 nerve root.  Patient presents now for simple decompressive surgery in hopes of improving her symptoms.  Operative note: After induction of anesthesia, patient position prone onto bolsters with his head fixed in [place] pin headrest.  Patient's posterior cervical region prepped and draped sterilely.  Incision made overlying C2-3.  Dissection performed on the left.  Retractor placed.  X-ray taken.  Level confirmed.  Laminotomy performed using high-speed drill and Kerrison rongeurs to remove the inferior aspect of the lamina of C2, the superior aspect of the lamina of C3, and the medial aspect of the C2-C3 facet joint.  When flavum elevated and resected.  Microscope brought in field is microdissection of the spinal canal.  There was a densely adherent synovial cyst emanating from the facet joint and stuck to the left-sided C3 nerve root.  Using the microscope for microdissection the synovial cyst was dissected free and completely resected.  Foraminotomies then completed on the course exiting C3 nerve root.  There was no evidence of injury to the thecal sac or nerve roots.  The wound was then irrigated with antibiotic solution.  Gelfoam was placed topically for hemostasis.  Wounds and  closed in layers with Vicryl sutures.  Steri-Strips and sterile dressing were applied.  No apparent complications.  Patient tolerated the procedure well and he returns to the recovery room postop.

## 2020-05-24 NOTE — Transfer of Care (Signed)
Immediate Anesthesia Transfer of Care Note  Patient: Victor [name]  Procedure(s) Performed: Laminectomy and Foraminotomy - left - Cervical two-Cervical three (Left )  Patient Location: PACU  Anesthesia Type:General  Level of Consciousness: awake, alert , oriented and patient cooperative  Airway & Oxygen Therapy: Patient Spontanous Breathing and Patient connected to nasal cannula oxygen  Post-op Assessment: Report given to RN, Post -op Vital signs reviewed and stable and Patient moving all extremities X 4  Post vital signs: Reviewed and stable  Last Vitals:  Vitals Value Taken Time  BP 142/92 05/24/20 1144  Temp    Pulse 63 05/24/20 1144  Resp 5 05/24/20 1142  SpO2 100 % 05/24/20 1144  Vitals shown include unvalidated device data.  Last Pain:  Vitals:   05/24/20 0858  TempSrc:   PainSc: 6       Patients Stated Pain Goal: 4 (47/09/62 8366)  Complications: No complications documented.

## 2020-05-24 NOTE — Anesthesia Postprocedure Evaluation (Signed)
Anesthesia Post Note  Patient: Victor [name]  Procedure(s) Performed: Laminectomy and Foraminotomy - left - Cervical two-Cervical three (Left )     Patient location during evaluation: PACU Anesthesia Type: General Level of consciousness: awake and alert and oriented Pain management: pain level controlled Vital Signs Assessment: post-procedure vital signs reviewed and stable Respiratory status: spontaneous breathing, nonlabored ventilation and respiratory function stable Cardiovascular status: blood pressure returned to baseline Postop Assessment: no apparent nausea or vomiting Anesthetic complications: no   No complications documented.  Last Vitals:  Vitals:   05/24/20 1234 05/24/20 1250  BP: (!) 145/103 (!) 137/91  Pulse: (!) 56 (!) 50  Resp: (!) 22 (!) 7  Temp:  (!) 36.2 C  SpO2: 100% 100%    Last Pain:  Vitals:   05/24/20 1250  TempSrc:   PainSc: [place]

## 2020-05-24 NOTE — Anesthesia Procedure Notes (Signed)
Procedure Name: Intubation Date/Time: 05/24/2020 10:05 AM Performed by: Claris Che, CRNA Pre-anesthesia Checklist: Patient identified, Emergency Drugs available, Suction available, Patient being monitored and Timeout performed Patient Re-evaluated:Patient Re-evaluated prior to induction Oxygen Delivery Method: Circle system utilized Preoxygenation: Pre-oxygenation with 100% oxygen Induction Type: IV induction and Cricoid Pressure applied Ventilation: Mask ventilation without difficulty Laryngoscope Size: Mac and 4 Grade View: Grade II Tube type: Oral Tube size: 8.0 mm Number of attempts: 1 Airway Equipment and Method: Stylet Placement Confirmation: ETT inserted through vocal cords under direct vision,  positive ETCO2 and breath sounds checked- equal and bilateral Secured at: 23 cm Tube secured with: Tape Dental Injury: Teeth and Oropharynx as per pre-operative assessment

## 2020-05-24 NOTE — Discharge Summary (Signed)
Physician Discharge Summary  Patient ID: Victor [name] MRN: [id number] DOB/AGE: May 26, 1955 65 y.o.  Admit date: 05/24/2020 Discharge date: 05/24/2020  Admission Diagnoses:  Discharge Diagnoses:  Active Problems:   Cervical radiculopathy   Discharged Condition: good  Hospital Course: Patient mated to the hospital where underwent left-sided C2-C3 laminotomy and resection of synovial cyst.  Postop Victor [name] doing very well.  Preoperative severe radicular pain relief.  Standing walking and voiding without difficulty.  Ready for discharge home.  Consults:   Significant Diagnostic Studies:   Treatments:   Discharge Exam: Blood pressure (!) 147/90, pulse (!) 54, temperature 97.9 F (36.6 C), resp. rate 20, height 5\' 7"  (1.702 m), weight 100.7 kg, SpO2 100 %. Patient is awake and alert.  He is oriented and appropriate.  Motor and sensory function intact.  Wound clean and dry.  Chest and abdomen benign.  Disposition: Discharge disposition: 01-Home or Self Care        Allergies as of 05/24/2020      Reactions   Tetracyclines & Related Other (See Comments)   " makes me angry"   Niacin And Related Itching      Medication List    TAKE these medications   acetaminophen 500 MG tablet Commonly known as: TYLENOL Take 1,000 mg by mouth every 8 (eight) hours as needed for moderate pain.   allopurinol 300 MG tablet Commonly known as: ZYLOPRIM TAKE 1 AND 1/2 TABLETS BY MOUTH DAILY   ALPRAZolam 1 MG tablet Commonly known as: XANAX Take 1 mg by mouth daily as needed for anxiety.   atenolol 100 MG tablet Commonly known as: TENORMIN Take 100 mg by mouth every morning.   b complex vitamins tablet Take 1 tablet by mouth daily. B150   Biotin 5000 MCG Caps Take 5,000 mcg by mouth daily.   CALCIUM 600 PO Take 600 mg by mouth daily.   carboxymethylcellulose 0.5 % Soln Commonly known as: REFRESH PLUS Place 1 drop into both eyes daily as needed (Dry eyes).   colchicine 0.6  MG tablet Take 1 tablet (0.6 mg total) by mouth 2 (two) times daily as needed.   cyclobenzaprine 10 MG tablet Commonly known as: FLEXERIL Take 1 tablet (10 mg total) by mouth 3 (three) times daily as needed for muscle spasms.   diazepam 5 MG tablet Commonly known as: VALIUM Take 1 tablet (5 mg total) by mouth every 8 (eight) hours as needed for muscle spasms.   fish oil-omega-3 fatty acids 1000 MG capsule Take 1 g by mouth daily.   fluticasone 50 MCG/ACT nasal spray Commonly known as: FLONASE Place 1 spray into both nostrils 2 (two) times daily. For congestion   gabapentin 300 MG capsule Commonly known as: NEURONTIN TAKE 2 CAPSULES BY MOUTH 3 TIMES A DAY What changed:   how much to take  when to take this   Ginseng 100 MG Caps Take 400 mg by mouth daily.   guaiFENesin 600 MG 12 hr tablet Commonly known as: MUCINEX Take 600 mg by mouth at bedtime as needed (Congestion).   hydrocortisone cream 1 % Apply 1 application topically daily as needed for itching.   magnesium gluconate 500 MG tablet Commonly known as: MAGONATE Take 500 mg by mouth daily.   Melatonin 10 MG Tabs Take 10 mg by mouth at bedtime as needed (sleep).   METAMUCIL FIBER PO Take 1 Scoop by mouth daily.   MSM 1000 MG Caps Take 1,000 mg by mouth daily.   multivitamin with minerals tablet  Take 1 tablet by mouth daily.   neomycin-bacitracin-polymyxin ointment Commonly known as: NEOSPORIN Apply 1 application topically daily as needed for wound care.   NONFORMULARY OR COMPOUNDED ITEM Apply 1 g topically every evening. Doxepin 5%, Amantadine 3%, Pentoxifylline 3%, Lamotrigine 2%, Sertraline 3%, Gabapentin 6% PCCA Lipoderm Base Cream   olmesartan 20 MG tablet Commonly known as: BENICAR Take 20 mg by mouth daily.   omeprazole 40 MG capsule Commonly known as: PRILOSEC Take 40 mg by mouth daily.   Oxycodone HCl 10 MG Tabs Take 10-20 mg by mouth daily as needed (pain).   Rapaflo 8 MG Caps  capsule Generic drug: silodosin Take 8 mg by mouth at bedtime.   selenium 200 MCG Tabs tablet Take 200 mcg by mouth daily.   sodium chloride 0.65 % Soln nasal spray Commonly known as: OCEAN Place 1 spray into both nostrils at bedtime.   testosterone cypionate 200 MG/ML injection Commonly known as: DEPOTESTOSTERONE CYPIONATE Inject 200 mg into the muscle every 14 (fourteen) days. 0.75 mL   Turmeric 500 MG Caps Take 500 mg by mouth daily.   venlafaxine XR 150 MG 24 hr capsule Commonly known as: EFFEXOR-XR Take 150 mg by mouth daily with breakfast.   Vitamin D3 25 MCG tablet Commonly known as: Vitamin D Take 1,000 Units by mouth daily.   vitamin E 180 MG (400 UNITS) capsule Take 400 Units by mouth daily.        Signed: Cooper Render Briselda Naval 05/24/2020, 3:54 PM

## 2020-05-25 ENCOUNTER — Encounter (HOSPITAL_COMMUNITY): Payer: Self-pay | Admitting: Neurosurgery

## 2020-06-03 DIAGNOSIS — M5412 Radiculopathy, cervical region: Secondary | ICD-10-CM | POA: Diagnosis not present

## 2020-06-08 ENCOUNTER — Other Ambulatory Visit: Payer: Self-pay | Admitting: Rheumatology

## 2020-06-08 NOTE — Telephone Encounter (Signed)
Next Visit: 11/04/2020  Last Visit: 05/04/2020  Last Fill: 07/24/2019  DX: Idiopathic chronic gout of multiple sites without tophus   Current Dose per office note 05/04/2020, allopurinol 450 mg po daily   Labs: 05/20/2020, Hemoglobin 17.6, Glucose 129, 11/04/2019 Uric acid 4.8  Okay to refill Allopurinol?

## 2020-08-04 DIAGNOSIS — M5416 Radiculopathy, lumbar region: Secondary | ICD-10-CM | POA: Diagnosis not present

## 2020-08-04 DIAGNOSIS — M5412 Radiculopathy, cervical region: Secondary | ICD-10-CM | POA: Diagnosis not present

## 2020-08-06 ENCOUNTER — Other Ambulatory Visit: Payer: Self-pay | Admitting: Neurosurgery

## 2020-08-06 DIAGNOSIS — M5416 Radiculopathy, lumbar region: Secondary | ICD-10-CM

## 2020-08-09 ENCOUNTER — Other Ambulatory Visit: Payer: Self-pay | Admitting: Physician Assistant

## 2020-08-09 NOTE — Telephone Encounter (Signed)
Next Visit: 11/04/2020   Last Visit: 05/04/2020   Last Fill: 05/18/2020  DX: Idiopathic chronic gout of multiple sites without tophus -   Current Dose per office note 05/04/2020, colchicine 0.6 mg 1 tablet by mouth BID as needed  Labs: 05/03/2020 HGB 17.5 MCV 97.8 Glucose 113 Sodium 135 AST 49 Chloride 97 Cholesterol 270 CHOL/HDL 5.2 Triglyceride 240 NHDL 217 LDL 172 Uric acid within desirable range  Okay to refill Colchicine?

## 2020-08-12 DIAGNOSIS — M21622 Bunionette of left foot: Secondary | ICD-10-CM | POA: Diagnosis not present

## 2020-08-12 DIAGNOSIS — M21621 Bunionette of right foot: Secondary | ICD-10-CM | POA: Diagnosis not present

## 2020-08-21 ENCOUNTER — Ambulatory Visit
Admission: RE | Admit: 2020-08-21 | Discharge: 2020-08-21 | Disposition: A | Discharge status: Home / Self Care | Payer: Medicare Other | Source: Ambulatory Visit | Attending: Neurosurgery | Admitting: Neurosurgery

## 2020-08-21 ENCOUNTER — Other Ambulatory Visit: Payer: Self-pay

## 2020-08-21 DIAGNOSIS — M48061 Spinal stenosis, lumbar region without neurogenic claudication: Secondary | ICD-10-CM | POA: Diagnosis not present

## 2020-08-21 DIAGNOSIS — M5116 Intervertebral disc disorders with radiculopathy, lumbar region: Secondary | ICD-10-CM | POA: Diagnosis not present

## 2020-08-21 DIAGNOSIS — M5416 Radiculopathy, lumbar region: Secondary | ICD-10-CM

## 2020-08-21 MED ORDER — GADOBENATE DIMEGLUMINE 529 MG/ML IV SOLN
20.0000 mL | Freq: Once | INTRAVENOUS | Status: AC | PRN
  Administered 2020-08-21: 20 mL via INTRAVENOUS

## 2020-09-06 DIAGNOSIS — M5416 Radiculopathy, lumbar region: Secondary | ICD-10-CM | POA: Diagnosis not present

## 2020-09-13 DIAGNOSIS — M19011 Primary osteoarthritis, right shoulder: Secondary | ICD-10-CM | POA: Diagnosis not present

## 2020-09-13 DIAGNOSIS — Z96612 Presence of left artificial shoulder joint: Secondary | ICD-10-CM | POA: Diagnosis not present

## 2020-09-14 ENCOUNTER — Other Ambulatory Visit: Payer: Self-pay | Admitting: Physician Assistant

## 2020-09-14 NOTE — Telephone Encounter (Signed)
Next Visit: 11/04/2020   Last Visit: 05/04/2020   Last Fill: 06/08/2020   DX: Idiopathic chronic gout of multiple sites without tophus    Current Dose per office note 05/04/2020, allopurinol 450 mg po daily    Labs: 05/20/2020, Hemoglobin 17.6, Glucose 129, 11/04/2019 Uric acid 4.8   Okay to refill Allopurinol?

## 2020-09-28 DIAGNOSIS — Z23 Encounter for immunization: Secondary | ICD-10-CM | POA: Diagnosis not present

## 2020-09-28 DIAGNOSIS — M797 Fibromyalgia: Secondary | ICD-10-CM | POA: Diagnosis not present

## 2020-09-28 DIAGNOSIS — J309 Allergic rhinitis, unspecified: Secondary | ICD-10-CM | POA: Diagnosis not present

## 2020-09-28 DIAGNOSIS — M542 Cervicalgia: Secondary | ICD-10-CM | POA: Diagnosis not present

## 2020-09-28 DIAGNOSIS — M25511 Pain in right shoulder: Secondary | ICD-10-CM | POA: Diagnosis not present

## 2020-09-28 DIAGNOSIS — R079 Chest pain, unspecified: Secondary | ICD-10-CM | POA: Diagnosis not present

## 2020-09-28 DIAGNOSIS — K219 Gastro-esophageal reflux disease without esophagitis: Secondary | ICD-10-CM | POA: Diagnosis not present

## 2020-09-28 DIAGNOSIS — M109 Gout, unspecified: Secondary | ICD-10-CM | POA: Diagnosis not present

## 2020-09-28 DIAGNOSIS — R7303 Prediabetes: Secondary | ICD-10-CM | POA: Diagnosis not present

## 2020-09-28 DIAGNOSIS — E782 Mixed hyperlipidemia: Secondary | ICD-10-CM | POA: Diagnosis not present

## 2020-09-28 DIAGNOSIS — I10 Essential (primary) hypertension: Secondary | ICD-10-CM | POA: Diagnosis not present

## 2020-10-04 DIAGNOSIS — M5416 Radiculopathy, lumbar region: Secondary | ICD-10-CM | POA: Diagnosis not present

## 2020-10-18 DIAGNOSIS — H00016 Hordeolum externum left eye, unspecified eyelid: Secondary | ICD-10-CM | POA: Diagnosis not present

## 2020-10-19 NOTE — Patient Instructions (Signed)
DUE TO COVID-19 ONLY ONE VISITOR IS ALLOWED TO COME WITH YOU AND STAY IN THE WAITING ROOM ONLY DURING PRE OP AND PROCEDURE DAY OF SURGERY. THE 1 VISITOR  MAY VISIT WITH YOU AFTER SURGERY IN YOUR PRIVATE ROOM DURING VISITING HOURS ONLY!                 [place]     Your procedure is scheduled on: 11/04/20   Report to [facility] Main  Entrance   Report to admitting at 7:15 AM     Call this number if you have problems the morning of surgery Victor [name], NO CHEWING GUM [place].   No food after midnight.    You may have clear liquid until 7:00 AM.    At 6:30 AM drink pre surgery drink.   Nothing by mouth after 7:00 AM.    Take these medicines the morning of surgery with A SIP OF WATER: venafaxine, Atenolol, Allopurinol, Omeprazole                                You may not have any metal on your body including              piercings  Do not wear jewelry, lotions, powders or deodorant                        Men may shave face and neck.   Do not bring valuables to the hospital. [place].  Contacts, dentures or bridgework may not be worn into surgery.       Patients discharged the day of surgery will not be allowed to drive home.   IF YOU ARE HAVING SURGERY AND GOING HOME THE SAME DAY, YOU MUST HAVE AN ADULT TO DRIVE YOU HOME AND BE WITH YOU FOR 24 HOURS.  YOU MAY GO HOME BY TAXI OR UBER OR ORTHERWISE, BUT AN ADULT MUST ACCOMPANY YOU HOME AND STAY WITH YOU FOR 24 HOURS.  Name and phone number of your driver:  Special Instructions: N/A              Please read over the following fact sheets you were given: _____________________________________________________________________  [facility]- Preparing for Total Shoulder Arthroplasty    Before surgery, you can play an important role. Because skin is not sterile, your skin needs to be as  free of germs as possible. You can reduce the number of germs on your skin by using the following products. Benzoyl Peroxide Gel Reduces the number of germs present on the skin Applied twice a day to shoulder area starting two days before surgery    ==================================================================  Please follow these instructions carefully:  BENZOYL PEROXIDE 5% GEL  Please do not use if you have an allergy to benzoyl peroxide.   If your skin becomes reddened/irritated stop using the benzoyl peroxide.  Starting two days before surgery, apply as follows: Apply benzoyl peroxide in the morning and at night. Apply after taking a shower. If you are not taking a shower clean entire shoulder front, back, and side along with the armpit with a clean wet washcloth.  Place a quarter-sized dollop on your shoulder and rub in thoroughly, making sure to  cover the front, back, and side of your shoulder, along with the armpit.   2 days before ____ AM   ____ PM              1 day before ____ AM   ____ PM                         Do this twice a day for two days.  (Last application is the night before surgery, AFTER using the CHG soap as described below).  Do NOT apply benzoyl peroxide gel on the day of surgery.            [place] - Preparing for Surgery  Before surgery, you can play an important role.  Because skin is not sterile, your skin needs to be as free of germs as possible.  You can reduce the number of germs on your skin by washing with CHG (chlorahexidine gluconate) soap before surgery.  CHG is an antiseptic cleaner which kills germs and bonds with the skin to continue killing germs even after washing. Please DO NOT use if you have an allergy to CHG or antibacterial soaps.  If your skin becomes reddened/irritated stop using the CHG and inform your nurse when you arrive at Short Stay.   You may shave your face/neck. Please follow these instructions carefully:  1.  Shower  with CHG Soap the night before surgery and the  morning of Surgery.  2.  If you choose to wash your hair, wash your hair first as usual with your  normal  shampoo.  3.  After you shampoo, rinse your hair and body thoroughly to remove the  shampoo.                            4.  Use CHG as you would any other liquid soap.  You can apply chg directly  to the skin and wash                       Gently with a scrungie or clean washcloth.  5.  Apply the CHG Soap to your body ONLY FROM THE NECK DOWN.   Do not use on face/ open                           Wound or open sores. Avoid contact with eyes, ears mouth and genitals (private parts).                       Wash face,  Genitals (private parts) with your normal soap.             6.  Wash thoroughly, paying special attention to the area where your surgery  will be performed.  7.  Thoroughly rinse your body with warm water from the neck down.  8.  DO NOT shower/wash with your normal soap after using and rinsing off  the CHG Soap.             9.  Pat yourself dry with a clean towel.            10.  Wear clean pajamas.            11.  Place clean sheets on your bed the night of your first shower and do not  sleep with pets. Day of  Surgery : Do not apply any lotions/deodorants the morning of surgery.  Please wear clean clothes to the hospital/surgery center.  FAILURE TO FOLLOW THESE INSTRUCTIONS MAY RESULT IN THE CANCELLATION OF YOUR SURGERY PATIENT SIGNATURE_________________________________  NURSE SIGNATURE__________________________________  ________________________________________________________________________   Victor [name]  An incentive spirometer is a tool that can help keep your lungs clear and active. This tool measures how well you are filling your lungs with each breath. Taking long deep breaths may help reverse or decrease the chance of developing breathing (pulmonary) problems (especially infection) following: A long period of  time when you are unable to move or be active. BEFORE THE PROCEDURE  If the spirometer includes an indicator to show your best effort, your nurse or respiratory therapist will set it to a desired goal. If possible, sit up straight or lean slightly forward. Try not to slouch. Hold the incentive spirometer in an upright position. INSTRUCTIONS FOR USE  Sit on the edge of your bed if possible, or sit up as far as you can in bed or on a chair. Hold the incentive spirometer in an upright position. Breathe out normally. Place the mouthpiece in your mouth and seal your lips tightly around it. Breathe in slowly and as deeply as possible, raising the piston or the ball toward the top of the column. Hold your breath for 3-5 seconds or for as long as possible. Allow the piston or ball to fall to the bottom of the column. Remove the mouthpiece from your mouth and breathe out normally. Rest for a few seconds and repeat Steps 1 through 7 at least 10 times every 1-2 hours when you are awake. Take your time and take a few normal breaths between deep breaths. The spirometer may include an indicator to show your best effort. Use the indicator as a goal to work toward during each repetition. After each set of 10 deep breaths, practice coughing to be sure your lungs are clear. If you have an incision (the cut made at the time of surgery), support your incision when coughing by placing a pillow or rolled up towels firmly against it. Once you are able to get out of bed, walk around indoors and cough well. You may stop using the incentive spirometer when instructed by your caregiver.  RISKS AND COMPLICATIONS Take your time so you do not get dizzy or light-headed. If you are in pain, you may need to take or ask for pain medication before doing incentive spirometry. It is harder to take a deep breath if you are having pain. AFTER USE Rest and breathe slowly and easily. It can be helpful to keep track of a log of your  progress. Your caregiver can provide you with a simple table to help with this. If you are using the spirometer at home, follow these instructions: [place] IF:  You are having difficultly using the spirometer. You have trouble using the spirometer as often as instructed. Your pain medication is not giving enough relief while using the spirometer. You develop fever of 100.5 F (38.1 C) or higher. SEEK IMMEDIATE MEDICAL CARE IF:  You cough up bloody sputum that had not been present before. You develop fever of 102 F (38.9 C) or greater. You develop worsening pain at or near the incision site. MAKE SURE YOU:  Understand these instructions. Will watch your condition. Will get help right away if you are not doing well or get worse. Document Released: 06/26/2006 Document Revised: 05/08/2011 Document Reviewed: 08/27/2006 ExitCare  Patient Information [address] [place].   ________________________________________________________________________

## 2020-10-20 ENCOUNTER — Encounter (HOSPITAL_COMMUNITY): Payer: Self-pay

## 2020-10-20 ENCOUNTER — Other Ambulatory Visit: Payer: Self-pay

## 2020-10-20 ENCOUNTER — Encounter (HOSPITAL_COMMUNITY)
Admission: RE | Admit: 2020-10-20 | Discharge: 2020-10-20 | Disposition: A | Discharge status: Home / Self Care | Payer: Medicare Other | Source: Ambulatory Visit | Attending: Orthopedic Surgery | Admitting: Orthopedic Surgery

## 2020-10-20 DIAGNOSIS — Z01812 Encounter for preprocedural laboratory examination: Secondary | ICD-10-CM | POA: Insufficient documentation

## 2020-10-20 LAB — BASIC METABOLIC PANEL
Anion gap: 10 (ref 5–15)
BUN: 16 mg/dL (ref 8–23)
CO2: 25 mmol/L (ref 22–32)
Calcium: 9.7 mg/dL (ref 8.9–10.3)
Chloride: 102 mmol/L (ref 98–111)
Creatinine, Ser: 1.27 mg/dL — ABNORMAL HIGH (ref 0.61–1.24)
GFR, Estimated: >60 mL/min (ref >60)
Glucose, Bld: 117 mg/dL — ABNORMAL HIGH (ref 70–99)
Potassium: 4.2 mmol/L (ref 3.5–5.1)
Sodium: 137 mmol/L (ref 135–145)

## 2020-10-20 LAB — SURGICAL PCR SCREEN
MRSA, PCR: NEGATIVE
Staphylococcus aureus: NEGATIVE

## 2020-10-20 LAB — CBC
HCT: 51.3 % (ref 39.0–52.0)
Hemoglobin: 17.4 g/dL — ABNORMAL HIGH (ref 13.0–17.0)
MCH: 32.7 pg (ref 26.0–34.0)
MCHC: 33.9 g/dL (ref 30.0–36.0)
MCV: 96.4 fL (ref 80.0–100.0)
Platelets: 194 10*3/uL (ref 150–400)
RBC: 5.32 MIL/uL (ref 4.22–5.81)
RDW: 13.2 % (ref 11.5–15.5)
WBC: 6.5 10*3/uL (ref 4.0–10.5)
nRBC: 0 % (ref 0.0–0.2)

## 2020-10-20 LAB — HEMOGLOBIN A1C
Hgb A1c MFr Bld: 6 % — ABNORMAL HIGH (ref 4.8–5.6)
Mean Plasma Glucose: 125.5 mg/dL

## 2020-10-20 NOTE — Progress Notes (Signed)
COVID Test N/A  PCP - Dr. Iline Oven LOV 05/13/20-epic Cardiologist - no Rheumatologist- Dr. Arlean Hopping  Chest x-ray - no EKG - 05/20/20-epic Stress Test - no ECHO - no Cardiac Cath - no Pacemaker/ICD device last checked:NA  Sleep Study - yes CPAP - no. C-Pap machine is too drying for his throat. He had a 25 lb weight loss.  Fasting Blood Sugar - no Checks Blood Sugar _____ times a day  Blood Thinner Instructions:NA Aspirin Instructions: Last Dose:  Anesthesia review: yes  Patient denies shortness of breath, fever, cough and chest pain at PAT appointment yes Pt has multi surgery on his neck but reports that he hasn't had any problems with intubation. Pt is able to climb stairs slowly he doesn't do housework. He used to work out but is now unable.  Patient verbalized understanding of instructions that were given to them at the PAT appointment. Patient was also instructed that they will need to review over the PAT instructions again at home before surgery. yes

## 2020-10-21 DIAGNOSIS — M5416 Radiculopathy, lumbar region: Secondary | ICD-10-CM | POA: Diagnosis not present

## 2020-11-04 ENCOUNTER — Encounter (HOSPITAL_COMMUNITY): Payer: Self-pay | Admitting: Orthopedic Surgery

## 2020-11-04 ENCOUNTER — Ambulatory Visit (HOSPITAL_COMMUNITY): Payer: Medicare Other | Admitting: Physician Assistant

## 2020-11-04 ENCOUNTER — Ambulatory Visit (HOSPITAL_COMMUNITY)
Admission: RE | Admit: 2020-11-04 | Discharge: 2020-11-04 | Disposition: A | Discharge status: Home / Self Care | Payer: Medicare Other | Attending: Orthopedic Surgery | Admitting: Orthopedic Surgery

## 2020-11-04 ENCOUNTER — Ambulatory Visit (HOSPITAL_COMMUNITY): Payer: Medicare Other | Admitting: Certified Registered Nurse Anesthetist

## 2020-11-04 ENCOUNTER — Ambulatory Visit: Payer: Medicare Other | Admitting: Rheumatology

## 2020-11-04 ENCOUNTER — Encounter (HOSPITAL_COMMUNITY)
Admission: RE | Disposition: A | Discharge status: Home / Self Care | Payer: Self-pay | Source: Home / Self Care | Attending: Orthopedic Surgery

## 2020-11-04 DIAGNOSIS — Z85820 Personal history of malignant melanoma of skin: Secondary | ICD-10-CM | POA: Diagnosis not present

## 2020-11-04 DIAGNOSIS — G629 Polyneuropathy, unspecified: Secondary | ICD-10-CM | POA: Diagnosis not present

## 2020-11-04 DIAGNOSIS — Z9852 Vasectomy status: Secondary | ICD-10-CM | POA: Diagnosis not present

## 2020-11-04 DIAGNOSIS — I739 Peripheral vascular disease, unspecified: Secondary | ICD-10-CM | POA: Insufficient documentation

## 2020-11-04 DIAGNOSIS — G8918 Other acute postprocedural pain: Secondary | ICD-10-CM | POA: Diagnosis not present

## 2020-11-04 DIAGNOSIS — Z87891 Personal history of nicotine dependence: Secondary | ICD-10-CM | POA: Insufficient documentation

## 2020-11-04 DIAGNOSIS — M75101 Unspecified rotator cuff tear or rupture of right shoulder, not specified as traumatic: Secondary | ICD-10-CM | POA: Diagnosis not present

## 2020-11-04 DIAGNOSIS — Z8614 Personal history of Methicillin resistant Staphylococcus aureus infection: Secondary | ICD-10-CM | POA: Insufficient documentation

## 2020-11-04 DIAGNOSIS — Z9049 Acquired absence of other specified parts of digestive tract: Secondary | ICD-10-CM | POA: Insufficient documentation

## 2020-11-04 DIAGNOSIS — M19011 Primary osteoarthritis, right shoulder: Secondary | ICD-10-CM | POA: Diagnosis not present

## 2020-11-04 DIAGNOSIS — Z96651 Presence of right artificial knee joint: Secondary | ICD-10-CM | POA: Insufficient documentation

## 2020-11-04 DIAGNOSIS — Z803 Family history of malignant neoplasm of breast: Secondary | ICD-10-CM | POA: Insufficient documentation

## 2020-11-04 DIAGNOSIS — R7303 Prediabetes: Secondary | ICD-10-CM | POA: Diagnosis not present

## 2020-11-04 DIAGNOSIS — E7800 Pure hypercholesterolemia, unspecified: Secondary | ICD-10-CM | POA: Diagnosis not present

## 2020-11-04 DIAGNOSIS — K219 Gastro-esophageal reflux disease without esophagitis: Secondary | ICD-10-CM | POA: Diagnosis not present

## 2020-11-04 DIAGNOSIS — Z79899 Other long term (current) drug therapy: Secondary | ICD-10-CM | POA: Insufficient documentation

## 2020-11-04 DIAGNOSIS — G4730 Sleep apnea, unspecified: Secondary | ICD-10-CM | POA: Insufficient documentation

## 2020-11-04 DIAGNOSIS — I10 Essential (primary) hypertension: Secondary | ICD-10-CM | POA: Insufficient documentation

## 2020-11-04 DIAGNOSIS — M12811 Other specific arthropathies, not elsewhere classified, right shoulder: Secondary | ICD-10-CM | POA: Diagnosis not present

## 2020-11-04 DIAGNOSIS — Z8249 Family history of ischemic heart disease and other diseases of the circulatory system: Secondary | ICD-10-CM | POA: Diagnosis not present

## 2020-11-04 DIAGNOSIS — E785 Hyperlipidemia, unspecified: Secondary | ICD-10-CM | POA: Diagnosis not present

## 2020-11-04 HISTORY — PX: TOTAL SHOULDER ARTHROPLASTY: SHX126

## 2020-11-04 SURGERY — ARTHROPLASTY, SHOULDER, TOTAL
Anesthesia: General | Site: Shoulder | Laterality: Right

## 2020-11-04 MED ORDER — LACTATED RINGERS IV SOLN: INTRAVENOUS | Status: DC

## 2020-11-04 MED ORDER — LACTATED RINGERS IV BOLUS
500.0000 mL | Freq: Once | INTRAVENOUS | Status: AC
  Administered 2020-11-04: 500 mL via INTRAVENOUS

## 2020-11-04 MED ORDER — VASOPRESSIN 20 UNIT/ML IV SOLN: INTRAVENOUS | Status: DC | PRN

## 2020-11-04 MED ORDER — BUPIVACAINE LIPOSOME 1.3 % IJ SUSP
INTRAMUSCULAR | Status: DC | PRN
  Administered 2020-11-04: 10 mL via PERINEURAL

## 2020-11-04 MED ORDER — ONDANSETRON HCL 4 MG PO TABS: 4.0000 mg | ORAL_TABLET | Freq: Three times a day (TID) | ORAL | 0 refills | Status: DC | PRN

## 2020-11-04 MED ORDER — ORAL CARE MOUTH RINSE: 15.0000 mL | Freq: Once | OROMUCOSAL | Status: AC

## 2020-11-04 MED ORDER — CYCLOBENZAPRINE HCL 10 MG PO TABS: 10.0000 mg | ORAL_TABLET | Freq: Three times a day (TID) | ORAL | 1 refills | Status: DC | PRN

## 2020-11-04 MED ORDER — ONDANSETRON HCL 4 MG/2ML IJ SOLN: 4.0000 mg | Freq: Once | INTRAMUSCULAR | Status: DC | PRN

## 2020-11-04 MED ORDER — TRANEXAMIC ACID-NACL 1000-0.7 MG/100ML-% IV SOLN
1000.0000 mg | INTRAVENOUS | Status: AC
  Administered 2020-11-04: 1000 mg via INTRAVENOUS
  Filled 2020-11-04: qty 100

## 2020-11-04 MED ORDER — OXYCODONE HCL 5 MG PO TABS: 5.0000 mg | ORAL_TABLET | Freq: Once | ORAL | Status: DC | PRN

## 2020-11-04 MED ORDER — ROCURONIUM BROMIDE 10 MG/ML (PF) SYRINGE
PREFILLED_SYRINGE | INTRAVENOUS | Status: DC | PRN
  Administered 2020-11-04: 70 mg via INTRAVENOUS
  Administered 2020-11-04: 20 mg via INTRAVENOUS

## 2020-11-04 MED ORDER — VASOPRESSIN 20 UNIT/ML IV SOLN
INTRAVENOUS | Status: AC
  Filled 2020-11-04: qty 1

## 2020-11-04 MED ORDER — FENTANYL CITRATE PF 50 MCG/ML IJ SOSY
50.0000 ug | PREFILLED_SYRINGE | INTRAMUSCULAR | Status: DC
  Administered 2020-11-04: 50 ug via INTRAVENOUS
  Filled 2020-11-04: qty 2

## 2020-11-04 MED ORDER — BACITRACIN-NEOMYCIN-POLYMYXIN OINTMENT TUBE
TOPICAL_OINTMENT | CUTANEOUS | Status: AC
  Filled 2020-11-04: qty 14.17

## 2020-11-04 MED ORDER — VANCOMYCIN HCL 1000 MG IV SOLR
INTRAVENOUS | Status: DC | PRN
  Administered 2020-11-04: 1000 mg

## 2020-11-04 MED ORDER — FENTANYL CITRATE (PF) 100 MCG/2ML IJ SOLN
INTRAMUSCULAR | Status: AC
  Filled 2020-11-04: qty 2

## 2020-11-04 MED ORDER — MIDAZOLAM HCL 2 MG/2ML IJ SOLN
1.0000 mg | INTRAMUSCULAR | Status: DC
  Administered 2020-11-04: 1 mg via INTRAVENOUS
  Filled 2020-11-04: qty 2

## 2020-11-04 MED ORDER — ONDANSETRON HCL 4 MG/2ML IJ SOLN
INTRAMUSCULAR | Status: DC | PRN
Start: 2020-11-04 — End: 2020-11-04
  Administered 2020-11-04: 4 mg via INTRAVENOUS

## 2020-11-04 MED ORDER — BUPIVACAINE-EPINEPHRINE (PF) 0.5% -1:200000 IJ SOLN
INTRAMUSCULAR | Status: DC | PRN
  Administered 2020-11-04: 20 mL via PERINEURAL

## 2020-11-04 MED ORDER — PROPOFOL 10 MG/ML IV BOLUS
INTRAVENOUS | Status: DC | PRN
  Administered 2020-11-04: 170 mg via INTRAVENOUS

## 2020-11-04 MED ORDER — LACTATED RINGERS IV BOLUS
250.0000 mL | Freq: Once | INTRAVENOUS | Status: AC
  Administered 2020-11-04: 250 mL via INTRAVENOUS

## 2020-11-04 MED ORDER — VASOPRESSIN 20 UNIT/ML IV SOLN
INTRAVENOUS | Status: DC | PRN
  Administered 2020-11-04: 1 [IU] via INTRAVENOUS

## 2020-11-04 MED ORDER — PROPOFOL 10 MG/ML IV BOLUS
INTRAVENOUS | Status: AC
  Filled 2020-11-04: qty 20

## 2020-11-04 MED ORDER — LIDOCAINE 2% (20 MG/ML) 5 ML SYRINGE
INTRAMUSCULAR | Status: DC | PRN
Start: 2020-11-04 — End: 2020-11-04
  Administered 2020-11-04: 60 mg via INTRAVENOUS

## 2020-11-04 MED ORDER — OXYCODONE HCL 5 MG/5ML PO SOLN: 5.0000 mg | Freq: Once | ORAL | Status: DC | PRN

## 2020-11-04 MED ORDER — CHLORHEXIDINE GLUCONATE 0.12 % MT SOLN
15.0000 mL | Freq: Once | OROMUCOSAL | Status: AC
  Administered 2020-11-04: 15 mL via OROMUCOSAL

## 2020-11-04 MED ORDER — NAPROXEN 500 MG PO TABS: 500.0000 mg | ORAL_TABLET | Freq: Two times a day (BID) | ORAL | 1 refills | Status: DC

## 2020-11-04 MED ORDER — VANCOMYCIN HCL 1000 MG IV SOLR
INTRAVENOUS | Status: AC
  Filled 2020-11-04: qty 20

## 2020-11-04 MED ORDER — HYDROMORPHONE HCL 4 MG PO TABS: 2.0000 mg | ORAL_TABLET | ORAL | 0 refills | Status: DC | PRN

## 2020-11-04 MED ORDER — FENTANYL CITRATE (PF) 100 MCG/2ML IJ SOLN
INTRAMUSCULAR | Status: DC | PRN
  Administered 2020-11-04: 100 ug via INTRAVENOUS

## 2020-11-04 MED ORDER — HYDROMORPHONE HCL 1 MG/ML IJ SOLN: 0.2500 mg | INTRAMUSCULAR | Status: DC | PRN

## 2020-11-04 MED ORDER — PHENYLEPHRINE 40 MCG/ML (10ML) SYRINGE FOR IV PUSH (FOR BLOOD PRESSURE SUPPORT)
PREFILLED_SYRINGE | INTRAVENOUS | Status: DC | PRN
  Administered 2020-11-04: 160 ug via INTRAVENOUS
  Administered 2020-11-04: 80 ug via INTRAVENOUS

## 2020-11-04 MED ORDER — DEXAMETHASONE SODIUM PHOSPHATE 10 MG/ML IJ SOLN
INTRAMUSCULAR | Status: DC | PRN
  Administered 2020-11-04: 10 mg via INTRAVENOUS

## 2020-11-04 MED ORDER — 0.9 % SODIUM CHLORIDE (POUR BTL) OPTIME
TOPICAL | Status: DC | PRN
  Administered 2020-11-04: 650 mL

## 2020-11-04 MED ORDER — GLYCOPYRROLATE 0.2 MG/ML IJ SOLN
INTRAMUSCULAR | Status: DC | PRN
Start: 2020-11-04 — End: 2020-11-04
  Administered 2020-11-04: .2 mg via INTRAVENOUS

## 2020-11-04 MED ORDER — EPHEDRINE SULFATE-NACL 50-0.9 MG/10ML-% IV SOSY
PREFILLED_SYRINGE | INTRAVENOUS | Status: DC | PRN
  Administered 2020-11-04: 5 mg via INTRAVENOUS
  Administered 2020-11-04: 10 mg via INTRAVENOUS
  Administered 2020-11-04: 15 mg via INTRAVENOUS
  Administered 2020-11-04: 10 mg via INTRAVENOUS

## 2020-11-04 MED ORDER — BACITRACIN-NEOMYCIN-POLYMYXIN 400-5-5000 EX OINT
TOPICAL_OINTMENT | CUTANEOUS | Status: DC | PRN
  Administered 2020-11-04: 1 via TOPICAL

## 2020-11-04 MED ORDER — GLYCOPYRROLATE 0.2 MG/ML IJ SOLN: INTRAMUSCULAR | Status: DC | PRN

## 2020-11-04 MED ORDER — CEFAZOLIN SODIUM-DEXTROSE 2-4 GM/100ML-% IV SOLN
2.0000 g | INTRAVENOUS | Status: AC
  Administered 2020-11-04: 2 g via INTRAVENOUS
  Filled 2020-11-04: qty 100

## 2020-11-04 SURGICAL SUPPLY — 71 items
ADH SKN CLS APL DERMABOND .7 (GAUZE/BANDAGES/DRESSINGS) ×1
AID PSTN UNV HD RSTRNT DISP (MISCELLANEOUS) ×1
BAG COUNTER SPONGE SURGICOUNT (BAG) ×1 IMPLANT
BAG SPEC THK2 15X12 ZIP CLS (MISCELLANEOUS) ×1
BAG SPNG CNTER NS LX DISP (BAG) ×1
BAG ZIPLOCK 12X15 (MISCELLANEOUS) ×2 IMPLANT
BIT DRILL 2.0X128 (BIT) IMPLANT
BLADE SAW SGTL 83.5X18.5 (BLADE) ×2 IMPLANT
BODY HUM ECLIPSE TRUNION OD 51 (Shoulder) ×1 IMPLANT
CEMENT BONE DEPUY (Cement) ×2 IMPLANT
COOLER ICEMAN CLASSIC (MISCELLANEOUS) ×1 IMPLANT
COVER BACK TABLE 60X90IN (DRAPES) ×2 IMPLANT
COVER SURGICAL LIGHT HANDLE (MISCELLANEOUS) ×2 IMPLANT
DERMABOND ADVANCED (GAUZE/BANDAGES/DRESSINGS) ×1
DERMABOND ADVANCED .7 DNX12 (GAUZE/BANDAGES/DRESSINGS) ×1 IMPLANT
DRAPE ORTHO SPLIT 77X108 STRL (DRAPES) ×4
DRAPE SHEET LG 3/4 BI-LAMINATE (DRAPES) ×2 IMPLANT
DRAPE SURG 17X11 SM STRL (DRAPES) ×2 IMPLANT
DRAPE SURG ORHT 6 SPLT 77X108 (DRAPES) ×2 IMPLANT
DRAPE TOP 10253 STERILE (DRAPES) ×2 IMPLANT
DRAPE U-SHAPE 47X51 STRL (DRAPES) ×2 IMPLANT
DRSG AQUACEL AG ADV 3.5X 6 (GAUZE/BANDAGES/DRESSINGS) ×1 IMPLANT
DRSG AQUACEL AG ADV 3.5X10 (GAUZE/BANDAGES/DRESSINGS) ×2 IMPLANT
DURAPREP 26ML APPLICATOR (WOUND CARE) ×2 IMPLANT
ELECT BLADE TIP CTD 4 INCH (ELECTRODE) ×2 IMPLANT
ELECT REM PT RETURN 15FT ADLT (MISCELLANEOUS) ×2 IMPLANT
FACESHIELD WRAPAROUND (MASK) ×8 IMPLANT
FACESHIELD WRAPAROUND OR TEAM (MASK) ×4 IMPLANT
GLENOID UNI VAULTLOCK LRG (Shoulder) ×1 IMPLANT
GLOVE SRG 8 PF TXTR STRL LF DI (GLOVE) ×1 IMPLANT
GLOVE SURG ENC MOIS LTX SZ7 (GLOVE) ×2 IMPLANT
GLOVE SURG ENC MOIS LTX SZ7.5 (GLOVE) ×2 IMPLANT
GLOVE SURG ENC MOIS LTX SZ8 (GLOVE) ×2 IMPLANT
GLOVE SURG UNDER POLY LF SZ7 (GLOVE) ×2 IMPLANT
GLOVE SURG UNDER POLY LF SZ8 (GLOVE) ×2
GOWN STRL REUS W/TWL LRG LVL3 (GOWN DISPOSABLE) ×4 IMPLANT
HEAD HUMERAL ECLIPSE 49/20 (Shoulder) ×1 IMPLANT
IMPL ECLIPSE SPEEDCAP (Shoulder) IMPLANT
IMPLANT ECLIPSE SPEEDCAP (Shoulder) ×2 IMPLANT
KIT BASIN OR (CUSTOM PROCEDURE TRAY) ×2 IMPLANT
KIT TURNOVER KIT A (KITS) ×2 IMPLANT
MANIFOLD NEPTUNE II (INSTRUMENTS) ×2 IMPLANT
NDL TAPERED W/ NITINOL LOOP (MISCELLANEOUS) IMPLANT
NEEDLE TAPERED W/ NITINOL LOOP (MISCELLANEOUS) IMPLANT
NS IRRIG 1000ML POUR BTL (IV SOLUTION) ×2 IMPLANT
PACK SHOULDER (CUSTOM PROCEDURE TRAY) ×2 IMPLANT
PAD COLD SHLDR WRAP-ON (PAD) ×1 IMPLANT
PIN NITINOL TARGETER 2.8 (PIN) IMPLANT
PIN SET MODULAR GLENOID SYSTEM (PIN) ×1 IMPLANT
PROTECTOR NERVE ULNAR (MISCELLANEOUS) ×2 IMPLANT
RESTRAINT HEAD UNIVERSAL NS (MISCELLANEOUS) ×2 IMPLANT
SCREW SPINAL 40 F/CAGE LRG (Screw) ×1 IMPLANT
SIZER ECLIPSE CAGE SCREW (ORTHOPEDIC DISPOSABLE SUPPLIES) ×1 IMPLANT
SLING ARM FOAM STRAP LRG (SOFTGOODS) ×1 IMPLANT
SLING ARM FOAM STRAP MED (SOFTGOODS) IMPLANT
SMARTMIX MINI TOWER (MISCELLANEOUS) ×2
SPONGE T-LAP 18X18 ~~LOC~~+RFID (SPONGE) IMPLANT
SUCTION FRAZIER HANDLE 12FR (TUBING) ×2
SUCTION TUBE FRAZIER 12FR DISP (TUBING) ×1 IMPLANT
SUT FIBERWIRE #2 38 T-5 BLUE (SUTURE)
SUT MNCRL AB 3-0 PS2 18 (SUTURE) ×2 IMPLANT
SUT MON AB 2-0 CT1 36 (SUTURE) ×2 IMPLANT
SUT VIC AB 1 CT1 36 (SUTURE) ×2 IMPLANT
SUTURE FIBERWR #2 38 T-5 BLUE (SUTURE) IMPLANT
SUTURE TAPE 1.3 40 TPR END (SUTURE) IMPLANT
SUTURETAPE 1.3 40 TPR END (SUTURE)
TOWEL OR 17X26 10 PK STRL BLUE (TOWEL DISPOSABLE) ×2 IMPLANT
TOWEL OR NON WOVEN STRL DISP B (DISPOSABLE) ×2 IMPLANT
TOWER SMARTMIX MINI (MISCELLANEOUS) IMPLANT
WATER STERILE IRR 1000ML POUR (IV SOLUTION) ×4 IMPLANT
YANKAUER SUCT BULB TIP 10FT TU (MISCELLANEOUS) ×2 IMPLANT

## 2020-11-04 NOTE — Evaluation (Signed)
Occupational Therapy Evaluation Patient Details Name: Victor [name] MRN: [id number] DOB: 02-20-1956 Today's Date: 11/04/2020    History of Present Illness Patient s/p rTSA   Clinical Impression   [name] is a 65 year old man s/p shoulder surgery. 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 He also knows how to use the ice cuff and cooler. Patient educated on PROM restrictions, the need to wear the sling at all times and to not push, pull or lift with right arm. Patient demonstrated ability to don clothes and sling. No further OT needs.    Follow Up Recommendations  Follow surgeon's recommendation for DC plan and follow-up therapies    Equipment Recommendations  None recommended by OT    Recommendations for Other Services       Precautions / Restrictions Precautions Precautions: Shoulder Type of Shoulder Precautions: May come out of sling if sitting in controlled environment. ie while watching tv, eating etc to give neck and skin break from sling. Please sleep in sling though until 4 weeks post op.     Ok to use operative arm to assist in feeding, bathing, ADL's.       New PROM restrictions (8/18) for use in hygiene and ADL only   ER 20   ABD 45   FE 60     Pendulums are to be gentle and are the preferred exercise to be instructed for patients to perform at home.( Along with elbow wrist and hand exercise) Shoulder Interventions: Shoulder sling/immobilizer;Off for dressing/bathing/exercises Precaution Booklet Issued:  (handouts provided) Restrictions Weight Bearing Restrictions: Yes RUE Weight Bearing: Non weight bearing      Mobility Bed Mobility                    Transfers                      Balance Overall balance assessment: No apparent balance deficits (not formally assessed)                                         ADL either performed or  assessed with clinical judgement   ADL Overall ADL's : Modified independent                                             Vision Patient Visual Report: No change from baseline       Perception     Praxis      Pertinent Vitals/Pain Pain Assessment: No/denies pain     Hand Dominance     Extremity/Trunk Assessment Upper Extremity Assessment Upper Extremity Assessment: RUE deficits/detail RUE Deficits / Details: impaired by shoulder restrictions and interscalene block   Lower Extremity Assessment Lower Extremity Assessment: Overall WFL for tasks assessed   Cervical / Trunk Assessment Cervical / Trunk Assessment: Normal   Communication     Cognition Arousal/Alertness: Awake/alert Behavior During Therapy: WFL for tasks assessed/performed Overall Cognitive Status: Within Functional Limits for tasks assessed                                     General Comments  Exercises     Shoulder Instructions      Home Living                                          Prior Functioning/Environment                   OT Problem List: Decreased strength;Decreased range of motion;Pain;Impaired UE functional use      OT Treatment/Interventions:      OT Goals(Current goals can be found in the care plan section) Acute Rehab OT Goals OT Goal Formulation: All assessment and education complete, DC therapy  OT Frequency:     Barriers to D/C:            Co-evaluation              AM-PAC OT "6 Clicks" Daily Activity     Outcome Measure Help from another person eating meals?: None Help from another person taking care of personal grooming?: None Help from another person toileting, which includes using toliet, bedpan, or urinal?: None Help from another person bathing (including washing, rinsing, drying)?: None Help from another person to put on and taking off regular upper body clothing?: None Help from another  person to put on and taking off regular lower body clothing?: None 6 Click Score: 24   End of Session Nurse Communication: Other (comment) (Ot education complete)  Activity Tolerance: Patient tolerated treatment well Patient left: in chair;with call bell/phone within reach;with family/visitor present  OT Visit Diagnosis: Pain Pain - Right/Left: Right Pain - part of body: Shoulder                Time: [id number] OT Time Calculation (min): 11 min Charges:  OT General Charges $OT Visit: 1 Visit OT Evaluation $OT Eval Low Complexity: 1 Low  Caidyn Blossom, OTR/L [place]  Office [phone] Pager: [phone]   Lenward Chancellor 11/04/2020, 2:57 PM

## 2020-11-04 NOTE — Anesthesia Procedure Notes (Signed)
Procedure Name: Intubation Date/Time: 11/04/2020 10:14 AM Performed by: Gerald Leitz, CRNA Pre-anesthesia Checklist: Patient identified, Patient being monitored, Timeout performed, Emergency Drugs available and Suction available Patient Re-evaluated:Patient Re-evaluated prior to induction Oxygen Delivery Method: Circle system utilized Preoxygenation: Pre-oxygenation with 100% oxygen Induction Type: IV induction Ventilation: Mask ventilation without difficulty Laryngoscope Size: Mac and 3 Grade View: Grade I Tube type: Oral Tube size: 7.5 mm Number of attempts: 1 Placement Confirmation: ETT inserted through vocal cords under direct vision, positive ETCO2 and breath sounds checked- equal and bilateral Secured at: 23 cm Tube secured with: Tape Dental Injury: Teeth and Oropharynx as per pre-operative assessment

## 2020-11-04 NOTE — Anesthesia Preprocedure Evaluation (Addendum)
Anesthesia Evaluation  Patient identified by MRN, date of birth, ID band Patient awake    Reviewed: Allergy & Precautions, NPO status , Patient's Chart, lab work & pertinent test results, reviewed documented beta blocker date and time   Airway Mallampati: I  TM Distance: >3 FB Neck ROM: Limited    Dental no notable dental hx. (+) Teeth Intact, Caps, Dental Advisory Given   Pulmonary sleep apnea , former smoker,    Pulmonary exam normal breath sounds clear to auscultation       Cardiovascular hypertension, Pt. on medications and Pt. on home beta blockers + Peripheral Vascular Disease  Normal cardiovascular exam Rhythm:Regular Rate:Normal  EKG 05/20/20 NSR, 1st deg AV Block   Neuro/Psych  Headaches, Anxiety Peripheral neuropathy  Neuromuscular disease    GI/Hepatic Neg liver ROS, GERD  Medicated and Controlled,  Endo/Other  Hyperlipidemia Gout Obesity Borderline DM  Renal/GU Renal InsufficiencyRenal disease  negative genitourinary   Musculoskeletal  (+) Arthritis , Osteoarthritis,  Fibromyalgia -OA right shoulder   Abdominal (+) + obese,   Peds  Hematology negative hematology ROS (+)   Anesthesia Other Findings   Reproductive/Obstetrics                            Anesthesia Physical Anesthesia Plan  ASA: 3  Anesthesia Plan: General   Post-op Pain Management:  Regional for Post-op pain   Induction: Intravenous  PONV Risk Score and Plan: 3 and Treatment may vary due to age or medical condition and Ondansetron  Airway Management Planned: Oral ETT and Video Laryngoscope Planned  Additional Equipment:   Intra-op Plan:   Post-operative Plan: Extubation in OR  Informed Consent: I have reviewed the patients History and Physical, chart, labs and discussed the procedure including the risks, benefits and alternatives for the proposed anesthesia with the patient or authorized  representative who has indicated his/her understanding and acceptance.     Dental advisory given  Plan Discussed with: CRNA and Anesthesiologist  Anesthesia Plan Comments: (Use 6.5 ETT)        Anesthesia Quick Evaluation

## 2020-11-04 NOTE — Discharge Instructions (Signed)
 Kevin M. Supple, M.D., F.A.A.O.S. Orthopaedic Surgery Specializing in Arthroscopic and Reconstructive Surgery of the Shoulder [phone] [address] - [place], [place] [address] - Fax [phone]   POST-OP TOTAL SHOULDER REPLACEMENT INSTRUCTIONS  1. Follow up in the office for your first post-op appointment 10-14 days from the date of your surgery. If you do not already have a scheduled appointment, our office will contact you to schedule.  2. The bandage over your incision is waterproof. You may begin showering with this dressing on. You may leave this dressing on until first follow up appointment within 2 weeks. We prefer you leave this dressing in place until follow up however after 5-7 days if you are having itching or skin irritation and would like to remove it you may do so. Go slow and tug at the borders gently to break the bond the dressing has with the skin. At this point if there is no drainage it is okay to go without a bandage or you may cover it with a light guaze and tape. You can also expect significant bruising around your shoulder that will drift down your arm and into your chest wall. This is very normal and should resolve over several days.   3. Wear your sling/immobilizer at all times except to perform the exercises below or to occasionally let your arm dangle by your side to stretch your elbow. You also need to sleep in your sling immobilizer until instructed otherwise. It is ok to remove your sling if you are sitting in a controlled environment and allow your arm to rest in a position of comfort by your side or on your lap with pillows to give your neck and skin a break from the sling. You may remove it to allow arm to dangle by side to shower. If you are up walking around and when you go to sleep at night you need to wear it.  4. Range of motion to your elbow, wrist, and hand are encouraged 3-5 times daily. Exercise to your hand and fingers helps to reduce  swelling you may experience.   5. Prescriptions for a pain medication and a muscle relaxant are provided for you. It is recommended that if you are experiencing pain that you pain medication alone is not controlling, add the muscle relaxant along with the pain medication which can give additional pain relief. The first 1-2 days is generally the most severe of your pain and then should gradually decrease. As your pain lessens it is recommended that you decrease your use of the pain medications to an "as needed basis'" only and to always comply with the recommended dosages of the pain medications.  6. Pain medications can produce constipation along with their use. If you experience this, the use of an over the counter stool softener or laxative daily is recommended.   7. For additional questions or concerns, please do not hesitate to call the office. If after hours there is an answering service to forward your concerns to the physician on call.  8.Pain control following an exparel block  To help control your post-operative pain you received a nerve block  performed with Exparel which is a long acting anesthetic (numbing agent) which can provide pain relief and sensations of numbness (and relief of pain) in the operative shoulder and arm for up to 3 days. Sometimes it provides mixed relief, meaning you may still have numbness in certain areas of the arm but can still be able to   move  parts of that arm, hand, and fingers. We recommend that your prescribed pain medications  be used as needed. We do not feel it is necessary to "pre medicate" and "stay ahead" of pain.  Taking narcotic pain medications when you are not having any pain can lead to unnecessary and potentially dangerous side effects.    9. Use the ice machine as much as possible in the first 5-7 days from surgery, then you can wean its use to as needed. The ice typically needs to be replaced every 6 hours, instead of ice you can actually freeze  water bottles to put in the cooler and then fill water around them to avoid having to purchase ice. You can have spare water bottles freezing to allow you to rotate them once they have melted. Try to have a thin shirt or light cloth or towel under the ice wrap to protect your skin.   FOR ADDITIONAL INFO ON ICE MACHINE AND INSTRUCTIONS GO TO THE WEBSITE AT  https://www.djogl[employer].com/products/donjoy/donjoy-iceman-[email or web address]  10.  We recommend that you avoid any dental work or cleaning in the first 3 months following your joint replacement. This is to help minimize the possibility of infection from the bacteria in your mouth that enters your bloodstream during dental work. We also recommend that you take an antibiotic prior to your dental work for the first year after your shoulder replacement to further help reduce that risk. Please simply contact our office for antibiotics to be sent to your pharmacy prior to dental work.  11. Dental Antibiotics:  In most cases prophylactic antibiotics for Dental procdeures after total joint surgery are not necessary.  Exceptions are as follows:  1. History of prior total joint infection  2. Severely immunocompromised (Organ Transplant, cancer chemotherapy, Rheumatoid biologic meds such as Humera)  3. Poorly controlled diabetes (A1C &gt; 8.0, blood glucose over 200)  If you have one of these conditions, contact your surgeon for an antibiotic prescription, prior to your dental procedure.   POST-OP EXERCISES  Pendulum Exercises  Perform pendulum exercises while standing and bending at the waist. Support your uninvolved arm on a table or chair and allow your operated arm to hang freely. Make sure to do these exercises passively - not using you shoulder muscles. These exercises can be performed once your nerve block effects have worn off.  Repeat 20 times. Do 3 sessions per day.     

## 2020-11-04 NOTE — H&P (Signed)
Victor [name]    Chief Complaint: Right shoulder osteoarthritis HPI: The patient is a 65 y.o. male with chronic and progressively increasing right shoulder pain related to severe osteoarthritis.  Due to his increasing functional limitations and failure to respond to prolonged attempts at conservative management, he is brought to the operating this time for planned right shoulder anatomic arthroplasty.  Past Medical History:  Diagnosis Date   Anxiety    Arthritis    "all over" (04/15/2013)   Back pain, chronic    Chronic neck pain    DDD (degenerative disc disease), cervical    DDD (degenerative disc disease), lumbar    Fibromyalgia    GERD (gastroesophageal reflux disease)    Gout    Headache    High cholesterol    History of bronchitis    Hypertension    Leg cramps    Right greater than left   Melanoma (HCC)    Nose   MRSA infection    Neuropathy of both feet    Peripheral vascular disease (HCC)    Pre-diabetes    Radiculopathy of lumbar region    Sleep apnea    "don't use CPAP" (04/15/2013)    Past Surgical History:  Procedure Laterality Date   ANTERIOR CERVICAL DECOMP/DISCECTOMY FUSION  2000's X3   "had 3 surgeries on my front neck" (04/15/2013)   ANTERIOR LAT LUMBAR FUSION Left 08/06/2017   Procedure: LEFT LUMBAR TWO-THREE ANTERIOR LATERAL LUMBAR FUSION WITH PERCUTANEOUS SCREWS;  Surgeon: Earnie Larsson, MD;  Location: [facility];  Service: Neurosurgery;  Laterality: Left;   APPENDECTOMY  ~ 2010   BACK SURGERY     CARPAL TUNNEL RELEASE Left 1990's   CATARACT EXTRACTION, BILATERAL     COLONOSCOPY     ear drum surgery  1974   EYE SURGERY Right    FLEXIBLE SIGMOIDOSCOPY  12/21/2011   Procedure: FLEXIBLE SIGMOIDOSCOPY;  Surgeon: Lear Ng, MD;  Location: [facility];  Service: Endoscopy;  Laterality: N/A;   HAND TENDON SURGERY Left ~ 1978 X2-1980's X3   "had 5 ORs after laceration"   HOT HEMOSTASIS  12/21/2011   Procedure: HOT HEMOSTASIS (ARGON PLASMA  COAGULATION/BICAP);  Surgeon: Lear Ng, MD;  Location: [facility];  Service: Endoscopy;  Laterality: N/A;   INGUINAL HERNIA REPAIR Left 04/15/2013   Procedure: LAPAROSCOPIC REPAIR OF INCARCERATED LEFT INGUINAL HERNIA;  Surgeon: Gayland Curry, MD;  Location: [facility];  Service: General;  Laterality: Left;   INGUINAL HERNIA REPAIR Bilateral 1970's - [place] Left 04/15/2013   INSERTION OF MESH Left 04/15/2013   Procedure: INSERTION OF MESH;  Surgeon: Gayland Curry, MD;  Location: [facility];  Service: General;  Laterality: Left;   JOINT REPLACEMENT     right knee   KNEE ARTHROSCOPY Right 1990's   LAPAROSCOPY N/A 04/15/2013   Procedure: LAPAROSCOPY DIAGNOSTIC;  Surgeon: Gayland Curry, MD;  Location: [facility];  Service: General;  Laterality: N/A;   LUMBAR LAMINECTOMY/DECOMPRESSION MICRODISCECTOMY Right 07/22/2015   Procedure: Right Lumbar Two-Three Microdiscectomy;  Surgeon: Earnie Larsson, MD;  Location: [facility];  Service: Neurosurgery;  Laterality: Right;   LUMBAR LAMINECTOMY/DECOMPRESSION MICRODISCECTOMY Right 09/05/2016   Procedure: Microdiscectomy - right - T12-L1;  Surgeon: Earnie Larsson, MD;  Location: [facility];  Service: Neurosurgery;  Laterality: Right;   NASAL SEPTUM SURGERY Bilateral 2014   "removed polyps & infection"   POLYPECTOMY  01/10/2011   Procedure: POLYPECTOMY;  Surgeon: Lear Ng, MD;  Location: [facility];  Service: Endoscopy;  Laterality: N/A;   POSTERIOR CERVICAL LAMINECTOMY Left 05/24/2020   Procedure: Laminectomy and Foraminotomy - left - Cervical two-Cervical three;  Surgeon: Earnie Larsson, MD;  Location: [facility];  Service: Neurosurgery;  Laterality: Left;   POSTERIOR LUMBAR FUSION  11/1995   "ray cages" (04/15/2013)   SHOULDER ARTHROSCOPY Bilateral    "twice each"   TONSILLECTOMY  1960's   TOTAL KNEE ARTHROPLASTY Right 02/24/2014   Procedure: TOTAL RIGHT KNEE ARTHROPLASTY;  Surgeon: Mauri Pole, MD;  Location: [facility];  Service:  Orthopedics;  Laterality: Right;   TOTAL SHOULDER ARTHROPLASTY Left 09/18/2019   Procedure: TOTAL SHOULDER ARTHROPLASTY;  Surgeon: Justice Britain, MD;  Location: [facility];  Service: Orthopedics;  Laterality: Left;  161mn   UPPER GI ENDOSCOPY     VASECTOMY  1988   WISDOM TOOTH EXTRACTION  2000's    Family History  Problem Relation Age of Onset   Hypertension Father    Cancer Mother        breast    Heart Problems Mother    Alzheimer's disease Mother    Allergies Daughter    Interstitial cystitis Daughter     Social History:  reports that he quit smoking about 10 years ago. His smoking use included cigarettes and cigars. He has a 1.50 pack-year smoking history. He has never used smokeless tobacco. He reports current alcohol use of about 5.0 standard drinks per week. He reports that he does not use drugs.   Medications Prior to Admission  Medication Sig Dispense Refill   acetaminophen (TYLENOL) 500 MG tablet Take 500 mg by mouth every 8 (eight) hours as needed for moderate pain.     allopurinol (ZYLOPRIM) 300 MG tablet TAKE 1 AND 1/2 TABLETS DAILY BY MOUTH (Patient taking differently: Take 450 mg by mouth daily.) 135 tablet 0   ALPRAZolam (XANAX) 1 MG tablet Take 1 mg by mouth daily as needed for anxiety.     atenolol (TENORMIN) 100 MG tablet Take 100 mg by mouth every morning.     b complex vitamins tablet Take 1 tablet by mouth daily. B150     Biotin 5000 MCG CAPS Take 5,000 mcg by mouth daily.     Calcium Carbonate (CALCIUM 600 PO) Take 600 mg by mouth daily.     carboxymethylcellulose (REFRESH PLUS) 0.5 % SOLN Place 1 drop into both eyes daily as needed (Dry eyes).     colchicine 0.6 MG tablet TAKE 1 TABLET (0.6 MG TOTAL) BY MOUTH 2 (TWO) TIMES DAILY AS NEEDED. (Patient taking differently: Take 0.6 mg by mouth 2 (two) times daily as needed (gout).) 180 tablet 0   cyclobenzaprine (FLEXERIL) 10 MG tablet Take 1 tablet (10 mg total) by mouth 3 (three) times daily as needed for muscle  spasms. 30 tablet 0   fish oil-omega-3 fatty acids 1000 MG capsule Take 1 g by mouth daily.     fluticasone (FLONASE) 50 MCG/ACT nasal spray Place 1 spray into both nostrils 2 (two) times daily. For congestion     GINSENG PO Take 400 mg by mouth daily.     ibuprofen (ADVIL) 200 MG tablet Take 200 mg by mouth every 8 (eight) hours as needed for moderate pain.     magnesium gluconate (MAGONATE) 500 MG tablet Take 500 mg by mouth daily.     Melatonin 10 MG TABS Take 10 mg by mouth at bedtime as needed (sleep).     Methylsulfonylmethane (MSM) 1000 MG CAPS Take 1,000 mg by mouth  daily.     Multiple Vitamins-Minerals (MULTIVITAMIN WITH MINERALS) tablet Take 1 tablet by mouth daily.     neomycin-bacitracin-polymyxin (NEOSPORIN) ointment Apply 1 application topically daily as needed for wound care.     NONFORMULARY OR COMPOUNDED ITEM Apply 1 g topically every evening. Doxepin 5%, Amantadine 3%, Pentoxifylline 3%, Lamotrigine 2%, Sertraline 3%, Gabapentin 6% PCCA Lipoderm Base Cream     olmesartan (BENICAR) 40 MG tablet Take 40 mg by mouth daily.     omeprazole (PRILOSEC) 40 MG capsule Take 40 mg by mouth daily.     Oxycodone HCl 10 MG TABS Take 10-20 mg by mouth daily as needed (pain).     Psyllium (METAMUCIL FIBER PO) Take 1 Scoop by mouth daily.      RAPAFLO 8 MG CAPS capsule Take 8 mg by mouth at bedtime.  3   selenium 200 MCG TABS tablet Take 200 mcg by mouth daily.     sodium chloride (OCEAN) 0.65 % SOLN nasal spray Place 1 spray into both nostrils as needed for congestion.     Turmeric 500 MG CAPS Take 500 mg by mouth daily.     venlafaxine (EFFEXOR-XR) 150 MG 24 hr capsule Take 150 mg by mouth daily with breakfast.     Vitamin D3 (VITAMIN D) 25 MCG tablet Take 1,000 Units by mouth daily.     vitamin E 400 UNIT capsule Take 400 Units by mouth daily.     diazepam (VALIUM) 5 MG tablet Take 1 tablet (5 mg total) by mouth every 8 (eight) hours as needed for muscle spasms. 50 tablet 0   guaiFENesin  (MUCINEX) 600 MG 12 hr tablet Take 600 mg by mouth at bedtime as needed (Congestion).     hydrocortisone cream 1 % Apply 1 application topically daily as needed for itching.     testosterone cypionate (DEPOTESTOTERONE CYPIONATE) 200 MG/ML injection Inject into the muscle every 14 (fourteen) days. 0.75 mL       Physical Exam: Right shoulder demonstrates painful and guarded motion as noted at his recent office visits.  He is neurovascular intact.  Review of plain radiographs confirm severe osteoarthritis with complete obliteration of the joint space, subchondral sclerosis, and peripheral osteophyte formation.  Vitals  Temp:  [98 F (36.7 C)] 98 F (36.7 C) (09/08 0809) Pulse Rate:  [74] 74 (09/08 0809) Resp:  [18] 18 (09/08 0809) BP: (138)/(95) 138/95 (09/08 0809) SpO2:  [98 %] 98 % (09/08 0809) Weight:  [97.5 kg] 97.5 kg (09/08 0758)  Assessment/Plan  Impression: Right shoulder osteoarthritis  Plan of Action: Procedure(s): TOTAL SHOULDER ARTHROPLASTY  Carisha Kantor M Anyra Kaufman 11/04/2020, 9:02 AM Contact # [phone]

## 2020-11-04 NOTE — Progress Notes (Signed)
AssistedDr. Royce Macadamia with right, ultrasound guided, interscalene  block. Side rails up, monitors on throughout procedure. See vital signs in flow sheet. Tolerated Procedure well.

## 2020-11-04 NOTE — Op Note (Signed)
11/04/2020  12:19 PM  PATIENT:   Victor [name]  65 y.o. male  PRE-OPERATIVE DIAGNOSIS: Severe right shoulder osteoarthritis  POST-OPERATIVE DIAGNOSIS: Same  PROCEDURE: Right shoulder anatomic arthroplasty utilizing a size 49 trunnion with a large cage screw, 49 x 20 humeral head, large glenoid  SURGEON:  Demaya Hardge, Metta Clines M.D.  ASSISTANTS: Jenetta Loges, PA-C  ANESTHESIA:   General endotracheal and interscalene block with Exparel  EBL: 150 cc  SPECIMEN: None  Drains: None   PATIENT DISPOSITION:  PACU - hemodynamically stable.    PLAN OF CARE: Discharge to home after PACU  Brief history:  Victor [name] is a 65 year old gentleman with a long history of bilateral shoulder difficulties related to severe osteoarthritis.  We have performed a left shoulder anatomic arthroplasty just over a year ago which he has ultimately done well with.  He is now experienced increasing pain and functional rotations related to severe right shoulder osteoarthritis and is brought to the operating this time for planned right shoulder anatomic arthroplasty.  Preoperatively, I counseled the patient regarding treatment options and risks versus benefits thereof.  Possible surgical complications were all reviewed including potential for bleeding, infection, neurovascular injury, persistent pain, loss of motion, anesthetic complication, failure of the implant, and possible need for additional surgery. They understand and accept and agrees with our planned procedure.   Procedure in detail:  After undergoing routine preop evaluation the patient received prophylactic antibiotics and interscalene block with Exparel was established in the holding area by the anesthesia department.  The patient was subsequently placed supine on the operating table and underwent the smooth induction of a general endotracheal anesthesia.  Placed in the beachchair position and appropriately padded and protected.  Right shoulder  girdle region was sterilely prepped and draped in standard fashion.  Timeout was called.  A deltopectoral approach to the right shoulder is made through a 10 cm incision.  Skin flaps were elevated dissection carried deeply the deltopectoral interval was developed from proximal to this with the vein taken laterally.  Upper centimeter half the pectoralis major tendon was tenotomized for exposure and the conjoined tendon was mobilized and retracted medially.  There been a previous rupture long head biceps tendon.  The bicipital groove was unroofed and the rotator cuff was then split along the rotator interval to the base of the coracoid and the subscapularis insertions were identified superiorly and inferiorly and an oscillating saw was then used to perform a lesser tuberosity osteotomy.  Of note we found that the upper half of the subscap tendon had separated from the majority of the lesser tuberosity and a bone fragment was removed.  We then tagged the margin of the subscapularis and it was then reflected medially and we divided the capsular attachments from the anterior and infra margins of the humeral neck allowing delivery of the humeral head through the wound.  Extra medullary guide was then used to outline a proposed humeral head resection which was performed with an oscillating saw at approximate 30 degrees retroversion.  Rondure was then used to remove the multiple large osteophytes on the humeral neck.  The humeral metaphysis was then sized at a 49.  It was prepared with the central coring reamer and measured for a large cage screw.  A metal cap was then placed over the cut proximal humeral surface.  This point we then exposed the glenoid with appropriate retractors and performed a circumferential labral resection gaining complete visualization the periphery of the glenoid.  We did  identify some modest retroversion and this was taken into account as we placed our central guidepin for a large glenoid to  perform an approximate 10 degree correction.  The glenoid was then reamed to a stable subchondral bony bed in preparation completed with the central drill and the superior and inferior peg and slot respectively and the glenoid was then broached Nephrox with excellent fit.  At this point the glenoid was cleaned and dried and cement was mixed introduced to the superior and inferior peg and slot respectively and morselized bone graft was placed about the central peg of the glenoid implant and the implant was then impacted with excellent fit and fixation and good stability.  This point we then returned our attention back to the humeral metaphysis where the trunnion was applied with a large cage screw centrally and this was impacted and our cage screw was packed with bone graft and the cage screw was then seated with achieving excellent fit and fixation.  We then performed a series of trial reductions and ultimately felt that the 49 x 20 humeral head gave [place] the best motion stability and soft tissue balance with approximately 50% translation of the glenoid and good soft tissue tension and centering the head nicely.  At this point the trial was then removed.  We placed a suture tape through the eyelid on the collar of the trunnion and then 2 additional medial row anchors using standard technique.  We then cleaned the implant and applied the 49 x 20 head and once this was impacted we then performed our final reduction and this again showed good motion good stability good soft tissue balance.  This point we then confirmed that the subscapularis had a good elasticity.  We then passed our 3 medial row suture limbs through the bone tendon junction of the subscapularis and then passed the suture limbs in an alternating fashion into 2 Lateral Row anchors creating a double row repair which allowed excellent apposition of the subscapularis tendon and the small wafer of bone onto the donor site of the humeral metaphysis with good  stability achieved and the arm easily achieved 30 degrees of external rotation without excessive tension on the subscap repair.  We then placed a pair of figure-of-eight suture tape sutures through the rotator interval the overall construct was much to our satisfaction with good mobility good soft tissue balance and good stability.  The wounds were copes irrigated.  Final hemostasis was obtained.  Vancomycin powder was spread liberally throughout the deep soft tissue layers.  The deltopectoral interval was reapproximated with a series of figure-of-eight and 1 Vicryl sutures.  2-0 Monocryl used to the subcu layer and intracuticular 3-0 Monocryl for the skin followed by Dermabond and Aquacel dressing.  The right arm was then placed into a sling.  The patient was then awakened, extubated, and taken to the recovery room in stable condition.  Jenetta Loges, PA-C was utilized as an Environmental consultant throughout this case, essential for help with positioning the patient, positioning extremity, tissue manipulation, implantation of the prosthesis, suture management, wound closure, and intraoperative decision-making.  Marin Shutter MD   Contact # [phone]

## 2020-11-04 NOTE — Transfer of Care (Signed)
Immediate Anesthesia Transfer of Care Note  Patient: Victor [name]  Procedure(s) Performed: Procedure(s) with comments: TOTAL SHOULDER ARTHROPLASTY (Right) - 136mn  Patient Location: PACU  Anesthesia Type:General  Level of Consciousness: Alert, Awake, Oriented  Airway & Oxygen Therapy: Patient Spontanous Breathing  Post-op Assessment: Report given to RN  Post vital signs: Reviewed and stable  Last Vitals:  Vitals:   11/04/20 0909 11/04/20 0914  BP: (!) 132/97 133/90  Pulse: 60 63  Resp: 12 16  Temp:    SpO2: [id number]   Complications: No apparent anesthesia complications

## 2020-11-04 NOTE — Anesthesia Procedure Notes (Signed)
Anesthesia Regional Block: Interscalene brachial plexus block   Pre-Anesthetic Checklist: , timeout performed,  Correct Patient, Correct Site, Correct Laterality,  Correct Procedure, Correct Position, site marked,  Risks and benefits discussed,  Surgical consent,  Pre-op evaluation,  At surgeon's request and post-op pain management  Laterality: Right  Prep: chloraprep       Needles:  Injection technique: Single-shot  Needle Type: Echogenic Stimulator Needle     Needle Length: 10cm  Needle Gauge: 21   Needle insertion depth: 6 cm   Additional Needles:     Motor weakness within 5 minutes.  Narrative:  Start time: 11/04/2020 9:04 AM End time: 11/04/2020 9:09 AM Injection made incrementally with aspirations every 5 mL.  Performed by: Personally  Anesthesiologist: Josephine Igo, MD  Additional Notes: Timeout performed. Patient sedated. Relevant anatomy ID'd using [place]. Incremental 2-36m injection of LA with frequent aspiration. Patient tolerated procedure well.    Right Interscalene Block

## 2020-11-04 NOTE — Anesthesia Postprocedure Evaluation (Signed)
Anesthesia Post Note  Patient: Victor [name]  Procedure(s) Performed: TOTAL SHOULDER ARTHROPLASTY (Right: Shoulder)     Patient location during evaluation: PACU Anesthesia Type: General Level of consciousness: awake and alert and oriented Pain management: pain level controlled Vital Signs Assessment: post-procedure vital signs reviewed and stable Respiratory status: spontaneous breathing, nonlabored ventilation and respiratory function stable Cardiovascular status: blood pressure returned to baseline and stable Postop Assessment: no apparent nausea or vomiting Anesthetic complications: no   No notable events documented.  Last Vitals:  Vitals:   11/04/20 1245 11/04/20 1300  BP: 127/77 112/75  Pulse: 66 66  Resp: 13 12  Temp:  36.5 C  SpO2: 100% 95%    Last Pain:  Vitals:   11/04/20 1300  TempSrc:   PainSc: 0-No pain                 Toney Lizaola A.

## 2020-11-10 ENCOUNTER — Encounter (HOSPITAL_COMMUNITY): Payer: Self-pay | Admitting: Orthopedic Surgery

## 2020-11-10 NOTE — Progress Notes (Signed)
Office Visit Note  Patient: Victor [name]             Date of Birth: 09/10/1955           MRN: [id number]             PCP: Antony Contras, MD Referring: Antony Contras, MD Visit Date: 11/24/2020 Occupation: '@GUAROCC'$ @  Subjective:  Pain in multiple joints.   History of Present Illness: [name] is a 65 y.o. male with a history of gout and osteoarthritis.  He denies having any gout flare.  He has been taking allopurinol on a regular basis.  He underwent right total shoulder replacement on November 04, 2020 by Dr. Onnie Graham.  His right arm is in sling.  He has pain and discomfort in his bilateral hands.  His left total shoulder replacement is doing well.  He continues to have some pain and stiffness in his hands.  He has chronic lower back pain.  He states he has had cortisone injections in his back in the past.  He continues to have some stiffness in his knee joints.  He denies any joint swelling.  Activities of Daily Living:  Patient reports morning stiffness for 1 hour.   Patient Reports nocturnal pain.  Difficulty dressing/grooming: Reports Difficulty climbing stairs: Denies Difficulty getting out of chair: Reports Difficulty using hands for taps, buttons, cutlery, and/or writing: Reports  Review of Systems  Constitutional:  Negative for fatigue.  HENT:  Positive for mouth dryness. Negative for mouth sores and nose dryness.   Eyes:  Positive for dryness. Negative for pain and itching.  Respiratory:  Negative for shortness of breath and difficulty breathing.   Cardiovascular:  Negative for chest pain and palpitations.  Gastrointestinal:  Negative for blood in stool, constipation and diarrhea.  Endocrine: Negative for increased urination.  Genitourinary:  Negative for difficulty urinating.  Musculoskeletal:  Positive for joint pain, joint pain, joint swelling, myalgias, morning stiffness, muscle tenderness and myalgias.  Skin:  Negative for color change, rash,  redness and sensitivity to sunlight.  Allergic/Immunologic: Negative for susceptible to infections.  Neurological:  Positive for numbness and headaches. Negative for dizziness, memory loss and weakness.  Hematological:  Positive for bruising/bleeding tendency. Negative for swollen glands.  Psychiatric/Behavioral:  Positive for sleep disturbance. Negative for confusion.    PMFS History:  Patient Active Problem List   Diagnosis Date Noted   Cervical radiculopathy 05/24/2020   Hypertension    Neuropathy of both feet    Lumbar foraminal stenosis 08/06/2017   HNP (herniated nucleus pulposus), thoracic 09/05/2016   Chronic kidney disease (CKD) 04/26/2016   DJD (degenerative joint disease), cervical 04/26/2016   Essential hypertension 04/26/2016   Dyslipidemia 04/26/2016   Sleep apnea 04/26/2016   Fibromyalgia 04/18/2016   Idiopathic chronic gout of multiple sites without tophus 04/18/2016   Spondylosis of lumbar region without myelopathy or radiculopathy 04/18/2016   Primary osteoarthritis of both knees 04/18/2016   Lumbar herniated disc 07/22/2015   Morbid obesity ([place]) 02/25/2014   S/P right TKA 02/24/2014   Heartburn 01/10/2011    Past Medical History:  Diagnosis Date   Anxiety    Arthritis    "all over" (04/15/2013)   Back pain, chronic    Chronic neck pain    DDD (degenerative disc disease), cervical    DDD (degenerative disc disease), lumbar    Fibromyalgia    GERD (gastroesophageal reflux disease)    Gout    Headache    High cholesterol  History of bronchitis    Hypertension    Leg cramps    Right greater than left   Melanoma (HCC)    Nose   MRSA infection    Neuropathy of both feet    Peripheral vascular disease (HCC)    Pre-diabetes    Radiculopathy of lumbar region    Sleep apnea    "don't use CPAP" (04/15/2013)    Family History  Problem Relation Age of Onset   Hypertension Father    Cancer Mother        breast    Heart Problems Mother    Alzheimer's  disease Mother    Allergies Daughter    Interstitial cystitis Daughter    Past Surgical History:  Procedure Laterality Date   ANTERIOR CERVICAL DECOMP/DISCECTOMY FUSION  2000's X3   "had 3 surgeries on my front neck" (04/15/2013)   ANTERIOR LAT LUMBAR FUSION Left 08/06/2017   Procedure: LEFT LUMBAR TWO-THREE ANTERIOR LATERAL LUMBAR FUSION WITH PERCUTANEOUS SCREWS;  Surgeon: Earnie Larsson, MD;  Location: [facility];  Service: Neurosurgery;  Laterality: Left;   APPENDECTOMY  ~ 2010   BACK SURGERY     CARPAL TUNNEL RELEASE Left 1990's   CATARACT EXTRACTION, BILATERAL     COLONOSCOPY     ear drum surgery  1974   EYE SURGERY Right    FLEXIBLE SIGMOIDOSCOPY  12/21/2011   Procedure: FLEXIBLE SIGMOIDOSCOPY;  Surgeon: Lear Ng, MD;  [facility]: [facility];  Service: Endoscopy;  Laterality: N/A;   HAND TENDON SURGERY Left ~ 1978 X2-1980's X3   "had 5 ORs after laceration"   HOT HEMOSTASIS  12/21/2011   Procedure: HOT HEMOSTASIS (ARGON PLASMA COAGULATION/BICAP);  Surgeon: Lear Ng, MD;  Location: [facility];  Service: Endoscopy;  Laterality: N/A;   INGUINAL HERNIA REPAIR Left 04/15/2013   Procedure: LAPAROSCOPIC REPAIR OF INCARCERATED LEFT INGUINAL HERNIA;  Surgeon: Gayland Curry, MD;  Location: [facility];  Service: General;  Laterality: Left;   INGUINAL HERNIA REPAIR Bilateral 1970's - [place] Left 04/15/2013   INSERTION OF MESH Left 04/15/2013   Procedure: INSERTION OF MESH;  Surgeon: Gayland Curry, MD;  Location: [facility];  Service: General;  Laterality: Left;   JOINT REPLACEMENT     right knee   KNEE ARTHROSCOPY Right 1990's   LAPAROSCOPY N/A 04/15/2013   Procedure: LAPAROSCOPY DIAGNOSTIC;  Surgeon: Gayland Curry, MD;  Location: [facility];  Service: General;  Laterality: N/A;   LUMBAR LAMINECTOMY/DECOMPRESSION MICRODISCECTOMY Right 07/22/2015   Procedure: Right Lumbar Two-Three Microdiscectomy;  Surgeon: Earnie Larsson, MD;  Location: [facility];  Service:  Neurosurgery;  Laterality: Right;   LUMBAR LAMINECTOMY/DECOMPRESSION MICRODISCECTOMY Right 09/05/2016   Procedure: Microdiscectomy - right - T12-L1;  Surgeon: Earnie Larsson, MD;  Location: [facility];  Service: Neurosurgery;  Laterality: Right;   NASAL SEPTUM SURGERY Bilateral 2014   "removed polyps & infection"   POLYPECTOMY  01/10/2011   Procedure: POLYPECTOMY;  Surgeon: Lear Ng, MD;  [facility];  Service: Endoscopy;  Laterality: N/A;   POSTERIOR CERVICAL LAMINECTOMY Left 05/24/2020   Procedure: Laminectomy and Foraminotomy - left - Cervical two-Cervical three;  Surgeon: Earnie Larsson, MD;  Location: [facility];  Service: Neurosurgery;  Laterality: Left;   POSTERIOR LUMBAR FUSION  11/1995   "ray cages" (04/15/2013)   SHOULDER ARTHROSCOPY Bilateral    "twice each"   TONSILLECTOMY  1960's   TOTAL KNEE ARTHROPLASTY Right 02/24/2014   Procedure: TOTAL RIGHT KNEE ARTHROPLASTY;  Surgeon: Rodman Key  Marian Sorrow, MD;  [facility]: [facility];  Service: Orthopedics;  Laterality: Right;   TOTAL SHOULDER ARTHROPLASTY Left 09/18/2019   Procedure: TOTAL SHOULDER ARTHROPLASTY;  Surgeon: Justice Britain, MD;  Location: [facility];  Service: Orthopedics;  Laterality: Left;  162mn   TOTAL SHOULDER ARTHROPLASTY Right 11/04/2020   Procedure: TOTAL SHOULDER ARTHROPLASTY;  Surgeon: SJustice Britain MD;  [facility];  Service: Orthopedics;  Laterality: Right;  1247m   UPPER GI ENDOSCOPY     VASECTOMY  1988   WISDOM TOOTH EXTRACTION  2000's   Social History   Social History Narrative   Not on file   Immunization History  Administered Date(s) Administered   Influenza Whole 11/08/2011   Influenza-Unspecified 02/27/2013   PFIZER(Purple Top)SARS-COV-2 Vaccination 06/23/2019, 07/14/2019, 09/22/2020     Objective: Vital Signs: BP (!) 171/111 (BP Location: Left Arm, Patient Position: Sitting, Cuff Size: Normal)   Pulse 65   Ht 5' 7.5" (1.715 m)   Wt 216 lb 3.2 oz (98.1 kg)   BMI 33.36 kg/m    Physical  Exam Vitals and nursing note reviewed.  Constitutional:      Appearance: He is well-developed.  HENT:     Head: Normocephalic and atraumatic.  Eyes:     Conjunctiva/sclera: Conjunctivae normal.     Pupils: Pupils are equal, round, and reactive to light.  Cardiovascular:     Rate and Rhythm: Normal rate and regular rhythm.     Heart sounds: Normal heart sounds.  Pulmonary:     Effort: Pulmonary effort is normal.     Breath sounds: Normal breath sounds.  Abdominal:     General: Bowel sounds are normal.     Palpations: Abdomen is soft.  Musculoskeletal:     Cervical back: Normal range of motion and neck supple.  Skin:    General: Skin is warm and dry.     Capillary Refill: Capillary refill takes less than 2 seconds.  Neurological:     Mental Status: He is alert and oriented to person, place, and time.  Psychiatric:        Behavior: Behavior normal.     Musculoskeletal Exam: He had limited range of motion of the cervical spine and lumbar spine.  Right arm is in a sling due to recent shoulder surgery.  Left shoulder joint was in full range of motion without discomfort.  He had bilateral PIP and DIP thickening with no synovitis.  He also had right third MCP thickening with no synovitis.  Hip joints with good range of motion.  Knee joints with good range of motion without any warmth swelling or effusion.  Right knee joint has been replaced.  He had no tenderness over ankles or MTPs.  CDAI Exam: CDAI Score: -- Patient Global: --; Provider Global: -- Swollen: --; Tender: -- Joint Exam 11/24/2020   No joint exam has been documented for this visit   There is currently no information documented on the homunculus. Go to the Rheumatology activity and complete the homunculus joint exam.  Investigation: No additional findings.  Imaging: No results found.  Recent Labs: Lab Results  Component Value Date   WBC 6.5 10/20/2020   HGB 17.4 (H) 10/20/2020   PLT 194 10/20/2020   NA 137  10/20/2020   K 4.2 10/20/2020   CL 102 10/20/2020   CO2 25 10/20/2020   GLUCOSE 117 (H) 10/20/2020   BUN 16 10/20/2020   CREATININE 1.27 (H) 10/20/2020   BILITOT 1.2 05/20/2020   ALKPHOS 46 05/20/2020  AST 38 05/20/2020   ALT 32 05/20/2020   PROT 7.2 05/20/2020   ALBUMIN 4.0 05/20/2020   CALCIUM 9.7 10/20/2020   GFRAA >60 09/09/2019    September 28, 2020 CMP showed potassium 5.6, AST 43, ALT 31, creatinine 1.09, GFR 75 LDL 155, hemoglobin A1c 5.5  November 04, 2019 uric acid 4.8  Speciality Comments: No specialty comments available.  Procedures:  No procedures performed Allergies: Tetracyclines & related and Niacin and related   Assessment / Plan:     Visit Diagnoses: Idiopathic chronic gout of multiple sites without tophus - allopurinol 450 mg po daily and colchicine 0.6 mg 1 tablet by mouth BID as needed. -He has been on allopurinol on a regular basis.  He has not had a gout flare.  He has not taken colchicine in a long time as his gout has been well controlled.  We will check uric acid and CBC today.  He had recent labs done by his PCP which showed CMP was normal except for mild elevation of LFTs.  Plan: Uric acid  Status post shoulder replacement, right - November 04, 2020-Dr. Supple.  His right arm was in a sling today.  Status post shoulder replacement, left - July 22,2021 by Dr. Onnie Graham.  He had good range of motion of his left shoulder joint.  Primary osteoarthritis of both hands-he has severe osteoarthritis in his hands with PIP and DIP thickening.  He also had right third MCP thickening with no synovitis.  Joint protection muscle strengthening was discussed.  Primary osteoarthritis of left knee-he denies any discomfort today.  History of total right knee replacement (TKR)-he had good range of motion without any warmth swelling or effusion.  Primary osteoarthritis of both feet-he complains of off-and-on discomfort but no swelling.  DDD (degenerative disc disease),  cervical-he has limited range of motion of the cervical spine.  Spondylosis of lumbar region without myelopathy or radiculopathy - he is followed by neurosurgery.  Fibromyalgia-he continues to have some generalized pain and discomfort.  History of hypertension-blood pressure was very elevated today.  He states he has been in a lot of discomfort.  I advised him to monitor blood pressure closely and follow-up with his PCP.  History of insomnia  History of hiatal hernia  History of hypercholesterolemia  History of gastroesophageal reflux (GERD)  History of sleep apnea  Medication management - Plan: CBC with Differential/Platelet  Orders: Orders Placed This Encounter  Procedures   CBC with Differential/Platelet   Uric acid    No orders of the defined types were placed in this encounter.    Follow-Up Instructions: Return in about 6 months (around 05/24/2021) for Gout, Osteoarthritis.   Bo Merino, MD  Note - This record has been created using Editor, commissioning.  Chart creation errors have been sought, but may not always  have been located. Such creation errors do not reflect on  the standard of medical care.

## 2020-11-15 DIAGNOSIS — Z96611 Presence of right artificial shoulder joint: Secondary | ICD-10-CM | POA: Diagnosis not present

## 2020-11-15 DIAGNOSIS — Z471 Aftercare following joint replacement surgery: Secondary | ICD-10-CM | POA: Diagnosis not present

## 2020-11-24 ENCOUNTER — Encounter: Payer: Self-pay | Admitting: Rheumatology

## 2020-11-24 ENCOUNTER — Other Ambulatory Visit: Payer: Self-pay

## 2020-11-24 ENCOUNTER — Ambulatory Visit: Payer: Medicare Other | Admitting: Rheumatology

## 2020-11-24 VITALS — BP 171/111 | HR 65 | Ht 67.5 in | Wt 216.2 lb

## 2020-11-24 DIAGNOSIS — M1712 Unilateral primary osteoarthritis, left knee: Secondary | ICD-10-CM | POA: Diagnosis not present

## 2020-11-24 DIAGNOSIS — Z8719 Personal history of other diseases of the digestive system: Secondary | ICD-10-CM

## 2020-11-24 DIAGNOSIS — Z96611 Presence of right artificial shoulder joint: Secondary | ICD-10-CM

## 2020-11-24 DIAGNOSIS — M47816 Spondylosis without myelopathy or radiculopathy, lumbar region: Secondary | ICD-10-CM

## 2020-11-24 DIAGNOSIS — Z8679 Personal history of other diseases of the circulatory system: Secondary | ICD-10-CM

## 2020-11-24 DIAGNOSIS — M797 Fibromyalgia: Secondary | ICD-10-CM

## 2020-11-24 DIAGNOSIS — Z79899 Other long term (current) drug therapy: Secondary | ICD-10-CM | POA: Diagnosis not present

## 2020-11-24 DIAGNOSIS — M19041 Primary osteoarthritis, right hand: Secondary | ICD-10-CM | POA: Diagnosis not present

## 2020-11-24 DIAGNOSIS — M5030 Other cervical disc degeneration, unspecified cervical region: Secondary | ICD-10-CM

## 2020-11-24 DIAGNOSIS — Z96651 Presence of right artificial knee joint: Secondary | ICD-10-CM | POA: Diagnosis not present

## 2020-11-24 DIAGNOSIS — Z8639 Personal history of other endocrine, nutritional and metabolic disease: Secondary | ICD-10-CM

## 2020-11-24 DIAGNOSIS — Z87898 Personal history of other specified conditions: Secondary | ICD-10-CM | POA: Diagnosis not present

## 2020-11-24 DIAGNOSIS — Z8669 Personal history of other diseases of the nervous system and sense organs: Secondary | ICD-10-CM

## 2020-11-24 DIAGNOSIS — M1A09X0 Idiopathic chronic gout, multiple sites, without tophus (tophi): Secondary | ICD-10-CM

## 2020-11-24 DIAGNOSIS — Z96612 Presence of left artificial shoulder joint: Secondary | ICD-10-CM

## 2020-11-24 DIAGNOSIS — M19071 Primary osteoarthritis, right ankle and foot: Secondary | ICD-10-CM | POA: Diagnosis not present

## 2020-11-24 DIAGNOSIS — M19072 Primary osteoarthritis, left ankle and foot: Secondary | ICD-10-CM

## 2020-11-24 DIAGNOSIS — M19042 Primary osteoarthritis, left hand: Secondary | ICD-10-CM

## 2020-11-25 LAB — CBC WITH DIFFERENTIAL/PLATELET
Absolute Monocytes: 552 cells/uL (ref 200–950)
Basophils Absolute: 19 cells/uL (ref 0–200)
Basophils Relative: 0.3 %
Eosinophils Absolute: 143 cells/uL (ref 15–500)
Eosinophils Relative: 2.3 %
HCT: 49.1 % (ref 38.5–50.0)
Hemoglobin: 16.4 g/dL (ref 13.2–17.1)
Lymphs Abs: 1252 cells/uL (ref 850–3900)
MCH: 32.2 pg (ref 27.0–33.0)
MCHC: 33.4 g/dL (ref 32.0–36.0)
MCV: 96.5 fL (ref 80.0–100.0)
MPV: 10.1 fL (ref 7.5–12.5)
Monocytes Relative: 8.9 %
Neutro Abs: 4235 cells/uL (ref 1500–7800)
Neutrophils Relative %: 68.3 %
Platelets: 275 10*3/uL (ref 140–400)
RBC: 5.09 10*6/uL (ref 4.20–5.80)
RDW: 12.6 % (ref 11.0–15.0)
Total Lymphocyte: 20.2 %
WBC: 6.2 10*3/uL (ref 3.8–10.8)

## 2020-11-25 LAB — URIC ACID: Uric Acid, Serum: 4.4 mg/dL (ref 4.0–8.0)

## 2020-11-29 DIAGNOSIS — M5416 Radiculopathy, lumbar region: Secondary | ICD-10-CM | POA: Diagnosis not present

## 2020-11-29 DIAGNOSIS — R948 Abnormal results of function studies of other organs and systems: Secondary | ICD-10-CM | POA: Diagnosis not present

## 2020-11-30 DIAGNOSIS — M25511 Pain in right shoulder: Secondary | ICD-10-CM | POA: Diagnosis not present

## 2020-12-06 DIAGNOSIS — R3912 Poor urinary stream: Secondary | ICD-10-CM | POA: Diagnosis not present

## 2020-12-07 DIAGNOSIS — M25511 Pain in right shoulder: Secondary | ICD-10-CM | POA: Diagnosis not present

## 2020-12-11 ENCOUNTER — Other Ambulatory Visit: Payer: Self-pay | Admitting: Physician Assistant

## 2020-12-13 DIAGNOSIS — Z96611 Presence of right artificial shoulder joint: Secondary | ICD-10-CM | POA: Diagnosis not present

## 2020-12-13 NOTE — Telephone Encounter (Signed)
Next Visit: 05/25/2021  Last Visit: 11/24/2020  Last Fill: 09/14/2020  DX: Idiopathic chronic gout of multiple sites without tophus   Current Dose per office note 11/24/2020: allopurinol 450 mg po daily  Labs: 11/24/2020 September 28, 2020 CMP showed potassium 5.6, AST 43, ALT 31, creatinine 1.09, GFR 75 LDL 155, hemoglobin A1c 5.5  Okay to refill Allopurinol?

## 2020-12-15 DIAGNOSIS — M25511 Pain in right shoulder: Secondary | ICD-10-CM | POA: Diagnosis not present

## 2020-12-22 DIAGNOSIS — M25511 Pain in right shoulder: Secondary | ICD-10-CM | POA: Diagnosis not present

## 2020-12-30 DIAGNOSIS — M5416 Radiculopathy, lumbar region: Secondary | ICD-10-CM | POA: Diagnosis not present

## 2021-01-24 DIAGNOSIS — Z96611 Presence of right artificial shoulder joint: Secondary | ICD-10-CM | POA: Diagnosis not present

## 2021-02-27 DIAGNOSIS — I2510 Atherosclerotic heart disease of native coronary artery without angina pectoris: Secondary | ICD-10-CM

## 2021-02-27 HISTORY — DX: Atherosclerotic heart disease of native coronary artery without angina pectoris: I25.10

## 2021-03-11 ENCOUNTER — Other Ambulatory Visit: Payer: Self-pay | Admitting: Physician Assistant

## 2021-03-11 NOTE — Telephone Encounter (Signed)
Next Visit: 05/25/2021  Last Visit: 11/24/2020  Last Fill: 12/13/2020  DX: Idiopathic chronic gout of multiple sites without tophus   Current Dose per office note on 11/24/2020: allopurinol 450 mg po daily   Labs: 11/24/2020 CBC WNL.  Uric acid is within the desirable range.   Okay to refill allopurinol?

## 2021-03-31 ENCOUNTER — Other Ambulatory Visit: Payer: Self-pay | Admitting: Student

## 2021-03-31 DIAGNOSIS — M5416 Radiculopathy, lumbar region: Secondary | ICD-10-CM

## 2021-03-31 DIAGNOSIS — R7303 Prediabetes: Secondary | ICD-10-CM | POA: Diagnosis not present

## 2021-03-31 DIAGNOSIS — M109 Gout, unspecified: Secondary | ICD-10-CM | POA: Diagnosis not present

## 2021-03-31 DIAGNOSIS — G4700 Insomnia, unspecified: Secondary | ICD-10-CM | POA: Diagnosis not present

## 2021-03-31 DIAGNOSIS — D582 Other hemoglobinopathies: Secondary | ICD-10-CM | POA: Diagnosis not present

## 2021-03-31 DIAGNOSIS — J019 Acute sinusitis, unspecified: Secondary | ICD-10-CM | POA: Diagnosis not present

## 2021-03-31 DIAGNOSIS — M797 Fibromyalgia: Secondary | ICD-10-CM | POA: Diagnosis not present

## 2021-03-31 DIAGNOSIS — M542 Cervicalgia: Secondary | ICD-10-CM | POA: Diagnosis not present

## 2021-03-31 DIAGNOSIS — K219 Gastro-esophageal reflux disease without esophagitis: Secondary | ICD-10-CM | POA: Diagnosis not present

## 2021-03-31 DIAGNOSIS — J309 Allergic rhinitis, unspecified: Secondary | ICD-10-CM | POA: Diagnosis not present

## 2021-03-31 DIAGNOSIS — I10 Essential (primary) hypertension: Secondary | ICD-10-CM | POA: Diagnosis not present

## 2021-03-31 DIAGNOSIS — E782 Mixed hyperlipidemia: Secondary | ICD-10-CM | POA: Diagnosis not present

## 2021-04-16 ENCOUNTER — Ambulatory Visit
Admission: RE | Admit: 2021-04-16 | Discharge: 2021-04-16 | Disposition: A | Discharge status: Home / Self Care | Payer: Medicare Other | Source: Ambulatory Visit | Attending: Student | Admitting: Student

## 2021-04-16 ENCOUNTER — Other Ambulatory Visit: Payer: Self-pay

## 2021-04-16 DIAGNOSIS — M48061 Spinal stenosis, lumbar region without neurogenic claudication: Secondary | ICD-10-CM | POA: Diagnosis not present

## 2021-04-16 DIAGNOSIS — M545 Low back pain, unspecified: Secondary | ICD-10-CM | POA: Diagnosis not present

## 2021-04-16 DIAGNOSIS — M5416 Radiculopathy, lumbar region: Secondary | ICD-10-CM

## 2021-04-23 ENCOUNTER — Other Ambulatory Visit: Payer: Medicare Other

## 2021-04-26 DIAGNOSIS — I10 Essential (primary) hypertension: Secondary | ICD-10-CM | POA: Diagnosis not present

## 2021-04-26 DIAGNOSIS — M47812 Spondylosis without myelopathy or radiculopathy, cervical region: Secondary | ICD-10-CM | POA: Diagnosis not present

## 2021-04-26 DIAGNOSIS — M5416 Radiculopathy, lumbar region: Secondary | ICD-10-CM | POA: Diagnosis not present

## 2021-04-26 DIAGNOSIS — G894 Chronic pain syndrome: Secondary | ICD-10-CM | POA: Diagnosis not present

## 2021-04-28 DIAGNOSIS — I10 Essential (primary) hypertension: Secondary | ICD-10-CM | POA: Diagnosis not present

## 2021-04-28 DIAGNOSIS — M4310 Spondylolisthesis, site unspecified: Secondary | ICD-10-CM | POA: Diagnosis not present

## 2021-05-04 ENCOUNTER — Other Ambulatory Visit: Payer: Self-pay | Admitting: Neurosurgery

## 2021-05-10 DIAGNOSIS — K5730 Diverticulosis of large intestine without perforation or abscess without bleeding: Secondary | ICD-10-CM | POA: Diagnosis not present

## 2021-05-10 DIAGNOSIS — D175 Benign lipomatous neoplasm of intra-abdominal organs: Secondary | ICD-10-CM | POA: Diagnosis not present

## 2021-05-10 DIAGNOSIS — D124 Benign neoplasm of descending colon: Secondary | ICD-10-CM | POA: Diagnosis not present

## 2021-05-10 DIAGNOSIS — Z86010 Personal history of colonic polyps: Secondary | ICD-10-CM | POA: Diagnosis not present

## 2021-05-10 DIAGNOSIS — K648 Other hemorrhoids: Secondary | ICD-10-CM | POA: Diagnosis not present

## 2021-05-11 DIAGNOSIS — L821 Other seborrheic keratosis: Secondary | ICD-10-CM | POA: Diagnosis not present

## 2021-05-11 DIAGNOSIS — L814 Other melanin hyperpigmentation: Secondary | ICD-10-CM | POA: Diagnosis not present

## 2021-05-11 DIAGNOSIS — D225 Melanocytic nevi of trunk: Secondary | ICD-10-CM | POA: Diagnosis not present

## 2021-05-12 DIAGNOSIS — D124 Benign neoplasm of descending colon: Secondary | ICD-10-CM | POA: Diagnosis not present

## 2021-05-12 NOTE — Progress Notes (Signed)
? ?Office Visit Note ? ?Patient: Victor [name]             ?Date of Birth: 07/08/1955           ?MRN: [id number]             ?PCP: Antony Contras, MD ?Referring: Antony Contras, MD ?Visit Date: 05/25/2021 ?Occupation: '@GUAROCC'$ @ ? ?Subjective:  ?Gout (Back pain, bil shoulder pain, left knee pain) ? ? ?History of Present Illness: Victor [name] is a 66 y.o. male with history of gout, osteoarthritis and degenerative disc disease.  He states he has not had any gout flares since the last visit.  He took colchicine couple of times when he felt a [place] twinges.  He has been taking allopurinol 450 mg p.o. daily and colchicine on as needed basis.  He continues to have some stiffness and discomfort in his shoulders and his hands.  He has ongoing pain and discomfort in his lower back and radiculopathy into his feet.  He is a scheduled to have surgery April 4 for fusion from L4-S1 by Dr. Trenton Gammon.  He will be getting preop labs today.  He continues to have neck pain with left-sided radiculopathy.  He states he also had labs by his PCP last month which were within normal limits except for hyperlipidemia.  He has been experiencing some discomfort in his left knee joint.  The right knee joint is replaced and is doing well.  Fibromyalgia symptoms flare off and on. ? ?Activities of Daily Living:  ?Patient reports morning stiffness for 30 minutes.   ?Patient Reports nocturnal pain.  ?Difficulty dressing/grooming: Reports ?Difficulty climbing stairs: Reports ?Difficulty getting out of chair: Reports ?Difficulty using hands for taps, buttons, cutlery, and/or writing: Reports ? ?Review of Systems  ?Constitutional:  Negative for fatigue.  ?HENT:  Positive for mouth dryness.   ?Eyes:  Positive for dryness.  ?Respiratory:  Negative for shortness of breath.   ?Cardiovascular:  Positive for swelling in legs/feet.  ?Gastrointestinal:  Negative for constipation.  ?Endocrine: Positive for cold intolerance.  ?Genitourinary:  Negative  for difficulty urinating.  ?Musculoskeletal:  Positive for joint pain, gait problem, joint pain, muscle weakness and morning stiffness. Negative for joint swelling.  ?Skin:  Positive for rash. Negative for sensitivity to sunlight.  ?Allergic/Immunologic: Negative for susceptible to infections.  ?Neurological:  Positive for numbness and weakness.  ?Hematological:  Positive for bruising/bleeding tendency.  ?Psychiatric/Behavioral:  Positive for sleep disturbance.   ? ?PMFS History:  ?Patient Active Problem List  ? Diagnosis Date Noted  ? Cervical radiculopathy 05/24/2020  ? Hypertension   ? Neuropathy of both feet   ? Lumbar foraminal stenosis 08/06/2017  ? HNP (herniated nucleus pulposus), thoracic 09/05/2016  ? Chronic kidney disease (CKD) 04/26/2016  ? DJD (degenerative joint disease), cervical 04/26/2016  ? Essential hypertension 04/26/2016  ? Dyslipidemia 04/26/2016  ? Sleep apnea 04/26/2016  ? Fibromyalgia 04/18/2016  ? Idiopathic chronic gout of multiple sites without tophus 04/18/2016  ? Spondylosis of lumbar region without myelopathy or radiculopathy 04/18/2016  ? Primary osteoarthritis of both knees 04/18/2016  ? Lumbar herniated disc 07/22/2015  ? Morbid obesity ([place]) 02/25/2014  ? S/P right TKA 02/24/2014  ? Heartburn 01/10/2011  ?  ?Past Medical History:  ?Diagnosis Date  ? Anxiety   ? Arthritis   ? "all over" (04/15/2013)  ? Back pain, chronic   ? Chronic neck pain   ? DDD (degenerative disc disease), cervical   ? DDD (degenerative disc disease), lumbar   ?  Fibromyalgia   ? GERD (gastroesophageal reflux disease)   ? Gout   ? Headache   ? High cholesterol   ? History of bronchitis   ? Hypertension   ? Leg cramps   ? Right greater than left  ? Melanoma ([place])   ? Nose  ? MRSA infection   ? Neuropathy of both feet   ? Peripheral vascular disease ([place])   ? Pre-diabetes   ? Radiculopathy of lumbar region   ? Sleep apnea   ? "don't use CPAP" (04/15/2013)  ?  ?Family History  ?Problem Relation Age of Onset  ?  Hypertension Father   ? Cancer Mother   ?     breast   ? Heart Problems Mother   ? Alzheimer's disease Mother   ? Allergies Daughter   ? Interstitial cystitis Daughter   ? ?Past Surgical History:  ?Procedure Laterality Date  ? ANTERIOR CERVICAL DECOMP/DISCECTOMY FUSION  2000's X3  ? "had 3 surgeries on my front neck" (04/15/2013)  ? ANTERIOR LAT LUMBAR FUSION Left 08/06/2017  ? Procedure: LEFT LUMBAR TWO-THREE ANTERIOR LATERAL LUMBAR FUSION WITH PERCUTANEOUS SCREWS;  Surgeon: Earnie Larsson, MD;  Location: [facility];  Service: Neurosurgery;  Laterality: Left;  ? APPENDECTOMY  ~ 2010  ? BACK SURGERY    ? CARPAL TUNNEL RELEASE Left 1990's  ? CATARACT EXTRACTION, BILATERAL    ? COLONOSCOPY    ? ear drum surgery  1974  ? EYE SURGERY Right   ? FLEXIBLE SIGMOIDOSCOPY  12/21/2011  ? Procedure: FLEXIBLE SIGMOIDOSCOPY;  Surgeon: Lear Ng, MD;  [facility];  Service: Endoscopy;  Laterality: N/A;  ? HAND TENDON SURGERY Left ~ 1978 X2-1980's X3  ? "had 5 ORs after laceration"  ? HOT HEMOSTASIS  12/21/2011  ? Procedure: HOT HEMOSTASIS (ARGON PLASMA COAGULATION/BICAP);  Surgeon: Lear Ng, MD;  Location: [facility];  Service: Endoscopy;  Laterality: N/A;  ? INGUINAL HERNIA REPAIR Left 04/15/2013  ? Procedure: LAPAROSCOPIC REPAIR OF INCARCERATED LEFT INGUINAL HERNIA;  Surgeon: Gayland Curry, MD;  Location: [facility];  Service: General;  Laterality: Left;  ? INGUINAL HERNIA REPAIR Bilateral 1970's - 1984  ? INGUINAL HERNIA REPAIR Left 04/15/2013  ? INSERTION OF MESH Left 04/15/2013  ? Procedure: INSERTION OF MESH;  Surgeon: Gayland Curry, MD;  Location: [facility];  Service: General;  Laterality: Left;  ? JOINT REPLACEMENT    ? right knee  ? KNEE ARTHROSCOPY Right 1990's  ? LAPAROSCOPY N/A 04/15/2013  ? Procedure: LAPAROSCOPY DIAGNOSTIC;  Surgeon: Gayland Curry, MD;  Location: [facility];  Service: General;  Laterality: N/A;  ? LUMBAR LAMINECTOMY/DECOMPRESSION MICRODISCECTOMY Right 07/22/2015  ? Procedure: Right Lumbar  Two-Three Microdiscectomy;  Surgeon: Earnie Larsson, MD;  Location: [facility];  Service: Neurosurgery;  Laterality: Right;  ? LUMBAR LAMINECTOMY/DECOMPRESSION MICRODISCECTOMY Right 09/05/2016  ? Procedure: Microdiscectomy - right - T12-L1;  Surgeon: Earnie Larsson, MD;  Location: [facility];  Service: Neurosurgery;  Laterality: Right;  ? NASAL SEPTUM SURGERY Bilateral 2014  ? "removed polyps & infection"  ? POLYPECTOMY  01/10/2011  ? Procedure: POLYPECTOMY;  Surgeon: Lear Ng, MD;  [facility];  Service: Endoscopy;  Laterality: N/A;  ? POSTERIOR CERVICAL LAMINECTOMY Left 05/24/2020  ? Procedure: Laminectomy and Foraminotomy - left - Cervical two-Cervical three;  Surgeon: Earnie Larsson, MD;  Location: [facility];  Service: Neurosurgery;  Laterality: Left;  ? POSTERIOR LUMBAR FUSION  11/1995  ? "ray cages" (04/15/2013)  ? SHOULDER ARTHROSCOPY Bilateral   ? "  twice each"  ? TONSILLECTOMY  1960's  ? TOTAL KNEE ARTHROPLASTY Right 02/24/2014  ? Procedure: TOTAL RIGHT KNEE ARTHROPLASTY;  Surgeon: Mauri Pole, MD;  [facility];  Service: Orthopedics;  Laterality: Right;  ? TOTAL SHOULDER ARTHROPLASTY Left 09/18/2019  ? Procedure: TOTAL SHOULDER ARTHROPLASTY;  Surgeon: Justice Britain, MD;  [facility];  Service: Orthopedics;  Laterality: Left;  166mn  ? TOTAL SHOULDER ARTHROPLASTY Right 11/04/2020  ? Procedure: TOTAL SHOULDER ARTHROPLASTY;  Surgeon: SJustice Britain MD;  [facility];  Service: Orthopedics;  Laterality: Right;  1279m  ? UPPER GI ENDOSCOPY    ? VASECTOMY  1988  ? WISDOM TOOTH EXTRACTION  2000's  ? ?Social History  ? ?Social History Narrative  ? Not on file  ? ?Immunization History  ?Administered Date(s) Administered  ? Influenza Whole 11/08/2011  ? Influenza-Unspecified 02/27/2013  ? PFIZER(Purple Top)SARS-COV-2 Vaccination 06/23/2019, 07/14/2019, 09/22/2020  ?  ? ?Objective: ?Vital Signs: BP (!) 167/101 (BP Location: Left Arm, Patient Position: Sitting, Cuff Size: Normal)   Pulse 64    Resp 16   Ht '5\' 8"'$  (1.727 m)   Wt 223 lb (101.2 kg)   BMI 33.91 kg/m?   ? ?Physical Exam ?Vitals and nursing note reviewed.  ?Constitutional:   ?   Appearance: He is well-developed.  ?HENT:  ?   Head: Normoceph

## 2021-05-16 DIAGNOSIS — Z96611 Presence of right artificial shoulder joint: Secondary | ICD-10-CM | POA: Diagnosis not present

## 2021-05-19 DIAGNOSIS — E119 Type 2 diabetes mellitus without complications: Secondary | ICD-10-CM | POA: Diagnosis not present

## 2021-05-20 DIAGNOSIS — M4316 Spondylolisthesis, lumbar region: Secondary | ICD-10-CM | POA: Diagnosis not present

## 2021-05-24 NOTE — Progress Notes (Signed)
Surgical Instructions ? ? ? Your procedure is scheduled on Tuesday April 4th. ? Report to [facility] Main Entrance "A" at 6 A.M., then check in with the Admitting office. ? Call this number if you have problems the morning of surgery: ? [phone] ? ? If you have any questions prior to your surgery date call [phone]: Open Monday-Friday 8am-4pm ? ? ? Remember: ? Do not eat  or drink after midnight the night before your surgery ? ?  ? Take these medicines the morning of surgery with A SIP OF WATER: ?allopurinol (ZYLOPRIM) 300 MG tablet ?atenolol (TENORMIN) 100 MG tablet ?omeprazole (PRILOSEC) 40 MG capsule ?venlafaxine (EFFEXOR-XR) 150 MG 24 hr capsule ? ?IF NEEDED  ?acetaminophen (TYLENOL) 500 MG tablet ?ALPRAZolam (XANAX) 1 MG tablet ?carboxymethylcellulose (REFRESH PLUS) 0.5 % SOLN ?colchicine 0.6 MG tablet ?cyclobenzaprine (FLEXERIL) 10 MG tablet ?fluticasone (FLONASE) 50 MCG/ACT nasal spray ?Oxycodone HCl 10 MG TABS ?sodium chloride (OCEAN) 0.65 % SOLN nasal spray ?As of today, STOP taking any Aspirin (unless otherwise instructed by your surgeon) Aleve, Naproxen, Ibuprofen, Motrin, Advil, Goody's, BC's, all herbal medications, fish oil, and all vitamins. ? ?         ?Do not wear jewelry  ?Do not wear lotions, powders, colognes, or deodorant. ?Do not shave 48 hours prior to surgery.  Men may shave face and neck. ?Do not bring valuables to the hospital. ?Do not wear nail polish, gel polish, artificial nails, or any other type of covering on natural nails (fingers and toes) ? ? ?[facility] is not responsible for any belongings or valuables. .  ? ?Do NOT Smoke (Tobacco/Vaping)  24 hours prior to your procedure ? ?If you use a CPAP at night, you may bring your mask for your overnight stay. ?  ?Contacts, glasses, hearing aids, dentures or partials may not be worn into surgery, please bring cases for these belongings ?  ?For patients admitted to the hospital, discharge time will be determined by your treatment  team. ?  ?Patients discharged the day of surgery will not be allowed to drive home, and someone needs to stay with them for 24 hours. ? ? ?SURGICAL WAITING ROOM VISITATION ?Patients having surgery or a procedure in a hospital may have two support people. ?Children under the age of 31 must have an adult with them who is not the patient. ?They may stay in the waiting area during the procedure and may switch out with other visitors. If the patient needs to stay at the hospital during part of their recovery, the visitor guidelines for inpatient rooms apply. ? ?Please refer to the [facility] website for the visitor guidelines for Inpatients (after your surgery is over and you are in a regular room).  ? ? ? ? ? ?Special instructions:   ? ?Oral Hygiene is also important to reduce your risk of infection.  Remember - BRUSH YOUR TEETH THE MORNING OF SURGERY WITH YOUR REGULAR TOOTHPASTE ? ? ?[facility]- Preparing For Surgery ? ?Before surgery, you can play an important role. Because skin is not sterile, your skin needs to be as free of germs as possible. You can reduce the number of germs on your skin by washing with CHG (chlorahexidine gluconate) Soap before surgery.  CHG is an antiseptic cleaner which kills germs and bonds with the skin to continue killing germs even after washing.   ? ? ?Please do not use if you have an allergy to CHG or antibacterial soaps. If your skin becomes reddened/irritated stop using  the CHG.  ?Do not shave (including legs and underarms) for at least 48 hours prior to first CHG shower. It is OK to shave your face. ? ?Please follow these instructions carefully. ?  ? ? Shower the NIGHT BEFORE SURGERY and the MORNING OF SURGERY with CHG Soap.  ? If you chose to wash your hair, wash your hair first as usual with your normal shampoo. After you shampoo, rinse your hair and body thoroughly to remove the shampoo.  Then [employer] and genitals (private parts) with your normal soap and rinse thoroughly to  remove soap. ? ?After that Use CHG Soap as you would any other liquid soap. You can apply CHG directly to the skin and wash gently with a scrungie or a clean washcloth.  ? ?Apply the CHG Soap to your body ONLY FROM THE NECK DOWN.  Do not use on open wounds or open sores. Avoid contact with your eyes, ears, mouth and genitals (private parts). Wash Face and genitals (private parts)  with your normal soap.  ? ?Wash thoroughly, paying special attention to the area where your surgery will be performed. ? ?Thoroughly rinse your body with warm water from the neck down. ? ?DO NOT shower/wash with your normal soap after using and rinsing off the CHG Soap. ? ?Pat yourself dry with a CLEAN TOWEL. ? ?Wear CLEAN PAJAMAS to bed the night before surgery ? ?Place CLEAN SHEETS on your bed the night before your surgery ? ?DO NOT SLEEP WITH PETS. ? ? ?Day of Surgery: ? ?Take a shower with CHG soap. ?Wear Clean/Comfortable clothing the morning of surgery ?Do not apply any deodorants/lotions.   ?Remember to brush your teeth WITH YOUR REGULAR TOOTHPASTE. ? ? ? ?If you received a COVID test during your pre-op visit  it is requested that you wear a mask when out in public, stay away from anyone that may not be feeling well and notify your surgeon if you develop symptoms. If you have been in contact with anyone that has tested positive in the last 10 days please notify you surgeon. ? ?  ?Please read over the following fact sheets that you were given.  ? ?

## 2021-05-25 ENCOUNTER — Encounter (HOSPITAL_COMMUNITY)
Admission: RE | Admit: 2021-05-25 | Discharge: 2021-05-25 | Disposition: A | Discharge status: Home / Self Care | Payer: Medicare Other | Source: Ambulatory Visit | Attending: Neurosurgery | Admitting: Neurosurgery

## 2021-05-25 ENCOUNTER — Other Ambulatory Visit: Payer: Self-pay

## 2021-05-25 ENCOUNTER — Ambulatory Visit: Payer: Medicare Other | Admitting: Rheumatology

## 2021-05-25 ENCOUNTER — Encounter: Payer: Self-pay | Admitting: Rheumatology

## 2021-05-25 ENCOUNTER — Encounter (HOSPITAL_COMMUNITY): Payer: Self-pay

## 2021-05-25 VITALS — BP 167/101 | HR 64 | Resp 16 | Ht 68.0 in | Wt 223.0 lb

## 2021-05-25 VITALS — BP 143/93 | HR 58 | Temp 97.5°F | Resp 17 | Ht 68.0 in | Wt 226.6 lb

## 2021-05-25 DIAGNOSIS — Z96611 Presence of right artificial shoulder joint: Secondary | ICD-10-CM | POA: Diagnosis not present

## 2021-05-25 DIAGNOSIS — M5030 Other cervical disc degeneration, unspecified cervical region: Secondary | ICD-10-CM

## 2021-05-25 DIAGNOSIS — M19071 Primary osteoarthritis, right ankle and foot: Secondary | ICD-10-CM | POA: Diagnosis not present

## 2021-05-25 DIAGNOSIS — Z87898 Personal history of other specified conditions: Secondary | ICD-10-CM

## 2021-05-25 DIAGNOSIS — M1712 Unilateral primary osteoarthritis, left knee: Secondary | ICD-10-CM

## 2021-05-25 DIAGNOSIS — M797 Fibromyalgia: Secondary | ICD-10-CM

## 2021-05-25 DIAGNOSIS — M1A09X0 Idiopathic chronic gout, multiple sites, without tophus (tophi): Secondary | ICD-10-CM | POA: Diagnosis not present

## 2021-05-25 DIAGNOSIS — Z96612 Presence of left artificial shoulder joint: Secondary | ICD-10-CM | POA: Diagnosis not present

## 2021-05-25 DIAGNOSIS — M7632 Iliotibial band syndrome, left leg: Secondary | ICD-10-CM

## 2021-05-25 DIAGNOSIS — M19041 Primary osteoarthritis, right hand: Secondary | ICD-10-CM | POA: Diagnosis not present

## 2021-05-25 DIAGNOSIS — Z8719 Personal history of other diseases of the digestive system: Secondary | ICD-10-CM

## 2021-05-25 DIAGNOSIS — Z8639 Personal history of other endocrine, nutritional and metabolic disease: Secondary | ICD-10-CM

## 2021-05-25 DIAGNOSIS — M47816 Spondylosis without myelopathy or radiculopathy, lumbar region: Secondary | ICD-10-CM

## 2021-05-25 DIAGNOSIS — M19042 Primary osteoarthritis, left hand: Secondary | ICD-10-CM

## 2021-05-25 DIAGNOSIS — Z96651 Presence of right artificial knee joint: Secondary | ICD-10-CM

## 2021-05-25 DIAGNOSIS — Z8669 Personal history of other diseases of the nervous system and sense organs: Secondary | ICD-10-CM

## 2021-05-25 DIAGNOSIS — Z8679 Personal history of other diseases of the circulatory system: Secondary | ICD-10-CM

## 2021-05-25 DIAGNOSIS — M19072 Primary osteoarthritis, left ankle and foot: Secondary | ICD-10-CM

## 2021-05-25 DIAGNOSIS — Z01812 Encounter for preprocedural laboratory examination: Secondary | ICD-10-CM | POA: Diagnosis not present

## 2021-05-25 DIAGNOSIS — Z01818 Encounter for other preprocedural examination: Secondary | ICD-10-CM

## 2021-05-25 LAB — BASIC METABOLIC PANEL
Anion gap: 7 (ref 5–15)
BUN: 12 mg/dL (ref 8–23)
CO2: 26 mmol/L (ref 22–32)
Calcium: 9.3 mg/dL (ref 8.9–10.3)
Chloride: 103 mmol/L (ref 98–111)
Creatinine, Ser: 0.98 mg/dL (ref 0.61–1.24)
GFR, Estimated: >60 mL/min (ref >60)
Glucose, Bld: 123 mg/dL — ABNORMAL HIGH (ref 70–99)
Potassium: 4.9 mmol/L (ref 3.5–5.1)
Sodium: 136 mmol/L (ref 135–145)

## 2021-05-25 LAB — CBC
HCT: 47.4 % (ref 39.0–52.0)
Hemoglobin: 15.6 g/dL (ref 13.0–17.0)
MCH: 30.9 pg (ref 26.0–34.0)
MCHC: 32.9 g/dL (ref 30.0–36.0)
MCV: 93.9 fL (ref 80.0–100.0)
Platelets: 238 10*3/uL (ref 150–400)
RBC: 5.05 MIL/uL (ref 4.22–5.81)
RDW: 14.5 % (ref 11.5–15.5)
WBC: 6.8 10*3/uL (ref 4.0–10.5)
nRBC: 0 % (ref 0.0–0.2)

## 2021-05-25 LAB — SURGICAL PCR SCREEN
MRSA, PCR: NEGATIVE
Staphylococcus aureus: NEGATIVE

## 2021-05-25 LAB — TYPE AND SCREEN
ABO/RH(D): A POS
Antibody Screen: NEGATIVE

## 2021-05-25 NOTE — Patient Instructions (Signed)
Iliotibial Band Syndrome Rehab Ask your health care provider which exercises are safe for you. Do exercises exactly as told by your health care provider and adjust them as directed. It is normal to feel mild stretching, pulling, tightness, or discomfort as you do these exercises. Stop right away if you feel sudden pain or your pain gets significantly worse. Do not begin these exercises until told by your health care provider. Stretching and range-of-motion exercises These exercises warm up your muscles and joints and improve the movement andflexibility of your hip and pelvis. Quadriceps stretch, prone  Lie on your abdomen (prone position) on a firm surface, such as a bed or padded floor. Bend your left / right knee and reach back to hold your ankle or pant leg. If you cannot reach your ankle or pant leg, loop a belt around your foot and grab the belt instead. Gently pull your heel toward your buttocks. Your knee should not slide out to the side. You should feel a stretch in the front of your thigh and knee (quadriceps). Hold this position for __________ seconds. Repeat __________ times. Complete this exercise __________ times a day. Iliotibial band stretch An iliotibial band is a strong band of muscle tissue that runs from the outer side of your hip to the outer side of your thigh and knee. Lie on your side with your left / right leg in the top position. Bend both of your knees and grab your left / right ankle. Stretch out your bottom arm to help you balance. Slowly bring your top knee back so your thigh goes behind your trunk. Slowly lower your top leg toward the floor until you feel a gentle stretch on the outside of your left / right hip and thigh. If you do not feel a stretch and your knee will not fall farther, place the heel of your other foot on top of your knee and pull your knee down toward the floor with your foot. Hold this position for __________ seconds. Repeat __________ times.  Complete this exercise __________ times a day. Strengthening exercises These exercises build strength and endurance in your hip and pelvis. Enduranceis the ability to use your muscles for a long time, even after they get tired. Straight leg raises, side-lying This exercise strengthens the muscles that rotate the leg at the hip and move it away from your body (hip abductors). Lie on your side with your left / right leg in the top position. Lie so your head, shoulder, hip, and knee line up. You may bend your bottom knee to help you balance. Roll your hips slightly forward so your hips are stacked directly over each other and your left / right knee is facing forward. Tense the muscles in your outer thigh and lift your top leg 4-6 inches (10-15 cm). Hold this position for __________ seconds. Slowly lower your leg to return to the starting position. Let your muscles relax completely before doing another repetition. Repeat __________ times. Complete this exercise __________ times a day. Leg raises, prone This exercise strengthens the muscles that move the hips backward (hip extensors). Lie on your abdomen (prone position) on your bed or a firm surface. You can put a pillow under your hips if that is more comfortable for your lower back. Bend your left / right knee so your foot is straight up in the air. Squeeze your buttocks muscles and lift your left / right thigh off the bed. Do not let your back arch. Tense your thigh   muscle as hard as you can without increasing any knee pain. Hold this position for __________ seconds. Slowly lower your leg to return to the starting position and allow it to relax completely. Repeat __________ times. Complete this exercise __________ times a day. Hip hike Stand sideways on a bottom step. Stand on your left / right leg with your other foot unsupported next to the step. You can hold on to a railing or wall for balance if needed. Keep your knees straight and your  torso square. Then lift your left / right hip up toward the ceiling. Slowly let your left / right hip lower toward the floor, past the starting position. Your foot should get closer to the floor. Do not lean or bend your knees. Repeat __________ times. Complete this exercise __________ times a day. This information is not intended to replace advice given to you by your health care provider. Make sure you discuss any questions you have with your healthcare provider. Document Revised: 04/23/2019 Document Reviewed: 04/23/2019 Elsevier Patient Education  2022 [employer].  

## 2021-05-25 NOTE — Progress Notes (Signed)
PCP - Antony Contras, MD ?Cardiologist - denies ?Rheumatologist - Bo Merino, MD ? ?PPM/ICD - denies ?Device Orders - n/a ?Rep Notified - n/a ? ?Chest x-ray - n/a ?EKG - 05/25/2021 ?Stress Test - more than 10 years ago - negative per patient ?ECHO - denies ?Cardiac Cath - denies ? ?Sleep Study - yes ?CPAP - doesn't wear ? ?Fasting Blood Sugar - n/a ? ?Blood Thinner Instructions: n/a ? ?Aspirin Instructions: Patient was instructed: As of today, STOP taking any Aspirin (unless otherwise instructed by your surgeon) Aleve, Naproxen, Ibuprofen, Motrin, Advil, Goody's, BC's, all herbal medications, fish oil, and all vitamins. ? ?ERAS Protcol - n/a  ? ?COVID TEST- n/a ? ? ?Anesthesia review: yes - review EKG. BP elevated in PAT when patient arrived 179/105, pulse 56. Patient verbalized that he had BP medication this morning and he denied any distress. At the end of PAT appointment, when rechecked, BP was 140/100, pulse 56, and manual 143/93 with pulse 58. ? ?Patient denies shortness of breath, fever, cough and chest pain at PAT appointment ? ? ?All instructions explained to the patient, with a verbal understanding of the material. Patient agrees to go over the instructions while at home for a better understanding. Patient also instructed to self quarantine after being tested for COVID-19. The opportunity to ask questions was provided. ?  ?

## 2021-05-26 LAB — URIC ACID: Uric Acid, Serum: 4.5 mg/dL (ref 4.0–8.0)

## 2021-05-26 NOTE — Progress Notes (Signed)
Uric acid is in desirable range.  No change in treatment advised.

## 2021-05-31 ENCOUNTER — Other Ambulatory Visit: Payer: Self-pay

## 2021-05-31 ENCOUNTER — Inpatient Hospital Stay (HOSPITAL_COMMUNITY): Payer: Medicare Other

## 2021-05-31 ENCOUNTER — Inpatient Hospital Stay (HOSPITAL_COMMUNITY)
Admission: RE | Disposition: A | Discharge status: Home / Self Care | Payer: Self-pay | Source: Home / Self Care | Attending: Neurosurgery

## 2021-05-31 ENCOUNTER — Inpatient Hospital Stay (HOSPITAL_COMMUNITY): Payer: Medicare Other | Admitting: Anesthesiology

## 2021-05-31 ENCOUNTER — Inpatient Hospital Stay (HOSPITAL_COMMUNITY)
Admission: RE | Admit: 2021-05-31 | Discharge: 2021-06-01 | DRG: 455 | Disposition: A | Discharge status: Home / Self Care | Payer: Medicare Other | Attending: Neurosurgery | Admitting: Neurosurgery

## 2021-05-31 ENCOUNTER — Inpatient Hospital Stay (HOSPITAL_COMMUNITY): Payer: Medicare Other | Admitting: Physician Assistant

## 2021-05-31 ENCOUNTER — Encounter (HOSPITAL_COMMUNITY): Payer: Self-pay | Admitting: Neurosurgery

## 2021-05-31 DIAGNOSIS — Z79899 Other long term (current) drug therapy: Secondary | ICD-10-CM | POA: Diagnosis not present

## 2021-05-31 DIAGNOSIS — M4317 Spondylolisthesis, lumbosacral region: Secondary | ICD-10-CM

## 2021-05-31 DIAGNOSIS — Z9889 Other specified postprocedural states: Secondary | ICD-10-CM | POA: Diagnosis not present

## 2021-05-31 DIAGNOSIS — M797 Fibromyalgia: Secondary | ICD-10-CM | POA: Diagnosis present

## 2021-05-31 DIAGNOSIS — M109 Gout, unspecified: Secondary | ICD-10-CM | POA: Diagnosis not present

## 2021-05-31 DIAGNOSIS — Z96611 Presence of right artificial shoulder joint: Secondary | ICD-10-CM | POA: Diagnosis present

## 2021-05-31 DIAGNOSIS — I739 Peripheral vascular disease, unspecified: Secondary | ICD-10-CM | POA: Diagnosis present

## 2021-05-31 DIAGNOSIS — Z96612 Presence of left artificial shoulder joint: Secondary | ICD-10-CM | POA: Diagnosis present

## 2021-05-31 DIAGNOSIS — G8929 Other chronic pain: Secondary | ICD-10-CM | POA: Diagnosis not present

## 2021-05-31 DIAGNOSIS — M4807 Spinal stenosis, lumbosacral region: Secondary | ICD-10-CM | POA: Diagnosis present

## 2021-05-31 DIAGNOSIS — Z8249 Family history of ischemic heart disease and other diseases of the circulatory system: Secondary | ICD-10-CM

## 2021-05-31 DIAGNOSIS — M48061 Spinal stenosis, lumbar region without neurogenic claudication: Secondary | ICD-10-CM | POA: Diagnosis present

## 2021-05-31 DIAGNOSIS — G4730 Sleep apnea, unspecified: Secondary | ICD-10-CM | POA: Diagnosis present

## 2021-05-31 DIAGNOSIS — E7800 Pure hypercholesterolemia, unspecified: Secondary | ICD-10-CM | POA: Diagnosis not present

## 2021-05-31 DIAGNOSIS — M5416 Radiculopathy, lumbar region: Secondary | ICD-10-CM | POA: Diagnosis not present

## 2021-05-31 DIAGNOSIS — M4310 Spondylolisthesis, site unspecified: Principal | ICD-10-CM | POA: Diagnosis present

## 2021-05-31 DIAGNOSIS — Z85820 Personal history of malignant melanoma of skin: Secondary | ICD-10-CM

## 2021-05-31 DIAGNOSIS — Z981 Arthrodesis status: Secondary | ICD-10-CM | POA: Diagnosis not present

## 2021-05-31 DIAGNOSIS — M5136 Other intervertebral disc degeneration, lumbar region: Secondary | ICD-10-CM | POA: Diagnosis present

## 2021-05-31 DIAGNOSIS — Z888 Allergy status to other drugs, medicaments and biological substances status: Secondary | ICD-10-CM

## 2021-05-31 DIAGNOSIS — I10 Essential (primary) hypertension: Secondary | ICD-10-CM

## 2021-05-31 DIAGNOSIS — M4316 Spondylolisthesis, lumbar region: Principal | ICD-10-CM | POA: Diagnosis present

## 2021-05-31 DIAGNOSIS — Z87891 Personal history of nicotine dependence: Secondary | ICD-10-CM | POA: Diagnosis not present

## 2021-05-31 DIAGNOSIS — G629 Polyneuropathy, unspecified: Secondary | ICD-10-CM | POA: Diagnosis not present

## 2021-05-31 DIAGNOSIS — M9983 Other biomechanical lesions of lumbar region: Secondary | ICD-10-CM | POA: Diagnosis not present

## 2021-05-31 DIAGNOSIS — K219 Gastro-esophageal reflux disease without esophagitis: Secondary | ICD-10-CM | POA: Diagnosis present

## 2021-05-31 DIAGNOSIS — Z881 Allergy status to other antibiotic agents status: Secondary | ICD-10-CM

## 2021-05-31 DIAGNOSIS — Z96651 Presence of right artificial knee joint: Secondary | ICD-10-CM | POA: Diagnosis not present

## 2021-05-31 DIAGNOSIS — F419 Anxiety disorder, unspecified: Secondary | ICD-10-CM | POA: Diagnosis present

## 2021-05-31 DIAGNOSIS — M5417 Radiculopathy, lumbosacral region: Secondary | ICD-10-CM | POA: Diagnosis not present

## 2021-05-31 HISTORY — PX: BACK SURGERY: SHX140

## 2021-05-31 SURGERY — POSTERIOR LUMBAR FUSION 2 LEVEL
Anesthesia: General | Site: Back

## 2021-05-31 MED ORDER — SODIUM CHLORIDE 0.9 % IV SOLN
250.0000 mL | INTRAVENOUS | Status: DC
  Administered 2021-05-31: 250 mL via INTRAVENOUS

## 2021-05-31 MED ORDER — FENTANYL CITRATE (PF) 100 MCG/2ML IJ SOLN
INTRAMUSCULAR | Status: AC
  Filled 2021-05-31: qty 2

## 2021-05-31 MED ORDER — EPHEDRINE SULFATE-NACL 50-0.9 MG/10ML-% IV SOSY
PREFILLED_SYRINGE | INTRAVENOUS | Status: DC | PRN
  Administered 2021-05-31: 10 mg via INTRAVENOUS
  Administered 2021-05-31: 15 mg via INTRAVENOUS

## 2021-05-31 MED ORDER — CHLORHEXIDINE GLUCONATE CLOTH 2 % EX PADS
6.0000 | MEDICATED_PAD | Freq: Once | CUTANEOUS | Status: DC
Start: 2021-05-31 — End: 2021-05-31

## 2021-05-31 MED ORDER — ONDANSETRON HCL 4 MG PO TABS: 4.0000 mg | ORAL_TABLET | Freq: Four times a day (QID) | ORAL | Status: DC | PRN

## 2021-05-31 MED ORDER — EPHEDRINE 5 MG/ML INJ
INTRAVENOUS | Status: AC
  Filled 2021-05-31: qty 5

## 2021-05-31 MED ORDER — CARBOXYMETHYLCELLULOSE SODIUM 0.5 % OP SOLN: 1.0000 [drp] | Freq: Every day | OPHTHALMIC | Status: DC | PRN

## 2021-05-31 MED ORDER — VASOPRESSIN 20 UNIT/ML IV SOLN
INTRAVENOUS | Status: AC
  Filled 2021-05-31: qty 1

## 2021-05-31 MED ORDER — MIDAZOLAM HCL 2 MG/2ML IJ SOLN
INTRAMUSCULAR | Status: DC | PRN
  Administered 2021-05-31: 2 mg via INTRAVENOUS

## 2021-05-31 MED ORDER — LACTATED RINGERS IV SOLN: INTRAVENOUS | Status: DC

## 2021-05-31 MED ORDER — ROCURONIUM BROMIDE 10 MG/ML (PF) SYRINGE
PREFILLED_SYRINGE | INTRAVENOUS | Status: AC
  Filled 2021-05-31: qty 20

## 2021-05-31 MED ORDER — ROCURONIUM BROMIDE 10 MG/ML (PF) SYRINGE
PREFILLED_SYRINGE | INTRAVENOUS | Status: DC | PRN
  Administered 2021-05-31: 40 mg via INTRAVENOUS
  Administered 2021-05-31: 60 mg via INTRAVENOUS
  Administered 2021-05-31 (×2): 30 mg via INTRAVENOUS

## 2021-05-31 MED ORDER — PHENYLEPHRINE 40 MCG/ML (10ML) SYRINGE FOR IV PUSH (FOR BLOOD PRESSURE SUPPORT)
PREFILLED_SYRINGE | INTRAVENOUS | Status: AC
  Filled 2021-05-31: qty 10

## 2021-05-31 MED ORDER — MIDAZOLAM HCL 2 MG/2ML IJ SOLN
INTRAMUSCULAR | Status: AC
  Filled 2021-05-31: qty 2

## 2021-05-31 MED ORDER — MELATONIN 5 MG PO TABS: 10.0000 mg | ORAL_TABLET | Freq: Every evening | ORAL | Status: DC | PRN

## 2021-05-31 MED ORDER — TURMERIC 500 MG PO CAPS: 500.0000 mg | ORAL_CAPSULE | Freq: Every morning | ORAL | Status: DC

## 2021-05-31 MED ORDER — ALPRAZOLAM 0.5 MG PO TABS: 1.0000 mg | ORAL_TABLET | Freq: Every day | ORAL | Status: DC | PRN

## 2021-05-31 MED ORDER — CHLORHEXIDINE GLUCONATE 0.12 % MT SOLN
15.0000 mL | Freq: Once | OROMUCOSAL | Status: AC
  Administered 2021-05-31: 15 mL via OROMUCOSAL
  Filled 2021-05-31: qty 15

## 2021-05-31 MED ORDER — LIDOCAINE 2% (20 MG/ML) 5 ML SYRINGE
INTRAMUSCULAR | Status: DC | PRN
Start: 2021-05-31 — End: 2021-05-31
  Administered 2021-05-31: 60 mg via INTRAVENOUS

## 2021-05-31 MED ORDER — PROPOFOL 10 MG/ML IV BOLUS
INTRAVENOUS | Status: AC
  Filled 2021-05-31: qty 20

## 2021-05-31 MED ORDER — POLYETHYLENE GLYCOL 3350 17 G PO PACK: 17.0000 g | PACK | Freq: Every day | ORAL | Status: DC | PRN

## 2021-05-31 MED ORDER — VENLAFAXINE HCL ER 75 MG PO CP24
150.0000 mg | ORAL_CAPSULE | Freq: Every day | ORAL | Status: DC
  Administered 2021-06-01: 150 mg via ORAL
  Filled 2021-05-31: qty 2

## 2021-05-31 MED ORDER — ACETAMINOPHEN 650 MG RE SUPP: 650.0000 mg | RECTAL | Status: DC | PRN

## 2021-05-31 MED ORDER — SODIUM CHLORIDE 0.9% FLUSH: 3.0000 mL | Freq: Two times a day (BID) | INTRAVENOUS | Status: DC

## 2021-05-31 MED ORDER — PHENYLEPHRINE HCL-NACL 20-0.9 MG/250ML-% IV SOLN
INTRAVENOUS | Status: DC | PRN
Start: 2021-05-31 — End: 2021-05-31
  Administered 2021-05-31: 10 ug/min via INTRAVENOUS

## 2021-05-31 MED ORDER — FENTANYL CITRATE (PF) 250 MCG/5ML IJ SOLN
INTRAMUSCULAR | Status: AC
  Filled 2021-05-31: qty 5

## 2021-05-31 MED ORDER — SODIUM CHLORIDE 0.9 % IV SOLN
INTRAVENOUS | Status: DC | PRN
Start: 2021-05-31 — End: 2021-05-31

## 2021-05-31 MED ORDER — ONDANSETRON HCL 4 MG/2ML IJ SOLN: 4.0000 mg | Freq: Four times a day (QID) | INTRAMUSCULAR | Status: DC | PRN

## 2021-05-31 MED ORDER — FLUTICASONE PROPIONATE 50 MCG/ACT NA SUSP
1.0000 | Freq: Two times a day (BID) | NASAL | Status: DC
  Filled 2021-05-31 (×2): qty 16

## 2021-05-31 MED ORDER — SUGAMMADEX SODIUM 200 MG/2ML IV SOLN
INTRAVENOUS | Status: DC | PRN
  Administered 2021-05-31: 200 mg via INTRAVENOUS

## 2021-05-31 MED ORDER — DEXAMETHASONE SODIUM PHOSPHATE 10 MG/ML IJ SOLN
INTRAMUSCULAR | Status: DC | PRN
Start: 2021-05-31 — End: 2021-05-31
  Administered 2021-05-31: 10 mg via INTRAVENOUS

## 2021-05-31 MED ORDER — MSM 1000 MG PO CAPS: 1000.0000 mg | ORAL_CAPSULE | Freq: Every morning | ORAL | Status: DC

## 2021-05-31 MED ORDER — OXYCODONE HCL 5 MG PO TABS: 5.0000 mg | ORAL_TABLET | Freq: Once | ORAL | Status: DC | PRN

## 2021-05-31 MED ORDER — CEFAZOLIN SODIUM-DEXTROSE 2-4 GM/100ML-% IV SOLN
2.0000 g | INTRAVENOUS | Status: AC
  Administered 2021-05-31: 2 g via INTRAVENOUS
  Filled 2021-05-31: qty 100

## 2021-05-31 MED ORDER — ADULT MULTIVITAMIN W/MINERALS CH
1.0000 | ORAL_TABLET | Freq: Every day | ORAL | Status: DC
  Administered 2021-06-01: 1 via ORAL
  Filled 2021-05-31: qty 1

## 2021-05-31 MED ORDER — HYDROMORPHONE HCL 1 MG/ML IJ SOLN
0.2500 mg | INTRAMUSCULAR | Status: DC | PRN
  Administered 2021-05-31 (×4): 0.5 mg via INTRAVENOUS

## 2021-05-31 MED ORDER — ONDANSETRON HCL 4 MG/2ML IJ SOLN
INTRAMUSCULAR | Status: AC
  Filled 2021-05-31: qty 2

## 2021-05-31 MED ORDER — GINSENG 250 MG PO CAPS
400.0000 mg | ORAL_CAPSULE | Freq: Every morning | ORAL | Status: DC
Start: 2021-06-01 — End: 2021-05-31

## 2021-05-31 MED ORDER — HYDROCODONE-ACETAMINOPHEN 10-325 MG PO TABS: 1.0000 | ORAL_TABLET | ORAL | Status: DC | PRN

## 2021-05-31 MED ORDER — SODIUM CHLORIDE (PF) 0.9 % IJ SOLN
INTRAMUSCULAR | Status: AC
  Filled 2021-05-31: qty 10

## 2021-05-31 MED ORDER — FENTANYL CITRATE (PF) 250 MCG/5ML IJ SOLN
INTRAMUSCULAR | Status: DC | PRN
  Administered 2021-05-31 (×3): 50 ug via INTRAVENOUS
  Administered 2021-05-31: 150 ug via INTRAVENOUS
  Administered 2021-05-31: 50 ug via INTRAVENOUS

## 2021-05-31 MED ORDER — CHLORHEXIDINE GLUCONATE CLOTH 2 % EX PADS: 6.0000 | MEDICATED_PAD | Freq: Once | CUTANEOUS | Status: DC

## 2021-05-31 MED ORDER — OXYCODONE HCL 10 MG PO TABS: 10.0000 mg | ORAL_TABLET | Freq: Four times a day (QID) | ORAL | Status: DC | PRN

## 2021-05-31 MED ORDER — ALLOPURINOL 300 MG PO TABS
150.0000 mg | ORAL_TABLET | Freq: Every day | ORAL | Status: DC
  Administered 2021-06-01: 150 mg via ORAL
  Filled 2021-05-31: qty 0.5

## 2021-05-31 MED ORDER — PHENYLEPHRINE 40 MCG/ML (10ML) SYRINGE FOR IV PUSH (FOR BLOOD PRESSURE SUPPORT)
PREFILLED_SYRINGE | INTRAVENOUS | Status: DC | PRN
  Administered 2021-05-31: 80 ug via INTRAVENOUS

## 2021-05-31 MED ORDER — SALINE SPRAY 0.65 % NA SOLN: 1.0000 | NASAL | Status: DC | PRN

## 2021-05-31 MED ORDER — THROMBIN (RECOMBINANT) 20000 UNITS EX SOLR
CUTANEOUS | Status: AC
  Filled 2021-05-31: qty 20000

## 2021-05-31 MED ORDER — CALCIUM CARBONATE 600 MG PO TABS: 600.0000 mg | ORAL_TABLET | Freq: Every morning | ORAL | Status: DC

## 2021-05-31 MED ORDER — GLYCOPYRROLATE PF 0.2 MG/ML IJ SOSY
PREFILLED_SYRINGE | INTRAMUSCULAR | Status: AC
  Filled 2021-05-31: qty 1

## 2021-05-31 MED ORDER — OXYCODONE HCL 5 MG PO TABS
10.0000 mg | ORAL_TABLET | ORAL | Status: DC | PRN
  Administered 2021-05-31: 10 mg via ORAL
  Filled 2021-05-31: qty 2

## 2021-05-31 MED ORDER — ORAL CARE MOUTH RINSE: 15.0000 mL | Freq: Once | OROMUCOSAL | Status: AC

## 2021-05-31 MED ORDER — ALBUMIN HUMAN 5 % IV SOLN: INTRAVENOUS | Status: DC | PRN

## 2021-05-31 MED ORDER — OXYCODONE HCL 5 MG PO TABS
20.0000 mg | ORAL_TABLET | ORAL | Status: DC | PRN
  Administered 2021-05-31 – 2021-06-01 (×4): 20 mg via ORAL
  Filled 2021-05-31 (×4): qty 4

## 2021-05-31 MED ORDER — SODIUM CHLORIDE 0.9% FLUSH
3.0000 mL | INTRAVENOUS | Status: DC | PRN
  Administered 2021-05-31: 3 mL via INTRAVENOUS

## 2021-05-31 MED ORDER — TAMSULOSIN HCL 0.4 MG PO CAPS
0.4000 mg | ORAL_CAPSULE | Freq: Every day | ORAL | Status: DC
Start: 2021-05-31 — End: 2021-06-01
  Administered 2021-05-31: 0.4 mg via ORAL
  Filled 2021-05-31: qty 1

## 2021-05-31 MED ORDER — VASOPRESSIN 20 UNIT/ML IV SOLN
INTRAVENOUS | Status: DC | PRN
  Administered 2021-05-31: 2 [IU] via INTRAVENOUS
  Administered 2021-05-31: 1 [IU] via INTRAVENOUS

## 2021-05-31 MED ORDER — DIAZEPAM 5 MG PO TABS
5.0000 mg | ORAL_TABLET | Freq: Four times a day (QID) | ORAL | Status: DC | PRN
  Administered 2021-05-31: 5 mg via ORAL
  Administered 2021-05-31: 10 mg via ORAL
  Administered 2021-06-01: 5 mg via ORAL
  Filled 2021-05-31 (×2): qty 1
  Filled 2021-05-31: qty 2

## 2021-05-31 MED ORDER — OMEGA-3 FATTY ACIDS 1000 MG PO CAPS: 1.0000 g | ORAL_CAPSULE | Freq: Every day | ORAL | Status: DC

## 2021-05-31 MED ORDER — FENTANYL CITRATE (PF) 250 MCG/5ML IJ SOLN
INTRAMUSCULAR | Status: AC
Start: 2021-05-31 — End: ?
  Filled 2021-05-31: qty 5

## 2021-05-31 MED ORDER — PSYLLIUM 51.7 % PO PACK: PACK | Freq: Every day | ORAL | Status: DC

## 2021-05-31 MED ORDER — VITAMIN D3 25 MCG PO TABS: 1000.0000 [IU] | ORAL_TABLET | Freq: Every morning | ORAL | Status: DC

## 2021-05-31 MED ORDER — MAGNESIUM GLUCONATE 500 MG PO TABS: 500.0000 mg | ORAL_TABLET | Freq: Every morning | ORAL | Status: DC

## 2021-05-31 MED ORDER — PHENYLEPHRINE HCL (PRESSORS) 10 MG/ML IV SOLN
INTRAVENOUS | Status: AC
  Filled 2021-05-31: qty 1

## 2021-05-31 MED ORDER — LIDOCAINE 2% (20 MG/ML) 5 ML SYRINGE
INTRAMUSCULAR | Status: AC
  Filled 2021-05-31: qty 10

## 2021-05-31 MED ORDER — ATENOLOL 50 MG PO TABS
100.0000 mg | ORAL_TABLET | Freq: Every morning | ORAL | Status: DC
Start: 2021-06-01 — End: 2021-06-01
  Administered 2021-06-01: 100 mg via ORAL
  Filled 2021-05-31: qty 2

## 2021-05-31 MED ORDER — BUPIVACAINE LIPOSOME 1.3 % IJ SUSP
INTRAMUSCULAR | Status: AC
  Filled 2021-05-31: qty 20

## 2021-05-31 MED ORDER — GELATIN ABSORBABLE 100 EX MISC
CUTANEOUS | Status: DC | PRN
  Administered 2021-05-31: 20 mL via TOPICAL

## 2021-05-31 MED ORDER — OXYCODONE HCL 5 MG/5ML PO SOLN: 5.0000 mg | Freq: Once | ORAL | Status: DC | PRN

## 2021-05-31 MED ORDER — ONDANSETRON HCL 4 MG/2ML IJ SOLN
INTRAMUSCULAR | Status: DC | PRN
  Administered 2021-05-31: 4 mg via INTRAVENOUS

## 2021-05-31 MED ORDER — PROPOFOL 10 MG/ML IV BOLUS
INTRAVENOUS | Status: DC | PRN
  Administered 2021-05-31: 170 mg via INTRAVENOUS
  Administered 2021-05-31 (×3): 10 mg via INTRAVENOUS

## 2021-05-31 MED ORDER — VITAMIN E 45 MG (100 UNIT) PO CAPS: 400.0000 [IU] | ORAL_CAPSULE | Freq: Every morning | ORAL | Status: DC

## 2021-05-31 MED ORDER — SELENIUM 200 MCG PO TABS: 200.0000 ug | ORAL_TABLET | Freq: Every morning | ORAL | Status: DC

## 2021-05-31 MED ORDER — FLEET ENEMA 7-19 GM/118ML RE ENEM: 1.0000 | ENEMA | Freq: Once | RECTAL | Status: DC | PRN

## 2021-05-31 MED ORDER — BUPIVACAINE LIPOSOME 1.3 % IJ SUSP
INTRAMUSCULAR | Status: DC | PRN
  Administered 2021-05-31: 20 mL

## 2021-05-31 MED ORDER — PHENOL 1.4 % MT LIQD: 1.0000 | OROMUCOSAL | Status: DC | PRN

## 2021-05-31 MED ORDER — VANCOMYCIN HCL 1000 MG IV SOLR
INTRAVENOUS | Status: AC
  Filled 2021-05-31: qty 20

## 2021-05-31 MED ORDER — CEFAZOLIN SODIUM-DEXTROSE 1-4 GM/50ML-% IV SOLN
1.0000 g | Freq: Three times a day (TID) | INTRAVENOUS | Status: AC
  Administered 2021-05-31 (×2): 1 g via INTRAVENOUS
  Filled 2021-05-31 (×2): qty 50

## 2021-05-31 MED ORDER — GLYCOPYRROLATE PF 0.2 MG/ML IJ SOSY
PREFILLED_SYRINGE | INTRAMUSCULAR | Status: DC | PRN
  Administered 2021-05-31: .2 mg via INTRAVENOUS

## 2021-05-31 MED ORDER — FENTANYL CITRATE (PF) 100 MCG/2ML IJ SOLN
25.0000 ug | INTRAMUSCULAR | Status: DC | PRN
  Administered 2021-05-31: 25 ug via INTRAVENOUS
  Administered 2021-05-31 (×2): 50 ug via INTRAVENOUS
  Administered 2021-05-31: 25 ug via INTRAVENOUS

## 2021-05-31 MED ORDER — ACETAMINOPHEN 325 MG PO TABS
650.0000 mg | ORAL_TABLET | ORAL | Status: DC | PRN
  Administered 2021-05-31 (×2): 650 mg via ORAL
  Filled 2021-05-31 (×2): qty 2

## 2021-05-31 MED ORDER — BUPIVACAINE HCL (PF) 0.25 % IJ SOLN
INTRAMUSCULAR | Status: AC
  Filled 2021-05-31: qty 30

## 2021-05-31 MED ORDER — 0.9 % SODIUM CHLORIDE (POUR BTL) OPTIME
TOPICAL | Status: DC | PRN
  Administered 2021-05-31: 1000 mL

## 2021-05-31 MED ORDER — BIOTIN 5000 MCG PO CAPS: 5000.0000 ug | ORAL_CAPSULE | Freq: Every morning | ORAL | Status: DC

## 2021-05-31 MED ORDER — BISACODYL 10 MG RE SUPP: 10.0000 mg | Freq: Every day | RECTAL | Status: DC | PRN

## 2021-05-31 MED ORDER — ROCURONIUM BROMIDE 10 MG/ML (PF) SYRINGE
PREFILLED_SYRINGE | INTRAVENOUS | Status: AC
  Filled 2021-05-31: qty 10

## 2021-05-31 MED ORDER — PANTOPRAZOLE SODIUM 40 MG PO TBEC
40.0000 mg | DELAYED_RELEASE_TABLET | Freq: Every day | ORAL | Status: DC
Start: 2021-05-31 — End: 2021-06-01
  Administered 2021-05-31 – 2021-06-01 (×2): 40 mg via ORAL
  Filled 2021-05-31 (×2): qty 1

## 2021-05-31 MED ORDER — IRBESARTAN 150 MG PO TABS
300.0000 mg | ORAL_TABLET | Freq: Every day | ORAL | Status: DC
  Administered 2021-06-01: 300 mg via ORAL
  Filled 2021-05-31: qty 2

## 2021-05-31 MED ORDER — DEXAMETHASONE SODIUM PHOSPHATE 10 MG/ML IJ SOLN
INTRAMUSCULAR | Status: AC
  Filled 2021-05-31: qty 1

## 2021-05-31 MED ORDER — MENTHOL 3 MG MT LOZG: 1.0000 | LOZENGE | OROMUCOSAL | Status: DC | PRN

## 2021-05-31 MED ORDER — HYDROMORPHONE HCL 1 MG/ML IJ SOLN
1.0000 mg | INTRAMUSCULAR | Status: DC | PRN
  Administered 2021-05-31 – 2021-06-01 (×3): 1 mg via INTRAVENOUS
  Filled 2021-05-31 (×3): qty 1

## 2021-05-31 MED ORDER — HYDROMORPHONE HCL 1 MG/ML IJ SOLN
INTRAMUSCULAR | Status: AC
  Filled 2021-05-31: qty 1

## 2021-05-31 MED ORDER — GUAIFENESIN ER 600 MG PO TB12
600.0000 mg | ORAL_TABLET | Freq: Every evening | ORAL | Status: DC | PRN
  Filled 2021-05-31: qty 1

## 2021-05-31 MED ORDER — TESTOSTERONE CYPIONATE 200 MG/ML IM SOLN: 100.0000 mg | INTRAMUSCULAR | Status: DC

## 2021-05-31 MED ORDER — VANCOMYCIN HCL 1000 MG IV SOLR
INTRAVENOUS | Status: DC | PRN
  Administered 2021-05-31: 1000 mg via TOPICAL

## 2021-05-31 MED ORDER — BUPIVACAINE HCL (PF) 0.25 % IJ SOLN
INTRAMUSCULAR | Status: DC | PRN
  Administered 2021-05-31: 10 mL

## 2021-05-31 SURGICAL SUPPLY — 71 items
ADH SKN CLS APL DERMABOND .7 (GAUZE/BANDAGES/DRESSINGS) ×1
APL SKNCLS STERI-STRIP NONHPOA (GAUZE/BANDAGES/DRESSINGS) ×1
BAG COUNTER SPONGE SURGICOUNT (BAG) ×4 IMPLANT
BAG DECANTER FOR FLEXI CONT (MISCELLANEOUS) ×3 IMPLANT
BAG SPNG CNTER NS LX DISP (BAG) ×2
BENZOIN TINCTURE PRP APPL 2/3 (GAUZE/BANDAGES/DRESSINGS) ×3 IMPLANT
BLADE CLIPPER SURG (BLADE) IMPLANT
BONE GRAFTON DBF INJECT 6CC (Bone Implant) ×2 IMPLANT
BUR CUTTER 7.0 ROUND (BURR) IMPLANT
BUR MATCHSTICK NEURO 3.0 LAGG (BURR) ×3 IMPLANT
CAGE EXP CATALYFT 9 (Plate) ×4 IMPLANT
CANISTER SUCT 3000ML PPV (MISCELLANEOUS) ×3 IMPLANT
CAP LCK SPNE (Orthopedic Implant) ×6 IMPLANT
CAP LOCK SPINE RADIUS (Orthopedic Implant) IMPLANT
CAP LOCKING (Orthopedic Implant) ×12 IMPLANT
CARTRIDGE OIL MAESTRO DRILL (MISCELLANEOUS) ×2 IMPLANT
CNTNR URN SCR LID CUP LEK RST (MISCELLANEOUS) ×2 IMPLANT
CONT SPEC 4OZ STRL OR WHT (MISCELLANEOUS) ×2
COVER BACK TABLE 60X90IN (DRAPES) ×3 IMPLANT
DECANTER SPIKE VIAL GLASS SM (MISCELLANEOUS) ×2 IMPLANT
DERMABOND ADVANCED (GAUZE/BANDAGES/DRESSINGS) ×1
DERMABOND ADVANCED .7 DNX12 (GAUZE/BANDAGES/DRESSINGS) ×2 IMPLANT
DIFFUSER DRILL AIR PNEUMATIC (MISCELLANEOUS) ×3 IMPLANT
DRAPE C-ARM 42X72 X-RAY (DRAPES) ×6 IMPLANT
DRAPE HALF SHEET 40X57 (DRAPES) IMPLANT
DRAPE LAPAROTOMY 100X72X124 (DRAPES) ×3 IMPLANT
DRAPE SURG 17X23 STRL (DRAPES) ×12 IMPLANT
DRSG OPSITE POSTOP 4X6 (GAUZE/BANDAGES/DRESSINGS) ×3 IMPLANT
DRSG OPSITE POSTOP 4X8 (GAUZE/BANDAGES/DRESSINGS) ×2 IMPLANT
DURAPREP 26ML APPLICATOR (WOUND CARE) ×3 IMPLANT
ELECT REM PT RETURN 9FT ADLT (ELECTROSURGICAL) ×2
ELECTRODE REM PT RTRN 9FT ADLT (ELECTROSURGICAL) ×2 IMPLANT
EVACUATOR 1/8 PVC DRAIN (DRAIN) IMPLANT
GAUZE 4X4 16PLY ~~LOC~~+RFID DBL (SPONGE) ×1 IMPLANT
GAUZE SPONGE 4X4 12PLY STRL (GAUZE/BANDAGES/DRESSINGS) IMPLANT
GLOVE SURG ENC MOIS LTX SZ6.5 (GLOVE) ×3 IMPLANT
GLOVE SURG LTX SZ9 (GLOVE) ×6 IMPLANT
GLOVE SURG UNDER POLY LF SZ6.5 (GLOVE) ×3 IMPLANT
GOWN STRL REUS W/ TWL LRG LVL3 (GOWN DISPOSABLE) IMPLANT
GOWN STRL REUS W/ TWL XL LVL3 (GOWN DISPOSABLE) ×4 IMPLANT
GOWN STRL REUS W/TWL 2XL LVL3 (GOWN DISPOSABLE) IMPLANT
GOWN STRL REUS W/TWL LRG LVL3 (GOWN DISPOSABLE) ×4
GOWN STRL REUS W/TWL XL LVL3 (GOWN DISPOSABLE) ×4
KIT BASIN OR (CUSTOM PROCEDURE TRAY) ×3 IMPLANT
KIT TURNOVER KIT B (KITS) ×3 IMPLANT
MILL MEDIUM DISP (BLADE) ×3 IMPLANT
NDL HYPO 21X1.5 SAFETY (NEEDLE) IMPLANT
NEEDLE HYPO 21X1.5 SAFETY (NEEDLE) ×2 IMPLANT
NEEDLE HYPO 22GX1.5 SAFETY (NEEDLE) ×3 IMPLANT
NS IRRIG 1000ML POUR BTL (IV SOLUTION) ×3 IMPLANT
OIL CARTRIDGE MAESTRO DRILL (MISCELLANEOUS) ×2
PACK LAMINECTOMY NEURO (CUSTOM PROCEDURE TRAY) ×3 IMPLANT
ROD 70MM (Rod) ×2 IMPLANT
ROD 80MM (Rod) ×2 IMPLANT
ROD SPNL 70X5.5XNS TI RDS (Rod) IMPLANT
ROD SPNL 80X5.5 NS TI RDS (Rod) IMPLANT
SCREW 6.75X40MM (Screw) ×2 IMPLANT
SCREW 6.75X45MM (Screw) ×4 IMPLANT
SPONGE SURGIFOAM ABS GEL 100 (HEMOSTASIS) ×3 IMPLANT
SPONGE T-LAP 4X18 ~~LOC~~+RFID (SPONGE) ×1 IMPLANT
STRIP CLOSURE SKIN 1/2X4 (GAUZE/BANDAGES/DRESSINGS) ×6 IMPLANT
SUT VIC AB 0 CT1 18XCR BRD8 (SUTURE) ×4 IMPLANT
SUT VIC AB 0 CT1 8-18 (SUTURE) ×4
SUT VIC AB 2-0 CT1 18 (SUTURE) ×3 IMPLANT
SUT VIC AB 3-0 SH 8-18 (SUTURE) ×6 IMPLANT
SYR 20ML LL LF (SYRINGE) ×1 IMPLANT
TAPE STRIPS DRAPE STRL (GAUZE/BANDAGES/DRESSINGS) ×1 IMPLANT
TOWEL GREEN STERILE (TOWEL DISPOSABLE) ×3 IMPLANT
TOWEL GREEN STERILE FF (TOWEL DISPOSABLE) ×3 IMPLANT
TRAY FOLEY MTR SLVR 16FR STAT (SET/KITS/TRAYS/PACK) ×3 IMPLANT
WATER STERILE IRR 1000ML POUR (IV SOLUTION) ×3 IMPLANT

## 2021-05-31 NOTE — Op Note (Signed)
Date of procedure: 05/31/2021 ? ?Date of dictation: Same ? ?Service: Neurosurgery ? ?Preoperative diagnosis: L4-5, L5-S1 degenerative spondylolisthesis with lateral recess and foraminal stenosis and radiculopathy ? ?Postoperative diagnosis: Same ? ?Procedure Name: Bilateral L4-5, L5-S1 decompressive laminotomies and foraminotomies, more than would be required for support of body fusion alone. ? ?L4-5, L5-S1 posterior lumbar interbody fusion utilizing interbody cages, local harvested autograft, and demineralized bone matrix ? ?L4-5 S1 posterior lateral thesis utilizing segmental pedicle screw fixation and local autografting. ? ?Surgeon:Melia Hopes A.Ellagrace Yoshida, M.D. ? ?Asst. Surgeon: Reinaldo Meeker, NP ? ?Anesthesia: General ? ?Indication: 66 year old male with a long history of spinal problems presents with worsening back and bilateral lower extremity pain failing conservative management.  Work-up demonstrates evidence of severe disc generation with associated severe facet arthropathy and degenerative spondylolisthesis at L4-5 and L5-S1 with marked lateral recess and foraminal stenosis.  Patient presents now for two-level lumbar decompression and fusion surgery is improving his symptoms. ? ?Operative note: After induction of anesthesia, patient position prone onto Wilson frame and properly padded.  Lumbar region prepped and draped sterilely.  Incision made from L4-S1.  Dissection performed bilaterally.  Retractor placed.  Fluoroscopy used.  Levels confirmed.  Decompressive laminotomies and facetectomies then performed using Leksell rongeurs, Kerrison rongeurs and high-speed drill to remove the inferior two thirds of the lamina of L4 and L5 bilaterally the entire inferior facet of L5 as well as the pars interarticularis bilaterally, and the majority of the superior facet of L5 and S1 bilaterally as well as the superior rim of the lamina of L5 and S1.  Ligament flavum elevated and resected.  Aggressive foraminotomies were performed along  course the exiting L4-L5 and S1 nerve roots.  Bilateral discectomies then performed at L4-5.  The spaces then prepared for interbody fusion.  With a distractor placed patient's right side to space was cleaned of soft tissue.  A 9 mm [employer] expandable cage was then impacted in the place and expanded.  Distractor removed patient's right side.  Disc base prepared on the right side.  Morselized autograft packed in the interspace.  Second cage was then impacted into place and expanded.  Procedures then repeated L5-S1 in a similar fashion again using 9 mm cages and autograft.  Pedicles of L4-L5 and S1 were identified using surface landmarks and intraoperative fluoroscopy superficial bone overlying the pedicle was then removed using high-speed drill.  Each pedicle was then probed using a pedicle awl.  Each pedicle all track was probed and found to be solidly within the bone.  Each pedicle all track was then tapped with a screw tap.  Screw Hole was probed and found to be solidly within the bone.  6.75 x 45 mm radius brand screws from [employer] placed bilaterally at L4 and L5 6.75 x 40 mm screws placed bilaterally at L5.  Demineralized bone matrix was then packed into each cage bilaterally.  Final images reveal good position of cages and hardware proper upper level with normal alignment of the spine.  Transverse processes and sacral ala were decorticated.  Morselized autograft was packed posterior laterally.  Short segment titanium rod placed over the screw heads at L4-L5 and S1.  Locking caps placed over the screws.  Locking caps then engaged with a construct under compression.  L5 was placed over the laminotomy defects.  Vancomycin powder was placed in deep wound space.  Wounds then closed in layers with Vicryl sutures.  Steri-Strips and sterile dressing were applied.  No apparent complications.  Patient tolerated the procedure  well and he returns to the recovery room postop. ?

## 2021-05-31 NOTE — Plan of Care (Signed)

## 2021-05-31 NOTE — Transfer of Care (Signed)
Immediate Anesthesia Transfer of Care Note ? ?Patient: Victor [name] ? ?Procedure(s) Performed: POSTERIOR LUMBAR INTERBODY FUSION - LUMBAR FOUR-LUMBAR FIVE  - LUMBAR FIVE -SACRAL ONE (Back) ? ?Patient Location: PACU ? ?Anesthesia Type:General ? ?Level of Consciousness: awake, alert , oriented, patient cooperative and responds to stimulation ? ?Airway & Oxygen Therapy: Patient Spontanous Breathing ? ?Post-op Assessment: Report given to RN and Post -op Vital signs reviewed and stable ? ?Post vital signs: Reviewed and stable ? ?Last Vitals:  ?Vitals Value Taken Time  ?BP 134/90 05/31/21 1210  ?Temp    ?Pulse 67 05/31/21 1214  ?Resp 8 05/31/21 1214  ?SpO2 100 % 05/31/21 1214  ?Vitals shown include unvalidated device data. ? ?Last Pain:  ?Vitals:  ? 05/31/21 0709  ?TempSrc:   ?PainSc: 7   ?   ? ?Patients Stated Pain Goal: 2 (05/31/21 0709) ? ?Complications: No notable events documented. ?

## 2021-05-31 NOTE — Progress Notes (Signed)
Orthopedic Tech Progress Note ?Patient Details:  ?Victor [name] ?08/06/1955 ?[id number] ? ?PACU RN stated" patient has back brace"  ? ?Patient ID: Victor [name], male   DOB: 17-Jun-1955, 66 y.o.   MRN: [id number] ? ?Victor [name] ?05/31/2021, 1:11 PM ? ?

## 2021-05-31 NOTE — Progress Notes (Signed)
PT Cancellation Note ? ?Patient Details ?Name: Victor [name] ?MRN: [id number] ?DOB: 08-12-1955 ? ? ?Cancelled Treatment:    Reason Eval/Treat Not Completed: Patient declined, no reason specified.  I've done too much, now the "window has passed" and I'm too painful. ?05/31/2021 ? ?Ginger Carne., PT ?Acute Rehabilitation Services ?[phone]  (pager) ?[phone]  (office) ? ? ?Tessie Fass Kayzen Kendzierski ?05/31/2021, 6:41 PM ?

## 2021-05-31 NOTE — Anesthesia Procedure Notes (Addendum)
Procedure Name: Intubation ?Date/Time: 05/31/2021 8:09 AM ?Performed by: Betha Loa, CRNA ?Pre-anesthesia Checklist: Patient identified, Emergency Drugs available, Suction available and Patient being monitored ?Patient Re-evaluated:Patient Re-evaluated prior to induction ?Oxygen Delivery Method: Circle System Utilized ?Preoxygenation: Pre-oxygenation with 100% oxygen ?Induction Type: IV induction ?Ventilation: Mask ventilation without difficulty and Oral airway inserted - appropriate to patient size ?Laryngoscope Size: Mac and 4 ?Grade View: Grade I ?Tube type: Oral ?Tube size: 7.5 mm ?Number of attempts: 1 ?Airway Equipment and Method: Stylet and Oral airway ?Placement Confirmation: ETT inserted through vocal cords under direct vision, positive ETCO2 and breath sounds checked- equal and bilateral ?Secured at: 23 cm ?Tube secured with: Tape ?Dental Injury: Teeth and Oropharynx as per pre-operative assessment  ?Difficulty Due To: Difficulty was anticipated and Difficult Airway- due to reduced neck mobility ?Comments: Glidescope at [facility]. ?Smooth IV induction, Mask ventilate with OPA, Grade 1 view with MAC4 for atraumatic intubation. ? ? ? ? ?

## 2021-05-31 NOTE — Brief Op Note (Signed)
05/31/2021 ? ?11:58 AM ? ?PATIENT:  Victor [name]  66 y.o. male ? ?PRE-OPERATIVE DIAGNOSIS:  Spondylolisthesis ? ?POST-OPERATIVE DIAGNOSIS:  Spondylolisthesis ? ?PROCEDURE:  Procedure(s): ?POSTERIOR LUMBAR INTERBODY FUSION - LUMBAR FOUR-LUMBAR FIVE  - LUMBAR FIVE -SACRAL ONE (N/A) ? ?SURGEON:  Surgeon(s) and Role: ?   Earnie Larsson, MD - Primary ? ?PHYSICIAN ASSISTANT:  ? ?ASSISTANTS: Bergman,NP  ? ?ANESTHESIA:   general ? ?EBL:  300 mL  ? ?BLOOD ADMINISTERED:none ? ?DRAINS: none  ? ?LOCAL MEDICATIONS USED:  MARCAINE    ? ?SPECIMEN:  No Specimen ? ?DISPOSITION OF SPECIMEN:  N/A ? ?COUNTS:  YES ? ?TOURNIQUET:  * No tourniquets in log * ? ?DICTATION: .Dragon Dictation ? ?PLAN OF CARE: Admit to inpatient  ? ?PATIENT DISPOSITION:  PACU - hemodynamically stable. ?  ?Delay start of Pharmacological VTE agent (>24hrs) due to surgical blood loss or risk of bleeding: yes ? ?

## 2021-05-31 NOTE — Anesthesia Procedure Notes (Signed)
Arterial Line Insertion ?Start/End4/05/2021 8:34 AM, 05/31/2021 8:37 AM ?Performed by: Betha Loa, CRNA, CRNA ? Patient location: OR. ?Preanesthetic checklist: patient identified, IV checked, site marked, risks and benefits discussed, surgical consent, monitors and equipment checked, pre-op evaluation, timeout performed and anesthesia consent ?Right, radial was placed ?Catheter size: 20 G ?Maximum sterile barriers used  ? ?Attempts: 1 ?Procedure performed without using ultrasound guided technique. ?Following insertion, dressing applied. ?Post procedure assessment: normal ? ?Patient tolerated the procedure well with no immediate complications. ? ? ?

## 2021-05-31 NOTE — Anesthesia Preprocedure Evaluation (Signed)
Anesthesia Evaluation  ?Patient identified by MRN, date of birth, ID band ?Patient awake ? ? ? ?Reviewed: ?Allergy & Precautions, H&P , NPO status , Patient's Chart, lab work & pertinent test results ? ?Airway ?Mallampati: II ? ? ?Neck ROM: full ? ? ? Dental ?  ?Pulmonary ?sleep apnea , former smoker,  ?  ?breath sounds clear to auscultation ? ? ? ? ? ? Cardiovascular ?hypertension, + Peripheral Vascular Disease  ? ?Rhythm:regular Rate:Normal ? ? ?  ?Neuro/Psych ? Headaches, Anxiety  Neuromuscular disease   ? GI/Hepatic ?GERD  ,  ?Endo/Other  ? ? Renal/GU ?  ? ?  ?Musculoskeletal ? ?(+) Arthritis , Fibromyalgia - ? Abdominal ?  ?Peds ? Hematology ?  ?Anesthesia Other Findings ? ? Reproductive/Obstetrics ? ?  ? ? ? ? ? ? ? ? ? ? ? ? ? ?  ?  ? ? ? ? ? ? ? ? ?Anesthesia Physical ?Anesthesia Plan ? ?ASA: 3 ? ?Anesthesia Plan: General  ? ?Post-op Pain Management:   ? ?Induction: Intravenous ? ?PONV Risk Score and Plan: 2 and Ondansetron, Dexamethasone, Midazolam and Treatment may vary due to age or medical condition ? ?Airway Management Planned: Oral ETT ? ?Additional Equipment:  ? ?Intra-op Plan:  ? ?Post-operative Plan: Extubation in OR ? ?Informed Consent: I have reviewed the patients History and Physical, chart, labs and discussed the procedure including the risks, benefits and alternatives for the proposed anesthesia with the patient or authorized representative who has indicated his/her understanding and acceptance.  ? ? ? ?Dental advisory given ? ?Plan Discussed with: CRNA, Anesthesiologist and Surgeon ? ?Anesthesia Plan Comments:   ? ? ? ? ? ? ?Anesthesia Quick Evaluation ? ?

## 2021-05-31 NOTE — H&P (Signed)
Victor [name] is an 66 y.o. male.   ?Chief Complaint: Back pain ?HPI: 66 year old male with chronic and progressively worsening back and bilateral lower extremity symptoms left greater than right.  Patient is status post prior L2-3 and L3-4 decompression and fusion surgery.  Patient with worsening degenerative disease with degenerative spondylolisthesis and severe facet arthropathy with lateral recess and foraminal stenosis at L4-5 and severe disc degeneration with retrolisthesis and foraminal stenosis at L5-S1.  Patient presents now for two-level lumbar decompression and fusion in hopes of improving his symptoms. ? ?Past Medical History:  ?Diagnosis Date  ? Anxiety   ? Arthritis   ? "all over" (04/15/2013)  ? Back pain, chronic   ? Chronic neck pain   ? DDD (degenerative disc disease), cervical   ? DDD (degenerative disc disease), lumbar   ? Fibromyalgia   ? GERD (gastroesophageal reflux disease)   ? Gout   ? Headache   ? High cholesterol   ? History of bronchitis   ? Hypertension   ? Leg cramps   ? Right greater than left  ? Melanoma ([place])   ? Nose  ? MRSA infection   ? Neuropathy of both feet   ? Peripheral vascular disease ([place])   ? Pre-diabetes   ? Radiculopathy of lumbar region   ? Sleep apnea   ? "don't use CPAP" (04/15/2013)  ? ? ?Past Surgical History:  ?Procedure Laterality Date  ? ANTERIOR CERVICAL DECOMP/DISCECTOMY FUSION  2000's X3  ? "had 3 surgeries on my front neck" (04/15/2013)  ? ANTERIOR LAT LUMBAR FUSION Left 08/06/2017  ? Procedure: LEFT LUMBAR TWO-THREE ANTERIOR LATERAL LUMBAR FUSION WITH PERCUTANEOUS SCREWS;  Surgeon: Earnie Larsson, MD;  Location: [facility];  Service: Neurosurgery;  Laterality: Left;  ? APPENDECTOMY  ~ 2010  ? BACK SURGERY    ? CARPAL TUNNEL RELEASE Left 1990's  ? CATARACT EXTRACTION, BILATERAL    ? COLONOSCOPY    ? ear drum surgery  1974  ? EYE SURGERY Right   ? FLEXIBLE SIGMOIDOSCOPY  12/21/2011  ? Procedure: FLEXIBLE SIGMOIDOSCOPY;  Surgeon: Lear Ng, MD;   Location: [facility];  Service: Endoscopy;  Laterality: N/A;  ? HAND TENDON SURGERY Left ~ 1978 X2-1980's X3  ? "had 5 ORs after laceration"  ? HOT HEMOSTASIS  12/21/2011  ? Procedure: HOT HEMOSTASIS (ARGON PLASMA COAGULATION/BICAP);  Surgeon: Lear Ng, MD;  Location: [facility];  Service: Endoscopy;  Laterality: N/A;  ? INGUINAL HERNIA REPAIR Left 04/15/2013  ? Procedure: LAPAROSCOPIC REPAIR OF INCARCERATED LEFT INGUINAL HERNIA;  Surgeon: Gayland Curry, MD;  Location: [facility];  Service: General;  Laterality: Left;  ? INGUINAL HERNIA REPAIR Bilateral 1970's - 1984  ? INGUINAL HERNIA REPAIR Left 04/15/2013  ? INSERTION OF MESH Left 04/15/2013  ? Procedure: INSERTION OF MESH;  Surgeon: Gayland Curry, MD;  Location: [facility];  Service: General;  Laterality: Left;  ? JOINT REPLACEMENT    ? right knee  ? KNEE ARTHROSCOPY Right 1990's  ? LAPAROSCOPY N/A 04/15/2013  ? Procedure: LAPAROSCOPY DIAGNOSTIC;  Surgeon: Gayland Curry, MD;  Location: [facility];  Service: General;  Laterality: N/A;  ? LUMBAR LAMINECTOMY/DECOMPRESSION MICRODISCECTOMY Right 07/22/2015  ? Procedure: Right Lumbar Two-Three Microdiscectomy;  Surgeon: Earnie Larsson, MD;  Location: [facility];  Service: Neurosurgery;  Laterality: Right;  ? LUMBAR LAMINECTOMY/DECOMPRESSION MICRODISCECTOMY Right 09/05/2016  ? Procedure: Microdiscectomy - right - T12-L1;  Surgeon: Earnie Larsson, MD;  Location: [facility];  Service: Neurosurgery;  Laterality: Right;  ?  NASAL SEPTUM SURGERY Bilateral 2014  ? "removed polyps & infection"  ? POLYPECTOMY  01/10/2011  ? Procedure: POLYPECTOMY;  Surgeon: Lear Ng, MD;  Location: [facility];  Service: Endoscopy;  Laterality: N/A;  ? POSTERIOR CERVICAL LAMINECTOMY Left 05/24/2020  ? Procedure: Laminectomy and Foraminotomy - left - Cervical two-Cervical three;  Surgeon: Earnie Larsson, MD;  Location: [facility];  Service: Neurosurgery;  Laterality: Left;  ? POSTERIOR LUMBAR FUSION  11/1995  ? "ray cages" (04/15/2013)  ? SHOULDER  ARTHROSCOPY Bilateral   ? "twice each"  ? TONSILLECTOMY  1960's  ? TOTAL KNEE ARTHROPLASTY Right 02/24/2014  ? Procedure: TOTAL RIGHT KNEE ARTHROPLASTY;  Surgeon: Mauri Pole, MD;  Location: [facility];  Service: Orthopedics;  Laterality: Right;  ? TOTAL SHOULDER ARTHROPLASTY Left 09/18/2019  ? Procedure: TOTAL SHOULDER ARTHROPLASTY;  Surgeon: Justice Britain, MD;  Location: [facility];  Service: Orthopedics;  Laterality: Left;  154mn  ? TOTAL SHOULDER ARTHROPLASTY Right 11/04/2020  ? Procedure: TOTAL SHOULDER ARTHROPLASTY;  Surgeon: SJustice Britain MD;  Location: [facility];  Service: Orthopedics;  Laterality: Right;  122m  ? UPPER GI ENDOSCOPY    ? VASECTOMY  1988  ? WISDOM TOOTH EXTRACTION  2000's  ? ? ?Family History  ?Problem Relation Age of Onset  ? Hypertension Father   ? Cancer Mother   ?     breast   ? Heart Problems Mother   ? Alzheimer's disease Mother   ? Allergies Daughter   ? Interstitial cystitis Daughter   ? ?Social History:  reports that he quit smoking about 11 years ago. His smoking use included cigarettes and cigars. He has a 1.50 pack-year smoking history. He has never used smokeless tobacco. He reports current alcohol use of about 5.0 standard drinks per week. He reports that he does not use drugs. ? ?Allergies:  ?Allergies  ?Allergen Reactions  ? Tetracyclines & Related Other (See Comments)  ?  " makes me angry"  ? Doxycycline Other (See Comments)  ?  Upset stomach  ? Niacin And Related Itching  ? ? ?Medications Prior to Admission  ?Medication Sig Dispense Refill  ? acetaminophen (TYLENOL) 500 MG tablet Take 500 mg by mouth every 8 (eight) hours as needed for moderate pain.    ? allopurinol (ZYLOPRIM) 300 MG tablet TAKE 1 AND 1/2 TABLETS DAILY BY MOUTH 135 tablet 0  ? ALPRAZolam (XANAX) 1 MG tablet Take 1 mg by mouth daily as needed for anxiety.    ? atenolol (TENORMIN) 100 MG tablet Take 100 mg by mouth every morning.    ? b complex vitamins tablet Take 1 tablet by mouth in the morning.    ? Biotin 5000  MCG CAPS Take 5,000 mcg by mouth in the morning.    ? Calcium Carbonate (CALCIUM 600 PO) Take 600 mg by mouth in the morning.    ? carboxymethylcellulose (REFRESH PLUS) 0.5 % SOLN Place 1 drop into both eyes daily as needed (Dry eyes).    ? colchicine 0.6 MG tablet TAKE 1 TABLET (0.6 MG TOTAL) BY MOUTH 2 (TWO) TIMES DAILY AS NEEDED. (Patient taking differently: Take 0.6 mg by mouth 2 (two) times daily as needed (gout).) 180 tablet 0  ? cyclobenzaprine (FLEXERIL) 10 MG tablet Take 1 tablet (10 mg total) by mouth 3 (three) times daily as needed for muscle spasms. 30 tablet 1  ? fish oil-omega-3 fatty acids 1000 MG capsule Take 1 g by mouth in the morning and at bedtime.    ?  fluticasone (FLONASE) 50 MCG/ACT nasal spray Place 1 spray into both nostrils 2 (two) times daily. For congestion    ? GINSENG PO Take 400 mg by mouth in the morning.    ? guaiFENesin (MUCINEX) 600 MG 12 hr tablet Take 600 mg by mouth at bedtime as needed (Congestion).    ? hydrocortisone cream 1 % Apply 1 application topically daily as needed for itching.    ? ibuprofen (ADVIL) 200 MG tablet Take 200 mg by mouth 2 (two) times daily as needed (pain.).    ? magnesium gluconate (MAGONATE) 500 MG tablet Take 500 mg by mouth in the morning.    ? Melatonin 10 MG TABS Take 10 mg by mouth at bedtime as needed (sleep).    ? Methylsulfonylmethane (MSM) 1000 MG CAPS Take 1,000 mg by mouth in the morning.    ? Multiple Vitamins-Minerals (MULTIVITAMIN WITH MINERALS) tablet Take 1 tablet by mouth daily.    ? neomycin-bacitracin-polymyxin (NEOSPORIN) ointment Apply 1 application topically daily as needed for wound care.    ? NONFORMULARY OR COMPOUNDED ITEM Apply 1 g topically daily as needed (foot discomfort/pain.). Doxepin 5%, Amantadine 3%, Pentoxifylline 3%, Lamotrigine 2%, Sertraline 3%, Gabapentin 6% PCCA Lipoderm Base Cream    ? olmesartan (BENICAR) 40 MG tablet Take 40 mg by mouth in the morning.    ? omeprazole (PRILOSEC) 40 MG capsule Take 40 mg by  mouth in the morning.    ? Oxycodone HCl 10 MG TABS Take 10 mg by mouth every 6 (six) hours as needed (pain.).    ? Psyllium (METAMUCIL FIBER PO) Take 1 Scoop by mouth daily in the afternoon.    ? RAPAFLO 8 MG CAP

## 2021-06-01 MED ORDER — DIAZEPAM 5 MG PO TABS: 5.0000 mg | ORAL_TABLET | Freq: Four times a day (QID) | ORAL | 0 refills | Status: DC | PRN

## 2021-06-01 MED ORDER — HYDROMORPHONE HCL 2 MG PO TABS: 2.0000 mg | ORAL_TABLET | ORAL | 0 refills | Status: DC | PRN

## 2021-06-01 MED FILL — Thrombin (Recombinant) For Soln 20000 Unit: CUTANEOUS | Qty: 1 | Status: AC

## 2021-06-01 NOTE — Discharge Instructions (Addendum)
Wound Care Keep incision covered and dry for three days.  Do not put any creams, lotions, or ointments on incision. Leave steri-strips on back.  They will fall off by themselves. Activity Walk each and every day, increasing distance each day. No lifting greater than 5 lbs.  Avoid excessive neck motion. No driving for 2 weeks; may ride as a passenger locally. If provided with back brace, wear when out of bed.  It is not necessary to wear brace in bed. Diet Resume your normal diet.   Call Your Doctor If Any of These Occur Redness, drainage, or swelling at the wound.  Temperature greater than 101 degrees. Severe pain not relieved by pain medication. Incision starts to come apart. Follow Up Appt Call today for appointment in 1-2 weeks ([phone]) or for problems.  If you have any hardware placed in your spine, you will need an x-ray before your appointment.  

## 2021-06-01 NOTE — Anesthesia Postprocedure Evaluation (Signed)
Anesthesia Post Note ? ?Patient: Joevon Holliman Meidinger ? ?Procedure(s) Performed: POSTERIOR LUMBAR INTERBODY FUSION - LUMBAR FOUR-LUMBAR FIVE  - LUMBAR FIVE -SACRAL ONE (Back) ? ?  ? ?Patient location during evaluation: PACU ?Anesthesia Type: General ?Level of consciousness: awake and alert ?Pain management: pain level controlled ?Vital Signs Assessment: post-procedure vital signs reviewed and stable ?Respiratory status: spontaneous breathing, nonlabored ventilation, respiratory function stable and patient connected to nasal cannula oxygen ?Cardiovascular status: blood pressure returned to baseline and stable ?Postop Assessment: no apparent nausea or vomiting ?Anesthetic complications: no ? ? ?No notable events documented. ? ?Last Vitals:  ?Vitals:  ? 06/01/21 0325 06/01/21 0803  ?BP: 134/78 125/79  ?Pulse: 77 79  ?Resp: 18 17  ?Temp: 37.2 ?C 36.8 ?C  ?SpO2: 100% 97%  ?  ?Last Pain:  ?Vitals:  ? 06/01/21 0803  ?TempSrc: Oral  ?PainSc:   ? ? ?  ?  ?  ?  ?  ?  ? ?[place] S ? ? ? ? ?

## 2021-06-01 NOTE — Progress Notes (Signed)
Patient alert and oriented, voiding adequately, skin clean, dry and intact without evidence of skin break down, or symptoms of complications - no redness or edema noted, only slight tenderness at site.  Patient states pain is manageable at time of discharge. Patient has an appointment with MD in 2 weeks 

## 2021-06-01 NOTE — Discharge Summary (Signed)
Physician Discharge Summary  ?Patient ID: ?Victor [name] ?MRN: [id number] ?DOB/AGE: 04/05/1955 66 y.o. ? ?Admit date: 05/31/2021 ?Discharge date: 06/01/2021 ? ?Admission Diagnoses: ? ?Discharge Diagnoses:  ?Principal Problem: ?  Degenerative spondylolisthesis ? ? ?Discharged Condition: good ? ?Hospital Course: Patient admitted to hospital where underwent uncomplicated two-level lumbar decompression and fusion.  Postoperatively he is progressing reasonably well.  He has expected amount of postoperative incisional pain.  He is having no lower extremity pain.  He is standing ambulating and voiding without difficulty.  Feels ready for discharge home. ? ?Consults:  ? ?Significant Diagnostic Studies:  ? ?Treatments:  ? ?Discharge Exam: ?Blood pressure 125/79, pulse 79, temperature 98.3 ?F (36.8 ?C), temperature source Oral, resp. rate 17, height '5\' 8"'$  (1.727 m), weight 101.6 kg, SpO2 97 %. ?Awake and alert.  Oriented and appropriate.  Motor and sensory function intact.  Wound clean and dry.  Chest and abdomen benign. ? ?Disposition: Discharge disposition: 01-Home or Self Care ? ? ? ? ? ? ? ?Allergies as of 06/01/2021   ? ?   Reactions  ? Tetracyclines & Related Other (See Comments)  ? " makes me angry"  ? Doxycycline Other (See Comments)  ? Upset stomach  ? Niacin And Related Itching  ? ?  ? ?  ?Medication List  ?  ? ?STOP taking these medications   ? ?Oxycodone HCl 10 MG Tabs ?  ? ?  ? ?TAKE these medications   ? ?acetaminophen 500 MG tablet ?Commonly known as: TYLENOL ?Take 500 mg by mouth every 8 (eight) hours as needed for moderate pain. ?  ?allopurinol 300 MG tablet ?Commonly known as: ZYLOPRIM ?TAKE 1 AND 1/2 TABLETS DAILY BY MOUTH ?  ?ALPRAZolam 1 MG tablet ?Commonly known as: [name] ?Take 1 mg by mouth daily as needed for anxiety. ?  ?atenolol 100 MG tablet ?Commonly known as: TENORMIN ?Take 100 mg by mouth every morning. ?  ?b complex vitamins tablet ?Take 1 tablet by mouth in the morning. ?  ?Biotin 5000 MCG  Caps ?Take 5,000 mcg by mouth in the morning. ?  ?CALCIUM 600 PO ?Take 600 mg by mouth in the morning. ?  ?carboxymethylcellulose 0.5 % Soln ?Commonly known as: REFRESH PLUS ?Place 1 drop into both eyes daily as needed (Dry eyes). ?  ?colchicine 0.6 MG tablet ?TAKE 1 TABLET (0.6 MG TOTAL) BY MOUTH 2 (TWO) TIMES DAILY AS NEEDED. ?What changed: reasons to take this ?  ?cyclobenzaprine 10 MG tablet ?Commonly known as: FLEXERIL ?Take 1 tablet (10 mg total) by mouth 3 (three) times daily as needed for muscle spasms. ?  ?diazepam 5 MG tablet ?Commonly known as: VALIUM ?Take 1-2 tablets (5-10 mg total) by mouth every 6 (six) hours as needed for muscle spasms. ?  ?fish oil-omega-3 fatty acids 1000 MG capsule ?Take 1 g by mouth in the morning and at bedtime. ?  ?fluticasone 50 MCG/ACT nasal spray ?Commonly known as: FLONASE ?Place 1 spray into both nostrils 2 (two) times daily. For congestion ?  ?GINSENG PO ?Take 400 mg by mouth in the morning. ?  ?guaiFENesin 600 MG 12 hr tablet ?Commonly known as: [place] ?Take 600 mg by mouth at bedtime as needed (Congestion). ?  ?hydrocortisone cream 1 % ?Apply 1 application topically daily as needed for itching. ?  ?HYDROmorphone 2 MG tablet ?Commonly known as: Dilaudid ?Take 1-2 tablets (2-4 mg total) by mouth every 4 (four) hours as needed for severe pain. ?  ?ibuprofen 200 MG tablet ?Commonly known as:  ADVIL ?Take 200 mg by mouth 2 (two) times daily as needed (pain.). ?  ?magnesium gluconate 500 MG tablet ?Commonly known as: MAGONATE ?Take 500 mg by mouth in the morning. ?  ?Melatonin 10 MG Tabs ?Take 10 mg by mouth at bedtime as needed (sleep). ?  ?METAMUCIL FIBER PO ?Take 1 Scoop by mouth daily in the afternoon. ?  ?MSM 1000 MG Caps ?Take 1,000 mg by mouth in the morning. ?  ?multivitamin with minerals tablet ?Take 1 tablet by mouth daily. ?  ?neomycin-bacitracin-polymyxin ointment ?Commonly known as: NEOSPORIN ?Apply 1 application topically daily as needed for wound care. ?   ?NONFORMULARY OR COMPOUNDED ITEM ?Apply 1 g topically daily as needed (foot discomfort/pain.). Doxepin 5%, Amantadine 3%, Pentoxifylline 3%, Lamotrigine 2%, Sertraline 3%, Gabapentin 6% PCCA Lipoderm Base Cream ?  ?olmesartan 40 MG tablet ?Commonly known as: BENICAR ?Take 40 mg by mouth in the morning. ?  ?omeprazole 40 MG capsule ?Commonly known as: PRILOSEC ?Take 40 mg by mouth in the morning. ?  ?Rapaflo 8 MG Caps capsule ?Generic drug: silodosin ?Take 8 mg by mouth at bedtime. ?  ?selenium 200 MCG Tabs tablet ?Take 200 mcg by mouth in the morning. ?  ?sodium chloride 0.65 % Soln nasal spray ?Commonly known as: OCEAN ?Place 1 spray into both nostrils as needed for congestion. ?  ?testosterone cypionate 200 MG/ML injection ?Commonly known as: DEPOTESTOSTERONE CYPIONATE ?Inject 100 mg into the muscle every 14 (fourteen) days. 0.5 mL ?  ?Turmeric 500 MG Caps ?Take 500 mg by mouth in the morning. ?  ?venlafaxine XR 150 MG 24 hr capsule ?Commonly known as: EFFEXOR-XR ?Take 150 mg by mouth daily with breakfast. ?  ?Vitamin D3 25 MCG tablet ?Commonly known as: Vitamin D ?Take 1,000 Units by mouth in the morning. ?  ?vitamin E 180 MG (400 UNITS) capsule ?Take 400 Units by mouth in the morning. ?  ? ?  ? ?  ?  ? ? ?  ?Durable Medical Equipment  ?(From admission, onward)  ?  ? ? ?  ? ?  Start     Ordered  ? 05/31/21 1518  DME Walker rolling  Once       ?Question:  Patient needs a walker to treat with the following condition  Answer:  Degenerative spondylolisthesis  ? 05/31/21 1517  ? 05/31/21 1518  DME 3 n 1  Once       ? 05/31/21 1517  ? ?  ?  ? ?  ? ? ? ?Signed: ?Cooper Render Karsyn Jamie ?06/01/2021, 9:13 AM ? ? ?

## 2021-06-01 NOTE — Evaluation (Signed)
Physical Therapy Evaluation and Discharge ?Patient Details ?Name: Victor [name] ?MRN: [id number] ?DOB: 03-12-1955 ?Today's Date: 06/01/2021 ? ?History of Present Illness ? 66 yo male s/p PLIF L4-5 L5-S1 interbody cages with autograft and demineralized bone matrix PMH: chronic back pain, anxiety, back sxs and cervical sxs. DDD, fibromyalgia, HTN, neuropathy bil feet, PVD sleep apnea, R TKA, BIL TSA ?  ?Clinical Impression ? Patient evaluated by Physical Therapy with no further acute PT needs identified. All education has been completed and the patient has no further questions. Pt was able to demonstrate transfers and ambulation with gross modified independence and no AD. Pt was educated on precautions, brace application/wearing schedule, appropriate activity progression, and car transfer. See below for any follow-up Physical Therapy or equipment needs. PT is signing off. Thank you for this referral.    ?   ? ?Recommendations for follow up therapy are one component of a multi-disciplinary discharge planning process, led by the attending physician.  Recommendations may be updated based on patient status, additional functional criteria and insurance authorization. ? ?Follow Up Recommendations No PT follow up ? ?  ?Assistance Recommended at Discharge PRN  ?Patient can return home with the following ? Assist for transportation;Assistance with cooking/housework ? ?  ?Equipment Recommendations None recommended by PT  ?Recommendations for Other Services ?    ?  ?Functional Status Assessment Patient has had a recent decline in their functional status and demonstrates the ability to make significant improvements in function in a reasonable and predictable amount of time.  ? ?  ?Precautions / Restrictions Precautions ?Precautions: Back ?Precaution Booklet Issued: Yes (comment) ?Precaution Comments: Reviewed handout and pt was cued for precautions during functional mobility. ?Required Braces or Orthoses: Spinal Brace ?Spinal  Brace: Lumbar corset;Applied in standing position ?Restrictions ?Weight Bearing Restrictions: No  ? ?  ? ?Mobility ? Bed Mobility ?Overal bed mobility: Modified Independent ?  ?  ?  ?  ?  ?  ?General bed mobility comments: has electric bed at home so allowed to elevate HOB but bed rail removed. ?  ? ?Transfers ?Overall transfer level: Modified independent ?Equipment used: None ?  ?  ?  ?  ?  ?  ?  ?  ?  ? ?Ambulation/Gait ?Ambulation/Gait assistance: Modified independent (Device/Increase time) ?Gait Distance (Feet): 500 Feet ?Assistive device: None ?Gait Pattern/deviations: Step-through pattern, Decreased stride length, Trunk flexed ?Gait velocity: Decreased ?Gait velocity interpretation: 1.31 - 2.62 ft/sec, indicative of limited community ambulator ?  ?General Gait Details: Pt ambulating well despite reports of R knee pain (baseline). No gross unsteadiness or overt LOB noted. ? ?Stairs ?Stairs: Yes ?Stairs assistance: Modified independent (Device/Increase time) ?Stair Management: One rail Right, Alternating pattern, Forwards ?Number of Stairs: 3 ?General stair comments: Pt demonstrated proper hand placement on seated surface for safety. ? ?Wheelchair Mobility ?  ? ?Modified Rankin (Stroke Patients Only) ?  ? ?  ? ?Balance Overall balance assessment: Mild deficits observed, not formally tested ?  ?  ?  ?  ?  ?  ?  ?  ?  ?  ?  ?  ?  ?  ?  ?  ?  ?  ?   ? ? ? ?Pertinent Vitals/Pain Pain Assessment ?Pain Assessment: Faces ?Faces Pain Scale: Hurts Vowels more ?Pain Location: back ?Pain Descriptors / Indicators: Operative site guarding, Sore ?Pain Intervention(s): Limited activity within patient's tolerance, Monitored during session, Repositioned  ? ? ?Home Living Family/patient expects to be discharged to:: Private residence ?Living Arrangements: Spouse/significant other ?  Available Help at Discharge: Available 24 hours/day ?Type of Home: House ?Home Access: Stairs to enter ?Entrance Stairs-Rails: Left;Right ?Entrance  Stairs-Number of Steps: 3 ?  ?Home Layout: One level ?Home Equipment: Conservation officer, nature (2 wheels);Cane - single point ?Additional Comments: has elevated toilet and uses the counter next to it. Lives with wife that has "disabilities' per patient. wife present entire session  ?  ?Prior Function Prior Level of Function : Independent/Modified Independent ?  ?  ?  ?  ?  ?  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  ? ? ?Hand Dominance  ? Dominant Hand: Right ? ?  ?Extremity/Trunk Assessment  ? Upper Extremity Assessment ?Upper Extremity Assessment: Defer to OT evaluation ?  ? ?Lower Extremity Assessment ?Lower Extremity Assessment: RLE deficits/detail ?RLE Deficits / Details: Decreased strength and ROM consistent with baseline knee issues (TKR and injury to it post-op) ?LLE Deficits / Details: reports possible need for surgery but they decided to operate on the back first. pt reports neuropathy because of back not vascular ?  ? ?Cervical / Trunk Assessment ?Cervical / Trunk Assessment: Neck Surgery;Back Surgery  ?Communication  ? Communication: No difficulties  ?Cognition Arousal/Alertness: Awake/alert ?Behavior During Therapy: Impulsive ?Overall Cognitive Status: Impaired/Different from baseline ?Area of Impairment: Safety/judgement ?  ?  ?  ?  ?  ?  ?  ?  ?  ?  ?  ?  ?Safety/Judgement: Decreased awareness of safety ?  ?  ?  ?  ?  ? ?  ?General Comments   ? ?  ?Exercises    ? ?Assessment/Plan  ?  ?PT Assessment Patient does not need any further PT services  ?PT Problem List   ? ?   ?  ?PT Treatment Interventions     ? ?PT Goals (Current goals can be found in the Care Plan section)  ?Acute Rehab PT Goals ?Patient Stated Goal: Home today ?PT Goal Formulation: All assessment and education complete, DC therapy ? ?  ?Frequency   ?  ? ? ?Co-evaluation   ?  ?  ?  ?  ? ? ?  ?AM-PAC PT "6 Clicks" Mobility  ?Outcome Measure Help needed turning  from your back to your side while in a flat bed without using bedrails?: None ?Help needed moving from lying on your back to sitting on the side of a flat bed without using bedrails?: None ?Help needed moving to and from a bed to a chair (including a wheelchair)?: None ?Help needed standing up from a chair using your arms (e.g., wheelchair or bedside chair)?: None ?Help needed to walk in hospital room?: None ?Help needed climbing 3-5 steps with a railing? : None ?6 Click Score: 24 ? ?  ?End of Session Equipment Utilized During Treatment: Back brace ?Activity Tolerance: Patient tolerated treatment well ?Patient left: in chair;with call bell/phone within reach;with family/visitor present ?Nurse Communication: Mobility status ?PT Visit Diagnosis: Unsteadiness on feet (R26.81);Pain ?Pain - part of body:  (back) ?  ? ?Time: 1517-6160 ?PT Time Calculation (min) (ACUTE ONLY): 13 min ? ? ?Charges:   PT Evaluation ?$PT Eval Low Complexity: 1 Low ?  ?  ?   ? ? ?Rolinda Roan, PT, DPT ?Acute Rehabilitation Services ?Secure Chat Preferred ?Office: [phone]  ? ?Thelma Comp ?06/01/2021, 1:24 PM ? ?

## 2021-06-01 NOTE — Evaluation (Signed)
Occupational Therapy Evaluation ?Patient Details ?Name: Victor [name] ?MRN: [id number] ?DOB: 09/24/1955 ?Today's Date: 06/01/2021 ? ? ?History of Present Illness 66 yo male s/p PLIF L4-5 L5-S1 interbody cages with autograft and demineralized bone matrix PMH: chronic back pain, anxiety, back sxs and cervical sxs. DDD, fibromyalgia, HTN, neuropathy bil feet, PVD sleep apnea, R TKA, BIL TSA  ? ?Clinical Impression ?  ?Patient is s/p PLIF L4-5 L5-S1 surgery resulting in functional limitations due to the deficits listed below (see OT problem list). Pt educated on sitting for adls to decrease fall risk. Pt expressing concerns over pain management and need for dilaudid. Pt will have wife (A) upon d/c home  Patient will benefit from skilled OT acutely to increase independence and safety with ADLS to allow discharge home no follow up. ?  ?   ? ?Recommendations for follow up therapy are one component of a multi-disciplinary discharge planning process, led by the attending physician.  Recommendations may be updated based on patient status, additional functional criteria and insurance authorization.  ? ?Follow Up Recommendations ? No OT follow up  ?  ?Assistance Recommended at Discharge PRN  ?Patient can return home with the following   ? ?  ?Functional Status Assessment ? Patient has had a recent decline in their functional status and demonstrates the ability to make significant improvements in function in a reasonable and predictable amount of time.  ?Equipment Recommendations ?    ?  ?Recommendations for Other Services   ? ? ?  ?Precautions / Restrictions Precautions ?Precautions: Back ?Precaution Comments: handout provided and reviewed again for back precautions ?Required Braces or Orthoses: Spinal Brace ?Spinal Brace: Lumbar corset;Applied in standing position  ? ?  ? ?Mobility Bed Mobility ?Overal bed mobility: Modified Independent ?  ?  ?  ?  ?  ?  ?General bed mobility comments: has electric bed at home so allowed  to elevate HOB but bed rail removed. ?  ? ?Transfers ?Overall transfer level: Modified independent ?  ?  ?  ?  ?  ?  ?  ?  ?  ?  ? ?  ?Balance Overall balance assessment: Mild deficits observed, not formally tested (reaching for environmental support during session) ?  ?  ?  ?  ?  ?  ?  ?  ?  ?  ?  ?  ?  ?  ?  ?  ?  ?  ?   ? ?ADL either performed or assessed with clinical judgement  ? ?ADL Overall ADL's : At baseline ?  ?  ?  ?  ?  ?  ?  ?  ?  ?  ?  ?  ?  ?  ?  ?  ?  ?  ?  ?General ADL Comments: pt able to dress with min cues for precautions. pt advised to sit and not stand and step into clothing. pt with decreased balance and then sitting to follow recommendation. pt reports poor sleep since surg  ? ? ? ?Vision Baseline Vision/History: 1 Wears glasses (reading only) ?   ?   ?Perception   ?  ?Praxis   ?  ? ?Pertinent Vitals/Pain Pain Assessment ?Pain Assessment: 0-10 ?Pain Score: 8  ?Pain Location: back- pt reports baseline even with medication he is 3-4 everyday ?Pain Descriptors / Indicators: Operative site guarding ?Pain Intervention(s): Monitored during session, Repositioned  ? ? ? ?Hand Dominance Right ?  ?Extremity/Trunk Assessment Upper Extremity Assessment ?Upper Extremity Assessment: RUE deficits/detail;LUE deficits/detail ?RUE  Deficits / Details: decreased shoulder flexion after shoulder surg. Reports only going to two follow up appointments after procedure adn doing HEP instead ?LUE Deficits / Details: decreased shoulder flexion after shoulder surg. Reports only going to two follow up appointments after procedure adn doing HEP instead ?  ?Lower Extremity Assessment ?Lower Extremity Assessment: RLE deficits/detail;LLE deficits/detail ?RLE Deficits / Details: decreased flexion for dressing and advised to dress first ?LLE Deficits / Details: reports possible need for surgery but they decided to operate on the back first. pt reports neuropathy because of back not vascular ?  ?Cervical / Trunk  Assessment ?Cervical / Trunk Assessment: Neck Surgery;Back Surgery ?  ?Communication Communication ?Communication: No difficulties ?  ?Cognition Arousal/Alertness: Awake/alert ?Behavior During Therapy: Impulsive, Agitated ?Overall Cognitive Status: Impaired/Different from baseline ?Area of Impairment: Safety/judgement ?  ?  ?  ?  ?  ?  ?  ?  ?  ?  ?  ?  ?Safety/Judgement: Decreased awareness of safety ?  ?  ?General Comments: pt expressed "i know what to do" ?  ?  ?General Comments  dressing dry and intact. visualized with wife during session. recommendation for washing around incision to decrease infection ? ?  ?Exercises   ?  ?Shoulder Instructions    ? ? ?Home Living Family/patient expects to be discharged to:: Private residence ?Living Arrangements: Spouse/significant other ?Available Help at Discharge: Available 24 hours/day ?Type of Home: House ?Home Access: Stairs to enter ?Entrance Stairs-Number of Steps: 3 ?Entrance Stairs-Rails: Left;Right ?Home Layout: One level ?  ?  ?Bathroom Shower/Tub: Tub/shower unit ?  ?Bathroom Toilet: Standard ?  ?  ?Home Equipment: Conservation officer, nature (2 wheels);Cane - single point ?  ?Additional Comments: has elevated toilet and uses the counter next to it. Lives with wife that has "disabilities' per patient. wife present entire session ?  ? ?  ?Prior Functioning/Environment   ?  ?  ?  ?  ?  ?  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  ? ?  ?  ?OT Problem List: Impaired balance (sitting and/or standing);Decreased safety awareness;Decreased knowledge of use of DME or AE;Decreased knowledge of precautions;Cardiopulmonary status limiting activity;Decreased activity tolerance;Decreased range of motion;Decreased cognition ?  ?   ?OT Treatment/Interventions: Self-care/ADL training;Therapeutic exercise;Energy conservation;DME and/or AE instruction;Therapeutic activities;Patient/family education;Balance  training  ?  ?OT Goals(Current goals can be found in the care plan section) Acute Rehab OT Goals ?Patient Stated Goal: to have pain medication adjusted. ?OT Goal Formulation: With patient ?Time For Goal Achievement: 06/15/21 ?Potential to Achieve Goals: Good  ?OT Frequency: Min 2X/week ?  ? ?Co-evaluation   ?  ?  ?  ?  ? ?  ?AM-PAC OT "6 Clicks" Daily Activity     ?Outcome Measure Help from another person eating meals?: A Hsia ?Help from another person taking care of personal grooming?: A Sotelo ?Help from another person toileting, which includes using toliet, bedpan, or urinal?: A Shinn ?Help from another person bathing (including washing, rinsing, drying)?: A Schwer ?Help from another person to put on and taking off regular upper body clothing?: A Peace ?Help from another person to put on and taking off regular lower body clothing?: A Bua ?6 Click Score: 18 ?  ?End of Session Nurse Communication: Mobility status;Precautions ? ?Activity Tolerance: Patient tolerated treatment well ?Patient left: Other (comment) (standing with Dr Annette Stable Arriving to room) ? ?OT Visit Diagnosis: Muscle weakness (generalized) (M62.81)  ?              ?  Time: 3094-0768 ?OT Time Calculation (min): 27 min ?Charges:  OT General Charges ?$OT Visit: 1 Visit ?OT Evaluation ?$OT Eval Moderate Complexity: 1 Mod ?OT Treatments ?$Self Care/Home Management : 8-22 mins ? ? ?Brynn, OTR/L  ?Acute Rehabilitation Services ?Pager: [phone] ?Office: [phone] ?. ? ? ?Jeri Modena ?06/01/2021, 9:43 AM ?

## 2021-06-02 MED FILL — Sodium Chloride IV Soln 0.9%: INTRAVENOUS | Qty: 1000 | Status: AC

## 2021-06-02 MED FILL — Heparin Sodium (Porcine) Inj 1000 Unit/ML: INTRAMUSCULAR | Qty: 30 | Status: AC

## 2021-06-08 DIAGNOSIS — I10 Essential (primary) hypertension: Secondary | ICD-10-CM | POA: Diagnosis not present

## 2021-06-08 DIAGNOSIS — J209 Acute bronchitis, unspecified: Secondary | ICD-10-CM | POA: Diagnosis not present

## 2021-06-14 ENCOUNTER — Other Ambulatory Visit: Payer: Self-pay | Admitting: Physician Assistant

## 2021-06-15 NOTE — Telephone Encounter (Signed)
Next Visit: 11/25/2021 ? ?Last Visit: 05/25/2021 ? ?Last Fill: 03/11/2021 ? ?DX: Idiopathic chronic gout of multiple sites without tophus  ? ?Current Dose per office note 05/25/2021: allopurinol 450 mg po daily ? ?Labs: 05/25/2021 Glucose 123, Uric Acid 4.5 ? ?Okay to refill Allopurinol?  ?

## 2021-06-24 DIAGNOSIS — H18413 Arcus senilis, bilateral: Secondary | ICD-10-CM | POA: Diagnosis not present

## 2021-06-24 DIAGNOSIS — H02831 Dermatochalasis of right upper eyelid: Secondary | ICD-10-CM | POA: Diagnosis not present

## 2021-06-24 DIAGNOSIS — Z961 Presence of intraocular lens: Secondary | ICD-10-CM | POA: Diagnosis not present

## 2021-06-24 DIAGNOSIS — H26492 Other secondary cataract, left eye: Secondary | ICD-10-CM | POA: Diagnosis not present

## 2021-06-29 DIAGNOSIS — M4310 Spondylolisthesis, site unspecified: Secondary | ICD-10-CM | POA: Diagnosis not present

## 2021-06-30 DIAGNOSIS — Z9842 Cataract extraction status, left eye: Secondary | ICD-10-CM | POA: Diagnosis not present

## 2021-08-03 DIAGNOSIS — M4310 Spondylolisthesis, site unspecified: Secondary | ICD-10-CM | POA: Diagnosis not present

## 2021-08-22 DIAGNOSIS — M1712 Unilateral primary osteoarthritis, left knee: Secondary | ICD-10-CM | POA: Diagnosis not present

## 2021-08-22 DIAGNOSIS — M25562 Pain in left knee: Secondary | ICD-10-CM | POA: Diagnosis not present

## 2021-08-22 DIAGNOSIS — Z96651 Presence of right artificial knee joint: Secondary | ICD-10-CM | POA: Diagnosis not present

## 2021-09-15 ENCOUNTER — Other Ambulatory Visit: Payer: Self-pay | Admitting: Rheumatology

## 2021-09-15 NOTE — Telephone Encounter (Signed)
Next Visit: 11/25/2021  Last Visit: 05/25/2021  Last Fill: 06/15/2021  DX: Idiopathic chronic gout of multiple sites without tophus -  Current Dose per office note 05/25/2021: allopurinol 450 mg po daily   Labs: 05/25/2021 uric acid Uric acid is in desirable range.  No change in treatment advised, CBC WNL, BMP Glucose 123,   Okay to refill allopurinol?

## 2021-09-28 DIAGNOSIS — M4310 Spondylolisthesis, site unspecified: Secondary | ICD-10-CM | POA: Diagnosis not present

## 2021-10-19 DIAGNOSIS — U071 COVID-19: Secondary | ICD-10-CM | POA: Diagnosis not present

## 2021-11-02 DIAGNOSIS — Z96611 Presence of right artificial shoulder joint: Secondary | ICD-10-CM | POA: Diagnosis not present

## 2021-11-07 DIAGNOSIS — I10 Essential (primary) hypertension: Secondary | ICD-10-CM | POA: Diagnosis not present

## 2021-11-07 DIAGNOSIS — J019 Acute sinusitis, unspecified: Secondary | ICD-10-CM | POA: Diagnosis not present

## 2021-11-07 DIAGNOSIS — U071 COVID-19: Secondary | ICD-10-CM | POA: Diagnosis not present

## 2021-11-09 DIAGNOSIS — G8929 Other chronic pain: Secondary | ICD-10-CM | POA: Diagnosis not present

## 2021-11-09 DIAGNOSIS — M5442 Lumbago with sciatica, left side: Secondary | ICD-10-CM | POA: Diagnosis not present

## 2021-11-09 DIAGNOSIS — M5441 Lumbago with sciatica, right side: Secondary | ICD-10-CM | POA: Diagnosis not present

## 2021-11-09 DIAGNOSIS — Z9889 Other specified postprocedural states: Secondary | ICD-10-CM | POA: Diagnosis not present

## 2021-11-09 DIAGNOSIS — G894 Chronic pain syndrome: Secondary | ICD-10-CM | POA: Diagnosis not present

## 2021-11-10 ENCOUNTER — Other Ambulatory Visit: Payer: Self-pay | Admitting: Family Medicine

## 2021-11-10 DIAGNOSIS — R7303 Prediabetes: Secondary | ICD-10-CM | POA: Diagnosis not present

## 2021-11-10 DIAGNOSIS — G4700 Insomnia, unspecified: Secondary | ICD-10-CM | POA: Diagnosis not present

## 2021-11-10 DIAGNOSIS — J309 Allergic rhinitis, unspecified: Secondary | ICD-10-CM | POA: Diagnosis not present

## 2021-11-10 DIAGNOSIS — Z0000 Encounter for general adult medical examination without abnormal findings: Secondary | ICD-10-CM | POA: Diagnosis not present

## 2021-11-10 DIAGNOSIS — E782 Mixed hyperlipidemia: Secondary | ICD-10-CM | POA: Diagnosis not present

## 2021-11-10 DIAGNOSIS — Z136 Encounter for screening for cardiovascular disorders: Secondary | ICD-10-CM

## 2021-11-10 DIAGNOSIS — J019 Acute sinusitis, unspecified: Secondary | ICD-10-CM | POA: Diagnosis not present

## 2021-11-10 DIAGNOSIS — M109 Gout, unspecified: Secondary | ICD-10-CM | POA: Diagnosis not present

## 2021-11-10 DIAGNOSIS — M542 Cervicalgia: Secondary | ICD-10-CM | POA: Diagnosis not present

## 2021-11-10 DIAGNOSIS — I10 Essential (primary) hypertension: Secondary | ICD-10-CM | POA: Diagnosis not present

## 2021-11-15 NOTE — Progress Notes (Signed)
Office Visit Note  Patient: Victor [name]             Date of Birth: 1955/03/29           MRN: [id number]             PCP: Antony Contras, MD Referring: Antony Contras, MD Visit Date: 11/29/2021 Occupation: '@GUAROCC'$ @  Subjective:  Pain in multiple joints  History of Present Illness: Victor [name] is a 66 y.o. male with history of gout, osteoarthritis and degenerative disc disease.  He has been taking allopurinol 4050 mg p.o. daily.  He takes colchicine only on as needed basis.  He has not had a gout flare.  He continues to have pain and discomfort in his bilateral shoulders which has been replaced.  He has pain and stiffness in his bilateral hands.  He has discomfort in his bilateral knee joints.  The right knee joint has been replaced and the left needs replacement per patient.  He has discomfort in his feet.  He has chronic discomfort in his neck and lower back.  He has been followed by neurosurgery and pain management.  He has generalized pain and discomfort from fibromyalgia.  Activities of Daily Living:  Patient reports morning stiffness for 15 minutes.   Patient Reports nocturnal pain.  Difficulty dressing/grooming: Reports Difficulty climbing stairs: Denies Difficulty getting out of chair: Denies Difficulty using hands for taps, buttons, cutlery, and/or writing: Reports  Review of Systems  Constitutional:  Positive for fatigue.  HENT:  Negative for mouth sores and mouth dryness.   Eyes:  Positive for dryness.  Respiratory:  Negative for shortness of breath.   Cardiovascular:  Negative for chest pain and palpitations.  Gastrointestinal:  Negative for blood in stool, constipation and diarrhea.  Endocrine: Negative for increased urination.  Genitourinary:  Negative for involuntary urination.  Musculoskeletal:  Positive for joint pain, gait problem, joint pain, joint swelling, myalgias, morning stiffness, muscle tenderness and myalgias. Negative for muscle weakness.   Skin:  Positive for color change. Negative for rash and sensitivity to sunlight.  Allergic/Immunologic: Negative for susceptible to infections.  Neurological:  Negative for dizziness and headaches.  Hematological:  Negative for swollen glands.  Psychiatric/Behavioral:  Positive for sleep disturbance. Negative for depressed mood. The patient is nervous/anxious.     PMFS History:  Patient Active Problem List   Diagnosis Date Noted   Degenerative spondylolisthesis 05/31/2021   Cervical radiculopathy 05/24/2020   Hypertension    Neuropathy of both feet    Lumbar foraminal stenosis 08/06/2017   HNP (herniated nucleus pulposus), thoracic 09/05/2016   Chronic kidney disease (CKD) 04/26/2016   DJD (degenerative joint disease), cervical 04/26/2016   Essential hypertension 04/26/2016   Dyslipidemia 04/26/2016   Sleep apnea 04/26/2016   Fibromyalgia 04/18/2016   Idiopathic chronic gout of multiple sites without tophus 04/18/2016   Spondylosis of lumbar region without myelopathy or radiculopathy 04/18/2016   Primary osteoarthritis of both knees 04/18/2016   Lumbar herniated disc 07/22/2015   Morbid obesity ([place]) 02/25/2014   S/P right TKA 02/24/2014   Heartburn 01/10/2011    Past Medical History:  Diagnosis Date   Anxiety    Arthritis    "all over" (04/15/2013)   Back pain, chronic    Chronic neck pain    DDD (degenerative disc disease), cervical    DDD (degenerative disc disease), lumbar    Fibromyalgia    GERD (gastroesophageal reflux disease)    Gout    Headache  High cholesterol    History of bronchitis    Hypertension    Leg cramps    Right greater than left   Melanoma (HCC)    Nose   MRSA infection    Neuropathy of both feet    Peripheral vascular disease (HCC)    Pre-diabetes    Radiculopathy of lumbar region    Sleep apnea    "don't use CPAP" (04/15/2013)    Family History  Problem Relation Age of Onset   Hypertension Father    Cancer Mother        breast     Heart Problems Mother    Alzheimer's disease Mother    Allergies Daughter    Interstitial cystitis Daughter    Past Surgical History:  Procedure Laterality Date   ANTERIOR CERVICAL DECOMP/DISCECTOMY FUSION  2000's X3   "had 3 surgeries on my front neck" (04/15/2013)   ANTERIOR LAT LUMBAR FUSION Left 08/06/2017   Procedure: LEFT LUMBAR TWO-THREE ANTERIOR LATERAL LUMBAR FUSION WITH PERCUTANEOUS SCREWS;  Surgeon: Earnie Larsson, MD;  Location: [place];  Service: Neurosurgery;  Laterality: Left;   APPENDECTOMY  ~ 2010   BACK SURGERY     CARPAL TUNNEL RELEASE Left 1990's   CATARACT EXTRACTION, BILATERAL     COLONOSCOPY     ear drum surgery  1974   EYE SURGERY Right    FLEXIBLE SIGMOIDOSCOPY  12/21/2011   Procedure: FLEXIBLE SIGMOIDOSCOPY;  Surgeon: Lear Ng, MD;  Location: [facility];  Service: Endoscopy;  Laterality: N/A;   HAND TENDON SURGERY Left ~ 1978 X2-1980's X3   "had 5 ORs after laceration"   HOT HEMOSTASIS  12/21/2011   Procedure: HOT HEMOSTASIS (ARGON PLASMA COAGULATION/BICAP);  Surgeon: Lear Ng, MD;  Location: [facility];  Service: Endoscopy;  Laterality: N/A;   INGUINAL HERNIA REPAIR Left 04/15/2013   Procedure: LAPAROSCOPIC REPAIR OF INCARCERATED LEFT INGUINAL HERNIA;  Surgeon: Gayland Curry, MD;  Location: [place];  Service: General;  Laterality: Left;   INGUINAL HERNIA REPAIR Bilateral 1970's - [place] Left 04/15/2013   INSERTION OF MESH Left 04/15/2013   Procedure: INSERTION OF MESH;  Surgeon: Gayland Curry, MD;  Location: [place];  Service: General;  Laterality: Left;   JOINT REPLACEMENT     right knee   KNEE ARTHROSCOPY Right 1990's   LAPAROSCOPY N/A 04/15/2013   Procedure: LAPAROSCOPY DIAGNOSTIC;  Surgeon: Gayland Curry, MD;  Location: [place];  Service: General;  Laterality: N/A;   LUMBAR LAMINECTOMY/DECOMPRESSION MICRODISCECTOMY Right 07/22/2015   Procedure: Right Lumbar Two-Three Microdiscectomy;  Surgeon: Earnie Larsson, MD;   Location: [facility];  Service: Neurosurgery;  Laterality: Right;   LUMBAR LAMINECTOMY/DECOMPRESSION MICRODISCECTOMY Right 09/05/2016   Procedure: Microdiscectomy - right - T12-L1;  Surgeon: Earnie Larsson, MD;  Location: [place];  Service: Neurosurgery;  Laterality: Right;   NASAL SEPTUM SURGERY Bilateral 2014   "removed polyps & infection"   POLYPECTOMY  01/10/2011   Procedure: POLYPECTOMY;  Surgeon: Lear Ng, MD;  Location: [facility];  Service: Endoscopy;  Laterality: N/A;   POSTERIOR CERVICAL LAMINECTOMY Left 05/24/2020   Procedure: Laminectomy and Foraminotomy - left - Cervical two-Cervical three;  Surgeon: Earnie Larsson, MD;  Location: [place];  Service: Neurosurgery;  Laterality: Left;   POSTERIOR LUMBAR FUSION  11/1995   "ray cages" (04/15/2013)   SHOULDER ARTHROSCOPY Bilateral    "twice each"   TONSILLECTOMY  1960's   TOTAL KNEE ARTHROPLASTY Right 02/24/2014   Procedure: TOTAL RIGHT  KNEE ARTHROPLASTY;  Surgeon: Mauri Pole, MD;  Location: [facility];  Service: Orthopedics;  Laterality: Right;   TOTAL SHOULDER ARTHROPLASTY Left 09/18/2019   Procedure: TOTAL SHOULDER ARTHROPLASTY;  Surgeon: Justice Britain, MD;  Location: [facility];  Service: Orthopedics;  Laterality: Left;  116mn   TOTAL SHOULDER ARTHROPLASTY Right 11/04/2020   Procedure: TOTAL SHOULDER ARTHROPLASTY;  Surgeon: SJustice Britain MD;  Location: [facility];  Service: Orthopedics;  Laterality: Right;  1266m   UPPER GI ENDOSCOPY     VASECTOMY  1988   WISDOM TOOTH EXTRACTION  2000's   Social History   Social History Narrative   Not on file   Immunization History  Administered Date(s) Administered   Influenza Whole 11/08/2011   Influenza-Unspecified 02/27/2013   PFIZER(Purple Top)SARS-COV-2 Vaccination 06/23/2019, 07/14/2019, 09/22/2020     Objective: Vital Signs: BP (!) 148/98 (BP Location: Left Arm, Patient Position: Sitting, Cuff Size: Normal)   Pulse 79   Resp 19   Ht '5\' 8"'$  (1.727 m)   Wt 221 lb (100.2 kg)    BMI 33.60 kg/m    Physical Exam Vitals and nursing note reviewed.  Constitutional:      Appearance: He is well-developed.  HENT:     Head: Normocephalic and atraumatic.  Eyes:     Conjunctiva/sclera: Conjunctivae normal.     Pupils: Pupils are equal, round, and reactive to light.  Cardiovascular:     Rate and Rhythm: Normal rate and regular rhythm.     Heart sounds: Normal heart sounds.  Pulmonary:     Effort: Pulmonary effort is normal.     Breath sounds: Normal breath sounds.  Abdominal:     General: Bowel sounds are normal.     Palpations: Abdomen is soft.  Musculoskeletal:     Cervical back: Normal range of motion and neck supple.  Skin:    General: Skin is warm and dry.     Capillary Refill: Capillary refill takes less than 2 seconds.  Neurological:     Mental Status: He is alert and oriented to person, place, and time.  Psychiatric:        Behavior: Behavior normal.      Musculoskeletal Exam: He had limited range of motion of his cervical spine.  He had limited painful range of motion of the thoracic and lumbar spine.  Shoulder joints were replaced and were in good range of motion with discomfort.  Elbow joints in good range of motion.  He had good range of motion of his wrist joints.  He had thickening of his right third MCP joint without synovitis.  PIP and DIP thickening was noted bilaterally consistent with osteoarthritis.  He had good range of motion of bilateral hip joints.  His right knee joint was replaced without any warmth swelling or effusion.  Left knee joint was in good range of motion without any warmth swelling or effusion.  He had no tenderness over ankles and MTPs.  CDAI Exam: CDAI Score: -- Patient Global: --; Provider Global: -- Swollen: --; Tender: -- Joint Exam 11/29/2021   No joint exam has been documented for this visit   There is currently no information documented on the homunculus. Go to the Rheumatology activity and complete the homunculus  joint exam.  Investigation: No additional findings.  Imaging: CT CARDIAC SCORING (SELF PAY ONLY)  Addendum Date: 11/28/2021   ADDENDUM REPORT: 11/28/2021 16:01 EXAM: OVER-READ INTERPRETATION  CT CHEST The following report is a limited chest CT over-read performed by radiologist Dr. KyMarylyn Ishihara  [facility] of [facility], [place] on 11/28/2021. This over-read does not include interpretation of cardiac or coronary anatomy or pathology. The calcium score interpretation by the cardiologist is attached. COMPARISON:  None Available. FINDINGS: Vascular: Aortic atherosclerosis. Upper normal to borderline dilated ascending aorta, 4.0 cm on transverse image 30/4 and 53 coronal. Mediastinum/Nodes: No imaged thoracic adenopathy. Lungs/Pleura: No pleural fluid.  Clear imaged lungs. Upper Abdomen: Normal imaged portions of the liver, spleen, stomach. Musculoskeletal: Remote posterior left rib fractures. Advanced lower thoracic spondylosis. IMPRESSION: No acute findings in the imaged extracardiac chest. Borderline to mildly dilated ascending aorta at 4.0 cm. Recommend annual imaging followup by CTA or MRA. This recommendation follows 2010 ACCF/AHA/AATS/ACR/ASA/SCA/SCAI/SIR/STS/SVM Guidelines for the Diagnosis and Management of Patients with Thoracic Aortic Disease. Circulation. 2010; 121: L875-I433. Aortic aneurysm NOS (ICD10-I71.9) Electronically Signed   By: Abigail Miyamoto M.D.   On: 11/28/2021 16:01   Result Date: 11/28/2021 CLINICAL DATA:  Cardiovascular Disease Risk stratification EXAM: Coronary Calcium Score TECHNIQUE: A gated, non-contrast computed tomography scan of the heart was performed using 60m slice thickness. Axial images were analyzed on a dedicated workstation. Calcium scoring of the coronary arteries was performed using the Agatston method. FINDINGS: Coronary arteries: Normal origins. Coronary Calcium Score: Left main: 0 Left anterior descending artery: 462 Left circumflex artery: 651 Right coronary artery: 1422  Total: 2535 Percentile: 98 Pericardium: Normal. Ascending Aorta: Mildly dilated ascending aorta - 43 mm Non-cardiac: [facility]. IMPRESSION: 1. Coronary calcium score of 2535. This was 944percentile for age-, race-, and sex-matched controls. 2.  Mildly dilated ascending aorta - 43 mm RECOMMENDATIONS: Coronary artery calcium (CAC) score is a strong predictor of incident coronary heart disease (CHD) and provides predictive information beyond traditional risk factors. CAC scoring is reasonable to use in the decision to withhold, postpone, or initiate statin therapy in intermediate-risk or selected borderline-risk asymptomatic adults (age 66-75years and LDL-C >=70 to <190 mg/dL) who do not have diabetes or established atherosclerotic cardiovascular disease (ASCVD).* In intermediate-risk (10-year ASCVD risk >=7.5% to <20%) adults or selected borderline-risk (10-year ASCVD risk >=5% to <7.5%) adults in whom a CAC score is measured for the purpose of making a treatment decision the following recommendations have been made: If CAC=0, it is reasonable to withhold statin therapy and reassess in 5 to 10 years, as long as higher risk conditions are absent (diabetes mellitus, family history of premature CHD in first degree relatives (males <55 years; females <65 years), cigarette smoking, or LDL >=190 mg/dL). If CAC is 1 to 99, it is reasonable to initiate statin therapy for patients >=573years of age. If CAC is >=100 or >=75th percentile, it is reasonable to initiate statin therapy at any age. Cardiology referral should be considered for patients with CAC scores >=400 or >=75th percentile. *2018 AHA/ACC/AACVPR/AAPA/ABC/ACPM/ADA/AGS/APhA/ASPC/NLA/PCNA Guideline on the Management of Blood Cholesterol: A Report of the American College of Cardiology/American Heart Association Task Force on Clinical Practice Guidelines. J Am Coll Cardiol. 2019;73(24):3168-3209. Electronically Signed: By: MCandee FurbishM.D. On: 11/28/2021 14:51   [place] SCRE[place]ING  Result Date: 11/18/2021 CLINICAL DATA:  Screening for abdominal aortic aneurysm 66year old male with smoking history EXAM: [place] SCREENING TECHNIQUE: Ultrasound examination of the abdominal aorta was performed as a screening evaluation for abdominal aortic aneurysm. COMPARISON:  CT abdomen and pelvis 07/19/2016 FINDINGS: Abdominal aortic measurements as follows: Proximal:  2.4 x 2.4 cm cm Mid:  2.1 x 2.3 cm cm Distal:  2.0 x 2.4 cm  cm IMPRESSION: Negative for abdominal aortic aneurysm. Electronically Signed   By: Placido Sou M.D.   On: 11/18/2021 22:10    Recent Labs: Lab Results  Component Value Date   WBC 6.8 05/25/2021   HGB 15.6 05/25/2021   PLT 238 05/25/2021   NA 136 05/25/2021   K 4.9 05/25/2021   CL 103 05/25/2021   CO2 26 05/25/2021   GLUCOSE 123 (H) 05/25/2021   BUN 12 05/25/2021   CREATININE 0.98 05/25/2021   BILITOT 1.2 05/20/2020   ALKPHOS 46 05/20/2020   AST 38 05/20/2020   ALT 32 05/20/2020   PROT 7.2 05/20/2020   ALBUMIN 4.0 05/20/2020   CALCIUM 9.3 05/25/2021   GFRAA >60 09/09/2019    November 10, 2021 uric acid 4.8, CBC WBC 6.6, hemoglobin 15.6, platelets 252  Speciality Comments: No specialty comments available.  Procedures:  No procedures performed Allergies: Tetracyclines & related, Doxycycline, and Niacin and related   Assessment / Plan:     Visit Diagnoses: Idiopathic chronic gout of multiple sites without tophus - allopurinol 450 mg po daily and colchicine 0.6 mg 1 tablet by mouth BID as needed.  He gives history of intermittent discomfort in his joints.  He takes colchicine on.  Basis.  He brought labs done from November 10, 2021, uric acid was 4.8 and CBC was normal.  BMP was normal on May 25, 2021.  No change in treatment advised.  Status post shoulder replacement, right - November 04, 2020-Dr. Supple.  He has some chronic discomfort.  Status post  shoulder replacement, left - July 22,2021 by Dr. Onnie Graham.  Chronic discomfort.  Primary osteoarthritis of both hands-bowel Ro PIP and DIP thickening was noted.  No synovitis was noted.  Joint protection was discussed.  Iliotibial band syndrome of left side-IT band stretches were discussed.  Primary osteoarthritis of left knee-patient is followed by Dr. Onnie Graham.  According to him he will need left total knee replacement in the future.  He lives in chronic discomfort.  History of total right knee replacement (TKR)-no swelling or effusion was noted.  Primary osteoarthritis of both feet-is chronic discomfort.  Proper fitting shoes were advised.  DDD (degenerative disc disease), cervical-he has a stiffness and discomfort range of motion of the cervical spine especially to the right lateral rotation.  Spondylosis of lumbar region without myelopathy or radiculopathy - he is followed by neurosurgery.  Victor [name] has generalized pain and discomfort.  He goes to pain management.  History of hypertension-blood pressure was elevated today.  He was advised to monitor blood pressure closely and follow-up with his PCP.  Other medical problems are listed as follows:  History of gastroesophageal reflux (GERD)  History of hypercholesterolemia  History of sleep apnea  History of hiatal hernia  History of insomnia  Orders: No orders of the defined types were placed in this encounter.  No orders of the defined types were placed in this encounter.    Follow-Up Instructions: Return in about 6 months (around 05/31/2022) for Osteoarthritis, Gout.   Bo Merino, MD  Note - This record has been created using Editor, commissioning.  Chart creation errors have been sought, but may not always  have been located. Such creation errors do not reflect on  the standard of medical care.

## 2021-11-17 ENCOUNTER — Ambulatory Visit
Admission: RE | Admit: 2021-11-17 | Discharge: 2021-11-17 | Disposition: A | Discharge status: Home / Self Care | Payer: Medicare Other | Source: Ambulatory Visit | Attending: Family Medicine | Admitting: Family Medicine

## 2021-11-17 DIAGNOSIS — Z87891 Personal history of nicotine dependence: Secondary | ICD-10-CM | POA: Diagnosis not present

## 2021-11-17 DIAGNOSIS — Z136 Encounter for screening for cardiovascular disorders: Secondary | ICD-10-CM | POA: Diagnosis not present

## 2021-11-23 ENCOUNTER — Other Ambulatory Visit (HOSPITAL_BASED_OUTPATIENT_CLINIC_OR_DEPARTMENT_OTHER): Payer: Self-pay | Admitting: Family Medicine

## 2021-11-23 DIAGNOSIS — M5416 Radiculopathy, lumbar region: Secondary | ICD-10-CM | POA: Diagnosis not present

## 2021-11-24 DIAGNOSIS — M1712 Unilateral primary osteoarthritis, left knee: Secondary | ICD-10-CM | POA: Diagnosis not present

## 2021-11-25 ENCOUNTER — Ambulatory Visit: Payer: Medicare Other | Admitting: Rheumatology

## 2021-11-28 ENCOUNTER — Ambulatory Visit (HOSPITAL_BASED_OUTPATIENT_CLINIC_OR_DEPARTMENT_OTHER)
Admission: RE | Admit: 2021-11-28 | Discharge: 2021-11-28 | Disposition: A | Discharge status: Home / Self Care | Payer: Medicare Other | Source: Ambulatory Visit | Attending: Family Medicine | Admitting: Family Medicine

## 2021-11-28 DIAGNOSIS — E782 Mixed hyperlipidemia: Secondary | ICD-10-CM | POA: Insufficient documentation

## 2021-11-29 ENCOUNTER — Encounter: Payer: Self-pay | Admitting: Rheumatology

## 2021-11-29 ENCOUNTER — Ambulatory Visit: Payer: Medicare Other | Attending: Rheumatology | Admitting: Rheumatology

## 2021-11-29 VITALS — BP 148/98 | HR 79 | Resp 19 | Ht 68.0 in | Wt 221.0 lb

## 2021-11-29 DIAGNOSIS — M797 Fibromyalgia: Secondary | ICD-10-CM | POA: Diagnosis not present

## 2021-11-29 DIAGNOSIS — Z87898 Personal history of other specified conditions: Secondary | ICD-10-CM

## 2021-11-29 DIAGNOSIS — Z96612 Presence of left artificial shoulder joint: Secondary | ICD-10-CM

## 2021-11-29 DIAGNOSIS — M1A09X0 Idiopathic chronic gout, multiple sites, without tophus (tophi): Secondary | ICD-10-CM | POA: Diagnosis not present

## 2021-11-29 DIAGNOSIS — Z8719 Personal history of other diseases of the digestive system: Secondary | ICD-10-CM

## 2021-11-29 DIAGNOSIS — Z8679 Personal history of other diseases of the circulatory system: Secondary | ICD-10-CM

## 2021-11-29 DIAGNOSIS — M7632 Iliotibial band syndrome, left leg: Secondary | ICD-10-CM | POA: Diagnosis not present

## 2021-11-29 DIAGNOSIS — M19072 Primary osteoarthritis, left ankle and foot: Secondary | ICD-10-CM

## 2021-11-29 DIAGNOSIS — Z96611 Presence of right artificial shoulder joint: Secondary | ICD-10-CM

## 2021-11-29 DIAGNOSIS — M1712 Unilateral primary osteoarthritis, left knee: Secondary | ICD-10-CM

## 2021-11-29 DIAGNOSIS — Z8669 Personal history of other diseases of the nervous system and sense organs: Secondary | ICD-10-CM

## 2021-11-29 DIAGNOSIS — M19041 Primary osteoarthritis, right hand: Secondary | ICD-10-CM

## 2021-11-29 DIAGNOSIS — Z8639 Personal history of other endocrine, nutritional and metabolic disease: Secondary | ICD-10-CM

## 2021-11-29 DIAGNOSIS — M5030 Other cervical disc degeneration, unspecified cervical region: Secondary | ICD-10-CM

## 2021-11-29 DIAGNOSIS — M47816 Spondylosis without myelopathy or radiculopathy, lumbar region: Secondary | ICD-10-CM

## 2021-11-29 DIAGNOSIS — M19071 Primary osteoarthritis, right ankle and foot: Secondary | ICD-10-CM

## 2021-11-29 DIAGNOSIS — M19042 Primary osteoarthritis, left hand: Secondary | ICD-10-CM

## 2021-11-29 DIAGNOSIS — Z96651 Presence of right artificial knee joint: Secondary | ICD-10-CM | POA: Diagnosis not present

## 2021-11-30 DIAGNOSIS — M5416 Radiculopathy, lumbar region: Secondary | ICD-10-CM | POA: Diagnosis not present

## 2021-12-05 DIAGNOSIS — E875 Hyperkalemia: Secondary | ICD-10-CM | POA: Diagnosis not present

## 2021-12-07 DIAGNOSIS — M5416 Radiculopathy, lumbar region: Secondary | ICD-10-CM | POA: Diagnosis not present

## 2021-12-07 DIAGNOSIS — Z9889 Other specified postprocedural states: Secondary | ICD-10-CM | POA: Diagnosis not present

## 2021-12-09 DIAGNOSIS — Z23 Encounter for immunization: Secondary | ICD-10-CM | POA: Diagnosis not present

## 2021-12-20 DIAGNOSIS — R3912 Poor urinary stream: Secondary | ICD-10-CM | POA: Diagnosis not present

## 2021-12-27 ENCOUNTER — Ambulatory Visit: Payer: Medicare Other | Attending: Cardiovascular Disease | Admitting: Cardiovascular Disease

## 2021-12-27 ENCOUNTER — Encounter: Payer: Self-pay | Admitting: Cardiovascular Disease

## 2021-12-27 VITALS — BP 118/82 | HR 64 | Ht 68.0 in | Wt 219.6 lb

## 2021-12-27 DIAGNOSIS — I2089 Other forms of angina pectoris: Secondary | ICD-10-CM

## 2021-12-27 DIAGNOSIS — I2510 Atherosclerotic heart disease of native coronary artery without angina pectoris: Secondary | ICD-10-CM

## 2021-12-27 DIAGNOSIS — Z79899 Other long term (current) drug therapy: Secondary | ICD-10-CM

## 2021-12-27 DIAGNOSIS — I2584 Coronary atherosclerosis due to calcified coronary lesion: Secondary | ICD-10-CM | POA: Diagnosis not present

## 2021-12-27 DIAGNOSIS — E782 Mixed hyperlipidemia: Secondary | ICD-10-CM | POA: Diagnosis not present

## 2021-12-27 MED ORDER — ROSUVASTATIN CALCIUM 20 MG PO TABS: 20.0000 mg | ORAL_TABLET | Freq: Every day | ORAL | 3 refills | Status: DC

## 2021-12-27 NOTE — Progress Notes (Signed)
Cardiology Office Note:    Date:  12/27/2021   ID:  Victor [name], DOB 1955-03-08, MRN [id number]  PCP:  Antony Contras, [place] Providers Cardiologist:  Bryne Lindon    Referring MD: Antony Contras, MD   Chief Complaint  Patient presents with   Hyperlipidemia   coronary artery calcification    History of Present Illness:    Victor [name] is a 66 y.o. male with a hx of hyperlipidemia and calcifications of the aorta.  We are asked to see him today for further evaluation.  Seen with wife, Raffael Bugarin ( patient of mine)  He thinks I saw him for a pre op clearance in 1990s.  He was found to have some incidental calcium on his aorta.  He had a coronary calcium test which revealed a coronary calcium score of 2535 which places him in the 98th percentile for age/sex matched controls.  No chest pain  Has a funny feeling in his chest ,  central feeling .   Not necessarily with activity Has lots of feet , leg pain ,  Does not walk much ,  can do a stationary bike   Has occasional "sinking " spells - may be hypoglycemia  Feels better after drinking a coke .     His recent hyperlipidemia.  Recent lipids reveal Total cholesterol of 216 HDL is 42 Triglyceride level is 237 LDL is 132.  Was on Simvastatin but is no longer   Will starteon rosuvastatin check labs in 3 months.  Past Medical History:  Diagnosis Date   Allergic rhinitis    Anxiety    Arteriosclerosis of thoracic aorta (Stem)    Arthritis    "all over" (04/15/2013)   Back pain, chronic    BMI 32.0-32.9,adult    Cervicalgia    Chronic neck pain    COVID-19    DDD (degenerative disc disease), cervical    DDD (degenerative disc disease), lumbar    Diverticulosis    Elevated hemoglobin (HCC)    Fibromyalgia    GERD (gastroesophageal reflux disease)    Gout    Headache    High cholesterol    History of bronchitis    Hyperkalemia    Hypertension    Insomnia    Kidney disease  with benign hypertension    Leg cramps    Right greater than left   Low back pain    Low testosterone    Melanoma (HCC)    Nose   MRSA infection    Neuropathy of both feet    Peripheral vascular disease (HCC)    Personal history of colonic polyps    Polycythemia    Pre-diabetes    Radiculopathy of lumbar region    Raynaud phenomenon    Recurrent sinus infections    Right sided sciatica    Sleep apnea    "don't use CPAP" (04/15/2013)   Tobacco dependence     Past Surgical History:  Procedure Laterality Date   ANTERIOR CERVICAL DECOMP/DISCECTOMY FUSION  2000's X3   "had 3 surgeries on my front neck" (04/15/2013)   ANTERIOR LAT LUMBAR FUSION Left 08/06/2017   Procedure: LEFT LUMBAR TWO-THREE ANTERIOR LATERAL LUMBAR FUSION WITH PERCUTANEOUS SCREWS;  Surgeon: Earnie Larsson, MD;  Location: [place];  Service: Neurosurgery;  Laterality: Left;   APPENDECTOMY  ~ 2010   BACK SURGERY     CARPAL TUNNEL RELEASE Left 1990's   CATARACT EXTRACTION, BILATERAL     COLONOSCOPY  ear drum surgery  1974   EYE SURGERY Right    FLEXIBLE SIGMOIDOSCOPY  12/21/2011   Procedure: FLEXIBLE SIGMOIDOSCOPY;  Surgeon: Lear Ng, MD;  Location: [facility];  Service: Endoscopy;  Laterality: N/A;   HAND TENDON SURGERY Left ~ 1978 X2-1980's X3   "had 5 ORs after laceration"   HOT HEMOSTASIS  12/21/2011   Procedure: HOT HEMOSTASIS (ARGON PLASMA COAGULATION/BICAP);  Surgeon: Lear Ng, MD;  Location: [facility];  Service: Endoscopy;  Laterality: N/A;   INGUINAL HERNIA REPAIR Left 04/15/2013   Procedure: LAPAROSCOPIC REPAIR OF INCARCERATED LEFT INGUINAL HERNIA;  Surgeon: Gayland Curry, MD;  Location: [place];  Service: General;  Laterality: Left;   INGUINAL HERNIA REPAIR Bilateral 1970's - [place] Left 04/15/2013   INSERTION OF MESH Left 04/15/2013   Procedure: INSERTION OF MESH;  Surgeon: Gayland Curry, MD;  Location: [place];  Service: General;  Laterality: Left;   JOINT  REPLACEMENT     right knee   KNEE ARTHROSCOPY Right 1990's   LAPAROSCOPY N/A 04/15/2013   Procedure: LAPAROSCOPY DIAGNOSTIC;  Surgeon: Gayland Curry, MD;  Location: [place];  Service: General;  Laterality: N/A;   LUMBAR LAMINECTOMY/DECOMPRESSION MICRODISCECTOMY Right 07/22/2015   Procedure: Right Lumbar Two-Three Microdiscectomy;  Surgeon: Earnie Larsson, MD;  Location: [facility];  Service: Neurosurgery;  Laterality: Right;   LUMBAR LAMINECTOMY/DECOMPRESSION MICRODISCECTOMY Right 09/05/2016   Procedure: Microdiscectomy - right - T12-L1;  Surgeon: Earnie Larsson, MD;  Location: [place];  Service: Neurosurgery;  Laterality: Right;   NASAL SEPTUM SURGERY Bilateral 2014   "removed polyps & infection"   POLYPECTOMY  01/10/2011   Procedure: POLYPECTOMY;  Surgeon: Lear Ng, MD;  [facility]: [facility];  Service: Endoscopy;  Laterality: N/A;   POSTERIOR CERVICAL LAMINECTOMY Left 05/24/2020   Procedure: Laminectomy and Foraminotomy - left - Cervical two-Cervical three;  Surgeon: Earnie Larsson, MD;  Location: [place];  Service: Neurosurgery;  Laterality: Left;   POSTERIOR LUMBAR FUSION  11/1995   "ray cages" (04/15/2013)   SHOULDER ARTHROSCOPY Bilateral    "twice each"   TONSILLECTOMY  1960's   TOTAL KNEE ARTHROPLASTY Right 02/24/2014   Procedure: TOTAL RIGHT KNEE ARTHROPLASTY;  Surgeon: Mauri Pole, MD;  Location: [facility];  Service: Orthopedics;  Laterality: Right;   TOTAL SHOULDER ARTHROPLASTY Left 09/18/2019   Procedure: TOTAL SHOULDER ARTHROPLASTY;  Surgeon: Justice Britain, MD;  Location: [facility];  Service: Orthopedics;  Laterality: Left;  168mn   TOTAL SHOULDER ARTHROPLASTY Right 11/04/2020   Procedure: TOTAL SHOULDER ARTHROPLASTY;  Surgeon: SJustice Britain MD;  Location: [facility];  Service: Orthopedics;  Laterality: Right;  1255m   UPPER GI ENDOSCOPY     VASECTOMY  1988   WISDOM TOOTH EXTRACTION  2000's    Current Medications: Current Meds  Medication Sig   acetaminophen (TYLENOL) 500 MG  tablet Take 500 mg by mouth every 8 (eight) hours as needed for moderate pain.   allopurinol (ZYLOPRIM) 300 MG tablet TAKE 1 AND 1/2 TABLETS DAILY BY MOUTH   ALPRAZolam (XANAX) 1 MG tablet Take 1 mg by mouth daily as needed for anxiety.   amLODipine (NORVASC) 2.5 MG tablet Take 2.5 mg by mouth daily.   atenolol (TENORMIN) 100 MG tablet Take 100 mg by mouth every morning.   b complex vitamins tablet Take 1 tablet by mouth in the morning.   Biotin 5000 MCG CAPS Take 5,000 mcg by mouth in the morning.   Calcium Carbonate (CALCIUM 600 PO)  Take 600 mg by mouth in the morning.   carboxymethylcellulose (REFRESH PLUS) 0.5 % SOLN Place 1 drop into both eyes daily as needed (Dry eyes).   colchicine 0.6 MG tablet TAKE 1 TABLET (0.6 MG TOTAL) BY MOUTH 2 (TWO) TIMES DAILY AS NEEDED. (Patient taking differently: Take 0.6 mg by mouth 2 (two) times daily as needed (gout).)   cyclobenzaprine (FLEXERIL) 10 MG tablet Take 1 tablet by mouth 3 (three) times daily.   fish oil-omega-3 fatty acids 1000 MG capsule Take 1 g by mouth in the morning and at bedtime.   fluticasone (FLONASE) 50 MCG/ACT nasal spray Place 1 spray into both nostrils 2 (two) times daily. For congestion   GINSENG PO Take 400 mg by mouth in the morning.   guaiFENesin (MUCINEX) 600 MG 12 hr tablet Take 600 mg by mouth at bedtime as needed (Congestion).   hydrocortisone cream 1 % Apply 1 application topically daily as needed for itching.   HYDROmorphone (DILAUDID) 4 MG tablet Take 4 mg by mouth every 8 (eight) hours as needed.   ibuprofen (ADVIL) 200 MG tablet Take 200 mg by mouth 2 (two) times daily as needed (pain.).   magnesium gluconate (MAGONATE) 500 MG tablet Take 500 mg by mouth in the morning.   Melatonin 10 MG TABS Take 10 mg by mouth at bedtime as needed (sleep).   Methylsulfonylmethane (MSM) 1000 MG CAPS Take 1,000 mg by mouth in the morning.   Multiple Vitamins-Minerals (MULTIVITAMIN WITH MINERALS) tablet Take 1 tablet by mouth daily.    neomycin-bacitracin-polymyxin (NEOSPORIN) ointment Apply 1 application topically daily as needed for wound care.   NONFORMULARY OR COMPOUNDED ITEM Apply 1 g topically daily as needed (foot discomfort/pain.). Doxepin 5%, Amantadine 3%, Pentoxifylline 3%, Lamotrigine 2%, Sertraline 3%, Gabapentin 6% PCCA Lipoderm Base Cream   olmesartan (BENICAR) 40 MG tablet Take 40 mg by mouth in the morning.   omeprazole (PRILOSEC) 40 MG capsule Take 40 mg by mouth in the morning.   Psyllium (METAMUCIL FIBER PO) Take 1 Scoop by mouth daily in the afternoon.   RAPAFLO 8 MG CAPS capsule Take 8 mg by mouth at bedtime.   rosuvastatin (CRESTOR) 20 MG tablet Take 1 tablet (20 mg total) by mouth daily.   selenium 200 MCG TABS tablet Take 200 mcg by mouth in the morning.   sildenafil (VIAGRA) 100 MG tablet Take 100 mg by mouth daily as needed for erectile dysfunction. Per patient taking 1/2 tablet (50 mg) as needed   sodium chloride (OCEAN) 0.65 % SOLN nasal spray Place 1 spray into both nostrils as needed for congestion.   testosterone cypionate (DEPOTESTOTERONE CYPIONATE) 200 MG/ML injection Inject 100 mg into the muscle every 14 (fourteen) days. 0.5 mL   Turmeric 500 MG CAPS Take 500 mg by mouth in the morning.   venlafaxine (EFFEXOR-XR) 150 MG 24 hr capsule Take 150 mg by mouth daily with breakfast.   Vitamin D3 (VITAMIN D) 25 MCG tablet Take 1,000 Units by mouth in the morning.   vitamin E 400 UNIT capsule Take 400 Units by mouth in the morning.   [DISCONTINUED] cyclobenzaprine (FLEXERIL) 10 MG tablet Take 1 tablet (10 mg total) by mouth 3 (three) times daily as needed for muscle spasms.     Allergies:   Tetracyclines & related, Doxycycline, and Niacin and related   Social History   Socioeconomic History   Marital status: Married    Spouse name: Not on file   Number of children: Not on file  Years of education: Not on file   Highest education level: Not on file  Occupational History   Not on file   Tobacco Use   Smoking status: Former    Packs/day: 0.10    Years: 15.00    Total pack years: 1.50    Types: Cigarettes, Cigars    Quit date: 01/08/2010    Years since quitting: 11.9    Passive exposure: Never   Smokeless tobacco: Never   Tobacco comments:    04/15/2013 "smoked ~ 1 pack/month"  Vaping Use   Vaping Use: Never used  Substance and Sexual Activity   Alcohol use: Yes    Alcohol/week: 5.0 standard drinks of alcohol    Types: 1 Glasses of wine, 4 Cans of beer per week    Comment: 04/15/2013 "glass of wine w/dinner 2-3 times/month;  plus 3-4 beers/wk"   Drug use: No   Sexual activity: Yes  Other Topics Concern   Not on file  Social History Narrative   Not on file   Social Determinants of Health   Financial Resource Strain: Not on file  Food Insecurity: Not on file  Transportation Needs: Not on file  Physical Activity: Not on file  Stress: Not on file  Social Connections: Not on file     Family History: The patient's family history includes Allergies in his daughter; Alzheimer's disease in his mother; Cancer in his mother; Heart Problems in his mother; Hypertension in his father; Interstitial cystitis in his daughter.  ROS:   Please see the history of present illness.     All other systems reviewed and are negative.  EKGs/Labs/Other Studies Reviewed:    The following studies were reviewed today:   EKG: December 27, 2021: Normal sinus rhythm at 64.  Normal EKG.  Recent Labs: 05/25/2021: BUN 12; Creatinine, Ser 0.98; Hemoglobin 15.6; Platelets 238; Potassium 4.9; Sodium 136  Recent Lipid Panel No results found for: "CHOL", "TRIG", "HDL", "CHOLHDL", "VLDL", "LDLCALC", "LDLDIRECT"   Risk Assessment/Calculations:                Physical Exam:    VS:  BP 118/82   Pulse 64   Ht '5\' 8"'$  (1.727 m)   Wt 219 lb 9.6 oz (99.6 kg)   SpO2 98%   BMI 33.39 kg/m     Wt Readings from Last 3 Encounters:  12/27/21 219 lb 9.6 oz (99.6 kg)  11/29/21 221 lb  (100.2 kg)  05/31/21 224 lb (101.6 kg)     GEN:  Well nourished, well developed in no acute distress HEENT: Normal NECK: No JVD; No carotid bruits LYMPHATICS: No lymphadenopathy CARDIAC: RRR, no murmurs, rubs, gallops RESPIRATORY:  Clear to auscultation without rales, wheezing or rhonchi  ABDOMEN: Soft, non-tender, non-distended MUSCULOSKELETAL:  No edema; No deformity  SKIN: Warm and dry NEUROLOGIC:  Alert and oriented x 3 PSYCHIATRIC:  Normal affect   ASSESSMENT:    1. Angina at rest   2. Mixed hyperlipidemia   3. Medication management   4. Coronary artery calcification    PLAN:    In order of problems listed above:  Coronary artery calcifications: Richardson Landry is seen today for further evaluation of his coronary artery calcium score of 2500.  He has diffuse coronary artery calcifications predominantly in the right coronary artery and left circumflex artery. He has of occasional funny feeling in his chest but does not describe it specifically his angina.  Given the 3 vessel coronary artery calcifications I do not think that a [place]  Myoview study would be useful.  The high degree of coronary calcifications will probably preclude the use of coronary CT angiogram.  We will get a cardiac PET scan.     2.  Hyperlipidemia: His recent hyperlipidemia.  Recent lipids reveal Total cholesterol of 216 HDL is 42 Triglyceride level is 237 LDL is 132.  Was on Simvastatin but is no longer   Will starteon rosuvastatin check labs in 3 months.       Medication Adjustments/Labs and Tests Ordered: Current medicines are reviewed at length with the patient today.  Concerns regarding medicines are outlined above.  Orders Placed This Encounter  Procedures   NM PET CT CARDIAC PERFUSION MULTI W/ABSOLUTE BLOODFLOW   ALT   Lipid panel   EKG 12-Lead   Meds ordered this encounter  Medications   rosuvastatin (CRESTOR) 20 MG tablet    Sig: Take 1 tablet (20 mg total) by mouth daily.     Dispense:  90 tablet    Refill:  3    Patient Instructions  Medication Instructions:  START Rosuvastatin '20mg'$  daily *If you need a refill on your cardiac medications before your next appointment, please call your pharmacy*   Lab Work: Lipids, ALT in 3 months If you have labs (blood work) drawn today and your tests are completely normal, you will receive your results only by: [place] (if you have MyChart) OR A paper copy in the mail If you have any lab test that is abnormal or we need to change your treatment, we will call you to review the results.  Testing/Procedures: Cardiac PET scan Your physician has requested that you have cardiac CT. Cardiac computed tomography (CT) is a painless test that uses an x-ray machine to take clear, detailed pictures of your heart. For further information please visit HugeFiesta.[email or web address]. Please follow instruction sheet as given.  Follow-Up: At [facility], you and your health needs are our priority.  As part of our continuing mission to provide you with exceptional heart care, we have created designated Provider Care Teams.  These Care Teams include your primary Cardiologist (physician) and Advanced Practice Providers (APPs -  Physician Assistants and Nurse Practitioners) who all work together to provide you with the care you need, when you need it.  Your next appointment:   3 month(s)  The format for your next appointment:   In Person  Provider:   Mertie Moores, MD   Other Instructions How to Prepare for Your Cardiac PET/CT Stress Test:  1. Please do not take these medications before your test:   Medications that may interfere with the cardiac pharmacological stress agent (ex. nitrates - including erectile dysfunction medications or beta-blockers) the day of the exam. (Erectile dysfunction medication should be held for at least 72 hrs prior to test) Theophylline containing medications for 12 hours. Dipyridamole 48 hours  prior to the test. Your remaining medications may be taken with water.  2. Nothing to eat or drink, except water, 3 hours prior to arrival time.   NO caffeine/decaffeinated products, or chocolate 12 hours prior to arrival.  3. NO perfume, cologne or lotion  4. Total time is 1 to 2 hours; you may want to bring reading material for the waiting time.  5. Please report to Admitting at the [facility] Main Entrance 60 minutes early for your test.  [place], [place] [address]  Diabetic Preparation:  Hold oral medications. You may take NPH and Lantus insulin. Do not take  Humalog or Humulin R (Regular Insulin) the day of your test. Check blood sugars prior to leaving the house. If able to eat breakfast prior to 3 hour fasting, you may take all medications, including your insulin, Do not worry if you miss your breakfast dose of insulin - start at your next meal.  IF YOU THINK YOU MAY BE PREGNANT, OR ARE NURSING PLEASE INFORM THE TECHNOLOGIST.  In preparation for your appointment, medication and supplies will be purchased.  Appointment availability is limited, so if you need to cancel or reschedule, please call the Radiology Department at [phone]  24 hours in advance to avoid a cancellation fee of $100.00  What to Expect After you Arrive:  Once you arrive and check in for your appointment, you will be taken to a preparation room within the Radiology Department.  A technologist or Nurse will obtain your medical history, verify that you are correctly prepped for the exam, and explain the procedure.  Afterwards,  an IV will be started in your arm and electrodes will be placed on your skin for EKG monitoring during the stress portion of the exam. Then you will be escorted to the PET/CT scanner.  There, staff will get you positioned on the scanner and obtain a blood pressure and EKG.  During the exam, you will continue to be connected to the EKG and blood pressure  machines.  A small, safe amount of a radioactive tracer will be injected in your IV to obtain a series of pictures of your heart along with an injection of a stress agent.    After your Exam:  It is recommended that you eat a meal and drink a caffeinated beverage to counter act any effects of the stress agent.  Drink plenty of fluids for the remainder of the day and urinate frequently for the first couple of hours after the exam.  Your doctor will inform you of your test results within 7-10 business days.  For questions about your test or how to prepare for your test, please call: Marchia Bond, Cardiac Imaging Nurse Navigator  Gordy Clement, Cardiac Imaging Nurse Navigator Office: [phone]   Important Information About Sugar         Signed, Mertie Moores, MD  12/27/2021 5:59 PM    [place]

## 2021-12-27 NOTE — Patient Instructions (Signed)
Medication Instructions:  START Rosuvastatin '20mg'$  daily *If you need a refill on your cardiac medications before your next appointment, please call your pharmacy*   Lab Work: Lipids, ALT in 3 months If you have labs (blood work) drawn today and your tests are completely normal, you will receive your results only by: [place] (if you have MyChart) OR A paper copy in the mail If you have any lab test that is abnormal or we need to change your treatment, we will call you to review the results.  Testing/Procedures: Cardiac PET scan Your physician has requested that you have cardiac CT. Cardiac computed tomography (CT) is a painless test that uses an x-ray machine to take clear, detailed pictures of your heart. For further information please visit HugeFiesta.[email or web address]. Please follow instruction sheet as given.  Follow-Up: At [facility], you and your health needs are our priority.  As part of our continuing mission to provide you with exceptional heart care, we have created designated Provider Care Teams.  These Care Teams include your primary Cardiologist (physician) and Advanced Practice Providers (APPs -  Physician Assistants and Nurse Practitioners) who all work together to provide you with the care you need, when you need it.  Your next appointment:   3 month(s)  The format for your next appointment:   In Person  Provider:   Mertie Moores, MD   Other Instructions How to Prepare for Your Cardiac PET/CT Stress Test:  1. Please do not take these medications before your test:   Medications that may interfere with the cardiac pharmacological stress agent (ex. nitrates - including erectile dysfunction medications or beta-blockers) the day of the exam. (Erectile dysfunction medication should be held for at least 72 hrs prior to test) Theophylline containing medications for 12 hours. Dipyridamole 48 hours prior to the test. Your remaining medications may be taken with  water.  2. Nothing to eat or drink, except water, 3 hours prior to arrival time.   NO caffeine/decaffeinated products, or chocolate 12 hours prior to arrival.  3. NO perfume, cologne or lotion  4. Total time is 1 to 2 hours; you may want to bring reading material for the waiting time.  5. Please report to Admitting at the [facility] Main Entrance 60 minutes early for your test.  [place], [place] [address]  Diabetic Preparation:  Hold oral medications. You may take NPH and Lantus insulin. Do not take Humalog or Humulin R (Regular Insulin) the day of your test. Check blood sugars prior to leaving the house. If able to eat breakfast prior to 3 hour fasting, you may take all medications, including your insulin, Do not worry if you miss your breakfast dose of insulin - start at your next meal.  IF YOU THINK YOU MAY BE PREGNANT, OR ARE NURSING PLEASE INFORM THE TECHNOLOGIST.  In preparation for your appointment, medication and supplies will be purchased.  Appointment availability is limited, so if you need to cancel or reschedule, please call the Radiology Department at [phone]  24 hours in advance to avoid a cancellation fee of $100.00  What to Expect After you Arrive:  Once you arrive and check in for your appointment, you will be taken to a preparation room within the Radiology Department.  A technologist or Nurse will obtain your medical history, verify that you are correctly prepped for the exam, and explain the procedure.  Afterwards,  an IV will be started in your arm and electrodes will  be placed on your skin for EKG monitoring during the stress portion of the exam. Then you will be escorted to the PET/CT scanner.  There, staff will get you positioned on the scanner and obtain a blood pressure and EKG.  During the exam, you will continue to be connected to the EKG and blood pressure machines.  A small, safe amount of a radioactive tracer will be injected  in your IV to obtain a series of pictures of your heart along with an injection of a stress agent.    After your Exam:  It is recommended that you eat a meal and drink a caffeinated beverage to counter act any effects of the stress agent.  Drink plenty of fluids for the remainder of the day and urinate frequently for the first couple of hours after the exam.  Your doctor will inform you of your test results within 7-10 business days.  For questions about your test or how to prepare for your test, please call: Marchia Bond, Cardiac Imaging Nurse Navigator  Gordy Clement, Cardiac Imaging Nurse Navigator Office: [phone]   Important Information About Sugar

## 2022-01-02 DIAGNOSIS — G629 Polyneuropathy, unspecified: Secondary | ICD-10-CM | POA: Diagnosis not present

## 2022-01-02 DIAGNOSIS — L609 Nail disorder, unspecified: Secondary | ICD-10-CM | POA: Diagnosis not present

## 2022-01-02 DIAGNOSIS — I739 Peripheral vascular disease, unspecified: Secondary | ICD-10-CM | POA: Diagnosis not present

## 2022-01-04 ENCOUNTER — Encounter: Payer: Self-pay | Admitting: Cardiovascular Disease

## 2022-01-16 ENCOUNTER — Encounter: Payer: Self-pay | Admitting: Cardiovascular Disease

## 2022-01-16 DIAGNOSIS — L601 Onycholysis: Secondary | ICD-10-CM | POA: Diagnosis not present

## 2022-01-20 ENCOUNTER — Encounter: Payer: Self-pay | Admitting: Cardiovascular Disease

## 2022-01-20 ENCOUNTER — Telehealth (HOSPITAL_COMMUNITY): Payer: Self-pay | Admitting: Emergency Medicine

## 2022-01-20 NOTE — Telephone Encounter (Signed)
Reaching out to patient to offer assistance regarding upcoming cardiac imaging study; pt verbalizes understanding of appt date/time, parking situation and where to check in, pre-test NPO status and medications ordered, and verified current allergies; name and call back number provided for further questions should they arise Marchia Bond RN Navigator Cardiac Imaging [facility] [phone] office [phone] cell  Arrival 930 Denies iv issues Holding atenolol, viagra Denies iv issues No caffeine No food x 3 hr prior

## 2022-01-24 ENCOUNTER — Ambulatory Visit (HOSPITAL_COMMUNITY)
Admission: RE | Admit: 2022-01-24 | Discharge: 2022-01-24 | Disposition: A | Discharge status: Home / Self Care | Payer: Medicare Other | Source: Ambulatory Visit | Attending: Cardiovascular Disease | Admitting: Cardiovascular Disease

## 2022-01-24 DIAGNOSIS — Z79899 Other long term (current) drug therapy: Secondary | ICD-10-CM | POA: Diagnosis not present

## 2022-01-24 DIAGNOSIS — I2510 Atherosclerotic heart disease of native coronary artery without angina pectoris: Secondary | ICD-10-CM

## 2022-01-24 DIAGNOSIS — E782 Mixed hyperlipidemia: Secondary | ICD-10-CM | POA: Diagnosis not present

## 2022-01-24 DIAGNOSIS — I2584 Coronary atherosclerosis due to calcified coronary lesion: Secondary | ICD-10-CM

## 2022-01-24 DIAGNOSIS — I2089 Other forms of angina pectoris: Secondary | ICD-10-CM

## 2022-01-24 MED ORDER — REGADENOSON 0.4 MG/5ML IV SOLN
INTRAVENOUS | Status: AC
  Administered 2022-01-24: 0.4 mg via INTRAVENOUS
  Filled 2022-01-24: qty 5

## 2022-01-24 MED ORDER — RUBIDIUM RB82 GENERATOR (RUBYFILL)
25.0000 | PACK | Freq: Once | INTRAVENOUS | Status: AC
  Administered 2022-01-24: 23.9 via INTRAVENOUS

## 2022-01-24 MED ORDER — REGADENOSON 0.4 MG/5ML IV SOLN: 0.4000 mg | Freq: Once | INTRAVENOUS | Status: AC

## 2022-01-24 MED ORDER — RUBIDIUM RB82 GENERATOR (RUBYFILL)
25.0000 | PACK | Freq: Once | INTRAVENOUS | Status: AC
  Administered 2022-01-24: 23.8 via INTRAVENOUS

## 2022-01-25 ENCOUNTER — Other Ambulatory Visit: Payer: Medicare Other

## 2022-01-25 LAB — NM PET CT CARDIAC PERFUSION MULTI W/ABSOLUTE BLOODFLOW
MBFR: 2.18
Nuc Rest EF: 52 %
Nuc Stress EF: 62 %
Rest MBF: 0.6 ml/g/min
Rest Nuclear Isotope Dose: 23.9 mCi
ST Depression (mm): 0 mm
Stress MBF: 1.31 ml/g/min
Stress Nuclear Isotope Dose: 23.8 mCi
TID: 1.25

## 2022-01-27 ENCOUNTER — Encounter: Payer: Self-pay | Admitting: Cardiovascular Disease

## 2022-01-27 DIAGNOSIS — Z01810 Encounter for preprocedural cardiovascular examination: Secondary | ICD-10-CM

## 2022-01-27 MED ORDER — NITROGLYCERIN 0.4 MG SL SUBL: SUBLINGUAL_TABLET | SUBLINGUAL | 6 refills | Status: DC

## 2022-01-27 MED ORDER — ASPIRIN 81 MG PO TBEC: 81.0000 mg | DELAYED_RELEASE_TABLET | Freq: Every day | ORAL | 3 refills | Status: DC

## 2022-01-27 NOTE — Telephone Encounter (Signed)
Called and spoke with patient and wife via speakerphone. They wish to proceed with cath plan and want to get it done prior to wife's surgery on 12/13. Pt is scheduled to see Richardson Dopp, PA on 01/31/22 for updated H & P, EKG, and labs. Cath scheduled for 02/03/22 w/[place]. Pt instructions reviewed on the phone and sent via [place].

## 2022-01-30 NOTE — Progress Notes (Addendum)
Cardiology Office Note    Date:  01/31/2022   ID:  Victor [name], DOB 08-Jan-1956, MRN [id number]  PCP:  Antony Contras, MD  Cardiologist:  None  Electrophysiologist:  None   Chief Complaint: follow up for PET results and to schedule cardiac cath  History of Present Illness:   Victor [name] is a 66 y.o. male with history of HTN, hyperlipidemia, and calcification of the aorta, and angina at rest.  He had a coronary calcium score of 2535. This was 75 percentile for age-,race-, and sex-matched controls. He had complaints of "funny feeling" in his chest but not described as angina in nature. Decision was made for a NM PET CT cardiac perfusion which showed findings consistent with ischemia.   He presents today to schedule his cardiac cath. Since his last visit and PET scan, he has not had any further sensations in his chest. He denies CP or other "chest sensations", palpitations, SOB, orthopnea, pedal edema, presyncope or syncope. He does endorse significant back pain, neck pain, and right shoulder pain. He takes dilaudid 4 mg as needed every 8 hours. His BP is elevated today 165/96. He checks it frequently throughout the day and says it varies greatly, sometimes is 110's/60's - 160's/90's. He is concerned that he will not be able to lie still for the cath due to his chronic pain.   Labwork independently reviewed: 11/30/21 NA 139, K 6.0, Calcium 10.6, AST 40 11/10/21 LDL 132    Cardiology Studies:   Studies reviewed are outlined and summarized above. Reports included below if pertinent.   01/24/22 PET CT Cardiac Perfusion - Findings are consistent with ischemia. The study is intermediate risk. LV perfusion is abnormal. There is evidence of ischemia. Defect 1: There is a small defect with mild reduction in uptake present in the mid to basal inferior location(s) that is reversible. There is normal wall motion in the defect area. Consistent with ischemia. The defect is consistent  with abnormal perfusion in the RCA territory. Rest left ventricular function is normal. Rest EF: 52 %. Stress left ventricular function is normal. Stress EF: 62 %. End diastolic cavity size is normal. End systolic cavity size is normal. Myocardial blood flow was computed to be 0.56m/g/min at rest and 1.351mg/min at stress. Global myocardial blood flow reserve was 2.18 and was normal. Coronary calcium was present on the attenuation correction CT images. Severe coronary calcifications were present. Coronary calcifications were present in the left anterior descending artery, left circumflex artery and right coronary artery distribution(s). Severe multivessel coronary calcifications and presence of TID (ratio 1.25) raises concern for multivessel obstructive CAD. However, there is only a small area of ischemia on perfusion images (basal to mid inferior wall), flow reserve is normal globally and in each coronary territory, and there is appropriate increase in LVEF with stress, which argues against multivessel disease. Overall, would classify as intermediate risk.  11/28/21 CT Cardiac Scoring -  Coronary calcium score of 2535. This was 9853ercentile for age-,race-, and sex-matched controls. Mildly dilated ascending aorta - 43 mm.       Past Medical History:  Diagnosis Date   Allergic rhinitis    Anxiety    Arteriosclerosis of thoracic aorta (HC[facility]gum   Arthritis    "all over" (04/15/2013)   Back pain, chronic    BMI 32.0-32.9,adult    Cervicalgia    Chronic neck pain    COVID-19    DDD (degenerative disc disease), cervical    DDD (degenerative  disc disease), lumbar    Diverticulosis    Elevated hemoglobin (HCC)    Fibromyalgia    GERD (gastroesophageal reflux disease)    Gout    Headache    High cholesterol    History of bronchitis    Hyperkalemia    Hypertension    Insomnia    Kidney disease with benign hypertension    Leg cramps    Right greater than left   Low back pain    Low  testosterone    Melanoma (HCC)    Nose   MRSA infection    Neuropathy of both feet    Peripheral vascular disease (HCC)    Personal history of colonic polyps    Polycythemia    Pre-diabetes    Radiculopathy of lumbar region    Raynaud phenomenon    Recurrent sinus infections    Right sided sciatica    Sleep apnea    "don't use CPAP" (04/15/2013)   Tobacco dependence     Past Surgical History:  Procedure Laterality Date   ANTERIOR CERVICAL DECOMP/DISCECTOMY FUSION  2000's X3   "had 3 surgeries on my front neck" (04/15/2013)   ANTERIOR LAT LUMBAR FUSION Left 08/06/2017   Procedure: LEFT LUMBAR TWO-THREE ANTERIOR LATERAL LUMBAR FUSION WITH PERCUTANEOUS SCREWS;  Surgeon: Earnie Larsson, MD;  Location: [place];  Service: Neurosurgery;  Laterality: Left;   APPENDECTOMY  ~ 2010   BACK SURGERY     CARPAL TUNNEL RELEASE Left 1990's   CATARACT EXTRACTION, BILATERAL     COLONOSCOPY     ear drum surgery  1974   EYE SURGERY Right    FLEXIBLE SIGMOIDOSCOPY  12/21/2011   Procedure: FLEXIBLE SIGMOIDOSCOPY;  Surgeon: Lear Ng, MD;  Location: [facility];  Service: Endoscopy;  Laterality: N/A;   HAND TENDON SURGERY Left ~ 1978 X2-1980's X3   "had 5 ORs after laceration"   HOT HEMOSTASIS  12/21/2011   Procedure: HOT HEMOSTASIS (ARGON PLASMA COAGULATION/BICAP);  Surgeon: Lear Ng, MD;  Location: [facility];  Service: Endoscopy;  Laterality: N/A;   INGUINAL HERNIA REPAIR Left 04/15/2013   Procedure: LAPAROSCOPIC REPAIR OF INCARCERATED LEFT INGUINAL HERNIA;  Surgeon: Gayland Curry, MD;  Location: [place];  Service: General;  Laterality: Left;   INGUINAL HERNIA REPAIR Bilateral 1970's - [place] Left 04/15/2013   INSERTION OF MESH Left 04/15/2013   Procedure: INSERTION OF MESH;  Surgeon: Gayland Curry, MD;  Location: [place];  Service: General;  Laterality: Left;   JOINT REPLACEMENT     right knee   KNEE ARTHROSCOPY Right 1990's   LAPAROSCOPY N/A 04/15/2013    Procedure: LAPAROSCOPY DIAGNOSTIC;  Surgeon: Gayland Curry, MD;  Location: [place];  Service: General;  Laterality: N/A;   LUMBAR LAMINECTOMY/DECOMPRESSION MICRODISCECTOMY Right 07/22/2015   Procedure: Right Lumbar Two-Three Microdiscectomy;  Surgeon: Earnie Larsson, MD;  Location: [facility];  Service: Neurosurgery;  Laterality: Right;   LUMBAR LAMINECTOMY/DECOMPRESSION MICRODISCECTOMY Right 09/05/2016   Procedure: Microdiscectomy - right - T12-L1;  Surgeon: Earnie Larsson, MD;  Location: [place];  Service: Neurosurgery;  Laterality: Right;   NASAL SEPTUM SURGERY Bilateral 2014   "removed polyps & infection"   POLYPECTOMY  01/10/2011   Procedure: POLYPECTOMY;  Surgeon: Lear Ng, MD;  Location: [facility];  Service: Endoscopy;  Laterality: N/A;   POSTERIOR CERVICAL LAMINECTOMY Left 05/24/2020   Procedure: Laminectomy and Foraminotomy - left - Cervical two-Cervical three;  Surgeon: Earnie Larsson, MD;  Location: [place];  Service: Neurosurgery;  Laterality: Left;   POSTERIOR LUMBAR FUSION  11/1995   "ray cages" (04/15/2013)   SHOULDER ARTHROSCOPY Bilateral    "twice each"   TONSILLECTOMY  1960's   TOTAL KNEE ARTHROPLASTY Right 02/24/2014   Procedure: TOTAL RIGHT KNEE ARTHROPLASTY;  Surgeon: Mauri Pole, MD;  Location: [facility];  Service: Orthopedics;  Laterality: Right;   TOTAL SHOULDER ARTHROPLASTY Left 09/18/2019   Procedure: TOTAL SHOULDER ARTHROPLASTY;  Surgeon: Justice Britain, MD;  Location: [facility];  Service: Orthopedics;  Laterality: Left;  121mn   TOTAL SHOULDER ARTHROPLASTY Right 11/04/2020   Procedure: TOTAL SHOULDER ARTHROPLASTY;  Surgeon: SJustice Britain MD;  Location: [facility];  Service: Orthopedics;  Laterality: Right;  1242m   UPPER GI ENDOSCOPY     VASECTOMY  1988   WISDOM TOOTH EXTRACTION  2000's    Current Medications: Current Meds  Medication Sig   acetaminophen (TYLENOL) 500 MG tablet Take 500 mg by mouth every 8 (eight) hours as needed for moderate pain.   allopurinol  (ZYLOPRIM) 300 MG tablet TAKE 1 AND 1/2 TABLETS DAILY BY MOUTH   ALPRAZolam (XANAX) 1 MG tablet Take 1 mg by mouth daily as needed for anxiety.   amLODipine (NORVASC) 2.5 MG tablet Take 2.5 mg by mouth daily.   aspirin EC 81 MG tablet Take 1 tablet (81 mg total) by mouth daily. Swallow whole.   atenolol (TENORMIN) 100 MG tablet Take 100 mg by mouth every morning.   b complex vitamins tablet Take 1 tablet by mouth in the morning.   Biotin 5000 MCG CAPS Take 5,000 mcg by mouth in the morning.   Calcium Carbonate (CALCIUM 600 PO) Take 600 mg by mouth in the morning.   carboxymethylcellulose (REFRESH PLUS) 0.5 % SOLN Place 1 drop into both eyes daily as needed (Dry eyes).   colchicine 0.6 MG tablet TAKE 1 TABLET (0.6 MG TOTAL) BY MOUTH 2 (TWO) TIMES DAILY AS NEEDED.   cyclobenzaprine (FLEXERIL) 10 MG tablet Take 1 tablet by mouth 3 (three) times daily.   fish oil-omega-3 fatty acids 1000 MG capsule Take 1 g by mouth in the morning and at bedtime.   fluticasone (FLONASE) 50 MCG/ACT nasal spray Place 1 spray into both nostrils 2 (two) times daily. For congestion   GINSENG PO Take 400 mg by mouth in the morning.   guaiFENesin (MUCINEX) 600 MG 12 hr tablet Take 600 mg by mouth at bedtime as needed (Congestion).   hydrocortisone cream 1 % Apply 1 application topically daily as needed for itching.   HYDROmorphone (DILAUDID) 4 MG tablet Take 4 mg by mouth every 8 (eight) hours as needed.   ibuprofen (ADVIL) 200 MG tablet Take 200 mg by mouth 2 (two) times daily as needed (pain.).   magnesium gluconate (MAGONATE) 500 MG tablet Take 500 mg by mouth in the morning.   Melatonin 10 MG TABS Take 10 mg by mouth at bedtime as needed (sleep).   Methylsulfonylmethane (MSM) 1000 MG CAPS Take 1,000 mg by mouth in the morning.   Multiple Vitamins-Minerals (MULTIVITAMIN WITH MINERALS) tablet Take 1 tablet by mouth daily.   neomycin-bacitracin-polymyxin (NEOSPORIN) ointment Apply 1 application topically daily as needed  for wound care.   nitroGLYCERIN (NITROSTAT) 0.4 MG SL tablet Dissolve 1 tablet under the tongue every 5 minutes as needed for chest pain. Max of 3 doses, then 911.   NONFORMULARY OR COMPOUNDED ITEM Apply 1 g topically daily as needed (foot discomfort/pain.). Doxepin 5%, Amantadine 3%, Pentoxifylline 3%, Lamotrigine 2%, Sertraline  3%, Gabapentin 6% PCCA Lipoderm Base Cream   olmesartan (BENICAR) 40 MG tablet Take 40 mg by mouth in the morning.   omeprazole (PRILOSEC) 40 MG capsule Take 40 mg by mouth in the morning.   Psyllium (METAMUCIL FIBER PO) Take 1 Scoop by mouth daily in the afternoon.   RAPAFLO 8 MG CAPS capsule Take 8 mg by mouth at bedtime.   rosuvastatin (CRESTOR) 20 MG tablet Take 1 tablet (20 mg total) by mouth daily.   selenium 200 MCG TABS tablet Take 200 mcg by mouth in the morning.   sildenafil (VIAGRA) 100 MG tablet Take 100 mg by mouth daily as needed for erectile dysfunction. Per patient taking 1/2 tablet (50 mg) as needed   sodium chloride (OCEAN) 0.65 % SOLN nasal spray Place 1 spray into both nostrils as needed for congestion.   testosterone cypionate (DEPOTESTOTERONE CYPIONATE) 200 MG/ML injection Inject 100 mg into the muscle every 14 (fourteen) days. 0.5 mL   Turmeric 500 MG CAPS Take 500 mg by mouth in the morning.   venlafaxine (EFFEXOR-XR) 150 MG 24 hr capsule Take 150 mg by mouth daily with breakfast.   Vitamin D3 (VITAMIN D) 25 MCG tablet Take 1,000 Units by mouth in the morning.   vitamin E 400 UNIT capsule Take 400 Units by mouth in the morning.      Allergies:   Flexeril [cyclobenzaprine], Tetracyclines & related, Doxycycline, and Niacin and related   Social History   Socioeconomic History   Marital status: Married    Spouse name: Not on file   Number of children: Not on file   Years of education: Not on file   Highest education level: Not on file  Occupational History   Not on file  Tobacco Use   Smoking status: Former    Packs/day: 0.10    Years:  15.00    Total pack years: 1.50    Types: Cigarettes, Cigars    Quit date: 01/08/2010    Years since quitting: 12.0    Passive exposure: Never   Smokeless tobacco: Never   Tobacco comments:    04/15/2013 "smoked ~ 1 pack/month"  Vaping Use   Vaping Use: Never used  Substance and Sexual Activity   Alcohol use: Yes    Alcohol/week: 5.0 standard drinks of alcohol    Types: 1 Glasses of wine, 4 Cans of beer per week    Comment: 04/15/2013 "glass of wine w/dinner 2-3 times/month;  plus 3-4 beers/wk"   Drug use: No   Sexual activity: Yes  Other Topics Concern   Not on file  Social History Narrative   Not on file   Social Determinants of Health   Financial Resource Strain: Not on file  Food Insecurity: Not on file  Transportation Needs: Not on file  Physical Activity: Not on file  Stress: Not on file  Social Connections: Not on file     Family History:  The patient's family history includes Allergies in his daughter; Alzheimer's disease in his mother; Cancer in his mother; Heart Problems in his mother; Hypertension in his father; Interstitial cystitis in his daughter.  ROS:   Review of Systems  Constitutional: Negative.   HENT: Negative.    Eyes: Negative.  Negative for blurred vision.  Respiratory:  Negative for shortness of breath.   Cardiovascular:  Negative for chest pain, palpitations, orthopnea and leg swelling.  Gastrointestinal: Negative.   Genitourinary: Negative.   Musculoskeletal:  Positive for back pain, joint pain (right shoulder), myalgias and  neck pain.  Skin: Negative.   Neurological:  Negative for dizziness and headaches.  Endo/Heme/Allergies:  Bruises/bleeds easily.  Psychiatric/Behavioral: Negative.        EKG(s)/Additional Labs   EKG:  EKG is ordered today, personally reviewed, demonstrating SR with 1st degree AV block   Recent Labs: 05/25/2021: BUN 12; Creatinine, Ser 0.98; Hemoglobin 15.6; Platelets 238; Potassium 4.9; Sodium 136  Recent Lipid  Panel No results found for: "CHOL", "TRIG", "HDL", "CHOLHDL", "VLDL", "LDLCALC", "LDLDIRECT"  PHYSICAL EXAM:    VS:  BP (!) 150/88   Pulse 62   Ht '5\' 8"'$  (1.727 m)   Wt 224 lb (101.6 kg)   SpO2 93%   BMI 34.06 kg/m   BMI: Body mass index is 34.06 kg/m.  GEN: Well nourished, well developed male in no acute distress HEENT: normocephalic, atraumatic Neck: no JVD, carotid bruits, or masses Cardiac: RRR; no murmurs, rubs, or gallops, no edema  Respiratory:  clear to auscultation bilaterally, normal work of breathing GI: soft, nontender, nondistended, + BS MS: no deformity or atrophy Skin: warm and dry, no rash Neuro:  Alert and Oriented x 3, Strength and sensation are intact, follows commands Psych: euthymic mood, full affect  Wt Readings from Last 3 Encounters:  01/31/22 224 lb (101.6 kg)  12/27/21 219 lb 9.6 oz (99.6 kg)  11/29/21 221 lb (100.2 kg)     ASSESSMENT & PLAN:   CAD with angina - Patient has not had any further "chest sensations" since his PET scan. He is able to work out at the gym (mostly upper body weights d/t knee pain) without any notable SOB or "chest sensations". PET scan Findings are consistent with ischemia, intermediate risk, small area of ischemia on perfusion images (basal to mid inferior wall), flow reserve is normal globally and in each coronary territory, and there is appropriate increase in LVEF with stress, which argues against multivessel disease. Concerns from patient and his wife about his inability to lay flat and still due to his chronic pain, discussed with Dr. Acie Fredrickson recommended in proceeding with cardiac catheterization, if he is unable to lay still the procedure may have to be aborted. Catheterization arranged for 02/03/22. Continue with atenolol 100 mg every morning, amlodipine 2.5 mg daily, benicar 40 mg daily, crestor 20 mg daily, ASA 81 mg daily.  Hypertension - BP today 165/96, recheck 150/88. States that it fluctuates at home, can range  110's/60's - 160's/90's. He has been in considerable pain for some time and that this correlates with his BP.  On atenolol 100 mg every morning, amlodipine 2.5 mg daily, benicar 40 mg daily.  Consider titrating amlodipine up at next visit if BP remains elevated.  Hyperlipidemia - LDL 132, continue crestor 20 mg daily. Was recently started on 12/27/21.   Shared Decision Making/Informed Consent The risks [stroke (1 in 1000), death (1 in 1000), kidney failure [usually temporary] (1 in 500), bleeding (1 in 200), allergic reaction [possibly serious] (1 in 200)], benefits (diagnostic support and management of coronary artery disease) and alternatives of a cardiac catheterization were discussed in detail with Victor [name] and he is willing to proceed.     Disposition: F/u with APP in 7-10 days after cardiac catheterization   Medication Adjustments/Labs and Tests Ordered: Current medicines are reviewed at length with the patient today.  Concerns regarding medicines are outlined above. Medication changes, Labs and Tests ordered today are summarized above and listed in the Patient Instructions accessible in Encounters.   Signed, Trudi Ida, NP  01/31/2022 3:52 PM    [facility] Phone: [phone]; Fax: [phone]

## 2022-01-31 ENCOUNTER — Encounter: Payer: Self-pay | Admitting: *Deleted

## 2022-01-31 ENCOUNTER — Ambulatory Visit: Payer: Medicare Other

## 2022-01-31 ENCOUNTER — Ambulatory Visit: Payer: Medicare Other | Attending: Physician Assistant | Admitting: Cardiology

## 2022-01-31 ENCOUNTER — Encounter: Payer: Self-pay | Admitting: Physician Assistant

## 2022-01-31 VITALS — BP 150/88 | HR 62 | Ht 68.0 in | Wt 224.0 lb

## 2022-01-31 DIAGNOSIS — Z01810 Encounter for preprocedural cardiovascular examination: Secondary | ICD-10-CM | POA: Diagnosis not present

## 2022-01-31 DIAGNOSIS — I2089 Other forms of angina pectoris: Secondary | ICD-10-CM | POA: Diagnosis not present

## 2022-01-31 DIAGNOSIS — I25119 Atherosclerotic heart disease of native coronary artery with unspecified angina pectoris: Secondary | ICD-10-CM | POA: Diagnosis not present

## 2022-01-31 DIAGNOSIS — I10 Essential (primary) hypertension: Secondary | ICD-10-CM | POA: Diagnosis not present

## 2022-01-31 DIAGNOSIS — E782 Mixed hyperlipidemia: Secondary | ICD-10-CM | POA: Diagnosis not present

## 2022-01-31 NOTE — Patient Instructions (Signed)
Medication Instructions:  NO CHANGES *If you need a refill on your cardiac medications before your next appointment, please call your pharmacy*   Lab Work: TODAY BMET CBC  If you have labs (blood work) drawn today and your tests are completely normal, you will receive your results only by: [place] (if you have MyChart) OR A paper copy in the mail If you have any lab test that is abnormal or we need to change your treatment, we will call you to review the results.   Testing/Procedure Your physician has requested that you have a cardiac catheterization. Cardiac catheterization is used to diagnose and/or treat various heart conditions. Doctors may recommend this procedure for a number of different reasons. The most common reason is to evaluate chest pain. Chest pain can be a symptom of coronary artery disease (CAD), and cardiac catheterization can show whether plaque is narrowing or blocking your heart's arteries. This procedure is also used to evaluate the valves, as well as measure the blood flow and oxygen levels in different parts of your heart. For further information please visit [email or web address]. Please follow instruction sheet, as given.    Follow-Up: At [facility], you and your health needs are our priority.  As part of our continuing mission to provide you with exceptional heart care, we have created designated Provider Care Teams.  These Care Teams include your primary Cardiologist (physician) and Advanced Practice Providers (APPs -  Physician Assistants and Nurse Practitioners) who all work together to provide you with the care you need, when you need it.  We recommend signing up for the patient portal called "MyChart".  Sign up information is provided on this After Visit Summary.  MyChart is used to connect with patients for Virtual Visits (Telemedicine).  Patients are able to view lab/test results, encounter notes, upcoming appointments, etc.  Non-urgent messages  can be sent to your provider as well.   To learn more about what you can do with MyChart, go to [email or web address].    Your next appointment:   1 week(s) TO 10 DAYS AFTER CATH  CATH IS 02/03/22  The format for your next appointment:   In Person  Provider:  APP     Other Instructions NONE   Important Information About Sugar

## 2022-02-01 ENCOUNTER — Telehealth: Payer: Self-pay | Admitting: Cardiovascular Disease

## 2022-02-01 ENCOUNTER — Other Ambulatory Visit (INDEPENDENT_AMBULATORY_CARE_PROVIDER_SITE_OTHER): Payer: Medicare Other | Admitting: Cardiology

## 2022-02-01 ENCOUNTER — Other Ambulatory Visit: Payer: Medicare Other

## 2022-02-01 DIAGNOSIS — E875 Hyperkalemia: Secondary | ICD-10-CM | POA: Diagnosis not present

## 2022-02-01 LAB — BASIC METABOLIC PANEL
BUN/Creatinine Ratio: 18 (ref 10–24)
BUN: 21 mg/dL (ref 8–27)
CO2: 23 mmol/L (ref 20–29)
Calcium: 10.4 mg/dL — ABNORMAL HIGH (ref 8.6–10.2)
Chloride: 97 mmol/L (ref 96–106)
Creatinine, Ser: 1.19 mg/dL (ref 0.76–1.27)
Glucose: 97 mg/dL (ref 70–99)
Potassium: 6.2 mmol/L (ref 3.5–5.2)
Sodium: 136 mmol/L (ref 134–144)
eGFR: 67 mL/min/{1.73_m2} (ref >59)

## 2022-02-01 LAB — CBC
Hematocrit: 51.7 % — ABNORMAL HIGH (ref 37.5–51.0)
Hemoglobin: 17.4 g/dL (ref 13.0–17.7)
MCH: 31.8 pg (ref 26.6–33.0)
MCHC: 33.7 g/dL (ref 31.5–35.7)
MCV: 94 fL (ref 79–97)
Platelets: 225 10*3/uL (ref 150–450)
RBC: 5.48 x10E6/uL (ref 4.14–5.80)
RDW: 13.9 % (ref 11.6–15.4)
WBC: 9.2 10*3/uL (ref 3.4–10.8)

## 2022-02-01 NOTE — Progress Notes (Signed)
    I attempted to call the patient this morning but there was no answer. I left a voicemail advising of an abnormal lab result that requires repeat labs first thing this morning. Will call the patient again later. I have ordered a STAT BMP and notified the [address] office schedulers to expect the patient today.

## 2022-02-01 NOTE — Telephone Encounter (Signed)
Spoke with pt's wife, DPR and advised of plan as below if pt is unable to come for repeat BMET tomorrow.  Pt's wife states pt plans to come tomorrow 12/07 for repeat lab as of right now and thanked RN for the call.

## 2022-02-01 NOTE — Addendum Note (Signed)
Addended by: Thora Lance on: 02/01/2022 12:46 PM   Modules accepted: Orders

## 2022-02-01 NOTE — H&P (View-Only) (Signed)
    I attempted to call the patient this morning but there was no answer. I left a voicemail advising of an abnormal lab result that requires repeat labs first thing this morning. Will call the patient again later. I have ordered a STAT BMP and notified the [address] office schedulers to expect the patient today.

## 2022-02-01 NOTE — Telephone Encounter (Signed)
Dr Angelena Form made aware of pt's current K+ and plans for repeat stat BMET based on pt follow up.

## 2022-02-01 NOTE — Telephone Encounter (Signed)
Appointment scheduled with lab for today 02/01/2022 as requested by PA-C.

## 2022-02-01 NOTE — Telephone Encounter (Signed)
Spoke with pt's wife,DPR and advised appointment scheduled today for repeat BMET due to elevated K+ at the request of Lily Kocher, PA-C.  Pt's wife states she is unable to bring pt today and may not bring tomorrow for lab either.  Advised importance of repeating lab to confirm K+.  Pt's wife states heart cath may need to be rescheduled if BMET has to be completed prior to the cath.  Appointment scheduled for repeat lab tomorrow 02/02/22 and will need to be here by 430pm.  Pt's wife requests she be contacted by Judson Roch to discuss moving the cath.

## 2022-02-01 NOTE — Addendum Note (Signed)
Addended by: Briant Cedar on: 02/01/2022 08:21 AM   Modules accepted: Orders

## 2022-02-01 NOTE — Telephone Encounter (Signed)
Lily Kocher, PA-C  [facility] [address] Scheduling This patient had a pre-cath outpatient BMP with K of 6.2. I've ordered a stat repeat. There aren't any lab appointments available for today but can we please accommodate him when he comes in?  Thanks!  Lily Kocher PA-C   Called patient regarding this staff message. I spoke to the patient's wife and she states that she did see the results in MyChart and she says that this is a pattern with his Potassium being high. She said that his potassium is normal until it sits then it will hemolyze and become high after sitting and waiting to be tested. She wants to know if she schedules an appt for him to come in for his STAT lab work that the blood work will be test immediately and not be sent off. Please advise.

## 2022-02-01 NOTE — Telephone Encounter (Addendum)
Spoke with Standley Brooking who has contacted Edmonia Lynch heart cath navigator who states she will order stat BMET to be completed on arrival Friday morning prior heart cath.

## 2022-02-02 ENCOUNTER — Ambulatory Visit: Payer: Medicare Other | Attending: Cardiovascular Disease

## 2022-02-02 ENCOUNTER — Encounter: Payer: Self-pay | Admitting: Cardiovascular Disease

## 2022-02-02 ENCOUNTER — Telehealth: Payer: Self-pay | Admitting: *Deleted

## 2022-02-02 DIAGNOSIS — E875 Hyperkalemia: Secondary | ICD-10-CM | POA: Diagnosis not present

## 2022-02-02 LAB — BASIC METABOLIC PANEL
BUN/Creatinine Ratio: 18 (ref 10–24)
BUN: 19 mg/dL (ref 8–27)
CO2: 28 mmol/L (ref 20–29)
Calcium: 9.8 mg/dL (ref 8.6–10.2)
Chloride: 94 mmol/L — ABNORMAL LOW (ref 96–106)
Creatinine, Ser: 1.03 mg/dL (ref 0.76–1.27)
Glucose: 118 mg/dL — ABNORMAL HIGH (ref 70–99)
Potassium: 5.2 mmol/L (ref 3.5–5.2)
Sodium: 133 mmol/L — ABNORMAL LOW (ref 134–144)
eGFR: 80 mL/min/{1.73_m2} (ref >59)

## 2022-02-02 NOTE — Telephone Encounter (Signed)
Cardiac Catheterization scheduled at [facility] for: Friday February 03, 2022 1 PM Arrival time and place: [place] Entrance A at: 11 AM  Nothing to eat after midnight prior to procedure, clear liquids until 5 AM day of procedure.  Medication instructions: -Usual morning medications can be taken with sips of water including aspirin 81 mg.  Confirmed patient has responsible adult to drive home post procedure and be with patient first 24 hours after arriving home.  Patient reports no new symptoms concerning for COVID-19 in the past 10 days.  Reviewed procedure instructions with patient's wife (DPR).

## 2022-02-03 ENCOUNTER — Other Ambulatory Visit: Payer: Self-pay

## 2022-02-03 ENCOUNTER — Encounter: Payer: Self-pay | Admitting: Cardiovascular Disease

## 2022-02-03 ENCOUNTER — Encounter (HOSPITAL_COMMUNITY)
Admission: RE | Disposition: A | Discharge status: Home / Self Care | Payer: Self-pay | Source: Ambulatory Visit | Attending: Cardiovascular Disease

## 2022-02-03 ENCOUNTER — Ambulatory Visit (HOSPITAL_COMMUNITY)
Admission: RE | Admit: 2022-02-03 | Discharge: 2022-02-03 | Disposition: A | Discharge status: Home / Self Care | Payer: Medicare Other | Source: Ambulatory Visit | Attending: Cardiovascular Disease | Admitting: Cardiovascular Disease

## 2022-02-03 DIAGNOSIS — E785 Hyperlipidemia, unspecified: Secondary | ICD-10-CM | POA: Insufficient documentation

## 2022-02-03 DIAGNOSIS — I2582 Chronic total occlusion of coronary artery: Secondary | ICD-10-CM | POA: Insufficient documentation

## 2022-02-03 DIAGNOSIS — I25119 Atherosclerotic heart disease of native coronary artery with unspecified angina pectoris: Secondary | ICD-10-CM

## 2022-02-03 DIAGNOSIS — I25118 Atherosclerotic heart disease of native coronary artery with other forms of angina pectoris: Secondary | ICD-10-CM

## 2022-02-03 DIAGNOSIS — Z01812 Encounter for preprocedural laboratory examination: Secondary | ICD-10-CM

## 2022-02-03 DIAGNOSIS — I10 Essential (primary) hypertension: Secondary | ICD-10-CM | POA: Diagnosis not present

## 2022-02-03 HISTORY — PX: INTRAVASCULAR PRESSURE WIRE/FFR STUDY: CATH118243

## 2022-02-03 HISTORY — PX: LEFT HEART CATH AND CORONARY ANGIOGRAPHY: CATH118249

## 2022-02-03 LAB — POCT ACTIVATED CLOTTING TIME: Activated Clotting Time: 444 seconds

## 2022-02-03 SURGERY — LEFT HEART CATH AND CORONARY ANGIOGRAPHY
Anesthesia: LOCAL

## 2022-02-03 MED ORDER — SODIUM CHLORIDE 0.9 % IV SOLN: 250.0000 mL | INTRAVENOUS | Status: DC | PRN

## 2022-02-03 MED ORDER — LABETALOL HCL 5 MG/ML IV SOLN: 10.0000 mg | INTRAVENOUS | Status: DC | PRN

## 2022-02-03 MED ORDER — VERAPAMIL HCL 2.5 MG/ML IV SOLN
INTRAVENOUS | Status: DC | PRN
  Administered 2022-02-03: 10 mL via INTRA_ARTERIAL

## 2022-02-03 MED ORDER — ACETAMINOPHEN 325 MG PO TABS: 650.0000 mg | ORAL_TABLET | ORAL | Status: DC | PRN

## 2022-02-03 MED ORDER — SODIUM CHLORIDE 0.9 % WEIGHT BASED INFUSION
3.0000 mL/kg/h | INTRAVENOUS | Status: AC
  Administered 2022-02-03: 3 mL/kg/h via INTRAVENOUS

## 2022-02-03 MED ORDER — SODIUM CHLORIDE 0.9 % IV SOLN: INTRAVENOUS | Status: AC

## 2022-02-03 MED ORDER — LIDOCAINE HCL (PF) 1 % IJ SOLN
INTRAMUSCULAR | Status: DC | PRN
  Administered 2022-02-03: 2 mL

## 2022-02-03 MED ORDER — SODIUM CHLORIDE 0.9 % WEIGHT BASED INFUSION: 1.0000 mL/kg/h | INTRAVENOUS | Status: DC

## 2022-02-03 MED ORDER — SODIUM CHLORIDE 0.9% FLUSH: 3.0000 mL | INTRAVENOUS | Status: DC | PRN

## 2022-02-03 MED ORDER — SODIUM CHLORIDE 0.9% FLUSH: 3.0000 mL | Freq: Two times a day (BID) | INTRAVENOUS | Status: DC

## 2022-02-03 MED ORDER — FENTANYL CITRATE (PF) 100 MCG/2ML IJ SOLN
INTRAMUSCULAR | Status: DC | PRN
  Administered 2022-02-03: 50 ug via INTRAVENOUS

## 2022-02-03 MED ORDER — VERAPAMIL HCL 2.5 MG/ML IV SOLN
INTRAVENOUS | Status: AC
  Filled 2022-02-03: qty 2

## 2022-02-03 MED ORDER — HEPARIN (PORCINE) IN NACL 1000-0.9 UT/500ML-% IV SOLN
INTRAVENOUS | Status: DC | PRN
  Administered 2022-02-03 (×2): 500 mL

## 2022-02-03 MED ORDER — HEPARIN SODIUM (PORCINE) 1000 UNIT/ML IJ SOLN
INTRAMUSCULAR | Status: DC | PRN
  Administered 2022-02-03 (×2): 5000 [IU] via INTRAVENOUS

## 2022-02-03 MED ORDER — FENTANYL CITRATE (PF) 100 MCG/2ML IJ SOLN
INTRAMUSCULAR | Status: AC
  Filled 2022-02-03: qty 2

## 2022-02-03 MED ORDER — MIDAZOLAM HCL 2 MG/2ML IJ SOLN
INTRAMUSCULAR | Status: DC | PRN
  Administered 2022-02-03: 2 mg via INTRAVENOUS

## 2022-02-03 MED ORDER — HEPARIN SODIUM (PORCINE) 1000 UNIT/ML IJ SOLN
INTRAMUSCULAR | Status: AC
  Filled 2022-02-03: qty 10

## 2022-02-03 MED ORDER — ONDANSETRON HCL 4 MG/2ML IJ SOLN: 4.0000 mg | Freq: Four times a day (QID) | INTRAMUSCULAR | Status: DC | PRN

## 2022-02-03 MED ORDER — IOHEXOL 350 MG/ML SOLN
INTRAVENOUS | Status: DC | PRN
  Administered 2022-02-03: 80 mL

## 2022-02-03 MED ORDER — ASPIRIN 81 MG PO CHEW: 81.0000 mg | CHEWABLE_TABLET | ORAL | Status: DC

## 2022-02-03 MED ORDER — MIDAZOLAM HCL 2 MG/2ML IJ SOLN
INTRAMUSCULAR | Status: AC
  Filled 2022-02-03: qty 2

## 2022-02-03 MED ORDER — HYDRALAZINE HCL 20 MG/ML IJ SOLN: 10.0000 mg | INTRAMUSCULAR | Status: DC | PRN

## 2022-02-03 SURGICAL SUPPLY — 12 items
CATH 5FR JL3.5 JR4 ANG PIG MP (CATHETERS) IMPLANT
CATH VISTA GUIDE 6FR XBLAD3.5 (CATHETERS) IMPLANT
DEVICE RAD COMP TR BAND LRG (VASCULAR PRODUCTS) IMPLANT
GLIDESHEATH SLEND SS 6F .021 (SHEATH) IMPLANT
GUIDEWIRE INQWIRE 1.5J.035X260 (WIRE) IMPLANT
GUIDEWIRE PRESSURE X 175 (WIRE) IMPLANT
INQWIRE 1.5J .035X260CM (WIRE) ×1
KIT ESSENTIALS PG (KITS) IMPLANT
KIT HEART LEFT (KITS) ×2 IMPLANT
PACK CARDIAC CATHETERIZATION (CUSTOM PROCEDURE TRAY) ×2 IMPLANT
TRANSDUCER W/STOPCOCK (MISCELLANEOUS) ×2 IMPLANT
TUBING CIL FLEX 10 FLL-RA (TUBING) ×2 IMPLANT

## 2022-02-03 NOTE — Progress Notes (Signed)
TR BAND REMOVAL  LOCATION:    right radial  DEFLATED PER PROTOCOL:    Yes.    TIME BAND OFF / DRESSING APPLIED: 02/03/22 at [place] ARRIVAL:    Level 0  SITE AFTER BAND REMOVAL:    Level 0  CIRCULATION SENSATION AND MOVEMENT:    Within Normal Limits   Yes.    COMMENTS:

## 2022-02-03 NOTE — Interval H&P Note (Signed)
History and Physical Interval Note:  02/03/2022 11:40 AM  [place]  has presented today for surgery, with the diagnosis of CAD with angina.  The various methods of treatment have been discussed with the patient and family. After consideration of risks, benefits and other options for treatment, the patient has consented to  Procedure(s): LEFT HEART CATH AND CORONARY ANGIOGRAPHY (N/A) as a surgical intervention.  The patient's history has been reviewed, patient examined, no change in status, stable for surgery.  I have reviewed the patient's chart and labs.  Questions were answered to the patient's satisfaction.    Cath Lab Visit (complete for each Cath Lab visit)  Clinical Evaluation Leading to the Procedure:   ACS: No.  Non-ACS:    Anginal Classification: CCS III  Anti-ischemic medical therapy: Maximal Therapy (2 or more classes of medications)  Non-Invasive Test Results: Intermediate-risk stress test findings: cardiac mortality 1-3%/year  Prior CABG: No previous CABG        Lauree Chandler

## 2022-02-03 NOTE — Telephone Encounter (Signed)
Per OV note on 12/27/21:  His recent hyperlipidemia.  Recent lipids reveal Total cholesterol of 216 HDL is 42 Triglyceride level is 237 LDL is 132.   Was on Simvastatin but is no longer   Will starteon rosuvastatin check labs in 3 months.  Patient did not have follow-up labs as of yet (due January 24). Advised patient to cut tablet in 1/2 and take only 1/2 tablet daily until MD responds early next week. Patient was previously taking Simvastatin '40mg'$  daily per chart.

## 2022-02-06 ENCOUNTER — Encounter (HOSPITAL_COMMUNITY): Payer: Self-pay | Admitting: Cardiovascular Disease

## 2022-02-09 ENCOUNTER — Encounter: Payer: Self-pay | Admitting: Cardiovascular Disease

## 2022-02-09 NOTE — Progress Notes (Deleted)
Office Visit    Patient Name: Victor Victor [name] Date of Encounter: 02/09/2022  Primary Care Provider:  Antony Contras, MD Primary Cardiologist:  None Primary Electrophysiologist: None  Chief Complaint    Victor Victor [name] is a 66 y.o. Victor [name] with PMH of CAD s/p LHC with 70% LAD, 20% RCA, 80% first marginal, 90% first diagonal, HLD, aortic atherosclerosis who presents today for 1 week follow-up post catheterization. Past Medical History    Past Medical History:  Diagnosis Date   Allergic rhinitis    Anxiety    Arteriosclerosis of thoracic aorta ([place])    Arthritis    "all over" (04/15/2013)   Back pain, chronic    BMI 32.0-32.9,adult    Cervicalgia    Chronic neck pain    COVID-19    DDD (degenerative disc disease), cervical    DDD (degenerative disc disease), lumbar    Diverticulosis    Elevated hemoglobin (HCC)    Fibromyalgia    GERD (gastroesophageal reflux disease)    Gout    Headache    High cholesterol    History of bronchitis    Hyperkalemia    Hypertension    Insomnia    Kidney disease with benign hypertension    Leg cramps    Right greater than left   Low back pain    Low testosterone    Melanoma (HCC)    Nose   MRSA infection    Neuropathy of both feet    Peripheral vascular disease (HCC)    Personal history of colonic polyps    Polycythemia    Pre-diabetes    Radiculopathy of lumbar region    Raynaud phenomenon    Recurrent sinus infections    Right sided sciatica    Sleep apnea    "don't use CPAP" (04/15/2013)   Tobacco dependence    Past Surgical History:  Procedure Laterality Date   ANTERIOR CERVICAL DECOMP/DISCECTOMY FUSION  2000's X3   "had 3 surgeries on my front neck" (04/15/2013)   ANTERIOR LAT LUMBAR FUSION Left 08/06/2017   Procedure: LEFT LUMBAR TWO-THREE ANTERIOR LATERAL LUMBAR FUSION WITH PERCUTANEOUS SCREWS;  Surgeon: Earnie Larsson, MD;  Location: [place];  Service: Neurosurgery;  Laterality: Left;   APPENDECTOMY  ~ 2010    BACK SURGERY     CARPAL TUNNEL RELEASE Left 1990's   CATARACT EXTRACTION, BILATERAL     COLONOSCOPY     ear drum surgery  1974   EYE SURGERY Right    FLEXIBLE SIGMOIDOSCOPY  12/21/2011   Procedure: FLEXIBLE SIGMOIDOSCOPY;  Surgeon: Lear Ng, MD;  Location: [facility];  Service: Endoscopy;  Laterality: N/A;   HAND TENDON SURGERY Left ~ 1978 X2-1980's X3   "had 5 ORs after laceration"   HOT HEMOSTASIS  12/21/2011   Procedure: HOT HEMOSTASIS (ARGON PLASMA COAGULATION/BICAP);  Surgeon: Lear Ng, MD;  Location: [facility];  Service: Endoscopy;  Laterality: N/A;   INGUINAL HERNIA REPAIR Left 04/15/2013   Procedure: LAPAROSCOPIC REPAIR OF INCARCERATED LEFT INGUINAL HERNIA;  Surgeon: Gayland Curry, MD;  Location: [place];  Service: General;  Laterality: Left;   INGUINAL HERNIA REPAIR Bilateral 1970's - [place] Left 04/15/2013   INSERTION OF MESH Left 04/15/2013   Procedure: INSERTION OF MESH;  Surgeon: Gayland Curry, MD;  Location: [place];  Service: General;  Laterality: Left;   INTRAVASCULAR PRESSURE WIRE/FFR STUDY N/A 02/03/2022   Procedure: INTRAVASCULAR PRESSURE WIRE/FFR STUDY;  Surgeon: Burnell Blanks, MD;  Location: [place] [facility];  Service: Cardiovascular;  Laterality: N/A;   JOINT REPLACEMENT     right knee   KNEE ARTHROSCOPY Right 1990's   LAPAROSCOPY N/A 04/15/2013   Procedure: LAPAROSCOPY DIAGNOSTIC;  Surgeon: Gayland Curry, MD;  Location: [place];  Service: General;  Laterality: N/A;   LEFT HEART CATH AND CORONARY ANGIOGRAPHY N/A 02/03/2022   Procedure: LEFT HEART CATH AND CORONARY ANGIOGRAPHY;  Surgeon: Burnell Blanks, MD;  Location: [facility];  Service: Cardiovascular;  Laterality: N/A;   LUMBAR LAMINECTOMY/DECOMPRESSION MICRODISCECTOMY Right 07/22/2015   Procedure: Right Lumbar Two-Three Microdiscectomy;  Surgeon: Earnie Larsson, MD;  Location: [facility];  Service: Neurosurgery;  Laterality: Right;   LUMBAR  LAMINECTOMY/DECOMPRESSION MICRODISCECTOMY Right 09/05/2016   Procedure: Microdiscectomy - right - T12-L1;  Surgeon: Earnie Larsson, MD;  Location: [place];  Service: Neurosurgery;  Laterality: Right;   NASAL SEPTUM SURGERY Bilateral 2014   "removed polyps & infection"   POLYPECTOMY  01/10/2011   Procedure: POLYPECTOMY;  Surgeon: Lear Ng, MD;  Location: [facility];  Service: Endoscopy;  Laterality: N/A;   POSTERIOR CERVICAL LAMINECTOMY Left 05/24/2020   Procedure: Laminectomy and Foraminotomy - left - Cervical two-Cervical three;  Surgeon: Earnie Larsson, MD;  Location: [place];  Service: Neurosurgery;  Laterality: Left;   POSTERIOR LUMBAR FUSION  11/1995   "ray cages" (04/15/2013)   SHOULDER ARTHROSCOPY Bilateral    "twice each"   TONSILLECTOMY  1960's   TOTAL KNEE ARTHROPLASTY Right 02/24/2014   Procedure: TOTAL RIGHT KNEE ARTHROPLASTY;  Surgeon: Mauri Pole, MD;  Location: [facility];  Service: Orthopedics;  Laterality: Right;   TOTAL SHOULDER ARTHROPLASTY Left 09/18/2019   Procedure: TOTAL SHOULDER ARTHROPLASTY;  Surgeon: Justice Britain, MD;  Location: [facility];  Service: Orthopedics;  Laterality: Left;  125mn   TOTAL SHOULDER ARTHROPLASTY Right 11/04/2020   Procedure: TOTAL SHOULDER ARTHROPLASTY;  Surgeon: SJustice Britain MD;  Location: [facility];  Service: Orthopedics;  Laterality: Right;  1230m   UPPER GI ENDOSCOPY     VASECTOMY  1988   WISDOM TOOTH EXTRACTION  2000's    Allergies  Allergies  Allergen Reactions   Flexeril [Cyclobenzaprine] Other (See Comments)    Makes patient irritable   Tetracyclines & Related Other (See Comments)    " makes me angry"   Doxycycline Other (See Comments)    Upset stomach   Niacin And Related Itching    History of Present Illness    Victor Victor [name] with the above mention past medical history who presents today for post LHC follow-up.  Mr. Victor Victor [name] initially seen by Dr. NaAcie Fredricksonn 12/27/2021 for history of HLD and  coronary calcifications.  Patient had a calcium score of 2535 with incidental calcium on his aorta and three-vessel coronary artery calcification.  He was doing well during visit but did endorse an occasional funny feeling in the central part of his chest.  He underwent a cardiac PET scan that revealed ischemia in the inferior wall and intermediate risk study patient was started on ASA 81 mg and as needed nitroglycerin was provided.  He was seen on 01/31/2022 by JeVenia CarbonNP for review of PET scan results and to arrange LHC.  During visit patient's blood pressures were elevated at 165/96, 150/88 on recheck.  Considerations were made to titrate amlodipine up at next visit if blood pressures remain elevated.  He was concerned about lying still for the cardiac cath due to chronic  leg pain.  LHC was performed on 02/03/2022 that revealed severe three-vessel CAD with recommendation of outpatient echo and referral to CT surgery for CABG or alternative option of medical management due to patient being asymptomatic.  Since last being seen in the office patient reports***.  Patient denies chest pain, palpitations, dyspnea, PND, orthopnea, nausea, vomiting, dizziness, syncope, edema, weight gain, or early satiety.   ***Notes:  Home Medications    Current Outpatient Medications  Medication Sig Dispense Refill   acetaminophen (TYLENOL) 500 MG tablet Take 500 mg by mouth every 8 (eight) hours as needed for moderate pain.     allopurinol (ZYLOPRIM) 300 MG tablet TAKE 1 AND 1/2 TABLETS DAILY BY MOUTH 135 tablet 0   ALPRAZolam (XANAX) 1 MG tablet Take 1 mg by mouth 2 (two) times daily as needed for anxiety.     amLODipine (NORVASC) 2.5 MG tablet Take 2.5 mg by mouth daily.     aspirin EC 81 MG tablet Take 1 tablet (81 mg total) by mouth daily. Swallow whole. 90 tablet 3   atenolol (TENORMIN) 100 MG tablet Take 100 mg by mouth every morning.     b complex vitamins tablet Take 1 tablet by mouth in the morning.      Biotin 5000 MCG CAPS Take 5,000 mcg by mouth in the morning.     Calcium Carbonate (CALCIUM 600 PO) Take 600 mg by mouth in the morning.     carboxymethylcellulose (REFRESH PLUS) 0.5 % SOLN Place 1 drop into both eyes 2 (two) times daily as needed (Dry eyes).     colchicine 0.6 MG tablet TAKE 1 TABLET (0.6 MG TOTAL) BY MOUTH 2 (TWO) TIMES DAILY AS NEEDED. 180 tablet 0   cyclobenzaprine (FLEXERIL) 10 MG tablet Take 10 mg by mouth 2 (two) times daily as needed for muscle spasms.     fish oil-omega-3 fatty acids 1000 MG capsule Take 1 g by mouth in the morning, at noon, and at bedtime.     fluticasone (FLONASE) 50 MCG/ACT nasal spray Place 1 spray into both nostrils 2 (two) times daily. For congestion     GINSENG PO Take 400 mg by mouth in the morning.     guaiFENesin (MUCINEX) 600 MG 12 hr tablet Take 600 mg by mouth at bedtime as needed (Congestion).     hydrocortisone cream 1 % Apply 1 application topically daily as needed for itching.     HYDROmorphone (DILAUDID) 4 MG tablet Take 4 mg by mouth every 8 (eight) hours as needed.     ibuprofen (ADVIL) 200 MG tablet Take 200 mg by mouth 2 (two) times daily as needed (pain.).     magnesium gluconate (MAGONATE) 500 MG tablet Take 500 mg by mouth in the morning.     Melatonin 10 MG TABS Take 10 mg by mouth at bedtime as needed (sleep).     Methylsulfonylmethane (MSM) 1000 MG CAPS Take 1,000 mg by mouth in the morning.     Multiple Vitamins-Minerals (MULTIVITAMIN WITH MINERALS) tablet Take 1 tablet by mouth daily.     neomycin-bacitracin-polymyxin (NEOSPORIN) ointment Apply 1 application topically daily as needed for wound care.     nitroGLYCERIN (NITROSTAT) 0.4 MG SL tablet Dissolve 1 tablet under the tongue every 5 minutes as needed for chest pain. Max of 3 doses, then 911. 25 tablet 6   NONFORMULARY OR COMPOUNDED ITEM Apply 1 g topically daily as needed (foot discomfort/pain.). Doxepin 5%, Amantadine 3%, Pentoxifylline 3%, Lamotrigine 2%, Sertraline  3%, Gabapentin  6% PCCA Lipoderm Base Cream     olmesartan (BENICAR) 40 MG tablet Take 40 mg by mouth in the morning.     omeprazole (PRILOSEC) 40 MG capsule Take 40 mg by mouth in the morning.     Psyllium (METAMUCIL FIBER PO) Take 1 Scoop by mouth daily in the afternoon.     RAPAFLO 8 MG CAPS capsule Take 8 mg by mouth at bedtime.  3   rosuvastatin (CRESTOR) 20 MG tablet Take 1 tablet (20 mg total) by mouth daily. (Patient taking differently: Take 20 mg by mouth every evening.) 90 tablet 3   selenium 200 MCG TABS tablet Take 200 mcg by mouth in the morning.     sildenafil (VIAGRA) 100 MG tablet Take 100 mg by mouth daily as needed for erectile dysfunction. Per patient taking 1/2 tablet (50 mg) as needed     sodium chloride (OCEAN) 0.65 % SOLN nasal spray Place 1 spray into both nostrils as needed for congestion.     testosterone cypionate (DEPOTESTOTERONE CYPIONATE) 200 MG/ML injection Inject 100 mg into the muscle every 14 (fourteen) days. 0.5 mL     Turmeric 500 MG CAPS Take 500 mg by mouth in the morning.     venlafaxine (EFFEXOR-XR) 150 MG 24 hr capsule Take 150 mg by mouth daily with breakfast.     Vitamin D3 (VITAMIN D) 25 MCG tablet Take 1,000 Units by mouth in the morning.     vitamin E 400 UNIT capsule Take 400 Units by mouth in the morning.     No current facility-administered medications for this visit.     Review of Systems  Please see the history of present illness.    (+)*** (+)***  All other systems reviewed and are otherwise negative except as noted above.  Physical Exam    Wt Readings from Last 3 Encounters:  02/03/22 221 lb (100.2 kg)  01/31/22 224 lb (101.6 kg)  12/27/21 219 lb 9.6 oz (99.6 kg)   ON:GEXBM were no vitals filed for this visit.,There is no height or weight on file to calculate BMI.  Constitutional:      Appearance: Healthy appearance. Not in distress.  Neck:     Vascular: JVD normal.  Pulmonary:     Effort: Pulmonary effort is normal.      Breath sounds: No wheezing. No rales. Diminished in the bases Cardiovascular:     Normal rate. Regular rhythm. Normal S1. Normal S2.      Murmurs: There is no murmur.  Edema:    Peripheral edema absent.  Abdominal:     Palpations: Abdomen is soft non tender. There is no hepatomegaly.  Skin:    General: Skin is warm and dry.  Neurological:     General: No focal deficit present.     Mental Status: Alert and oriented to person, place and time.     Cranial Nerves: Cranial nerves are intact.  EKG/LABS/Other Studies Reviewed    ECG personally reviewed by me today - ***  Risk Assessment/Calculations:   {Does this patient have ATRIAL FIBRILLATION?:[id number]}        Lab Results  Component Value Date   WBC 9.2 01/31/2022   HGB 17.4 01/31/2022   HCT 51.7 (H) 01/31/2022   MCV 94 01/31/2022   PLT 225 01/31/2022   Lab Results  Component Value Date   CREATININE 1.03 02/02/2022   BUN 19 02/02/2022   NA 133 (L) 02/02/2022   K 5.2 02/02/2022   CL 94 (L) 02/02/2022  CO2 28 02/02/2022   Lab Results  Component Value Date   ALT 32 05/20/2020   AST 38 05/20/2020   ALKPHOS 46 05/20/2020   BILITOT 1.2 05/20/2020   No results found for: "CHOL", "HDL", "LDLCALC", "LDLDIRECT", "TRIG", "CHOLHDL"  Lab Results  Component Value Date   HGBA1C 6.0 (H) 10/20/2020    Assessment & Plan    1.  Coronary artery disease: -Patient underwent a cardiac PET scan that revealed ischemia and LHC was performed that showed severe three-vessel CAD -Today patient reports*** -Continue current GDMT with ASA 81 mg, atenolol 100 mg, Crestor 20 mg  2.  Essential hypertension: -Patient's blood pressure today was*** -Continue atenolol, amlodipine,  3.  Hyperlipidemia: -Patient's last LDL was 132 -Continue Crestor 20 mg with LFTs and lipids to be recollected in 3 months.  4.  Obstructive sleep apnea: -Patient does not use CPAP      Disposition: Follow-up with None or APP in *** months {Are you  ordering a CV Procedure (e.g. stress test, cath, DCCV, TEE, etc)?   Press F2        :[id number]}   Medication Adjustments/Labs and Tests Ordered: Current medicines are reviewed at length with the patient today.  Concerns regarding medicines are outlined above.   Signed, Mable Fill, Marissa Nestle, NP 02/09/2022, 6:37 PM  [facility]  Note:  This document was prepared using Dragon voice recognition software and may include unintentional dictation errors.

## 2022-02-10 ENCOUNTER — Ambulatory Visit: Payer: Medicare Other | Admitting: Nurse Practitioner

## 2022-02-10 DIAGNOSIS — I25119 Atherosclerotic heart disease of native coronary artery with unspecified angina pectoris: Secondary | ICD-10-CM

## 2022-02-12 ENCOUNTER — Encounter: Payer: Self-pay | Admitting: Cardiovascular Disease

## 2022-02-12 NOTE — Progress Notes (Unsigned)
Cardiology Office Note:    Date:  02/13/2022   ID:  Victor [name], DOB [birth year], MRN [id number]  PCP:  Antony Contras, [place] [facility] Cardiologist:  Yehya Brendle    Referring MD: Antony Contras, MD   Chief Complaint  Patient presents with   Coronary Artery Disease         History of Present Illness:    Victor [name] is a 66 y.o. male with a hx of hyperlipidemia and calcifications of the aorta.  We are asked to see him today for further evaluation.  Seen with wife, Theodis Kinsel ( patient of mine)  He thinks I saw him for a pre op clearance in 1990s.  He was found to have some incidental calcium on his aorta.  He had a coronary calcium test which revealed a coronary calcium score of 2535 which places him in the 98th percentile for age/sex matched controls.  No chest pain  Has a funny feeling in his chest ,  central feeling .   Not necessarily with activity Has lots of feet , leg pain ,  Does not walk much ,  can do a stationary bike   Has occasional "sinking " spells - may be hypoglycemia  Feels better after drinking a coke .     His recent hyperlipidemia.  Recent lipids reveal Total cholesterol of 216 HDL is 42 Triglyceride level is 237 LDL is 132.  Was on Simvastatin but is no longer   Will starteon rosuvastatin check labs in 3 months.  Dec. 18, 2023 Victor [name] is seen after his abnormal PET scan and subsequent cath .  He had a coronary calcium test which revealed a coronary calcium score of 2535 which places him in the 98th percentile for age/sex matched controls.  Cath - RCA : occluded prox LAD - 70% prox, D1 90% LCx - 80 % mid stenosis,  OM1 has a 80% stenosis   EF is 52% by PET scan (mildly reduced )   refer to [facility] for consideration for CABG   Has been generally intolerant to rosuvastatin  Is not tolerating the rosuvastatin           Past Medical History:  Diagnosis Date   Allergic rhinitis    Anxiety     Arteriosclerosis of thoracic aorta (HCC)    Arthritis    "all over" (04/15/2013)   Back pain, chronic    BMI 32.0-32.9,adult    Cervicalgia    Chronic neck pain    COVID-19    DDD (degenerative disc disease), cervical    DDD (degenerative disc disease), lumbar    Diverticulosis    Elevated hemoglobin (HCC)    Fibromyalgia    GERD (gastroesophageal reflux disease)    Gout    Headache    High cholesterol    History of bronchitis    Hyperkalemia    Hypertension    Insomnia    Kidney disease with benign hypertension    Leg cramps    Right greater than left   Low back pain    Low testosterone    Melanoma (HCC)    Nose   MRSA infection    Neuropathy of both feet    Peripheral vascular disease ([place])    Personal history of colonic polyps    Polycythemia    Pre-diabetes    Radiculopathy of lumbar region    Raynaud phenomenon    Recurrent sinus infections    Right sided sciatica  Sleep apnea    "don't use CPAP" (04/15/2013)   Tobacco dependence     Past Surgical History:  Procedure Laterality Date   ANTERIOR CERVICAL DECOMP/DISCECTOMY FUSION  2000's X3   "had 3 surgeries on my front neck" (04/15/2013)   ANTERIOR LAT LUMBAR FUSION Left 08/06/2017   Procedure: LEFT LUMBAR TWO-THREE ANTERIOR LATERAL LUMBAR FUSION WITH PERCUTANEOUS SCREWS;  Surgeon: Earnie Larsson, MD;  Location: [facility];  Service: Neurosurgery;  Laterality: Left;   APPENDECTOMY  ~ 2010   BACK SURGERY     CARPAL TUNNEL RELEASE Left 1990's   CATARACT EXTRACTION, BILATERAL     COLONOSCOPY     ear drum surgery  1974   EYE SURGERY Right    FLEXIBLE SIGMOIDOSCOPY  12/21/2011   Procedure: FLEXIBLE SIGMOIDOSCOPY;  Surgeon: Lear Ng, MD;  Location: [facility];  Service: Endoscopy;  Laterality: N/A;   HAND TENDON SURGERY Left ~ 1978 X2-1980's X3   "had 5 ORs after laceration"   HOT HEMOSTASIS  12/21/2011   Procedure: HOT HEMOSTASIS (ARGON PLASMA COAGULATION/BICAP);  Surgeon: Lear Ng, MD;   Location: [facility];  Service: Endoscopy;  Laterality: N/A;   INGUINAL HERNIA REPAIR Left 04/15/2013   Procedure: LAPAROSCOPIC REPAIR OF INCARCERATED LEFT INGUINAL HERNIA;  Surgeon: Gayland Curry, MD;  Location: [facility];  Service: General;  Laterality: Left;   INGUINAL HERNIA REPAIR Bilateral 1970's - [place] Left 04/15/2013   INSERTION OF MESH Left 04/15/2013   Procedure: INSERTION OF MESH;  Surgeon: Gayland Curry, MD;  Location: [facility];  Service: General;  Laterality: Left;   INTRAVASCULAR PRESSURE WIRE/FFR STUDY N/A 02/03/2022   Procedure: INTRAVASCULAR PRESSURE WIRE/FFR STUDY;  Surgeon: Burnell Blanks, MD;  Location: [facility];  Service: Cardiovascular;  Laterality: N/A;   JOINT REPLACEMENT     right knee   KNEE ARTHROSCOPY Right 1990's   LAPAROSCOPY N/A 04/15/2013   Procedure: LAPAROSCOPY DIAGNOSTIC;  Surgeon: Gayland Curry, MD;  Location: [facility];  Service: General;  Laterality: N/A;   LEFT HEART CATH AND CORONARY ANGIOGRAPHY N/A 02/03/2022   Procedure: LEFT HEART CATH AND CORONARY ANGIOGRAPHY;  Surgeon: Burnell Blanks, MD;  Location: [facility];  Service: Cardiovascular;  Laterality: N/A;   LUMBAR LAMINECTOMY/DECOMPRESSION MICRODISCECTOMY Right 07/22/2015   Procedure: Right Lumbar Two-Three Microdiscectomy;  Surgeon: Earnie Larsson, MD;  Location: [facility];  Service: Neurosurgery;  Laterality: Right;   LUMBAR LAMINECTOMY/DECOMPRESSION MICRODISCECTOMY Right 09/05/2016   Procedure: Microdiscectomy - right - T12-L1;  Surgeon: Earnie Larsson, MD;  Location: [facility];  Service: Neurosurgery;  Laterality: Right;   NASAL SEPTUM SURGERY Bilateral 2014   "removed polyps & infection"   POLYPECTOMY  01/10/2011   Procedure: POLYPECTOMY;  Surgeon: Lear Ng, MD;  [facility];  Service: Endoscopy;  Laterality: N/A;   POSTERIOR CERVICAL LAMINECTOMY Left 05/24/2020   Procedure: Laminectomy and Foraminotomy - left - Cervical  two-Cervical three;  Surgeon: Earnie Larsson, MD;  Location: [facility];  Service: Neurosurgery;  Laterality: Left;   POSTERIOR LUMBAR FUSION  11/1995   "ray cages" (04/15/2013)   SHOULDER ARTHROSCOPY Bilateral    "twice each"   TONSILLECTOMY  1960's   TOTAL KNEE ARTHROPLASTY Right 02/24/2014   Procedure: TOTAL RIGHT KNEE ARTHROPLASTY;  Surgeon: Mauri Pole, MD;  [facility];  Service: Orthopedics;  Laterality: Right;   TOTAL SHOULDER ARTHROPLASTY Left 09/18/2019   Procedure: TOTAL SHOULDER ARTHROPLASTY;  Surgeon: Justice Britain, MD;  [facility];  Service: Orthopedics;  Laterality: Left;  167mn   TOTAL SHOULDER ARTHROPLASTY Right 11/04/2020   Procedure: TOTAL SHOULDER ARTHROPLASTY;  Surgeon: SJustice Britain MD;  [facility];  Service: Orthopedics;  Laterality: Right;  1236m   UPPER GI ENDOSCOPY     VASECTOMY  1988   WISDOM TOOTH EXTRACTION  2000's    Current Medications: Current Meds  Medication Sig   acetaminophen (TYLENOL) 500 MG tablet Take 500 mg by mouth every 8 (eight) hours as needed for moderate pain.   allopurinol (ZYLOPRIM) 300 MG tablet TAKE 1 AND 1/2 TABLETS DAILY BY MOUTH   ALPRAZolam (XANAX) 1 MG tablet Take 1 mg by mouth 2 (two) times daily as needed for anxiety.   amLODipine (NORVASC) 2.5 MG tablet Take 2.5 mg by mouth daily.   aspirin EC 81 MG tablet Take 1 tablet (81 mg total) by mouth daily. Swallow whole.   atenolol (TENORMIN) 100 MG tablet Take 100 mg by mouth every morning.   b complex vitamins tablet Take 1 tablet by mouth in the morning.   Biotin 5000 MCG CAPS Take 5,000 mcg by mouth in the morning.   Calcium Carbonate (CALCIUM 600 PO) Take 600 mg by mouth in the morning.   carboxymethylcellulose (REFRESH PLUS) 0.5 % SOLN Place 1 drop into both eyes 2 (two) times daily as needed (Dry eyes).   colchicine 0.6 MG tablet TAKE 1 TABLET (0.6 MG TOTAL) BY MOUTH 2 (TWO) TIMES DAILY AS NEEDED.   cyclobenzaprine (FLEXERIL) 10 MG tablet Take 10 mg by mouth 2 (two)  times daily as needed for muscle spasms.   fish oil-omega-3 fatty acids 1000 MG capsule Take 1 g by mouth in the morning, at noon, and at bedtime.   fluticasone (FLONASE) 50 MCG/ACT nasal spray Place 1 spray into both nostrils 2 (two) times daily. For congestion   GINSENG PO Take 400 mg by mouth in the morning.   guaiFENesin (MUCINEX) 600 MG 12 hr tablet Take 600 mg by mouth at bedtime as needed (Congestion).   hydrocortisone cream 1 % Apply 1 application topically daily as needed for itching.   HYDROmorphone (DILAUDID) 4 MG tablet Take 4 mg by mouth every 8 (eight) hours as needed.   ibuprofen (ADVIL) 200 MG tablet Take 200 mg by mouth 2 (two) times daily as needed (pain.).   magnesium gluconate (MAGONATE) 500 MG tablet Take 500 mg by mouth in the morning.   Melatonin 10 MG TABS Take 10 mg by mouth at bedtime as needed (sleep).   Methylsulfonylmethane (MSM) 1000 MG CAPS Take 1,000 mg by mouth in the morning.   Multiple Vitamins-Minerals (MULTIVITAMIN WITH MINERALS) tablet Take 1 tablet by mouth daily.   neomycin-bacitracin-polymyxin (NEOSPORIN) ointment Apply 1 application topically daily as needed for wound care.   nitroGLYCERIN (NITROSTAT) 0.4 MG SL tablet Dissolve 1 tablet under the tongue every 5 minutes as needed for chest pain. Max of 3 doses, then 911.   NONFORMULARY OR COMPOUNDED ITEM Apply 1 g topically daily as needed (foot discomfort/pain.). Doxepin 5%, Amantadine 3%, Pentoxifylline 3%, Lamotrigine 2%, Sertraline 3%, Gabapentin 6% PCCA Lipoderm Base Cream   olmesartan (BENICAR) 40 MG tablet Take 40 mg by mouth in the morning.   omeprazole (PRILOSEC) 40 MG capsule Take 40 mg by mouth in the morning.   Psyllium (METAMUCIL FIBER PO) Take 1 Scoop by mouth daily in the afternoon.   RAPAFLO 8 MG CAPS capsule Take 8 mg by mouth at bedtime.   selenium 200 MCG TABS tablet  Take 200 mcg by mouth in the morning.   sildenafil (VIAGRA) 100 MG tablet Take 100 mg by mouth daily as needed for erectile  dysfunction. Per patient taking 1/2 tablet (50 mg) as needed   sodium chloride (OCEAN) 0.65 % SOLN nasal spray Place 1 spray into both nostrils as needed for congestion.   testosterone cypionate (DEPOTESTOTERONE CYPIONATE) 200 MG/ML injection Inject 100 mg into the muscle every 14 (fourteen) days. 0.5 mL   Turmeric 500 MG CAPS Take 500 mg by mouth in the morning.   venlafaxine (EFFEXOR-XR) 150 MG 24 hr capsule Take 150 mg by mouth daily with breakfast.   Vitamin D3 (VITAMIN D) 25 MCG tablet Take 1,000 Units by mouth in the morning.   vitamin E 400 UNIT capsule Take 400 Units by mouth in the morning.   [DISCONTINUED] rosuvastatin (CRESTOR) 20 MG tablet Take 1 tablet (20 mg total) by mouth daily. (Patient taking differently: Take 20 mg by mouth every evening.)     Allergies:   Flexeril [cyclobenzaprine], Tetracyclines & related, Doxycycline, and Niacin and related   Social History   Socioeconomic History   Marital status: Married    Spouse name: Not on file   Number of children: Not on file   Years of education: Not on file   Highest education level: Not on file  Occupational History   Not on file  Tobacco Use   Smoking status: Former    Packs/day: 0.10    Years: 15.00    Total pack years: 1.50    Types: Cigarettes, Cigars    Quit date: 01/08/2010    Years since quitting: 12.1    Passive exposure: Never   Smokeless tobacco: Never   Tobacco comments:    04/15/2013 "smoked ~ 1 pack/month"  Vaping Use   Vaping Use: Never used  Substance and Sexual Activity   Alcohol use: Yes    Alcohol/week: 5.0 standard drinks of alcohol    Types: 1 Glasses of wine, 4 Cans of beer per week    Comment: 04/15/2013 "glass of wine w/dinner 2-3 times/month;  plus 3-4 beers/wk"   Drug use: No   Sexual activity: Yes  Other Topics Concern   Not on file  Social History Narrative   Not on file   Social Determinants of Health   Financial Resource Strain: Not on file  Food Insecurity: Not on file   Transportation Needs: Not on file  Physical Activity: Not on file  Stress: Not on file  Social Connections: Not on file     Family History: The patient's family history includes Allergies in his daughter; Alzheimer's disease in his mother; Cancer in his mother; Heart Problems in his mother; Hypertension in his father; Interstitial cystitis in his daughter.  ROS:   Please see the history of present illness.     All other systems reviewed and are negative.  EKGs/Labs/Other Studies Reviewed:    The following studies were reviewed today:   EKG:   Recent Labs: 01/31/2022: Hemoglobin 17.4; Platelets 225 02/02/2022: BUN 19; Creatinine, Ser 1.03; Potassium 5.2; Sodium 133  Recent Lipid Panel No results found for: "CHOL", "TRIG", "HDL", "CHOLHDL", "VLDL", "LDLCALC", "LDLDIRECT"   Risk Assessment/Calculations:                Physical Exam:    Physical Exam: Blood pressure 114/80, pulse 80, height '5\' 8"'$  (1.727 m), weight 227 lb 9.6 oz (103.2 kg), SpO2 98 %.       GEN:  Well nourished, well  developed in no acute distress HEENT: Normal NECK: No JVD; No carotid bruits LYMPHATICS: No lymphadenopathy CARDIAC: RRR , no murmurs, rubs, gallops RESPIRATORY:  Clear to auscultation without rales, wheezing or rhonchi  ABDOMEN: Soft, non-tender, non-distended MUSCULOSKELETAL:  No edema; No deformity  SKIN: Warm and dry NEUROLOGIC:  Alert and oriented x 3   ASSESSMENT:    1. Coronary artery disease involving native coronary artery of native heart with angina pectoris ([place])   2. Coronary artery calcification   3. Essential hypertension   4. Mixed hyperlipidemia     PLAN:     Coronary artery disease: Victor [name] was found to have three-vessel coronary artery disease.  He is got moderate to severe lesion in his left anterior descending artery, severe disease in the circumflex and marginal vessels, occluded RCA. Will get an echocardiogram to assess his LV function.  Will refer him to  [facility].   2.  Hyperlipidemia: He is not tolerating the rosuvastatin.  We will refer him to the lipid clinic for consideration of a PCSK9 inhibitor.       2.  Hyperlipidemia:           Medication Adjustments/Labs and Tests Ordered: Current medicines are reviewed at length with the patient today.  Concerns regarding medicines are outlined above.  Orders Placed This Encounter  Procedures   AMB Referral to [facility] Pharm-D   Ambulatory referral to Cardiothoracic Surgery   ECHOCARDIOGRAM COMPLETE   No orders of the defined types were placed in this encounter.   Patient Instructions  Medication Instructions:  STOP Rosuvastatin/Crestor *If you need a refill on your cardiac medications before your next appointment, please call your pharmacy*   Lab Work: NONE If you have labs (blood work) drawn today and your tests are completely normal, you will receive your results only by: [place] (if you have MyChart) OR A paper copy in the mail If you have any lab test that is abnormal or we need to change your treatment, we will call you to review the results.   Testing/Procedures: ECHO Your physician has requested that you have an echocardiogram. Echocardiography is a painless test that uses sound waves to create images of your heart. It provides your doctor with information about the size and shape of your heart and how well your heart's chambers and valves are working. This procedure takes approximately one hour. There are no restrictions for this procedure. Please do NOT wear cologne, perfume, aftershave, or lotions (deodorant is allowed). Please arrive 15 minutes prior to your appointment time.  Ambulatory referral to [facility]  Ambulatory referral to [place]: At [facility], you and your health needs are our priority.  As part of our continuing mission to provide you with exceptional heart care, we have created  designated Provider Care Teams.  These Care Teams include your primary Cardiologist (physician) and Advanced Practice Providers (APPs -  Physician Assistants and Nurse Practitioners) who all work together to provide you with the care you need, when you need it.  We recommend signing up for the patient portal called "MyChart".  Sign up information is provided on this After Visit Summary.  MyChart is used to connect with patients for Virtual Visits (Telemedicine).  Patients are able to view lab/test results, encounter notes, upcoming appointments, etc.  Non-urgent messages can be sent to your provider as well.   To learn more about what you can do with MyChart, go to [email or web address].  Your next appointment:   3 month(s)  The format for your next appointment:   In Person  Provider:   Mertie Moores, MD    Important Information About Sugar         Signed, Mertie Moores, MD  02/13/2022 6:37 PM    [place]

## 2022-02-13 ENCOUNTER — Ambulatory Visit: Payer: Medicare Other | Attending: Nurse Practitioner | Admitting: Cardiovascular Disease

## 2022-02-13 ENCOUNTER — Encounter: Payer: Self-pay | Admitting: Cardiovascular Disease

## 2022-02-13 VITALS — BP 114/80 | HR 80 | Ht 68.0 in | Wt 227.6 lb

## 2022-02-13 DIAGNOSIS — I10 Essential (primary) hypertension: Secondary | ICD-10-CM | POA: Diagnosis not present

## 2022-02-13 DIAGNOSIS — E782 Mixed hyperlipidemia: Secondary | ICD-10-CM

## 2022-02-13 DIAGNOSIS — I25119 Atherosclerotic heart disease of native coronary artery with unspecified angina pectoris: Secondary | ICD-10-CM | POA: Diagnosis not present

## 2022-02-13 DIAGNOSIS — I2510 Atherosclerotic heart disease of native coronary artery without angina pectoris: Secondary | ICD-10-CM

## 2022-02-13 DIAGNOSIS — I2584 Coronary atherosclerosis due to calcified coronary lesion: Secondary | ICD-10-CM | POA: Diagnosis not present

## 2022-02-13 NOTE — Patient Instructions (Signed)
Medication Instructions:  STOP Rosuvastatin/Crestor *If you need a refill on your cardiac medications before your next appointment, please call your pharmacy*   Lab Work: NONE If you have labs (blood work) drawn today and your tests are completely normal, you will receive your results only by: [place] (if you have MyChart) OR A paper copy in the mail If you have any lab test that is abnormal or we need to change your treatment, we will call you to review the results.   Testing/Procedures: ECHO Your physician has requested that you have an echocardiogram. Echocardiography is a painless test that uses sound waves to create images of your heart. It provides your doctor with information about the size and shape of your heart and how well your heart's chambers and valves are working. This procedure takes approximately one hour. There are no restrictions for this procedure. Please do NOT wear cologne, perfume, aftershave, or lotions (deodorant is allowed). Please arrive 15 minutes prior to your appointment time.  Ambulatory referral to [facility]  Ambulatory referral to [place]: At [facility], you and your health needs are our priority.  As part of our continuing mission to provide you with exceptional heart care, we have created designated Provider Care Teams.  These Care Teams include your primary Cardiologist (physician) and Advanced Practice Providers (APPs -  Physician Assistants and Nurse Practitioners) who all work together to provide you with the care you need, when you need it.  We recommend signing up for the patient portal called "MyChart".  Sign up information is provided on this After Visit Summary.  MyChart is used to connect with patients for Virtual Visits (Telemedicine).  Patients are able to view lab/test results, encounter notes, upcoming appointments, etc.  Non-urgent messages can be sent to your provider as well.    To learn more about what you can do with MyChart, go to [email or web address].    Your next appointment:   3 month(s)  The format for your next appointment:   In Person  Provider:   Mertie Moores, MD    Important Information About Sugar

## 2022-02-15 ENCOUNTER — Other Ambulatory Visit: Payer: Self-pay | Admitting: Rheumatology

## 2022-02-15 NOTE — Telephone Encounter (Signed)
Next Visit: Due April 2024. Message sent to the front to schedule.   Last Visit: 11/29/2021  Last Fill: 09/15/2021  DX: Idiopathic chronic gout of multiple sites without tophus   Current Dose per office note 11/29/2021: allopurinol 450 mg po daily   Labs: 01/31/2022 Hct 51.7, Potassium 6.7, Calcium 10.4 11/10/2021 Uric Acid 4.8  Okay to refill Allopurinol?

## 2022-02-15 NOTE — Telephone Encounter (Signed)
Please schedule patient a follow up visit. Patient due April 2024. Thanks!

## 2022-02-15 NOTE — Telephone Encounter (Signed)
Reached out to patient to schedule follow up appointment. Patient states he has too much going on right now to schedule and will call back in April. Advised patient that Dr. Arlean Hopping April schedule is almost full so to call back as soon as he can to schedule.

## 2022-03-01 DIAGNOSIS — I2510 Atherosclerotic heart disease of native coronary artery without angina pectoris: Secondary | ICD-10-CM | POA: Diagnosis not present

## 2022-03-01 DIAGNOSIS — G894 Chronic pain syndrome: Secondary | ICD-10-CM | POA: Diagnosis not present

## 2022-03-01 DIAGNOSIS — Z9889 Other specified postprocedural states: Secondary | ICD-10-CM | POA: Diagnosis not present

## 2022-03-07 ENCOUNTER — Ambulatory Visit (HOSPITAL_COMMUNITY): Payer: Medicare Other | Attending: Cardiovascular Disease

## 2022-03-07 DIAGNOSIS — I25119 Atherosclerotic heart disease of native coronary artery with unspecified angina pectoris: Secondary | ICD-10-CM | POA: Diagnosis not present

## 2022-03-07 DIAGNOSIS — E782 Mixed hyperlipidemia: Secondary | ICD-10-CM | POA: Diagnosis not present

## 2022-03-07 DIAGNOSIS — I2510 Atherosclerotic heart disease of native coronary artery without angina pectoris: Secondary | ICD-10-CM | POA: Insufficient documentation

## 2022-03-07 DIAGNOSIS — I2584 Coronary atherosclerosis due to calcified coronary lesion: Secondary | ICD-10-CM | POA: Insufficient documentation

## 2022-03-07 DIAGNOSIS — I10 Essential (primary) hypertension: Secondary | ICD-10-CM

## 2022-03-07 LAB — ECHOCARDIOGRAM COMPLETE
Area-P 1/2: 2.45 cm2
P 1/2 time: 701 msec
S' Lateral: 3.3 cm

## 2022-03-08 DIAGNOSIS — Z96612 Presence of left artificial shoulder joint: Secondary | ICD-10-CM | POA: Diagnosis not present

## 2022-03-08 DIAGNOSIS — Z96611 Presence of right artificial shoulder joint: Secondary | ICD-10-CM | POA: Diagnosis not present

## 2022-03-09 ENCOUNTER — Other Ambulatory Visit (HOSPITAL_COMMUNITY): Payer: Self-pay | Admitting: Orthopedic Surgery

## 2022-03-09 DIAGNOSIS — Z96611 Presence of right artificial shoulder joint: Secondary | ICD-10-CM

## 2022-03-13 DIAGNOSIS — M25511 Pain in right shoulder: Secondary | ICD-10-CM | POA: Diagnosis not present

## 2022-03-13 DIAGNOSIS — M25512 Pain in left shoulder: Secondary | ICD-10-CM | POA: Diagnosis not present

## 2022-03-14 DIAGNOSIS — Z96611 Presence of right artificial shoulder joint: Secondary | ICD-10-CM | POA: Diagnosis not present

## 2022-03-16 ENCOUNTER — Encounter (HOSPITAL_COMMUNITY)
Admission: RE | Admit: 2022-03-16 | Discharge: 2022-03-16 | Disposition: A | Discharge status: Home / Self Care | Payer: Medicare Other | Source: Ambulatory Visit | Attending: Orthopedic Surgery | Admitting: Orthopedic Surgery

## 2022-03-16 ENCOUNTER — Ambulatory Visit (HOSPITAL_COMMUNITY)
Admission: RE | Admit: 2022-03-16 | Discharge: 2022-03-16 | Disposition: A | Discharge status: Home / Self Care | Payer: Medicare Other | Source: Ambulatory Visit | Attending: Orthopedic Surgery | Admitting: Orthopedic Surgery

## 2022-03-16 DIAGNOSIS — Z96611 Presence of right artificial shoulder joint: Secondary | ICD-10-CM | POA: Insufficient documentation

## 2022-03-16 DIAGNOSIS — M25512 Pain in left shoulder: Secondary | ICD-10-CM | POA: Diagnosis not present

## 2022-03-16 DIAGNOSIS — M25511 Pain in right shoulder: Secondary | ICD-10-CM | POA: Diagnosis not present

## 2022-03-16 MED ORDER — TECHNETIUM TC 99M MEDRONATE IV KIT
20.0000 | PACK | Freq: Once | INTRAVENOUS | Status: AC | PRN
  Administered 2022-03-16: 20 via INTRAVENOUS

## 2022-03-17 ENCOUNTER — Ambulatory Visit: Payer: Medicare Other | Attending: Cardiovascular Disease

## 2022-03-17 DIAGNOSIS — E782 Mixed hyperlipidemia: Secondary | ICD-10-CM | POA: Diagnosis not present

## 2022-03-17 DIAGNOSIS — I2584 Coronary atherosclerosis due to calcified coronary lesion: Secondary | ICD-10-CM | POA: Diagnosis not present

## 2022-03-17 DIAGNOSIS — Z79899 Other long term (current) drug therapy: Secondary | ICD-10-CM

## 2022-03-17 DIAGNOSIS — I2089 Other forms of angina pectoris: Secondary | ICD-10-CM

## 2022-03-17 DIAGNOSIS — I2510 Atherosclerotic heart disease of native coronary artery without angina pectoris: Secondary | ICD-10-CM | POA: Diagnosis not present

## 2022-03-17 LAB — LIPID PANEL
Chol/HDL Ratio: 5 ratio (ref 0.0–5.0)
Cholesterol, Total: 242 mg/dL — ABNORMAL HIGH (ref 100–199)
HDL: 48 mg/dL (ref >39)
LDL Chol Calc (NIH): 166 mg/dL — ABNORMAL HIGH (ref 0–99)
Triglycerides: 151 mg/dL — ABNORMAL HIGH (ref 0–149)
VLDL Cholesterol Cal: 28 mg/dL (ref 5–40)

## 2022-03-17 LAB — ALT: ALT: 31 IU/L (ref 0–44)

## 2022-03-20 ENCOUNTER — Telehealth: Payer: Self-pay

## 2022-03-20 DIAGNOSIS — I25119 Atherosclerotic heart disease of native coronary artery with unspecified angina pectoris: Secondary | ICD-10-CM

## 2022-03-20 NOTE — Telephone Encounter (Signed)
-----  Message from Thayer Headings, MD sent at 03/20/2022  2:28 PM EST ----- Has not tolerated simva or rosuvastatin  LDL is 166.   He has significant CAD Please refer to lipid clinic for considertion of PCSK9 inhibitor

## 2022-03-20 NOTE — Telephone Encounter (Signed)
Referral has been placed. 

## 2022-03-27 ENCOUNTER — Other Ambulatory Visit (HOSPITAL_COMMUNITY): Payer: Medicare Other

## 2022-03-27 ENCOUNTER — Ambulatory Visit: Payer: Medicare Other | Attending: Cardiology | Admitting: Pharmacist

## 2022-03-27 DIAGNOSIS — I25119 Atherosclerotic heart disease of native coronary artery with unspecified angina pectoris: Secondary | ICD-10-CM | POA: Diagnosis not present

## 2022-03-27 DIAGNOSIS — E782 Mixed hyperlipidemia: Secondary | ICD-10-CM | POA: Diagnosis not present

## 2022-03-27 NOTE — Patient Instructions (Signed)
Your LDL cholesterol is 166 and your goal is < 70  I will submit information to your insurance for Repatha and let you know when I hear back.    Repatha is a subcutaneous injection given once every 2 weeks in the fatty tissue of your stomach or upper outer thigh. Store the medication in the fridge. You can let your dose warm up to room temperature for 30 minutes before injecting if you prefer. Repatha will lower your LDL cholesterol by 60% and helps to lower your chance of having a heart attack or stroke.  We would recheck your cholesterol in 2 months after starting Repatha  Call Jinny Blossom, PharmD if you would like to start this medication [phone]

## 2022-03-27 NOTE — Progress Notes (Unsigned)
Patient ID: Victor [name]                 DOB: [birth year]                    MRN: [id number]     HPI: Victor [name] is a 67 y.o. male patient referred to lipid clinic by Dr Acie Fredrickson. PMH is significant for hyperlipidemia, calcifications of the aorta, elevated coronary artery calcium score of 2535 (98th percentile for age/sex matched controls), fibromyalgia, PVD, preDM, HTN, and OSA. 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  Pt reports diffuse muscle and joint pain all over when he took rosuvastatin. Symptoms resolved when he stopped his rosuvastatin. Also took simvastatin about a decade ago, he was changed to fish oil at that time which he still takes.  Current Medications: OTC fish oil 3g daily Intolerances: simvastatin '40mg'$  daily, rosuvastatin '20mg'$  daily Risk Factors: elevated calcium score, 3v CAD noted on cath LDL goal: '70mg'$ /dL  Diet: Trying to eat more fruits and vegetables  Exercise: Minimal due to pain  Family History: Alzheimer's disease in his mother; Cancer in his mother; Heart Problems in his mother; Hypertension in his father; Interstitial cystitis in his daughter.   Social History: Former tobacco use for 15 years, quit in 2011.   Labs: 03/17/22: TC 242, TG 151, HDL 48, LDL 166 (no LLT)  Past Medical History:  Diagnosis Date   Allergic rhinitis    Anxiety    Arteriosclerosis of thoracic aorta ([place])    Arthritis    "all over" (04/15/2013)   Back pain, chronic    BMI 32.0-32.9,adult    Cervicalgia    Chronic neck pain    COVID-19    DDD (degenerative disc disease), cervical    DDD (degenerative disc disease), lumbar    Diverticulosis    Elevated hemoglobin (HCC)    Fibromyalgia    GERD (gastroesophageal reflux disease)    Gout    Headache    High cholesterol    History of bronchitis    Hyperkalemia    Hypertension    Insomnia    Kidney disease with benign hypertension    Leg  cramps    Right greater than left   Low back pain    Low testosterone    Melanoma (HCC)    Nose   MRSA infection    Neuropathy of both feet    Peripheral vascular disease ([place])    Personal history of colonic polyps    Polycythemia    Pre-diabetes    Radiculopathy of lumbar region    Raynaud phenomenon    Recurrent sinus infections    Right sided sciatica    Sleep apnea    "don't use CPAP" (04/15/2013)   Tobacco dependence     Current Outpatient Medications on File Prior to Visit  Medication Sig Dispense Refill   acetaminophen (TYLENOL) 500 MG tablet Take 500 mg by mouth every 8 (eight) hours as needed for moderate pain.     allopurinol (ZYLOPRIM) 300 MG tablet TAKE 1 AND 1/2 TABLETS BY MOUTH DAILY 135 tablet 0   ALPRAZolam (XANAX) 1 MG tablet Take 1 mg by mouth 2 (two) times daily as needed for anxiety.     amLODipine (NORVASC) 2.5 MG tablet Take 2.5 mg by mouth daily.     aspirin EC 81 MG tablet Take 1 tablet (81 mg total) by  mouth daily. Swallow whole. 90 tablet 3   atenolol (TENORMIN) 100 MG tablet Take 100 mg by mouth every morning.     b complex vitamins tablet Take 1 tablet by mouth in the morning.     Biotin 5000 MCG CAPS Take 5,000 mcg by mouth in the morning.     Calcium Carbonate (CALCIUM 600 PO) Take 600 mg by mouth in the morning.     carboxymethylcellulose (REFRESH PLUS) 0.5 % SOLN Place 1 drop into both eyes 2 (two) times daily as needed (Dry eyes).     colchicine 0.6 MG tablet TAKE 1 TABLET (0.6 MG TOTAL) BY MOUTH 2 (TWO) TIMES DAILY AS NEEDED. 180 tablet 0   cyclobenzaprine (FLEXERIL) 10 MG tablet Take 10 mg by mouth 2 (two) times daily as needed for muscle spasms.     fish oil-omega-3 fatty acids 1000 MG capsule Take 1 g by mouth in the morning, at noon, and at bedtime.     fluticasone (FLONASE) 50 MCG/ACT nasal spray Place 1 spray into both nostrils 2 (two) times daily. For congestion     GINSENG PO Take 400 mg by mouth in the morning.     guaiFENesin (MUCINEX)  600 MG 12 hr tablet Take 600 mg by mouth at bedtime as needed (Congestion).     hydrocortisone cream 1 % Apply 1 application topically daily as needed for itching.     HYDROmorphone (DILAUDID) 4 MG tablet Take 4 mg by mouth every 8 (eight) hours as needed.     ibuprofen (ADVIL) 200 MG tablet Take 200 mg by mouth 2 (two) times daily as needed (pain.).     magnesium gluconate (MAGONATE) 500 MG tablet Take 500 mg by mouth in the morning.     Melatonin 10 MG TABS Take 10 mg by mouth at bedtime as needed (sleep).     Methylsulfonylmethane (MSM) 1000 MG CAPS Take 1,000 mg by mouth in the morning.     Multiple Vitamins-Minerals (MULTIVITAMIN WITH MINERALS) tablet Take 1 tablet by mouth daily.     neomycin-bacitracin-polymyxin (NEOSPORIN) ointment Apply 1 application topically daily as needed for wound care.     nitroGLYCERIN (NITROSTAT) 0.4 MG SL tablet Dissolve 1 tablet under the tongue every 5 minutes as needed for chest pain. Max of 3 doses, then 911. 25 tablet 6   NONFORMULARY OR COMPOUNDED ITEM Apply 1 g topically daily as needed (foot discomfort/pain.). Doxepin 5%, Amantadine 3%, Pentoxifylline 3%, Lamotrigine 2%, Sertraline 3%, Gabapentin 6% PCCA Lipoderm Base Cream     olmesartan (BENICAR) 40 MG tablet Take 40 mg by mouth in the morning.     omeprazole (PRILOSEC) 40 MG capsule Take 40 mg by mouth in the morning.     Psyllium (METAMUCIL FIBER PO) Take 1 Scoop by mouth daily in the afternoon.     RAPAFLO 8 MG CAPS capsule Take 8 mg by mouth at bedtime.  3   selenium 200 MCG TABS tablet Take 200 mcg by mouth in the morning.     sildenafil (VIAGRA) 100 MG tablet Take 100 mg by mouth daily as needed for erectile dysfunction. Per patient taking 1/2 tablet (50 mg) as needed     sodium chloride (OCEAN) 0.65 % SOLN nasal spray Place 1 spray into both nostrils as needed for congestion.     testosterone cypionate (DEPOTESTOTERONE CYPIONATE) 200 MG/ML injection Inject 100 mg into the muscle every 14  (fourteen) days. 0.5 mL     Turmeric 500 MG CAPS Take 500 mg  by mouth in the morning.     venlafaxine (EFFEXOR-XR) 150 MG 24 hr capsule Take 150 mg by mouth daily with breakfast.     Vitamin D3 (VITAMIN D) 25 MCG tablet Take 1,000 Units by mouth in the morning.     vitamin E 400 UNIT capsule Take 400 Units by mouth in the morning.     No current facility-administered medications on file prior to visit.    Allergies  Allergen Reactions   Flexeril [Cyclobenzaprine] Other (See Comments)    Makes patient irritable   Tetracyclines & Related Other (See Comments)    " makes me angry"   Doxycycline Other (See Comments)    Upset stomach   Niacin And Related Itching    Assessment/Plan:  1. Hyperlipidemia - Baseline LDL 166 above goal < 70 due to elevated calcium score and CAD. Pt intolerant to simvastatin and rosuvastatin. Discussed rechallenge with lower statin dose as well as PCSK9i. Pt prefers to try PCSK9i but would like to discuss with his wife first. Repatha PA approved through 09/25/22. Pt to call clinic if he wishes to start on [place]. He is ok with anticipated $45/month copay. Would recheck lipids ~2 months later once starting med.  Juanita Devincent E. Nil Bolser, PharmD, BCACP, [facility]. [address], [place], [place] [address] Phone: [phone]; Fax: [phone] 03/27/2022 2:07 PM

## 2022-03-28 ENCOUNTER — Telehealth: Payer: Self-pay

## 2022-03-28 ENCOUNTER — Ambulatory Visit: Payer: Medicare Other | Admitting: Cardiovascular Disease

## 2022-03-28 ENCOUNTER — Other Ambulatory Visit: Payer: Medicare Other

## 2022-03-28 NOTE — Telephone Encounter (Signed)
Phil, we received a request for cardiac clearance for upcoming shoulder surgery. You recently saw this patient on 02/13/22 at which time he was referred to [facility] for consideration of CABG for multivessel CAD. Would you please comment on cardiac clearance as well as request to hold aspirin for the procedure.  Please direct your response to p cv div preop.  Thank you, Sharyn Lull

## 2022-03-28 NOTE — Telephone Encounter (Signed)
..  Pre-operative Risk Assessment    Patient Name: Victor [name]  DOB: [birth year] MRN: [id number]      Request for Surgical Clearance    Procedure:   conversion left total shoulder to reverse  Date of Surgery:  Clearance TBD                                 Surgeon:  dr Lennette Bihari supple Surgeon's Group or Practice Name:  [facility] Phone number:  [phone] Fax number:  [phone]   Type of Clearance Requested:   - Medical  - Pharmacy:  Hold Aspirin     Type of Anesthesia:  Not Indicated   Additional requests/questions:    Gwenlyn Found   03/28/2022, 1:16 PM

## 2022-03-29 IMAGING — CT CT HEAD W/O CM
4 series · 15 of 47 positions shown, 17 images · non-contrast
Comparison: None

CLINICAL DATA: Headache, dizziness, fell and struck back of head on
05/01/2020. History of hypertension, former smoker

EXAM:
CT HEAD WITHOUT CONTRAST
TECHNIQUE: Contiguous axial images were obtained from the base of the skull
through the vertex without intravenous contrast. Sagittal and
coronal MPR images reconstructed from axial data set.

[Series 2: head 5.00 hr40 s3 axial ibhc · axial · 0.45mm/px · z∈[-630,-505]mm · 7 of 35 slices shown, 9 images]
[im 5/35  brain]
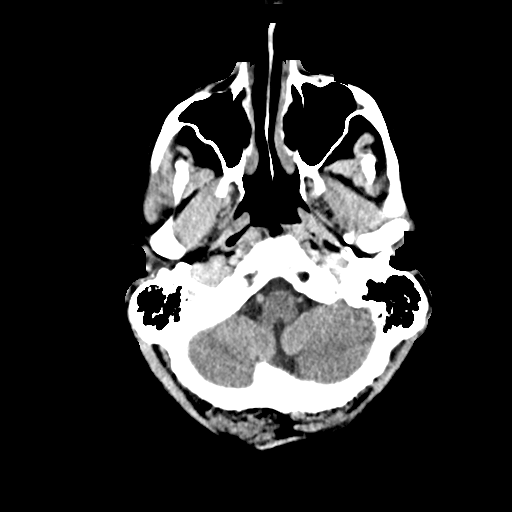
[im 5/35  bone]
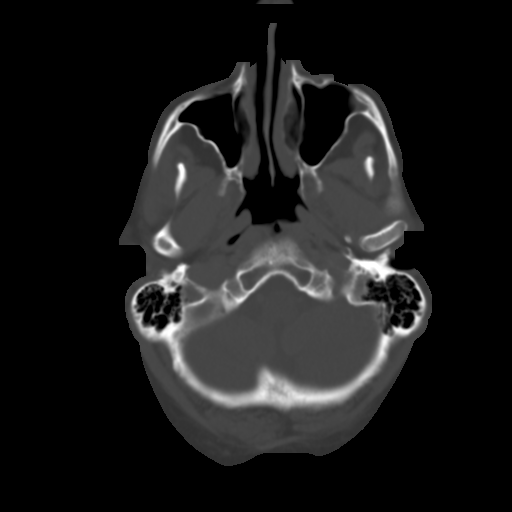
[im 9/35  brain]
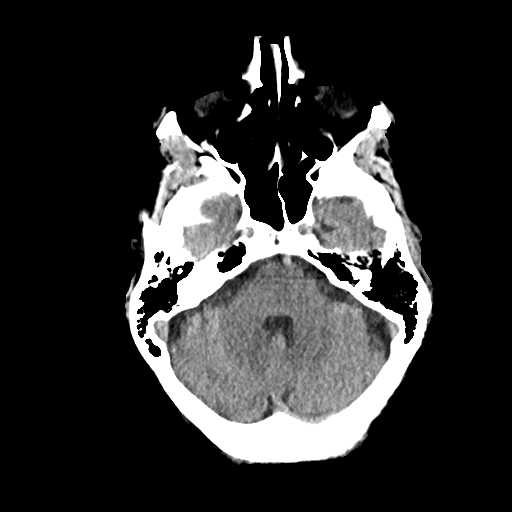
[im 13/35  brain]
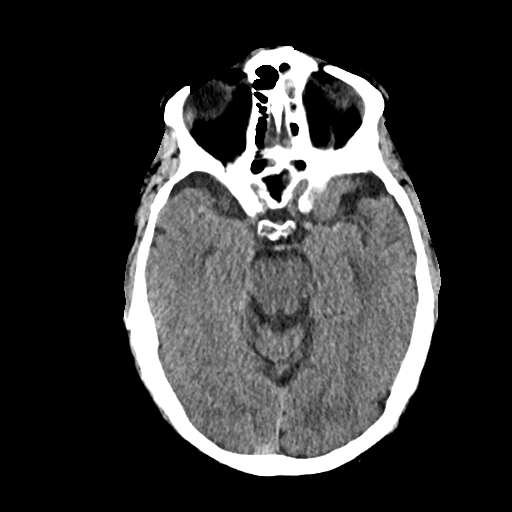
[im 18/35  brain]
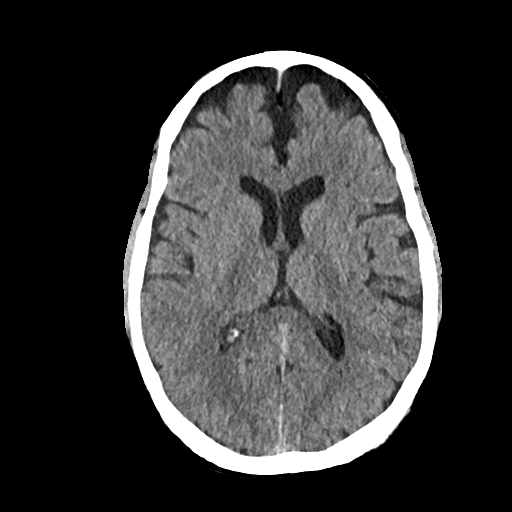
[im 22/35  brain]
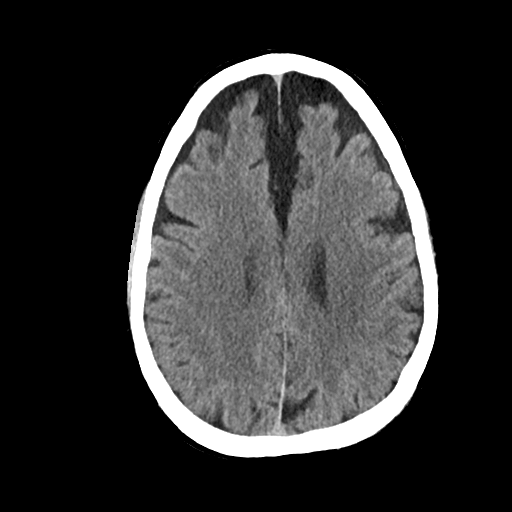
[im 22/35  bone]
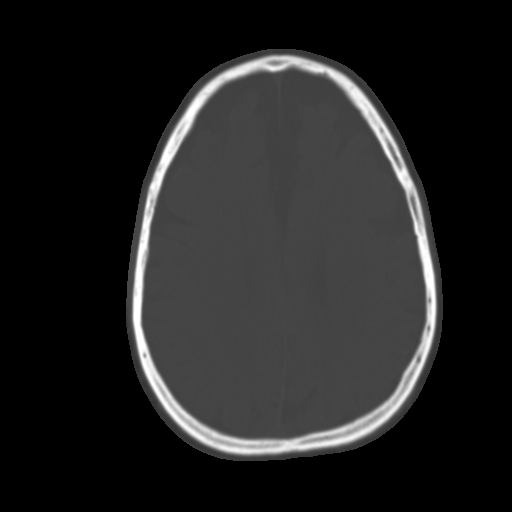
[im 26/35  brain]
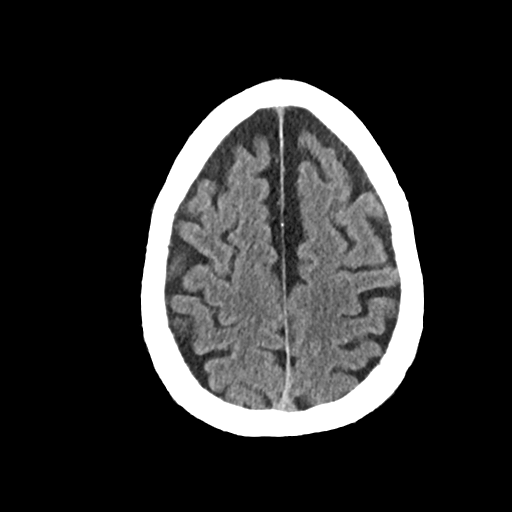
[im 30/35  brain]
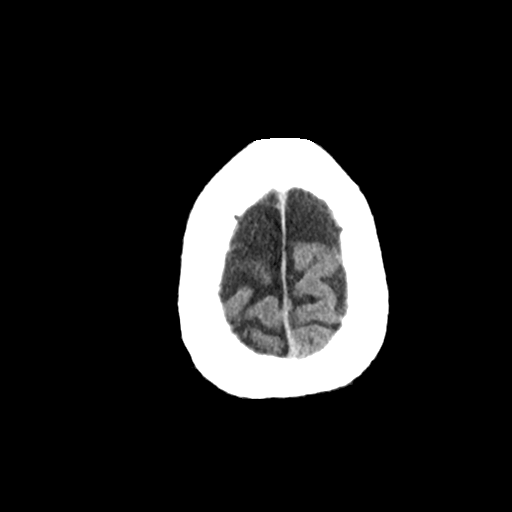

[Series 3: head 2.00 hr60 s3 axial bone · axial · 0.45mm/px · z∈[-635,-617]mm · 2 of 88 slices shown]
[im 9/88  bone]
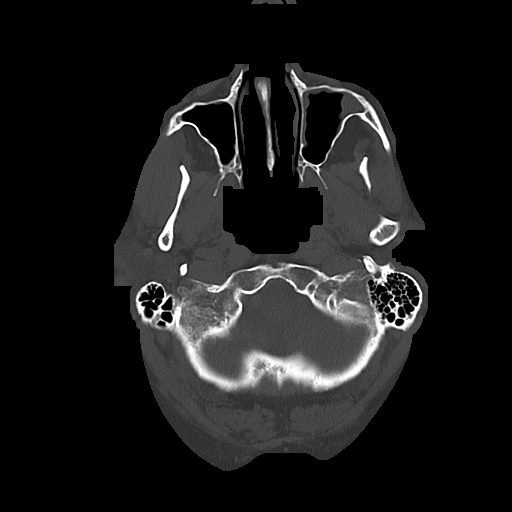
[im 18/88  bone]
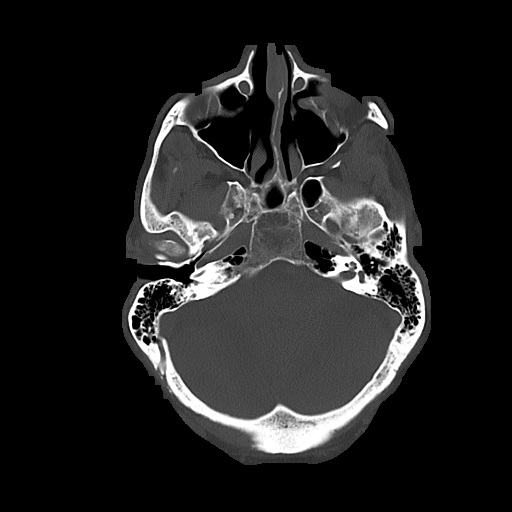

[Series 4: head 3.00 hr40 s3 sag · sagittal · 0.34mm/px · 3 of 77 slices shown]
[im 26/77  brain]
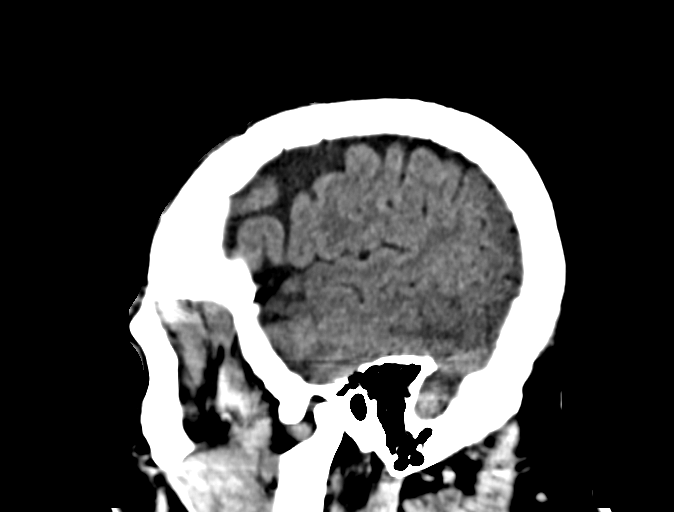
[im 39/77  brain]
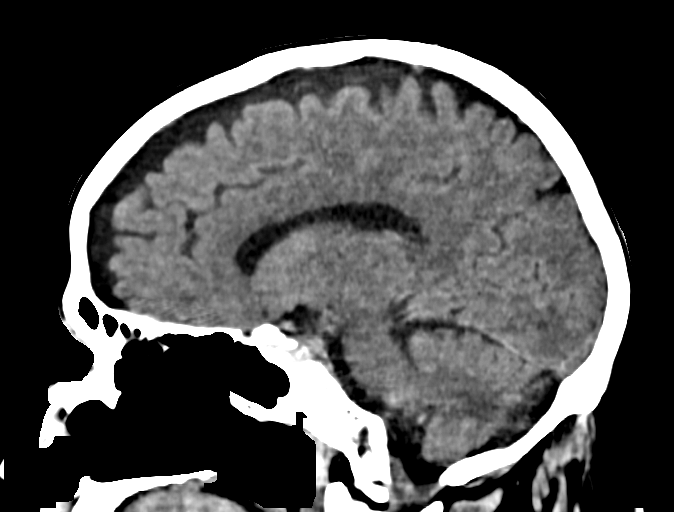
[im 51/77  brain]
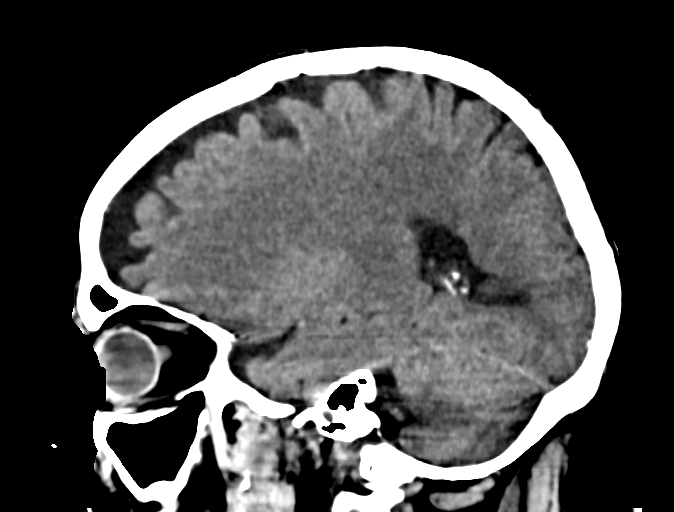

[Series 6: head 3.00 hr40 s3 cor · coronal · 0.34mm/px · 3 of 77 slices shown]
[im 26/77  brain]
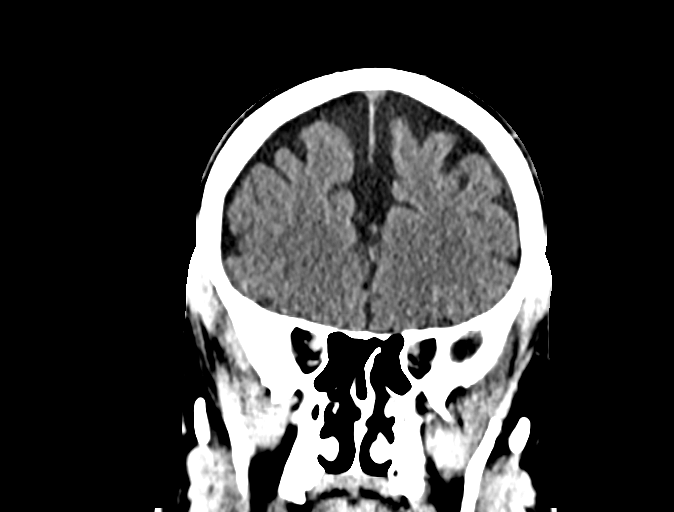
[im 34/77  brain]
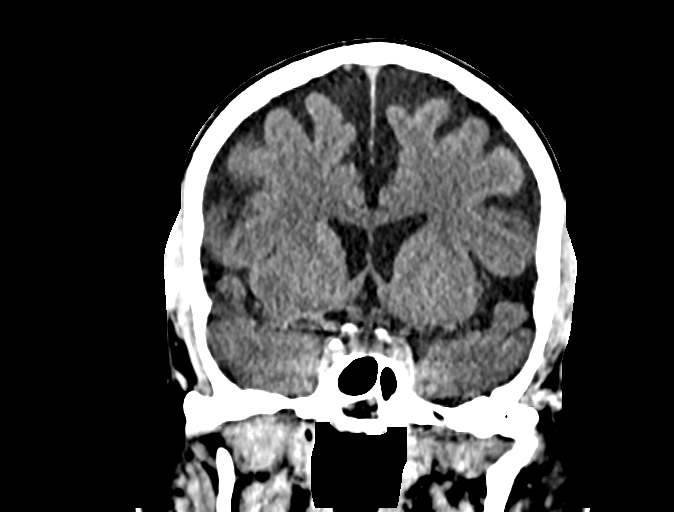
[im 43/77  brain]
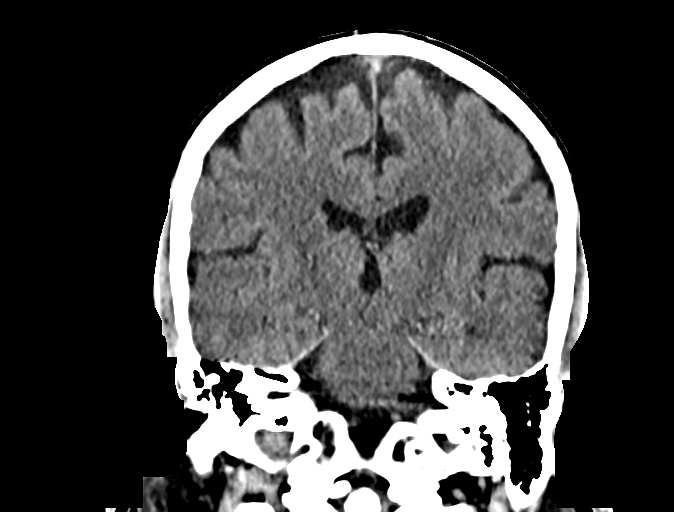

[15 of 47 positions shown; findings below may reference images not displayed]

FINDINGS: Brain: Generalized atrophy. Normal ventricular morphology. No
midline shift or mass effect. Otherwise normal appearance of brain
parenchyma. No intracranial hemorrhage, mass lesion, or evidence of
acute infarction. No extra-axial fluid collections.

Vascular: No hyperdense vessels. Minimal atherosclerotic
calcification of internal carotid arteries at skull base

Skull: Intact

Sinuses/Orbits: Scattered mucosal thickening in ethmoid air cells
and LEFT maxillary sinus.

Other: N/A
IMPRESSION: Generalized atrophy.

No acute intracranial abnormalities.

## 2022-03-29 NOTE — Telephone Encounter (Signed)
Primary Cardiologist:Philip Nahser, MD  Chart reviewed as part of pre-operative protocol coverage. Dr. Acie Fredrickson, primary cardiologist, has advised that Odin undergo evaluation with cardiothoracic surgery on 05/03/2022 for potential CABG and if indicated, that he undergo CABG prior to having shoulder replacement.  I will route this information back to requesting provider and will remove from our preop pool.  Please call with questions.  Emmaline Life, NP-C  03/29/2022, 8:16 AM [address], [address] Office [phone] Fax [phone]

## 2022-03-31 ENCOUNTER — Encounter: Payer: Medicare Other | Admitting: Thoracic Surgery (Cardiothoracic Vascular Surgery)

## 2022-04-02 IMAGING — CR DG CERVICAL SPINE 1V
1 series · 1 of 1 positions shown · non-contrast
Comparison: 08/13/2018.

CLINICAL DATA: Intraoperative localization for C2-3 foraminotomy

EXAM:
DG CERVICAL SPINE - 1 VIEW

[xtable]
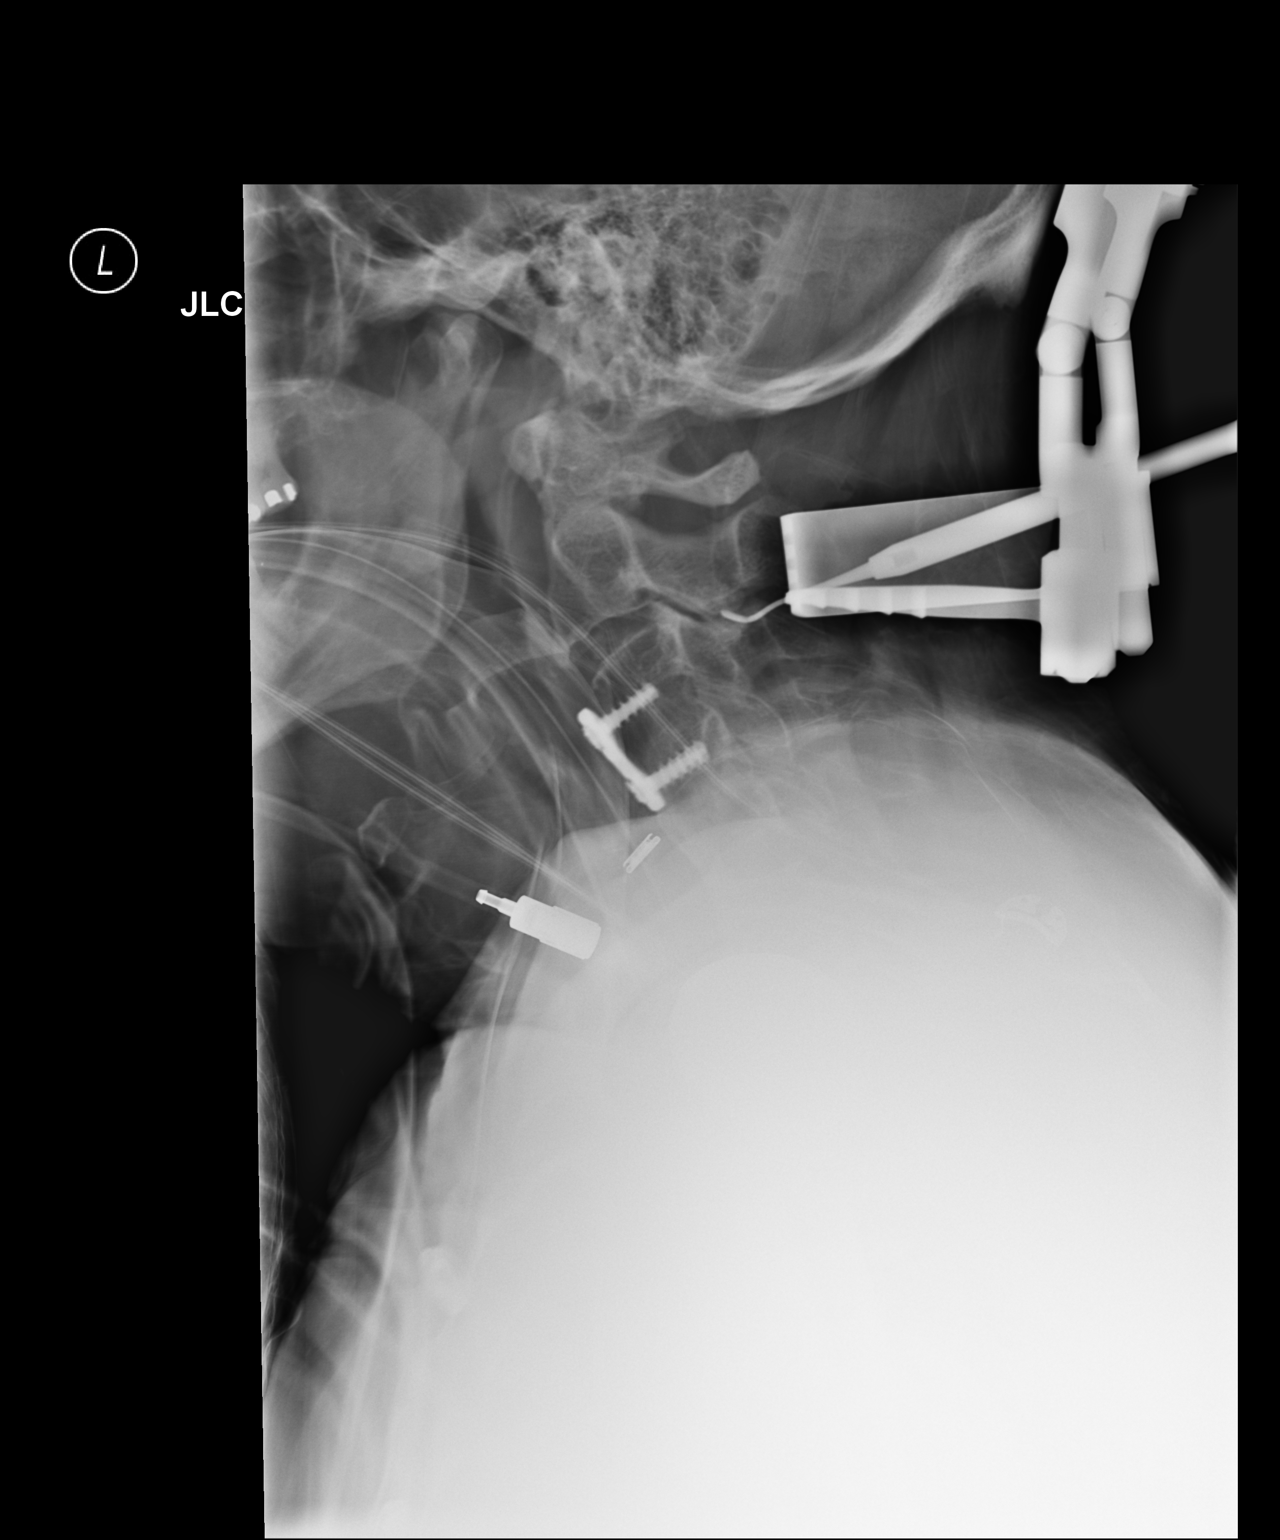

[1 of 1 positions shown; findings below may reference images not displayed]

FINDINGS: Single lateral intraoperative view dated 05/04/2020 and [DATE] a.m.
This images through the mid C5 level. Marker projects posterior to
the C2-3 level. C3-4 anterior fixation.
IMPRESSION: Intraoperative localization of the C2-3 interspace.

## 2022-04-10 ENCOUNTER — Telehealth: Payer: Self-pay | Admitting: Pharmacist

## 2022-04-10 DIAGNOSIS — I25119 Atherosclerotic heart disease of native coronary artery with unspecified angina pectoris: Secondary | ICD-10-CM

## 2022-04-10 MED ORDER — REPATHA SURECLICK 140 MG/ML ~~LOC~~ SOAJ: 1.0000 | SUBCUTANEOUS | 11 refills | Status: DC

## 2022-04-10 NOTE — Telephone Encounter (Signed)
Pt ready to start on Repatha injections. Rx sent to pharmacy and f/u labs scheduled.

## 2022-04-25 DIAGNOSIS — M1712 Unilateral primary osteoarthritis, left knee: Secondary | ICD-10-CM | POA: Diagnosis not present

## 2022-04-25 DIAGNOSIS — M7062 Trochanteric bursitis, left hip: Secondary | ICD-10-CM | POA: Diagnosis not present

## 2022-05-03 ENCOUNTER — Encounter: Payer: Self-pay | Admitting: *Deleted

## 2022-05-03 ENCOUNTER — Other Ambulatory Visit: Payer: Self-pay | Admitting: *Deleted

## 2022-05-03 ENCOUNTER — Encounter: Payer: Self-pay | Admitting: Surgery

## 2022-05-03 ENCOUNTER — Encounter: Payer: Self-pay | Admitting: Cardiovascular Disease

## 2022-05-03 ENCOUNTER — Institutional Professional Consult (permissible substitution): Payer: Medicare Other | Admitting: Surgery

## 2022-05-03 VITALS — BP 113/73 | HR 67 | Resp 20 | Ht 68.0 in | Wt 225.0 lb

## 2022-05-03 DIAGNOSIS — I25119 Atherosclerotic heart disease of native coronary artery with unspecified angina pectoris: Secondary | ICD-10-CM

## 2022-05-05 NOTE — Progress Notes (Signed)
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  He has undergone bilateral shoulder replacements and right knee replacement in the past.  He has chronic left shoulder dislocation and requires surgical revision of his shoulder replacement.  He is in chronic pain and takes Dilaudid for that.  He underwent a cardiac calcium scoring CT on 11/28/2021 showing a coronary calcium score of 2535 which was in the 98th percentile.  The ascending aorta was mildly dilated at 4.0 cm.  He subsequently underwent a cardiac PET scan on 01/24/2022 which was an intermediate risk study consistent with ischemia.  There is a small defect in the mid to basal inferior wall that was reversible suggesting RCA territory ischemia.  Left ventricular ejection fraction was 52%.  He subsequent underwent cardiac catheterization on 02/03/2022 showing occlusion of the proximal RCA.  The distal vessel filled by collaterals from the left.  The LAD had 70% proximal to mid vessel stenosis.  There is a large first diagonal with 90% ostial stenosis.  Left circumflex had 60% proximal stenosis and then 80% mid vessel stenosis.  There were 2 marginal branches.  The first had about 80% stenosis.  Left ventricular ejection fraction was 55 to 65% with normal LVEDP.  The patient is here today with his wife.  He reports some vague chest discomfort that is not necessarily associated with activity.  He does report exertional fatigue and tiredness.  He is not very active due to his orthopedic  problems.  He has had some shortness of breath with activity. Past Medical History:  Diagnosis Date   Allergic rhinitis    Anxiety    Arteriosclerosis of thoracic aorta ([place])    Arthritis    "all over" (04/15/2013)   Back pain, chronic    BMI 32.0-32.9,adult    Cervicalgia    Chronic neck pain    COVID-19    DDD (degenerative disc disease), cervical    DDD (degenerative disc disease), lumbar    Diverticulosis    Elevated hemoglobin (HCC)    Fibromyalgia    GERD (gastroesophageal reflux disease)    Gout    Headache    High cholesterol    History of bronchitis    Hyperkalemia    Hypertension    Insomnia    Kidney disease with benign hypertension    Leg cramps    Right greater than left   Low back pain    Low testosterone    Melanoma (HCC)    Nose   MRSA infection    Neuropathy of both feet    Peripheral vascular disease ([place])    Personal history of colonic polyps    Polycythemia    Pre-diabetes    Radiculopathy of lumbar region    Raynaud phenomenon    Recurrent sinus infections    Right sided sciatica    Sleep apnea    "don't use CPAP" (04/15/2013)   Tobacco dependence     Past Surgical History:  Procedure Laterality Date   ANTERIOR CERVICAL DECOMP/DISCECTOMY FUSION  2000's X3   "had 3  surgeries on my front neck" (04/15/2013)   ANTERIOR LAT LUMBAR FUSION Left 08/06/2017   Procedure: LEFT LUMBAR TWO-THREE ANTERIOR LATERAL LUMBAR FUSION WITH PERCUTANEOUS SCREWS;  Surgeon: Earnie Larsson, MD;  Location: [facility];  Service: Neurosurgery;  Laterality: Left;   APPENDECTOMY  ~ 2010   BACK SURGERY     CARPAL TUNNEL RELEASE Left 1990's   CATARACT EXTRACTION, BILATERAL     COLONOSCOPY     ear drum surgery  1974   EYE SURGERY Right    FLEXIBLE SIGMOIDOSCOPY  12/21/2011   Procedure: FLEXIBLE SIGMOIDOSCOPY;  Surgeon: Lear Ng, MD;  Location: [facility];  Service: Endoscopy;  Laterality: N/A;   HAND TENDON SURGERY Left ~ 1978 X2-1980's X3   "had 5 ORs after  laceration"   HOT HEMOSTASIS  12/21/2011   Procedure: HOT HEMOSTASIS (ARGON PLASMA COAGULATION/BICAP);  Surgeon: Lear Ng, MD;  Location: [facility];  Service: Endoscopy;  Laterality: N/A;   INGUINAL HERNIA REPAIR Left 04/15/2013   Procedure: LAPAROSCOPIC REPAIR OF INCARCERATED LEFT INGUINAL HERNIA;  Surgeon: Gayland Curry, MD;  Location: [place];  Service: General;  Laterality: Left;   INGUINAL HERNIA REPAIR Bilateral 1970's - [place] Left 04/15/2013   INSERTION OF MESH Left 04/15/2013   Procedure: INSERTION OF MESH;  Surgeon: Gayland Curry, MD;  Location: [place];  Service: General;  Laterality: Left;   INTRAVASCULAR PRESSURE WIRE/FFR STUDY N/A 02/03/2022   Procedure: INTRAVASCULAR PRESSURE WIRE/FFR STUDY;  Surgeon: Burnell Blanks, MD;  Location: [facility];  Service: Cardiovascular;  Laterality: N/A;   JOINT REPLACEMENT     right knee   KNEE ARTHROSCOPY Right 1990's   LAPAROSCOPY N/A 04/15/2013   Procedure: LAPAROSCOPY DIAGNOSTIC;  Surgeon: Gayland Curry, MD;  Location: [facility];  Service: General;  Laterality: N/A;   LEFT HEART CATH AND CORONARY ANGIOGRAPHY N/A 02/03/2022   Procedure: LEFT HEART CATH AND CORONARY ANGIOGRAPHY;  Surgeon: Burnell Blanks, MD;  Location: [facility];  Service: Cardiovascular;  Laterality: N/A;   LUMBAR LAMINECTOMY/DECOMPRESSION MICRODISCECTOMY Right 07/22/2015   Procedure: Right Lumbar Two-Three Microdiscectomy;  Surgeon: Earnie Larsson, MD;  Location: [facility];  Service: Neurosurgery;  Laterality: Right;   LUMBAR LAMINECTOMY/DECOMPRESSION MICRODISCECTOMY Right 09/05/2016   Procedure: Microdiscectomy - right - T12-L1;  Surgeon: Earnie Larsson, MD;  Location: [facility];  Service: Neurosurgery;  Laterality: Right;   NASAL SEPTUM SURGERY Bilateral 2014   "removed polyps & infection"   POLYPECTOMY  01/10/2011   Procedure: POLYPECTOMY;  Surgeon: Lear Ng, MD;  [facility];  Service:  Endoscopy;  Laterality: N/A;   POSTERIOR CERVICAL LAMINECTOMY Left 05/24/2020   Procedure: Laminectomy and Foraminotomy - left - Cervical two-Cervical three;  Surgeon: Earnie Larsson, MD;  Location: [facility];  Service: Neurosurgery;  Laterality: Left;   POSTERIOR LUMBAR FUSION  11/1995   "ray cages" (04/15/2013)   SHOULDER ARTHROSCOPY Bilateral    "twice each"   TONSILLECTOMY  1960's   TOTAL KNEE ARTHROPLASTY Right 02/24/2014   Procedure: TOTAL RIGHT KNEE ARTHROPLASTY;  Surgeon: Mauri Pole, MD;  [facility];  Service: Orthopedics;  Laterality: Right;   TOTAL SHOULDER ARTHROPLASTY Left 09/18/2019   Procedure: TOTAL SHOULDER ARTHROPLASTY;  Surgeon: Justice Britain, MD;  Location: [facility];  Service: Orthopedics;  Laterality: Left;  157mn   TOTAL SHOULDER ARTHROPLASTY Right 11/04/2020   Procedure: TOTAL SHOULDER ARTHROPLASTY;  Surgeon: SJustice Britain MD;  Location: [facility];  Service: Orthopedics;  Laterality: Right;  133mn   UPPER GI ENDOSCOPY     VASECTOMY  1988   WISDOM TOOTH EXTRACTION  2000's    Family History  Problem Relation Age of Onset   Hypertension Father    Cancer Mother        breast    Heart Problems Mother    Alzheimer's disease Mother    Allergies Daughter    Interstitial cystitis Daughter     Social History Social History   Tobacco Use   Smoking status: Former    Packs/day: 0.10    Years: 15.00    Total pack years: 1.50    Types: Cigarettes, Cigars    Quit date: 01/08/2010    Years since quitting: 12.3    Passive exposure: Never   Smokeless tobacco: Never   Tobacco comments:    04/15/2013 "smoked ~ 1 pack/month"  Vaping Use   Vaping Use: Never used  Substance Use Topics   Alcohol use: Yes    Alcohol/week: 5.0 standard drinks of alcohol    Types: 1 Glasses of wine, 4 Cans of beer per week    Comment: 04/15/2013 "glass of wine w/dinner 2-3 times/month;  plus 3-4 beers/wk"   Drug use: No    Current Outpatient Medications  Medication Sig Dispense Refill    acetaminophen (TYLENOL) 500 MG tablet Take 500 mg by mouth every 8 (eight) hours as needed for moderate pain.     allopurinol (ZYLOPRIM) 300 MG tablet TAKE 1 AND 1/2 TABLETS BY MOUTH DAILY 135 tablet 0   ALPRAZolam (XANAX) 1 MG tablet Take 1 mg by mouth 2 (two) times daily as needed for anxiety.     aspirin EC 81 MG tablet Take 1 tablet (81 mg total) by mouth daily. Swallow whole. 90 tablet 3   atenolol (TENORMIN) 100 MG tablet Take 100 mg by mouth every morning.     b complex vitamins tablet Take 1 tablet by mouth in the morning.     Biotin 5000 MCG CAPS Take 5,000 mcg by mouth in the morning.     Calcium Carbonate (CALCIUM 600 PO) Take 600 mg by mouth in the morning.     carboxymethylcellulose (REFRESH PLUS) 0.5 % SOLN Place 1 drop into both eyes 2 (two) times daily as needed (Dry eyes).     colchicine 0.6 MG tablet TAKE 1 TABLET (0.6 MG TOTAL) BY MOUTH 2 (TWO) TIMES DAILY AS NEEDED. 180 tablet 0   cyclobenzaprine (FLEXERIL) 10 MG tablet Take 10 mg by mouth 2 (two) times daily as needed for muscle spasms.     Evolocumab (REPATHA SURECLICK) [id number]/ML SOAJ Inject 140 mg into the skin every 14 (fourteen) days. 2 mL 11   fish oil-omega-3 fatty acids 1000 MG capsule Take 1 g by mouth in the morning, at noon, and at bedtime.     fluticasone (FLONASE) 50 MCG/ACT nasal spray Place 1 spray into both nostrils 2 (two) times daily. For congestion     GINSENG PO Take 400 mg by mouth in the morning.     guaiFENesin (MUCINEX) 600 MG 12 hr tablet Take 600 mg by mouth at bedtime as needed (Congestion).     hydrocortisone cream 1 % Apply 1 application topically daily as needed for itching.     HYDROmorphone (DILAUDID) 4 MG tablet Take 4 mg by mouth every 8 (eight) hours as needed.     ibuprofen (ADVIL) 200 MG tablet Take 200 mg by mouth 2 (two) times daily as needed (pain.).  magnesium gluconate (MAGONATE) 500 MG tablet Take 500 mg by mouth in the morning.     Melatonin 10 MG TABS Take 10 mg by mouth at  bedtime as needed (sleep).     Methylsulfonylmethane (MSM) 1000 MG CAPS Take 1,000 mg by mouth in the morning.     Multiple Vitamins-Minerals (MULTIVITAMIN WITH MINERALS) tablet Take 1 tablet by mouth daily.     neomycin-bacitracin-polymyxin (NEOSPORIN) ointment Apply 1 application topically daily as needed for wound care.     nitroGLYCERIN (NITROSTAT) 0.4 MG SL tablet Dissolve 1 tablet under the tongue every 5 minutes as needed for chest pain. Max of 3 doses, then 911. 25 tablet 6   NONFORMULARY OR COMPOUNDED ITEM Apply 1 g topically daily as needed (foot discomfort/pain.). Doxepin 5%, Amantadine 3%, Pentoxifylline 3%, Lamotrigine 2%, Sertraline 3%, Gabapentin 6% PCCA Lipoderm Base Cream     olmesartan (BENICAR) 40 MG tablet Take 40 mg by mouth in the morning.     omeprazole (PRILOSEC) 40 MG capsule Take 40 mg by mouth in the morning.     Psyllium (METAMUCIL FIBER PO) Take 1 Scoop by mouth daily in the afternoon.     RAPAFLO 8 MG CAPS capsule Take 8 mg by mouth at bedtime.  3   selenium 200 MCG TABS tablet Take 200 mcg by mouth in the morning.     sildenafil (VIAGRA) 100 MG tablet Take 100 mg by mouth daily as needed for erectile dysfunction. Per patient taking 1/2 tablet (50 mg) as needed     sodium chloride (OCEAN) 0.65 % SOLN nasal spray Place 1 spray into both nostrils as needed for congestion.     testosterone cypionate (DEPOTESTOTERONE CYPIONATE) 200 MG/ML injection Inject 100 mg into the muscle every 14 (fourteen) days. 0.5 mL     Turmeric 500 MG CAPS Take 500 mg by mouth in the morning.     venlafaxine (EFFEXOR-XR) 150 MG 24 hr capsule Take 150 mg by mouth daily with breakfast.     Vitamin D3 (VITAMIN D) 25 MCG tablet Take 1,000 Units by mouth in the morning.     vitamin E 400 UNIT capsule Take 400 Units by mouth in the morning.     amLODipine (NORVASC) 2.5 MG tablet Take 2.5 mg by mouth daily.     No current facility-administered medications for this visit.    Allergies  Allergen  Reactions   Tetracyclines & Related Other (See Comments)    " makes me angry"   Doxycycline Other (See Comments)    Upset stomach   Niacin And Related Itching    Review of Systems  Constitutional:  Positive for activity change and fatigue.  HENT: Negative.    Eyes: Negative.   Respiratory:  Positive for shortness of breath.   Cardiovascular:  Positive for chest pain. Negative for palpitations and leg swelling.  Gastrointestinal: Negative.   Endocrine: Negative.   Genitourinary: Negative.   Musculoskeletal:  Positive for arthralgias, back pain and myalgias.  Skin: Negative.   Allergic/Immunologic: Negative.   Neurological:  Negative for dizziness and syncope.  Hematological: Negative.   Psychiatric/Behavioral: Negative.      BP 113/73   Pulse 67   Resp 20   Ht '5\' 8"'$  (1.727 m)   Wt 225 lb (102.1 kg)   BMI 34.21 kg/m  Physical Exam Constitutional:      Appearance: Normal appearance. He is obese.  HENT:     Head: Normocephalic and atraumatic.  Eyes:     Extraocular Movements:  Extraocular movements intact.     Conjunctiva/sclera: Conjunctivae normal.     Pupils: Pupils are equal, round, and reactive to light.  Neck:     Vascular: No carotid bruit.  Cardiovascular:     Rate and Rhythm: Normal rate and regular rhythm.     Pulses: Normal pulses.     Heart sounds: Normal heart sounds. No murmur heard. Pulmonary:     Effort: Pulmonary effort is normal.     Breath sounds: Normal breath sounds.  Abdominal:     General: Abdomen is flat. There is no distension.     Palpations: Abdomen is soft.     Tenderness: There is no abdominal tenderness.  Musculoskeletal:        General: No swelling.  Skin:    General: Skin is warm and dry.  Neurological:     General: No focal deficit present.     Mental Status: He is alert and oriented to person, place, and time.  Psychiatric:        Mood and Affect: Mood normal.        Behavior: Behavior normal.      Diagnostic  Tests:  Physicians  Panel Physicians Referring Physician Case Authorizing Physician  Burnell Blanks, MD (Primary)     Procedures  INTRAVASCULAR PRESSURE WIRE/FFR STUDY  LEFT HEART CATH AND CORONARY ANGIOGRAPHY   Conclusion      Prox RCA to Mid RCA lesion is 100% stenosed.   Prox Cx lesion is 60% stenosed.   Prox Cx to Mid Cx lesion is 80% stenosed.   1st Mrg lesion is 80% stenosed.   1st Diag lesion is 90% stenosed.   Prox LAD to Mid LAD lesion is 70% stenosed.   The left ventricular systolic function is normal.   LV end diastolic pressure is normal.   The left ventricular ejection fraction is 55-65% by visual estimate.   Severe triple vessel CAD The LAD has a hazy, 70% proximal stenosis (RFR of 0.90 suggesting borderline flow limitation).  The Large Diagonal branch has severe ostial stenosis The Circumflex is a large caliber vessel with moderate proximal stenosis. The mid vessel has severe stenosis involving two obtuse marginal branches.  The Large dominant RCA has 100% proximal chronic total occlusion. The distal branches fill from left to right collaterals.  Preserved LV systolic function by LV gram.    Recommendations: He has multi-vessel CAD. He has no angina and what appears to be preserved LV systolic function by LV gram. He would benefit from bypass surgery for preservation of LV function. I will discharge him today after bedrest. Will review with Dr. Acie Fredrickson and plan echo then office follow up with Dr. Acie Fredrickson. We will most likely place an outpatient referral to CT surgery for CABG during his visit with Dr. Acie Fredrickson. An alternative option would be medical management of his CAD for now given the fact that he is asymptomatic.      Indications  Coronary artery disease of native artery of native heart with stable angina pectoris ([place]) [id number] (ICD-10-CM)]   Procedural Details  Technical Details Indication: 67 yo male with history of HTN, HLD and tobacco abuse with  recent abnormal coronary CT calcium score leading to abnormal PET stress test. Pt has rare chest pains.   Procedure: The risks, benefits, complications, treatment options, and expected outcomes were discussed with the patient. The patient and/or family concurred with the proposed plan, giving informed consent. The patient was sedated with Versed and Fentanyl. The right wrist  was prepped and draped in a sterile fashion. 1% lidocaine was used for local anesthesia. Using the modified Seldinger access technique, a 6 French Slender sheath was placed in the right radial artery. 3 mg Verapamil was given through the sheath. Weight based IV heparin was given. Standard diagnostic catheters were used to perform selective coronary angiography. A JR4 catheter was used to perform a left ventricular angiogram. All catheter exchanges were performed over an exchange length guidewire.   The moderate to severe proximal LAD lesion was assessed with a flow wire. I engaged the left main with a XB LAD 3.5 guiding catheter. Additional IV heparin was given. I then passed a PressureWireX down the LAD. RFR of 0.90 suggesting the proximal LAD stenosis was moderately severe to severe. (Less than 0.90 would be flow limiting).   The sheath was removed from the right radial artery and a hemostasis band was applied at the arteriotomy site on the right wrist.      Estimated blood loss <50 mL.   During this procedure medications were administered to achieve and maintain moderate conscious sedation while the patient's heart rate, blood pressure, and oxygen saturation were continuously monitored and I was present face-to-face 100% of this time.   Medications (Filter: Administrations occurring from 1424 to 1523 on 02/03/22)  important  Continuous medications are totaled by the amount administered until 02/03/22 1523.   Heparin (Porcine) in NaCl 1000-0.9 UT/500ML-% SOLN (mL)  Total volume: 1,000 mL Date/Time Rate/Dose/Volume Action    02/03/22 1430 500 mL Given   1430 500 mL Given   fentaNYL (SUBLIMAZE) injection (mcg)  Total dose: 50 mcg Date/Time Rate/Dose/Volume Action   02/03/22 1444 50 mcg Given   midazolam (VERSED) injection (mg)  Total dose: 2 mg Date/Time Rate/Dose/Volume Action   02/03/22 1444 2 mg Given   lidocaine (PF) (XYLOCAINE) 1 % injection (mL)  Total volume: 2 mL Date/Time Rate/Dose/Volume Action   02/03/22 1449 2 mL Given   heparin sodium (porcine) injection (Units)  Total dose: 10,000 Units Date/Time Rate/Dose/Volume Action   02/03/22 1452 5,000 Units Given   1500 5,000 Units Given   Radial Cocktail/Verapamil only (mL)  Total volume: 10 mL Date/Time Rate/Dose/Volume Action   02/03/22 1450 10 mL Given   iohexol (OMNIPAQUE) 350 MG/ML injection (mL)  Total volume: 80 mL Date/Time Rate/Dose/Volume Action   02/03/22 1518 80 mL Given    Sedation Time  Sedation Time Physician-1: 29 minutes 46 seconds Contrast  Medication Name Total Dose  iohexol (OMNIPAQUE) 350 MG/ML injection 80 mL   Radiation/Fluoro  Fluoro time: 8.2 (min) DAP: 19.6 (Gycm2) Cumulative Air Kerma: 123456 (mGy) Complications  Complications documented before study signed (02/03/2022  [id number] PM)   No complications were associated with this study.  Documented by Adonis Housekeeper, RT - 02/03/2022  3:21 PM     Coronary Findings  Diagnostic Dominance: Right Left Anterior Descending  Prox LAD to Mid LAD lesion is 70% stenosed.    First Diagonal Branch  1st Diag lesion is 90% stenosed.    Left Circumflex  Vessel is large.  Prox Cx lesion is 60% stenosed.  Prox Cx to Mid Cx lesion is 80% stenosed.    First Obtuse Marginal Branch  1st Mrg lesion is 80% stenosed.    Right Coronary Artery  Vessel is large.  Prox RCA to Mid RCA lesion is 100% stenosed. The lesion is chronically occluded.    Third Right Posterolateral Branch  Collaterals  3rd RPL filled by collaterals from Dist Cx.  Intervention   No  interventions have been documented.   Wall Motion     The following segments are normal: mid anterior, mid inferior, basilar anterior, basilar inferior, apical anterior and apical inferior.           Left Heart  Left Ventricle The left ventricular size is normal. The left ventricular systolic function is normal. LV end diastolic pressure is normal. The left ventricular ejection fraction is 55-65% by visual estimate. No regional wall motion abnormalities.   Coronary Diagrams  Diagnostic Dominance: Right  Intervention   Implants   No implant documentation for this case.   Syngo Images   Show images for CARDIAC CATHETERIZATION Images on Long Term Storage   Show images for Irving, Kalyx Spade "Richardson Landry" Link to Procedure Log  Procedure Log    Hemo Data  Flowsheet Row Most Recent Value  LV Systolic Pressure [id number] mmHg  LV Diastolic Pressure -1 mmHg  LV EDP 3 mmHg  AOp Systolic Pressure [id number] mmHg  AOp Diastolic Pressure 68 mmHg  AOp Mean Pressure [id number] mmHg  LVp Systolic Pressure [id number] mmHg  LVp Diastolic Pressure 2 mmHg  LVp EDP Pressure 12 mmHg    ECHOCARDIOGRAM REPORT       Patient Name:   Victor [name] Date of Exam: 03/07/2022  Medical Rec #:  [id number]             Height:       68.0 in  Accession #:    [id number]            Weight:       227.6 lb  Date of Birth:  1955/10/05             BSA:          2.159 m  Patient Age:    23 years              BP:           114/80 mmHg  Patient Gender: M                     HR:           66 bpm.  Exam Location:  [address]   Procedure: 2D Echo, Cardiac Doppler and Color Doppler   Indications:    I25.10 CAD    History:        Patient has no prior history of Echocardiogram  examinations.                 CAD; Risk Factors:Hypertension, Dyslipidemia and Former  Smoker.    Sonographer:   Coralyn Helling RDCS  Referring Phys: [place]     1. Left ventricular ejection fraction, by  estimation, is 60 to 65%. The  left ventricle has normal function. The left ventricle has no regional  wall motion abnormalities. There is mild left ventricular hypertrophy.  Left ventricular diastolic parameters  are consistent with Grade I diastolic dysfunction (impaired relaxation).   2. Right ventricular systolic function is normal. The right ventricular  size is normal.   3. The mitral valve is normal in structure. No evidence of mitral valve  regurgitation. No evidence of mitral stenosis.   4. The aortic valve is tricuspid. Aortic valve regurgitation is mild. No  aortic stenosis is present. Aortic regurgitation PHT measures 701 msec.   5. Aortic dilatation noted. There is mild dilatation of the aortic root,  measuring 42 mm.  There is mild dilatation of the ascending aorta,  measuring 43 mm.   6. The inferior vena cava is normal in size with greater than 50%  respiratory variability, suggesting right atrial pressure of 3 mmHg.   FINDINGS   Left Ventricle: Left ventricular ejection fraction, by estimation, is 60  to 65%. The left ventricle has normal function. The left ventricle has no  regional wall motion abnormalities. The left ventricular internal cavity  size was normal in size. There is   mild left ventricular hypertrophy. Left ventricular diastolic parameters  are consistent with Grade I diastolic dysfunction (impaired relaxation).   Right Ventricle: The right ventricular size is normal. No increase in  right ventricular wall thickness. Right ventricular systolic function is  normal.   Left Atrium: Left atrial size was normal in size.   Right Atrium: Right atrial size was normal in size.   Pericardium: There is no evidence of pericardial effusion.   Mitral Valve: The mitral valve is normal in structure. No evidence of  mitral valve regurgitation. No evidence of mitral valve stenosis.   Tricuspid Valve: The tricuspid valve is normal in structure. Tricuspid  valve  regurgitation is not demonstrated. No evidence of tricuspid  stenosis.   Aortic Valve: The aortic valve is tricuspid. Aortic valve regurgitation is  mild. Aortic regurgitation PHT measures 701 msec. No aortic stenosis is  present.   Pulmonic Valve: The pulmonic valve was normal in structure. Pulmonic valve  regurgitation is not visualized. No evidence of pulmonic stenosis.   Aorta: Aortic dilatation noted. There is mild dilatation of the aortic  root, measuring 42 mm. There is mild dilatation of the ascending aorta,  measuring 43 mm.   Venous: The inferior vena cava is normal in size with greater than 50%  respiratory variability, suggesting right atrial pressure of 3 mmHg.   IAS/Shunts: No atrial level shunt detected by color flow Doppler.     LEFT VENTRICLE  PLAX 2D  LVIDd:         4.90 cm   Diastology  LVIDs:         3.30 cm   LV e' medial:    6.85 cm/s  LV PW:         1.30 cm   LV E/e' medial:  7.3  LV IVS:        1.30 cm   LV e' lateral:   11.40 cm/s  LVOT diam:     2.20 cm   LV E/e' lateral: 4.4  LV SV:         64  LV SV Index:   30  LVOT Area:     3.80 cm     RIGHT VENTRICLE             IVC  RV S prime:     11.70 cm/s  IVC diam: 1.30 cm  TAPSE (M-mode): 1.4 cm   LEFT ATRIUM             Index        RIGHT ATRIUM           Index  LA diam:        4.30 cm 1.99 cm/m   RA Pressure: 3.00 mmHg  LA Vol (A2C):   47.3 ml 21.91 ml/m  RA Area:     15.50 cm  LA Vol (A4C):   35.1 ml 16.26 ml/m  RA Volume:   40.50 ml  18.76 ml/m  LA Biplane Vol: 41.2 ml 19.08 ml/m  AORTIC VALVE  LVOT Vmax:   79.80 cm/s  LVOT Vmean:  55.400 cm/s  LVOT VTI:    0.169 m  AI PHT:      701 msec    AORTA  Ao Root diam: 4.20 cm  Ao Asc diam:  4.30 cm   MITRAL VALVE               TRICUSPID VALVE  MV Area (PHT): 2.45 cm    Estimated RAP:  3.00 mmHg  MV Decel Time: 310 msec  MV E velocity: 49.80 cm/s  SHUNTS  MV A velocity: 74.40 cm/s  Systemic VTI:  0.17 m  MV E/A ratio:  0.67         Systemic Diam: 2.20 cm   Candee Furbish MD  Electronically signed by Candee Furbish MD  Signature Date/Time: 03/07/2022/4:01:50 PM        Final       Impression:  This 67 year old gentleman has severe multivessel coronary disease with evidence of ischemia in the RCA territory on cardiac PET scan.  He is in need of multiple orthopedic surgeries and I think it would be best to proceed with coronary bypass graft surgery prior to that to minimize his cardiac risk.  I reviewed the catheterization and echo images with him and his wife and answered all their questions. I discussed the operative procedure with the patient and his wife including alternatives, benefits and risks; including but not limited to bleeding, blood transfusion, infection, stroke, myocardial infarction, graft failure, heart block requiring a permanent pacemaker, organ dysfunction, and death.  Narada Frees Dudenhoeffer understands and agrees to proceed.     Plan:  He will be scheduled for coronary artery bypass graft surgery on Friday 05/19/2022.   Gaye Pollack, MD [facility] [phone]

## 2022-05-08 DIAGNOSIS — J329 Chronic sinusitis, unspecified: Secondary | ICD-10-CM | POA: Diagnosis not present

## 2022-05-11 DIAGNOSIS — M5431 Sciatica, right side: Secondary | ICD-10-CM | POA: Diagnosis not present

## 2022-05-11 DIAGNOSIS — I77810 Thoracic aortic ectasia: Secondary | ICD-10-CM | POA: Diagnosis not present

## 2022-05-11 DIAGNOSIS — K219 Gastro-esophageal reflux disease without esophagitis: Secondary | ICD-10-CM | POA: Diagnosis not present

## 2022-05-11 DIAGNOSIS — M797 Fibromyalgia: Secondary | ICD-10-CM | POA: Diagnosis not present

## 2022-05-11 DIAGNOSIS — E782 Mixed hyperlipidemia: Secondary | ICD-10-CM | POA: Diagnosis not present

## 2022-05-11 DIAGNOSIS — M109 Gout, unspecified: Secondary | ICD-10-CM | POA: Diagnosis not present

## 2022-05-11 DIAGNOSIS — I10 Essential (primary) hypertension: Secondary | ICD-10-CM | POA: Diagnosis not present

## 2022-05-11 DIAGNOSIS — M542 Cervicalgia: Secondary | ICD-10-CM | POA: Diagnosis not present

## 2022-05-11 DIAGNOSIS — I25119 Atherosclerotic heart disease of native coronary artery with unspecified angina pectoris: Secondary | ICD-10-CM | POA: Diagnosis not present

## 2022-05-11 DIAGNOSIS — R7303 Prediabetes: Secondary | ICD-10-CM | POA: Diagnosis not present

## 2022-05-15 ENCOUNTER — Ambulatory Visit: Payer: Medicare Other | Attending: Cardiovascular Disease

## 2022-05-15 ENCOUNTER — Ambulatory Visit: Payer: Medicare Other | Admitting: Cardiovascular Disease

## 2022-05-15 DIAGNOSIS — L821 Other seborrheic keratosis: Secondary | ICD-10-CM | POA: Diagnosis not present

## 2022-05-15 DIAGNOSIS — I25119 Atherosclerotic heart disease of native coronary artery with unspecified angina pectoris: Secondary | ICD-10-CM | POA: Diagnosis not present

## 2022-05-15 DIAGNOSIS — D225 Melanocytic nevi of trunk: Secondary | ICD-10-CM | POA: Diagnosis not present

## 2022-05-15 DIAGNOSIS — L814 Other melanin hyperpigmentation: Secondary | ICD-10-CM | POA: Diagnosis not present

## 2022-05-16 LAB — HEPATIC FUNCTION PANEL
ALT: 24 IU/L (ref 0–44)
AST: 28 IU/L (ref 0–40)
Albumin: 4.5 g/dL (ref 3.9–4.9)
Alkaline Phosphatase: 69 IU/L (ref 44–121)
Bilirubin Total: 0.5 mg/dL (ref 0.0–1.2)
Bilirubin, Direct: 0.15 mg/dL (ref 0.00–0.40)
Total Protein: 7.3 g/dL (ref 6.0–8.5)

## 2022-05-16 LAB — LIPID PANEL
Chol/HDL Ratio: 2.9 ratio (ref 0.0–5.0)
Cholesterol, Total: 153 mg/dL (ref 100–199)
HDL: 52 mg/dL (ref >39)
LDL Chol Calc (NIH): 78 mg/dL (ref 0–99)
Triglycerides: 130 mg/dL (ref 0–149)
VLDL Cholesterol Cal: 23 mg/dL (ref 5–40)

## 2022-05-16 NOTE — Pre-Procedure Instructions (Signed)
Surgical Instructions    Your procedure is scheduled on Friday 05/19/22.   Report to [facility] Main Entrance "A" at 05:30 A.M., then check in with the Admitting office.  Call this number if you have problems the morning of surgery:  [phone]   If you have any questions prior to your surgery date call [phone]: Open Monday-Friday 8am-4pm If you experience any cold or flu symptoms such as cough, fever, chills, shortness of breath, etc. between now and your scheduled surgery, please notify us at the above number     Remember:  Do not eat or drink after midnight the night before your surgery    Take these medicines the morning of surgery with A SIP OF WATER:   allopurinol (ZYLOPRIM)   amLODipine (NORVASC)   atenolol (TENORMIN)   loratadine (CLARITIN)   omeprazole (PRILOSEC)   venlafaxine (EFFEXOR-XR)      Take these medicines if needed:   acetaminophen (TYLENOL)   ALPRAZolam (XANAX)   carboxymethylcellulose (REFRESH PLUS)   colchicine   cyclobenzaprine (FLEXERIL)   HYDROmorphone (DILAUDID)   nitroGLYCERIN (NITROSTAT)   sodium chloride (OCEAN) 0.65 % SOLN nasal spray   As of today, STOP taking any Aspirin (unless otherwise instructed by your surgeon) Aleve, Naproxen, Ibuprofen, Motrin, Advil, Goody's, BC's, all herbal medications, fish oil, and all vitamins.           Do not wear jewelry or makeup. Do not wear lotions, powders, perfumes/cologne or deodorant. Do not shave 48 hours prior to surgery.  Men may shave face and neck. Do not bring valuables to the hospital. Do not wear nail polish, gel polish, artificial nails, or any other type of covering on natural nails (fingers and toes) If you have artificial nails or gel coating that need to be removed by a nail salon, please have this removed prior to surgery. Artificial nails or gel coating may interfere with anesthesia's ability to adequately monitor your vital signs.  [facility] is not responsible for any  belongings or valuables.    Do NOT Smoke (Tobacco/Vaping)  24 hours prior to your procedure  If you use a CPAP at night, you may bring your mask for your overnight stay.   Contacts, glasses, hearing aids, dentures or partials may not be worn into surgery, please bring cases for these belongings   For patients admitted to the hospital, discharge time will be determined by your treatment team.   Patients discharged the day of surgery will not be allowed to drive home, and someone needs to stay with them for 24 hours.   SURGICAL WAITING ROOM VISITATION Patients having surgery or a procedure may have no more than 2 support people in the waiting area - these visitors may rotate.   Children under the age of 19 must have an adult with them who is not the patient. If the patient needs to stay at the hospital during part of their recovery, the visitor guidelines for inpatient rooms apply. Pre-op nurse will coordinate an appropriate time for 1 support person to accompany patient in pre-op.  This support person may not rotate.   Please refer to [email or web address] for the visitor guidelines for Inpatients (after your surgery is over and you are in a regular room).    Special instructions:    Oral Hygiene is also important to reduce your risk of infection.  Remember - BRUSH YOUR TEETH THE MORNING OF SURGERY WITH YOUR REGULAR TOOTHPASTE   [facility]- Preparing For Surgery  Before surgery, you  can play an important role. Because skin is not sterile, your skin needs to be as free of germs as possible. You can reduce the number of germs on your skin by washing with CHG (chlorahexidine gluconate) Soap before surgery.  CHG is an antiseptic cleaner which kills germs and bonds with the skin to continue killing germs even after washing.     Please do not use if you have an allergy to CHG or antibacterial soaps. If your skin becomes reddened/irritated stop  using the CHG.  Do not shave (including legs and underarms) for at least 48 hours prior to first CHG shower. It is OK to shave your face.  Please follow these instructions carefully.     Shower the NIGHT BEFORE SURGERY and the MORNING OF SURGERY with CHG Soap.   If you chose to wash your hair, wash your hair first as usual with your normal shampoo. After you shampoo, rinse your hair and body thoroughly to remove the shampoo.  Then [employer] and genitals (private parts) with your normal soap and rinse thoroughly to remove soap.  After that Use CHG Soap as you would any other liquid soap. You can apply CHG directly to the skin and wash gently with a scrungie or a clean washcloth.   Apply the CHG Soap to your body ONLY FROM THE NECK DOWN.  Do not use on open wounds or open sores. Avoid contact with your eyes, ears, mouth and genitals (private parts). Wash Face and genitals (private parts)  with your normal soap.   Wash thoroughly, paying special attention to the area where your surgery will be performed.  Thoroughly rinse your body with warm water from the neck down.  DO NOT shower/wash with your normal soap after using and rinsing off the CHG Soap.  Pat yourself dry with a CLEAN TOWEL.  Wear CLEAN PAJAMAS to bed the night before surgery  Place CLEAN SHEETS on your bed the night before your surgery  DO NOT SLEEP WITH PETS.   Day of Surgery:  Take a shower with CHG soap. Wear Clean/Comfortable clothing the morning of surgery Do not apply any deodorants/lotions.   Remember to brush your teeth WITH YOUR REGULAR TOOTHPASTE.    If you received a COVID test during your pre-op visit, it is requested that you wear a mask when out in public, stay away from anyone that may not be feeling well, and notify your surgeon if you develop symptoms. If you have been in contact with anyone that has tested positive in the last 10 days, please notify your surgeon.    Please read over the following  fact sheets that you were given.

## 2022-05-17 ENCOUNTER — Ambulatory Visit (HOSPITAL_COMMUNITY)
Admission: RE | Admit: 2022-05-17 | Discharge: 2022-05-17 | Disposition: A | Discharge status: Home / Self Care | Payer: Medicare Other | Source: Ambulatory Visit | Attending: Surgery | Admitting: Surgery

## 2022-05-17 ENCOUNTER — Encounter (HOSPITAL_COMMUNITY): Payer: Self-pay

## 2022-05-17 ENCOUNTER — Encounter (HOSPITAL_COMMUNITY)
Admission: RE | Admit: 2022-05-17 | Discharge: 2022-05-17 | Disposition: A | Discharge status: Home / Self Care | Payer: Medicare Other | Source: Ambulatory Visit | Attending: Surgery | Admitting: Surgery

## 2022-05-17 ENCOUNTER — Other Ambulatory Visit: Payer: Self-pay

## 2022-05-17 ENCOUNTER — Ambulatory Visit (HOSPITAL_BASED_OUTPATIENT_CLINIC_OR_DEPARTMENT_OTHER)
Admission: RE | Admit: 2022-05-17 | Discharge: 2022-05-17 | Disposition: A | Discharge status: Home / Self Care | Payer: Medicare Other | Source: Ambulatory Visit | Attending: Surgery | Admitting: Surgery

## 2022-05-17 VITALS — BP 129/82 | HR 63 | Temp 98.0°F | Resp 19 | Ht 68.0 in | Wt 230.0 lb

## 2022-05-17 DIAGNOSIS — Z6832 Body mass index (BMI) 32.0-32.9, adult: Secondary | ICD-10-CM | POA: Insufficient documentation

## 2022-05-17 DIAGNOSIS — I10 Essential (primary) hypertension: Secondary | ICD-10-CM | POA: Insufficient documentation

## 2022-05-17 DIAGNOSIS — E669 Obesity, unspecified: Secondary | ICD-10-CM | POA: Insufficient documentation

## 2022-05-17 DIAGNOSIS — R7303 Prediabetes: Secondary | ICD-10-CM | POA: Insufficient documentation

## 2022-05-17 DIAGNOSIS — Z87891 Personal history of nicotine dependence: Secondary | ICD-10-CM | POA: Insufficient documentation

## 2022-05-17 DIAGNOSIS — Z85820 Personal history of malignant melanoma of skin: Secondary | ICD-10-CM | POA: Insufficient documentation

## 2022-05-17 DIAGNOSIS — G4733 Obstructive sleep apnea (adult) (pediatric): Secondary | ICD-10-CM | POA: Insufficient documentation

## 2022-05-17 DIAGNOSIS — G629 Polyneuropathy, unspecified: Secondary | ICD-10-CM | POA: Insufficient documentation

## 2022-05-17 DIAGNOSIS — G8929 Other chronic pain: Secondary | ICD-10-CM | POA: Insufficient documentation

## 2022-05-17 DIAGNOSIS — Z951 Presence of aortocoronary bypass graft: Secondary | ICD-10-CM | POA: Insufficient documentation

## 2022-05-17 DIAGNOSIS — I7300 Raynaud's syndrome without gangrene: Secondary | ICD-10-CM | POA: Diagnosis not present

## 2022-05-17 DIAGNOSIS — E8770 Fluid overload, unspecified: Secondary | ICD-10-CM | POA: Diagnosis not present

## 2022-05-17 DIAGNOSIS — M1990 Unspecified osteoarthritis, unspecified site: Secondary | ICD-10-CM | POA: Insufficient documentation

## 2022-05-17 DIAGNOSIS — E7800 Pure hypercholesterolemia, unspecified: Secondary | ICD-10-CM | POA: Insufficient documentation

## 2022-05-17 DIAGNOSIS — I25119 Atherosclerotic heart disease of native coronary artery with unspecified angina pectoris: Secondary | ICD-10-CM

## 2022-05-17 DIAGNOSIS — I4891 Unspecified atrial fibrillation: Secondary | ICD-10-CM | POA: Diagnosis not present

## 2022-05-17 DIAGNOSIS — M159 Polyosteoarthritis, unspecified: Secondary | ICD-10-CM | POA: Diagnosis not present

## 2022-05-17 DIAGNOSIS — M109 Gout, unspecified: Secondary | ICD-10-CM | POA: Diagnosis not present

## 2022-05-17 DIAGNOSIS — Z01818 Encounter for other preprocedural examination: Secondary | ICD-10-CM

## 2022-05-17 DIAGNOSIS — M4800 Spinal stenosis, site unspecified: Secondary | ICD-10-CM | POA: Diagnosis not present

## 2022-05-17 DIAGNOSIS — Z1152 Encounter for screening for COVID-19: Secondary | ICD-10-CM | POA: Insufficient documentation

## 2022-05-17 DIAGNOSIS — I700 Atherosclerosis of aorta: Secondary | ICD-10-CM | POA: Diagnosis not present

## 2022-05-17 DIAGNOSIS — M797 Fibromyalgia: Secondary | ICD-10-CM | POA: Diagnosis not present

## 2022-05-17 DIAGNOSIS — K219 Gastro-esophageal reflux disease without esophagitis: Secondary | ICD-10-CM | POA: Insufficient documentation

## 2022-05-17 DIAGNOSIS — I739 Peripheral vascular disease, unspecified: Secondary | ICD-10-CM | POA: Insufficient documentation

## 2022-05-17 DIAGNOSIS — Z981 Arthrodesis status: Secondary | ICD-10-CM | POA: Diagnosis not present

## 2022-05-17 DIAGNOSIS — T84028A Dislocation of other internal joint prosthesis, initial encounter: Secondary | ICD-10-CM | POA: Diagnosis not present

## 2022-05-17 DIAGNOSIS — Z96651 Presence of right artificial knee joint: Secondary | ICD-10-CM | POA: Diagnosis not present

## 2022-05-17 DIAGNOSIS — I129 Hypertensive chronic kidney disease with stage 1 through stage 4 chronic kidney disease, or unspecified chronic kidney disease: Secondary | ICD-10-CM | POA: Diagnosis not present

## 2022-05-17 DIAGNOSIS — D62 Acute posthemorrhagic anemia: Secondary | ICD-10-CM | POA: Diagnosis not present

## 2022-05-17 LAB — TYPE AND SCREEN
ABO/RH(D): A POS
Antibody Screen: NEGATIVE

## 2022-05-17 LAB — PROTIME-INR
INR: 1 (ref 0.8–1.2)
Prothrombin Time: 12.9 seconds (ref 11.4–15.2)

## 2022-05-17 LAB — SURGICAL PCR SCREEN
MRSA, PCR: NEGATIVE
Staphylococcus aureus: NEGATIVE

## 2022-05-17 LAB — BLOOD GAS, ARTERIAL
Acid-base deficit: 0.5 mmol/L (ref 0.0–2.0)
Bicarbonate: 23.1 mmol/L (ref 20.0–28.0)
Drawn by: 58993
O2 Saturation: 98.1 %
Patient temperature: 37
pCO2 arterial: 34 mmHg (ref 32–48)
pH, Arterial: 7.44 (ref 7.35–7.45)
pO2, Arterial: 122 mmHg — ABNORMAL HIGH (ref 83–108)

## 2022-05-17 LAB — CBC
HCT: 42.6 % (ref 39.0–52.0)
Hemoglobin: 14.5 g/dL (ref 13.0–17.0)
MCH: 31.8 pg (ref 26.0–34.0)
MCHC: 34 g/dL (ref 30.0–36.0)
MCV: 93.4 fL (ref 80.0–100.0)
Platelets: 200 10*3/uL (ref 150–400)
RBC: 4.56 MIL/uL (ref 4.22–5.81)
RDW: 14.4 % (ref 11.5–15.5)
WBC: 6.7 10*3/uL (ref 4.0–10.5)
nRBC: 0 % (ref 0.0–0.2)

## 2022-05-17 LAB — VAS US DOPPLER PRE CABG

## 2022-05-17 LAB — URINALYSIS, ROUTINE W REFLEX MICROSCOPIC
Bilirubin Urine: NEGATIVE
Glucose, UA: NEGATIVE mg/dL
Hgb urine dipstick: NEGATIVE
Ketones, ur: NEGATIVE mg/dL
Leukocytes,Ua: NEGATIVE
Nitrite: NEGATIVE
Protein, ur: NEGATIVE mg/dL
Specific Gravity, Urine: 1.018 (ref 1.005–1.030)
pH: 5 (ref 5.0–8.0)

## 2022-05-17 LAB — SARS CORONAVIRUS 2 (TAT 6-24 HRS): SARS Coronavirus 2: NEGATIVE

## 2022-05-17 LAB — COMPREHENSIVE METABOLIC PANEL
ALT: 26 U/L (ref 0–44)
AST: 34 U/L (ref 15–41)
Albumin: 3.6 g/dL (ref 3.5–5.0)
Alkaline Phosphatase: 57 U/L (ref 38–126)
Anion gap: 8 (ref 5–15)
BUN: 17 mg/dL (ref 8–23)
CO2: 20 mmol/L — ABNORMAL LOW (ref 22–32)
Calcium: 8.9 mg/dL (ref 8.9–10.3)
Chloride: 104 mmol/L (ref 98–111)
Creatinine, Ser: 1.06 mg/dL (ref 0.61–1.24)
GFR, Estimated: >60 mL/min (ref >60)
Glucose, Bld: 100 mg/dL — ABNORMAL HIGH (ref 70–99)
Potassium: 4.6 mmol/L (ref 3.5–5.1)
Sodium: 132 mmol/L — ABNORMAL LOW (ref 135–145)
Total Bilirubin: 0.5 mg/dL (ref 0.3–1.2)
Total Protein: 6.7 g/dL (ref 6.5–8.1)

## 2022-05-17 LAB — APTT: aPTT: 29 seconds (ref 24–36)

## 2022-05-17 NOTE — Progress Notes (Signed)
PCP - Antony Contras Cardiologist - [place]  PPM/ICD - Denies Device Orders -  Rep Notified -   Chest x-ray - 05/17/22 EKG - 05/17/22 Stress Test - "I've had two of those" 1997 and 5 years later ECHO - 03/07/22 Cardiac Cath -02/03/22   Sleep Study - Has OSA CPAP - does not use in 20 years.  Uncomfortable cause "lump on head and a sore throat"  DM - Pre diabetic  Blood Thinner Instructions: N/A Aspirin Instructions:Will continue through day before surgery.  ERAS Protcol -No  COVID TEST- No   Anesthesia review Yes cardiac hx:   Patient denies shortness of breath, fever, cough and chest pain at PAT appointment  Patient had sinus infection started about a month ago. Just finished his Levaquin today.  All instructions explained to the patient, with a verbal understanding of the material. Patient agrees to go over the instructions while at home for a better understanding.  The opportunity to ask questions was provided.

## 2022-05-17 NOTE — Progress Notes (Signed)
Pre-CABG vascular studies completed.   Please see CV Procedures for preliminary results.  Jaree Dwight, RVT  11:11 AM 05/17/22

## 2022-05-18 ENCOUNTER — Encounter (HOSPITAL_COMMUNITY): Payer: Self-pay | Admitting: Surgery

## 2022-05-18 LAB — HEMOGLOBIN A1C
Hgb A1c MFr Bld: 6 % — ABNORMAL HIGH (ref 4.8–5.6)
Mean Plasma Glucose: 126 mg/dL

## 2022-05-18 MED ORDER — NITROGLYCERIN IN D5W 200-5 MCG/ML-% IV SOLN
2.0000 ug/min | INTRAVENOUS | Status: DC
  Filled 2022-05-18: qty 250

## 2022-05-18 MED ORDER — MAGNESIUM SULFATE 50 % IJ SOLN
40.0000 meq | INTRAMUSCULAR | Status: DC
  Filled 2022-05-18: qty 9.85

## 2022-05-18 MED ORDER — TRANEXAMIC ACID (OHS) BOLUS VIA INFUSION
15.0000 mg/kg | INTRAVENOUS | Status: AC
  Administered 2022-05-19: 1564.5 mg via INTRAVENOUS
  Filled 2022-05-18: qty 1565

## 2022-05-18 MED ORDER — CEFAZOLIN SODIUM-DEXTROSE 2-4 GM/100ML-% IV SOLN
2.0000 g | INTRAVENOUS | Status: AC
  Administered 2022-05-19: 2 g via INTRAVENOUS
  Filled 2022-05-18: qty 100

## 2022-05-18 MED ORDER — DEXMEDETOMIDINE HCL IN NACL 400 MCG/100ML IV SOLN
0.1000 ug/kg/h | INTRAVENOUS | Status: AC
  Administered 2022-05-19: .3 ug/kg/h via INTRAVENOUS
  Filled 2022-05-18: qty 100

## 2022-05-18 MED ORDER — EPINEPHRINE HCL 5 MG/250ML IV SOLN IN NS
0.0000 ug/min | INTRAVENOUS | Status: DC
  Filled 2022-05-18: qty 250

## 2022-05-18 MED ORDER — PHENYLEPHRINE HCL-NACL 20-0.9 MG/250ML-% IV SOLN
30.0000 ug/min | INTRAVENOUS | Status: AC
  Administered 2022-05-19: 20 ug/min via INTRAVENOUS
  Filled 2022-05-18: qty 250

## 2022-05-18 MED ORDER — POTASSIUM CHLORIDE 2 MEQ/ML IV SOLN
80.0000 meq | INTRAVENOUS | Status: DC
  Filled 2022-05-18: qty 40

## 2022-05-18 MED ORDER — MILRINONE LACTATE IN DEXTROSE 20-5 MG/100ML-% IV SOLN
0.3000 ug/kg/min | INTRAVENOUS | Status: DC
  Filled 2022-05-18: qty 100

## 2022-05-18 MED ORDER — TRANEXAMIC ACID 1000 MG/10ML IV SOLN
1.5000 mg/kg/h | INTRAVENOUS | Status: AC
  Administered 2022-05-19: 1.5 mg/kg/h via INTRAVENOUS
  Filled 2022-05-18: qty 25

## 2022-05-18 MED ORDER — INSULIN REGULAR(HUMAN) IN NACL 100-0.9 UT/100ML-% IV SOLN
INTRAVENOUS | Status: AC
  Administered 2022-05-19: 2.2 [IU]/h via INTRAVENOUS
  Filled 2022-05-18: qty 100

## 2022-05-18 MED ORDER — NOREPINEPHRINE 4 MG/250ML-% IV SOLN
0.0000 ug/min | INTRAVENOUS | Status: DC
  Filled 2022-05-18: qty 250

## 2022-05-18 MED ORDER — HEPARIN 30,000 UNITS/1000 ML (OHS) CELLSAVER SOLUTION
Status: DC
  Filled 2022-05-18: qty 1000

## 2022-05-18 MED ORDER — VANCOMYCIN HCL 1500 MG/300ML IV SOLN
1500.0000 mg | INTRAVENOUS | Status: AC
  Administered 2022-05-19: 1500 mg via INTRAVENOUS
  Filled 2022-05-18: qty 300

## 2022-05-18 MED ORDER — TRANEXAMIC ACID (OHS) PUMP PRIME SOLUTION
2.0000 mg/kg | INTRAVENOUS | Status: DC
  Filled 2022-05-18: qty 2.09

## 2022-05-18 MED ORDER — PLASMA-LYTE A IV SOLN
INTRAVENOUS | Status: DC
  Filled 2022-05-18: qty 2.5

## 2022-05-18 NOTE — Anesthesia Preprocedure Evaluation (Addendum)
Anesthesia Evaluation  Patient identified by MRN, date of birth, ID band Patient awake    Reviewed: Allergy & Precautions, NPO status , Patient's Chart, lab work & pertinent test results, reviewed documented beta blocker date and time   History of Anesthesia Complications Negative for: history of anesthetic complications  Airway Mallampati: II  TM Distance: >3 FB Neck ROM: Full    Dental  (+) Caps, Dental Advisory Given   Pulmonary sleep apnea (does not use CPAP) , former smoker   breath sounds clear to auscultation       Cardiovascular hypertension, Pt. on medications and Pt. on home beta blockers + angina  + CAD (severe 3v ASCAD) and + Peripheral Vascular Disease   Rhythm:Regular Rate:Normal  02/2022 ECHO: EF 60-65%, normal LVF, mild LVH, Grade 1 DD, normal RVF, mild AI   Neuro/Psych  Headaches  Anxiety     Chronic back pain    GI/Hepatic Neg liver ROS,GERD  Medicated and Controlled,,  Endo/Other   BMI 35  Renal/GU Renal InsufficiencyRenal disease     Musculoskeletal  (+) Arthritis ,  Fibromyalgia -  Abdominal   Peds  Hematology negative hematology ROS (+)   Anesthesia Other Findings   Reproductive/Obstetrics                              Anesthesia Physical Anesthesia Plan  ASA: 4  Anesthesia Plan: General   Post-op Pain Management:    Induction: Intravenous  PONV Risk Score and Plan: 2 and Treatment may vary due to age or medical condition  Airway Management Planned: Oral ETT  Additional Equipment: Ultrasound Guidance Line Placement, TEE, PA Cath and Arterial line  Intra-op Plan:   Post-operative Plan: Post-operative intubation/ventilation  Informed Consent: I have reviewed the patients History and Physical, chart, labs and discussed the procedure including the risks, benefits and alternatives for the proposed anesthesia with the patient or authorized representative who  has indicated his/her understanding and acceptance.     Dental advisory given  Plan Discussed with:   Anesthesia Plan Comments: (PAT note written 05/18/2022 by Myra Gianotti, PA-C.  )        Anesthesia Quick Evaluation

## 2022-05-18 NOTE — Progress Notes (Signed)
Anesthesia Chart Review:  Case: [id number] Date/Time: 05/19/22 0715   Procedures:      CORONARY ARTERY BYPASS GRAFTING (CABG) (Chest)     TRANSESOPHAGEAL ECHOCARDIOGRAM   Anesthesia type: General   Pre-op diagnosis: CAD   Location: [facility] OR ROOM 15 / [place] [facility]   Surgeons: Gaye Pollack, MD       DISCUSSION: Patient is a 67 year old male scheduled for the above procedure.   History includes former smoker (quit 01/08/10), HTN, hypercholesterolemia, CAD, OSA (does not use CPAP), prediabetes, GERD, gout, neuropathy, melanoma (nose), PVD, polycythemia, chronic back/neck pain, back surgery (right L2-3 lami/microdiscectomy 07/22/15; right T12-L1 microdiscectomy 09/05/16; L2-3 anterolateral fusion 08/06/17; L4-S1 PLIF 05/31/21), neck surgery (ACDF 4-6; C3-4 ACDF with removal of anterior plat C4-6 12/11/01), osteoarthritis (right TKA 02/24/14; left TSA 09/18/19, right TSA 11/04/20). BMI is consistent with obesity.  05/17/2022 presurgical COVID-19 test was negative.  05/17/2022 preoperative CXR is still in process.  Anesthesia team to evaluate on the day of surgery.   VS: BP 129/82   Pulse 63   Temp 36.7 C   Resp 19   Ht 5\' 8"  (1.727 m)   Wt 104.3 kg   SpO2 99%   BMI 34.97 kg/m    PROVIDERS: Antony Contras, MD  is PCP  Mertie Moores, MD is cardiologist Bo Merino, MD is rheumatologist   LABS: Labs reviewed: Acceptable for surgery. (all labs ordered are listed, but only abnormal results are displayed)  Labs Reviewed  COMPREHENSIVE METABOLIC PANEL - Abnormal; Notable for the following components:      Result Value   Sodium 132 (*)    CO2 20 (*)    Glucose, Bld 100 (*)    All other components within normal limits  BLOOD GAS, ARTERIAL - Abnormal; Notable for the following components:   pO2, Arterial 122 (*)    All other components within normal limits  HEMOGLOBIN A1C - Abnormal; Notable for the following components:   Hgb A1c MFr Bld 6.0 (*)    All other components within normal limits   SURGICAL PCR SCREEN  SARS CORONAVIRUS 2 (TAT 6-24 HRS)  CBC  PROTIME-INR  APTT  URINALYSIS, ROUTINE W REFLEX MICROSCOPIC  TYPE AND SCREEN    IMAGES: CXR 05/17/22: In process.   EKG: 05/17/22: Sinus rhythm with 1st degree A-V block Otherwise normal ECG When compared with ECG of 03-Feb-2022 15:29, PREVIOUS ECG IS PRESENT No significant change since last tracing Confirmed by Lyman Bishop [phone]) on 05/17/2022 9:08:45 PM   CV: US Carotid 05/17/22: Summary:  - Right Carotid: Velocities in the right ICA are consistent with a 1-39%  stenosis.  - Left Carotid: Velocities in the left ICA are consistent with a 1-39%  stenosis.  - Vertebrals: Bilateral vertebral arteries demonstrate antegrade flow.  - Subclavians: Normal flow hemodynamics were seen in bilateral subclavian               arteries.    Echo 03/07/22: IMPRESSIONS   1. Left ventricular ejection fraction, by estimation, is 60 to 65%. The  left ventricle has normal function. The left ventricle has no regional  wall motion abnormalities. There is mild left ventricular hypertrophy.  Left ventricular diastolic parameters  are consistent with Grade I diastolic dysfunction (impaired relaxation).   2. Right ventricular systolic function is normal. The right ventricular  size is normal.   3. The mitral valve is normal in structure. No evidence of mitral valve  regurgitation. No evidence of mitral stenosis.   4. The aortic  valve is tricuspid. Aortic valve regurgitation is mild. No  aortic stenosis is present. Aortic regurgitation PHT measures 701 msec.   5. Aortic dilatation noted. There is mild dilatation of the aortic root,  measuring 42 mm. There is mild dilatation of the ascending aorta,  measuring 43 mm.   6. The inferior vena cava is normal in size with greater than 50%  respiratory variability, suggesting right atrial pressure of 3 mmHg.    Cardiac cath 02/03/22:   Prox RCA to Mid RCA lesion is 100% stenosed.   Prox  Cx lesion is 60% stenosed.   Prox Cx to Mid Cx lesion is 80% stenosed.   1st Mrg lesion is 80% stenosed.   1st Diag lesion is 90% stenosed.   Prox LAD to Mid LAD lesion is 70% stenosed.   The left ventricular systolic function is normal.   LV end diastolic pressure is normal.   The left ventricular ejection fraction is 55-65% by visual estimate.   Severe triple vessel CAD The LAD has a hazy, 70% proximal stenosis (RFR of 0.90 suggesting borderline flow limitation).  The Large Diagonal branch has severe ostial stenosis The Circumflex is a large caliber vessel with moderate proximal stenosis. The mid vessel has severe stenosis involving two obtuse marginal branches.  The Large dominant RCA has 100% proximal chronic total occlusion. The distal branches fill from left to right collaterals.  Preserved LV systolic function by LV gram.    Recommendations: He has multi-vessel CAD. He has no angina and what appears to be preserved LV systolic function by LV gram. He would benefit from bypass surgery for preservation of LV function...   [place] Abd Aorta 11/17/21: IMPRESSION: Negative for abdominal aortic aneurysm.   Past Medical History:  Diagnosis Date   Allergic rhinitis    Anxiety    Arteriosclerosis of thoracic aorta ([place])    Arthritis    "all over" (04/15/2013)   Back pain, chronic    BMI 32.0-32.9,adult    Cervicalgia    Chronic neck pain    Coronary artery disease 2023   COVID-19    DDD (degenerative disc disease), cervical    DDD (degenerative disc disease), lumbar    Diverticulosis    Elevated hemoglobin (HCC)    Fibromyalgia    GERD (gastroesophageal reflux disease)    Gout    Headache    High cholesterol    History of bronchitis    Hyperkalemia    Hypertension    Insomnia    Kidney disease with benign hypertension    Leg cramps    Right greater than left   Low back pain    Low testosterone    Melanoma (HCC)    Nose   MRSA infection    Neuropathy of both feet     Peripheral vascular disease (HCC)    Personal history of colonic polyps    Polycythemia    Pre-diabetes    Radiculopathy of lumbar region    Raynaud phenomenon    Recurrent sinus infections    Right sided sciatica    Sleep apnea    "don't use CPAP" (04/15/2013)   Tobacco dependence     Past Surgical History:  Procedure Laterality Date   ANTERIOR CERVICAL DECOMP/DISCECTOMY FUSION  2000's X3   "had 3 surgeries on my front neck" (04/15/2013)   ANTERIOR LAT LUMBAR FUSION Left 08/06/2017   Procedure: LEFT LUMBAR TWO-THREE ANTERIOR LATERAL LUMBAR FUSION WITH PERCUTANEOUS SCREWS;  Surgeon: Earnie Larsson, MD;  Location: [facility]  OR;  Service: Neurosurgery;  Laterality: Left;   APPENDECTOMY  ~ 2010   BACK SURGERY  05/31/2021   Lumbar fusion L4-L5 L5-S1 with Dr. Georgianne Fick TUNNEL RELEASE Left 1990's   CATARACT EXTRACTION, BILATERAL     COLONOSCOPY     ear drum surgery  1974   EYE SURGERY Right    FLEXIBLE SIGMOIDOSCOPY  12/21/2011   Procedure: FLEXIBLE SIGMOIDOSCOPY;  Surgeon: Lear Ng, MD;  Location: [facility] END[facility];  Service: Endoscopy;  Laterality: N/A;   HAND TENDON SURGERY Left ~ 1978 X2-1980's X3   "had 5 ORs after laceration"   HOT HEMOSTASIS  12/21/2011   Procedure: HOT HEMOSTASIS (ARGON PLASMA COAGULATION/BICAP);  Surgeon: Lear Ng, MD;  Location: [facility];  Service: Endoscopy;  Laterality: N/A;   INGUINAL HERNIA REPAIR Left 04/15/2013   Procedure: LAPAROSCOPIC REPAIR OF INCARCERATED LEFT INGUINAL HERNIA;  Surgeon: Gayland Curry, MD;  Location: [place];  Service: General;  Laterality: Left;   INGUINAL HERNIA REPAIR Bilateral 1970's - [place] Left 04/15/2013   INSERTION OF MESH Left 04/15/2013   Procedure: INSERTION OF MESH;  Surgeon: Gayland Curry, MD;  Location: [place];  Service: General;  Laterality: Left;   INTRAVASCULAR PRESSURE WIRE/FFR STUDY N/A 02/03/2022   Procedure: INTRAVASCULAR PRESSURE WIRE/FFR STUDY;  Surgeon: Burnell Blanks, MD;  Location: [place] [facility];  Service: Cardiovascular;  Laterality: N/A;   JOINT REPLACEMENT     right knee   KNEE ARTHROSCOPY Right 1990's   LAPAROSCOPY N/A 04/15/2013   Procedure: LAPAROSCOPY DIAGNOSTIC;  Surgeon: Gayland Curry, MD;  Location: [place];  Service: General;  Laterality: N/A;   LEFT HEART CATH AND CORONARY ANGIOGRAPHY N/A 02/03/2022   Procedure: LEFT HEART CATH AND CORONARY ANGIOGRAPHY;  Surgeon: Burnell Blanks, MD;  Location: [place] [facility];  Service: Cardiovascular;  Laterality: N/A;   LUMBAR LAMINECTOMY/DECOMPRESSION MICRODISCECTOMY Right 07/22/2015   Procedure: Right Lumbar Two-Three Microdiscectomy;  Surgeon: Earnie Larsson, MD;  Location: [place] [facility];  Service: Neurosurgery;  Laterality: Right;   LUMBAR LAMINECTOMY/DECOMPRESSION MICRODISCECTOMY Right 09/05/2016   Procedure: Microdiscectomy - right - T12-L1;  Surgeon: Earnie Larsson, MD;  Location: [place];  Service: Neurosurgery;  Laterality: Right;   NASAL SEPTUM SURGERY Bilateral 2014   "removed polyps & infection"   POLYPECTOMY  01/10/2011   Procedure: POLYPECTOMY;  Surgeon: Lear Ng, MD;  Location: [facility];  Service: Endoscopy;  Laterality: N/A;   POSTERIOR CERVICAL LAMINECTOMY Left 05/24/2020   Procedure: Laminectomy and Foraminotomy - left - Cervical two-Cervical three;  Surgeon: Earnie Larsson, MD;  Location: [place];  Service: Neurosurgery;  Laterality: Left;   POSTERIOR LUMBAR FUSION  11/1995   "ray cages" (04/15/2013)   SHOULDER ARTHROSCOPY Bilateral    "twice each"   TONSILLECTOMY  1960's   TOTAL KNEE ARTHROPLASTY Right 02/24/2014   Procedure: TOTAL RIGHT KNEE ARTHROPLASTY;  Surgeon: Mauri Pole, MD;  Location: [facility];  Service: Orthopedics;  Laterality: Right;   TOTAL SHOULDER ARTHROPLASTY Left 09/18/2019   Procedure: TOTAL SHOULDER ARTHROPLASTY;  Surgeon: Justice Britain, MD;  Location: [facility];  Service: Orthopedics;  Laterality: Left;  174min   TOTAL SHOULDER  ARTHROPLASTY Right 11/04/2020   Procedure: TOTAL SHOULDER ARTHROPLASTY;  Surgeon: Justice Britain, MD;  Location: [facility];  Service: Orthopedics;  Laterality: Right;  173min   UPPER GI ENDOSCOPY     VASECTOMY  1988   WISDOM TOOTH EXTRACTION  2000's    MEDICATIONS:  acetaminophen (  TYLENOL) 500 MG tablet   allopurinol (ZYLOPRIM) 300 MG tablet   ALPRAZolam (XANAX) 1 MG tablet   amLODipine (NORVASC) 5 MG tablet   aspirin EC 81 MG tablet   atenolol (TENORMIN) 100 MG tablet   b complex vitamins tablet   Biotin 5000 MCG CAPS   Calcium Carbonate (CALCIUM 600 PO)   carboxymethylcellulose (REFRESH PLUS) 0.5 % SOLN   colchicine 0.6 MG tablet   cyclobenzaprine (FLEXERIL) 10 MG tablet   Evolocumab (REPATHA SURECLICK) [id number] MG/ML SOAJ   fish oil-omega-3 fatty acids 1000 MG capsule   fluticasone (FLONASE) 50 MCG/ACT nasal spray   GINSENG PO   guaiFENesin (MUCINEX) 600 MG 12 hr tablet   hydrocortisone cream 1 %   HYDROmorphone (DILAUDID) 4 MG tablet   ibuprofen (ADVIL) 200 MG tablet   loratadine (CLARITIN) 10 MG tablet   magnesium gluconate (MAGONATE) 500 MG tablet   Melatonin 10 MG TABS   Methylsulfonylmethane (MSM) 1000 MG CAPS   Multiple Vitamins-Minerals (MULTIVITAMIN WITH MINERALS) tablet   neomycin-bacitracin-polymyxin (NEOSPORIN) ointment   nitroGLYCERIN (NITROSTAT) 0.4 MG SL tablet   NONFORMULARY OR COMPOUNDED ITEM   olmesartan (BENICAR) 40 MG tablet   omeprazole (PRILOSEC) 40 MG capsule   Psyllium (METAMUCIL FIBER PO)   selenium 200 MCG TABS tablet   sildenafil (VIAGRA) 100 MG tablet   silodosin (RAPAFLO) 8 MG CAPS capsule   sodium chloride (OCEAN) 0.65 % SOLN nasal spray   testosterone cypionate (DEPOTESTOTERONE CYPIONATE) 200 MG/ML injection   Turmeric 500 MG CAPS   venlafaxine (EFFEXOR-XR) 150 MG 24 hr capsule   Vitamin D3 (VITAMIN D) 25 MCG tablet   vitamin E 400 UNIT capsule   No current facility-administered medications for this encounter.    [START ON 05/19/2022]  ceFAZolin (ANCEF) IVPB 2g/100 mL premix   [START ON 05/19/2022] ceFAZolin (ANCEF) IVPB 2g/100 mL premix   [START ON 05/19/2022] dexmedetomidine (PRECEDEX) 400 MCG/100ML (4 mcg/mL) infusion   [START ON 05/19/2022] EPINEPHrine (ADRENALIN) 5 mg in NS 250 mL (0.02 mg/mL) premix infusion   [START ON 05/19/2022] heparin 30,000 units/NS 1000 mL solution for CELLSAVER   [START ON 05/19/2022] heparin sodium (porcine) 2,500 Units, papaverine 30 mg in electrolyte-A (PLASMALYTE-A PH 7.4) 500 mL irrigation   [START ON 05/19/2022] insulin regular, human (MYXREDLIN) 100 units/ 100 mL infusion   [START ON 05/19/2022] magnesium sulfate (IV Push/IM) injection 40 mEq   [START ON 05/19/2022] milrinone (PRIMACOR) 20 MG/100 ML (0.2 mg/mL) infusion   [START ON 05/19/2022] nitroGLYCERIN 50 mg in dextrose 5 % 250 mL (0.2 mg/mL) infusion   [START ON 05/19/2022] norepinephrine (LEVOPHED) 4mg  in 265mL (0.016 mg/mL) premix infusion   [START ON 05/19/2022] phenylephrine (NEO-SYNEPHRINE) 20mg /NS 220mL premix infusion   [START ON 05/19/2022] potassium chloride injection 80 mEq   [START ON 05/19/2022] tranexamic acid (CYKLOKAPRON) 2,500 mg in sodium chloride 0.9 % 250 mL (10 mg/mL) infusion   [START ON 05/19/2022] tranexamic acid (CYKLOKAPRON) bolus via infusion - over 30 minutes 1,564.5 mg   [START ON 05/19/2022] tranexamic acid (CYKLOKAPRON) pump prime solution 209 mg   [START ON 05/19/2022] vancomycin (VANCOREADY) IVPB 1500 mg/300 mL    Myra Gianotti, PA-C Surgical Short Stay/Anesthesiology [facility] Phone [phone] [facility] Phone [phone] 05/18/2022 10:25 AM

## 2022-05-18 NOTE — H&P (Signed)
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  He has undergone bilateral shoulder replacements and right knee replacement in the past.  He has chronic left shoulder dislocation and requires surgical revision of his shoulder replacement.  He is in chronic pain and takes Dilaudid for that.  He underwent a cardiac calcium scoring CT on 11/28/2021 showing a coronary calcium score of 2535 which was in the 98th percentile.  The ascending aorta was mildly dilated at 4.0 cm.  He subsequently underwent a cardiac PET scan on 01/24/2022 which was an intermediate risk study consistent with ischemia.  There is a small defect in the mid to basal inferior wall that was reversible suggesting RCA territory ischemia.  Left ventricular ejection fraction was 52%.  He subsequent underwent cardiac catheterization on 02/03/2022 showing occlusion of the proximal RCA.  The distal vessel filled by collaterals from the left.  The LAD had 70% proximal to mid vessel stenosis.  There is a large first diagonal with 90% ostial stenosis.  Left circumflex had 60% proximal stenosis and then 80% mid vessel stenosis.  There were 2 marginal branches.  The first had about 80% stenosis.  Left ventricular ejection fraction was 55 to 65% with normal LVEDP.   The patient lives with his wife.  He reports some vague chest discomfort that is not necessarily associated with activity.  He does report exertional fatigue  and tiredness.  He is not very active due to his orthopedic problems.  He has had some shortness of breath with activity.     Past Medical History:  Diagnosis Date   Allergic rhinitis     Anxiety     Arteriosclerosis of thoracic aorta ([place])     Arthritis      "all over" (04/15/2013)   Back pain, chronic     BMI 32.0-32.9,adult     Cervicalgia     Chronic neck pain     COVID-19     DDD (degenerative disc disease), cervical     DDD (degenerative disc disease), lumbar     Diverticulosis     Elevated hemoglobin (HCC)     Fibromyalgia     GERD (gastroesophageal reflux disease)     Gout     Headache     High cholesterol     History of bronchitis     Hyperkalemia     Hypertension     Insomnia     Kidney disease with benign hypertension     Leg cramps      Right greater than left   Low back pain     Low testosterone     Melanoma ([place])      Nose   MRSA infection     Neuropathy of both feet     Peripheral vascular disease ([facility])  Personal history of colonic polyps     Polycythemia     Pre-diabetes     Radiculopathy of lumbar region     Raynaud phenomenon     Recurrent sinus infections     Right sided sciatica     Sleep apnea      "don't use CPAP" (04/15/2013)   Tobacco dependence             Past Surgical History:  Procedure Laterality Date   ANTERIOR CERVICAL DECOMP/DISCECTOMY FUSION   2000's X3    "had 3 surgeries on my front neck" (04/15/2013)   ANTERIOR LAT LUMBAR FUSION Left 08/06/2017    Procedure: LEFT LUMBAR TWO-THREE ANTERIOR LATERAL LUMBAR FUSION WITH PERCUTANEOUS SCREWS;  Surgeon: Earnie Larsson, MD;  Location: [facility];  Service: Neurosurgery;  Laterality: Left;   APPENDECTOMY   ~ 2010   BACK SURGERY       CARPAL TUNNEL RELEASE Left 1990's   CATARACT EXTRACTION, BILATERAL       COLONOSCOPY       ear drum surgery   1974   EYE SURGERY Right     FLEXIBLE SIGMOIDOSCOPY   12/21/2011    Procedure: FLEXIBLE SIGMOIDOSCOPY;  Surgeon: Lear Ng, MD;   Location: [facility];  Service: Endoscopy;  Laterality: N/A;   HAND TENDON SURGERY Left ~ 1978 X2-1980's X3    "had 5 ORs after laceration"   HOT HEMOSTASIS   12/21/2011    Procedure: HOT HEMOSTASIS (ARGON PLASMA COAGULATION/BICAP);  Surgeon: Lear Ng, MD;  Location: [facility];  Service: Endoscopy;  Laterality: N/A;   INGUINAL HERNIA REPAIR Left 04/15/2013    Procedure: LAPAROSCOPIC REPAIR OF INCARCERATED LEFT INGUINAL HERNIA;  Surgeon: Gayland Curry, MD;  Location: [facility];  Service: General;  Laterality: Left;   INGUINAL HERNIA REPAIR Bilateral 1970's - [place] Left 04/15/2013   INSERTION OF MESH Left 04/15/2013    Procedure: INSERTION OF MESH;  Surgeon: Gayland Curry, MD;  Location: [facility];  Service: General;  Laterality: Left;   INTRAVASCULAR PRESSURE WIRE/FFR STUDY N/A 02/03/2022    Procedure: INTRAVASCULAR PRESSURE WIRE/FFR STUDY;  Surgeon: Burnell Blanks, MD;  Location: [facility];  Service: Cardiovascular;  Laterality: N/A;   JOINT REPLACEMENT        right knee   KNEE ARTHROSCOPY Right 1990's   LAPAROSCOPY N/A 04/15/2013    Procedure: LAPAROSCOPY DIAGNOSTIC;  Surgeon: Gayland Curry, MD;  Location: [facility];  Service: General;  Laterality: N/A;   LEFT HEART CATH AND CORONARY ANGIOGRAPHY N/A 02/03/2022    Procedure: LEFT HEART CATH AND CORONARY ANGIOGRAPHY;  Surgeon: Burnell Blanks, MD;  Location: [facility];  Service: Cardiovascular;  Laterality: N/A;   LUMBAR LAMINECTOMY/DECOMPRESSION MICRODISCECTOMY Right 07/22/2015    Procedure: Right Lumbar Two-Three Microdiscectomy;  Surgeon: Earnie Larsson, MD;  Location: [facility];  Service: Neurosurgery;  Laterality: Right;   LUMBAR LAMINECTOMY/DECOMPRESSION MICRODISCECTOMY Right 09/05/2016    Procedure: Microdiscectomy - right - T12-L1;  Surgeon: Earnie Larsson, MD;  Location: [facility];  Service: Neurosurgery;  Laterality: Right;   NASAL SEPTUM SURGERY Bilateral 2014    "removed polyps  & infection"   POLYPECTOMY   01/10/2011    Procedure: POLYPECTOMY;  Surgeon: Lear Ng, MD;  [facility];  Service: Endoscopy;  Laterality: N/A;   POSTERIOR CERVICAL LAMINECTOMY Left 05/24/2020    Procedure: Laminectomy and Foraminotomy - left - Cervical two-Cervical three;  Surgeon: Earnie Larsson, MD;  Location: [facility];  Service: Neurosurgery;  Laterality: Left;   POSTERIOR LUMBAR FUSION   11/1995    "ray cages" (04/15/2013)   SHOULDER ARTHROSCOPY Bilateral      "twice each"   TONSILLECTOMY   1960's   TOTAL KNEE ARTHROPLASTY Right 02/24/2014    Procedure: TOTAL RIGHT KNEE ARTHROPLASTY;  Surgeon: Mauri Pole, MD;  [facility];  Service: Orthopedics;  Laterality: Right;   TOTAL SHOULDER ARTHROPLASTY Left 09/18/2019    Procedure: TOTAL SHOULDER ARTHROPLASTY;  Surgeon: Justice Britain, MD;  Location[facility];  Service: Orthopedics;  Laterality: Left;  173min   TOTAL SHOULDER ARTHROPLASTY Right 11/04/2020    Procedure: TOTAL SHOULDER ARTHROPLASTY;  Surgeon: Justice Britain, MD;  Location: [facility];  Service: Orthopedics;  Laterality: Right;  177min   UPPER GI ENDOSCOPY       VASECTOMY   1988   WISDOM TOOTH EXTRACTION   2000's           Family History  Problem Relation Age of Onset   Hypertension Father     Cancer Mother          breast    Heart Problems Mother     Alzheimer's disease Mother     Allergies Daughter     Interstitial cystitis Daughter        Social History Social History         Tobacco Use   Smoking status: Former      Packs/day: 0.10      Years: 15.00      Total pack years: 1.50      Types: Cigarettes, Cigars      Quit date: 01/08/2010      Years since quitting: 12.3      Passive exposure: Never   Smokeless tobacco: Never   Tobacco comments:      04/15/2013 "smoked ~ 1 pack/month"  Vaping Use   Vaping Use: Never used  Substance Use Topics   Alcohol use: Yes      Alcohol/week: 5.0 standard drinks of alcohol      Types: 1 Glasses of wine,  4 Cans of beer per week      Comment: 04/15/2013 "glass of wine w/dinner 2-3 times/month;  plus 3-4 beers/wk"   Drug use: No            Current Outpatient Medications  Medication Sig Dispense Refill   acetaminophen (TYLENOL) 500 MG tablet Take 500 mg by mouth every 8 (eight) hours as needed for moderate pain.       allopurinol (ZYLOPRIM) 300 MG tablet TAKE 1 AND 1/2 TABLETS BY MOUTH DAILY 135 tablet 0   ALPRAZolam (XANAX) 1 MG tablet Take 1 mg by mouth 2 (two) times daily as needed for anxiety.       aspirin EC 81 MG tablet Take 1 tablet (81 mg total) by mouth daily. Swallow whole. 90 tablet 3   atenolol (TENORMIN) 100 MG tablet Take 100 mg by mouth every morning.       b complex vitamins tablet Take 1 tablet by mouth in the morning.       Biotin 5000 MCG CAPS Take 5,000 mcg by mouth in the morning.       Calcium Carbonate (CALCIUM 600 PO) Take 600 mg by mouth in the morning.       carboxymethylcellulose (REFRESH PLUS) 0.5 % SOLN Place 1 drop into both eyes 2 (two) times daily as needed (Dry eyes).       colchicine 0.6  MG tablet TAKE 1 TABLET (0.6 MG TOTAL) BY MOUTH 2 (TWO) TIMES DAILY AS NEEDED. 180 tablet 0   cyclobenzaprine (FLEXERIL) 10 MG tablet Take 10 mg by mouth 2 (two) times daily as needed for muscle spasms.       Evolocumab (REPATHA SURECLICK) [id number] MG/ML SOAJ Inject 140 mg into the skin every 14 (fourteen) days. 2 mL 11   fish oil-omega-3 fatty acids 1000 MG capsule Take 1 g by mouth in the morning, at noon, and at bedtime.       fluticasone (FLONASE) 50 MCG/ACT nasal spray Place 1 spray into both nostrils 2 (two) times daily. For congestion       GINSENG PO Take 400 mg by mouth in the morning.       guaiFENesin (MUCINEX) 600 MG 12 hr tablet Take 600 mg by mouth at bedtime as needed (Congestion).       hydrocortisone cream 1 % Apply 1 application topically daily as needed for itching.       HYDROmorphone (DILAUDID) 4 MG tablet Take 4 mg by mouth every 8 (eight) hours as needed.        ibuprofen (ADVIL) 200 MG tablet Take 200 mg by mouth 2 (two) times daily as needed (pain.).       magnesium gluconate (MAGONATE) 500 MG tablet Take 500 mg by mouth in the morning.       Melatonin 10 MG TABS Take 10 mg by mouth at bedtime as needed (sleep).       Methylsulfonylmethane (MSM) 1000 MG CAPS Take 1,000 mg by mouth in the morning.       Multiple Vitamins-Minerals (MULTIVITAMIN WITH MINERALS) tablet Take 1 tablet by mouth daily.       neomycin-bacitracin-polymyxin (NEOSPORIN) ointment Apply 1 application topically daily as needed for wound care.       nitroGLYCERIN (NITROSTAT) 0.4 MG SL tablet Dissolve 1 tablet under the tongue every 5 minutes as needed for chest pain. Max of 3 doses, then 911. 25 tablet 6   NONFORMULARY OR COMPOUNDED ITEM Apply 1 g topically daily as needed (foot discomfort/pain.). Doxepin 5%, Amantadine 3%, Pentoxifylline 3%, Lamotrigine 2%, Sertraline 3%, Gabapentin 6% PCCA Lipoderm Base Cream       olmesartan (BENICAR) 40 MG tablet Take 40 mg by mouth in the morning.       omeprazole (PRILOSEC) 40 MG capsule Take 40 mg by mouth in the morning.       Psyllium (METAMUCIL FIBER PO) Take 1 Scoop by mouth daily in the afternoon.       RAPAFLO 8 MG CAPS capsule Take 8 mg by mouth at bedtime.   3   selenium 200 MCG TABS tablet Take 200 mcg by mouth in the morning.       sildenafil (VIAGRA) 100 MG tablet Take 100 mg by mouth daily as needed for erectile dysfunction. Per patient taking 1/2 tablet (50 mg) as needed       sodium chloride (OCEAN) 0.65 % SOLN nasal spray Place 1 spray into both nostrils as needed for congestion.       testosterone cypionate (DEPOTESTOTERONE CYPIONATE) 200 MG/ML injection Inject 100 mg into the muscle every 14 (fourteen) days. 0.5 mL       Turmeric 500 MG CAPS Take 500 mg by mouth in the morning.       venlafaxine (EFFEXOR-XR) 150 MG 24 hr capsule Take 150 mg by mouth daily with breakfast.       Vitamin D3 (VITAMIN D) 25  MCG tablet Take 1,000  Units by mouth in the morning.       vitamin E 400 UNIT capsule Take 400 Units by mouth in the morning.       amLODipine (NORVASC) 2.5 MG tablet Take 2.5 mg by mouth daily.        No current facility-administered medications for this visit.           Allergies  Allergen Reactions   Tetracyclines & Related Other (See Comments)      " makes me angry"   Doxycycline Other (See Comments)      Upset stomach   Niacin And Related Itching      Review of Systems  Constitutional:  Positive for activity change and fatigue.  HENT: Negative.    Eyes: Negative.   Respiratory:  Positive for shortness of breath.   Cardiovascular:  Positive for chest pain. Negative for palpitations and leg swelling.  Gastrointestinal: Negative.   Endocrine: Negative.   Genitourinary: Negative.   Musculoskeletal:  Positive for arthralgias, back pain and myalgias.  Skin: Negative.   Allergic/Immunologic: Negative.   Neurological:  Negative for dizziness and syncope.  Hematological: Negative.   Psychiatric/Behavioral: Negative.        BP 113/73   Pulse 67   Resp 20   Ht 5\' 8"  (1.727 m)   Wt 225 lb (102.1 kg)   BMI 34.21 kg/m  Physical Exam Constitutional:      Appearance: Normal appearance. He is obese.  HENT:     Head: Normocephalic and atraumatic.  Eyes:     Extraocular Movements: Extraocular movements intact.     Conjunctiva/sclera: Conjunctivae normal.     Pupils: Pupils are equal, round, and reactive to light.  Neck:     Vascular: No carotid bruit.  Cardiovascular:     Rate and Rhythm: Normal rate and regular rhythm.     Pulses: Normal pulses.     Heart sounds: Normal heart sounds. No murmur heard. Pulmonary:     Effort: Pulmonary effort is normal.     Breath sounds: Normal breath sounds.  Abdominal:     General: Abdomen is flat. There is no distension.     Palpations: Abdomen is soft.     Tenderness: There is no abdominal tenderness.  Musculoskeletal:        General: No swelling.   Skin:    General: Skin is warm and dry.  Neurological:     General: No focal deficit present.     Mental Status: He is alert and oriented to person, place, and time.  Psychiatric:        Mood and Affect: Mood normal.        Behavior: Behavior normal.          Diagnostic Tests:   Physicians   Panel Physicians Referring Physician Case Authorizing Physician  Burnell Blanks, MD (Primary)        Procedures   INTRAVASCULAR PRESSURE WIRE/FFR STUDY  LEFT HEART CATH AND CORONARY ANGIOGRAPHY    Conclusion       Prox RCA to Mid RCA lesion is 100% stenosed.   Prox Cx lesion is 60% stenosed.   Prox Cx to Mid Cx lesion is 80% stenosed.   1st Mrg lesion is 80% stenosed.   1st Diag lesion is 90% stenosed.   Prox LAD to Mid LAD lesion is 70% stenosed.   The left ventricular systolic function is normal.   LV end diastolic pressure is normal.   The  left ventricular ejection fraction is 55-65% by visual estimate.   Severe triple vessel CAD The LAD has a hazy, 70% proximal stenosis (RFR of 0.90 suggesting borderline flow limitation).  The Large Diagonal branch has severe ostial stenosis The Circumflex is a large caliber vessel with moderate proximal stenosis. The mid vessel has severe stenosis involving two obtuse marginal branches.  The Large dominant RCA has 100% proximal chronic total occlusion. The distal branches fill from left to right collaterals.  Preserved LV systolic function by LV gram.    Recommendations: He has multi-vessel CAD. He has no angina and what appears to be preserved LV systolic function by LV gram. He would benefit from bypass surgery for preservation of LV function. I will discharge him today after bedrest. Will review with Dr. Acie Fredrickson and plan echo then office follow up with Dr. Acie Fredrickson. We will most likely place an outpatient referral to CT surgery for CABG during his visit with Dr. Acie Fredrickson. An alternative option would be medical management of his CAD for  now given the fact that he is asymptomatic.      Indications   Coronary artery disease of native artery of native heart with stable angina pectoris ([place]) [id number] (ICD-10-CM)]    Procedural Details   Technical Details Indication: 67 yo male with history of HTN, HLD and tobacco abuse with recent abnormal coronary CT calcium score leading to abnormal PET stress test. Pt has rare chest pains.   Procedure: The risks, benefits, complications, treatment options, and expected outcomes were discussed with the patient. The patient and/or family concurred with the proposed plan, giving informed consent. The patient was sedated with Versed and Fentanyl. The right wrist was prepped and draped in a sterile fashion. 1% lidocaine was used for local anesthesia. Using the modified Seldinger access technique, a 6 French Slender sheath was placed in the right radial artery. 3 mg Verapamil was given through the sheath. Weight based IV heparin was given. Standard diagnostic catheters were used to perform selective coronary angiography. A JR4 catheter was used to perform a left ventricular angiogram. All catheter exchanges were performed over an exchange length guidewire.   The moderate to severe proximal LAD lesion was assessed with a flow wire. I engaged the left main with a XB LAD 3.5 guiding catheter. Additional IV heparin was given. I then passed a PressureWireX down the LAD. RFR of 0.90 suggesting the proximal LAD stenosis was moderately severe to severe. (Less than 0.90 would be flow limiting).   The sheath was removed from the right radial artery and a hemostasis band was applied at the arteriotomy site on the right wrist.      Estimated blood loss <50 mL.   During this procedure medications were administered to achieve and maintain moderate conscious sedation while the patient's heart rate, blood pressure, and oxygen saturation were continuously monitored and I was present face-to-face 100% of this time.     Medications (Filter: Administrations occurring from 1424 to 1523 on 02/03/22)  important  Continuous medications are totaled by the amount administered until 02/03/22 1523.    Heparin (Porcine) in NaCl 1000-0.9 UT/500ML-% SOLN (mL)  Total volume: 1,000 mL Date/Time Rate/Dose/Volume Action    02/03/22 1430 500 mL Given    1430 500 mL Given    fentaNYL (SUBLIMAZE) injection (mcg)  Total dose: 50 mcg Date/Time Rate/Dose/Volume Action    02/03/22 1444 50 mcg Given    midazolam (VERSED) injection (mg)  Total dose: 2 mg Date/Time Rate/Dose/Volume Action  02/03/22 1444 2 mg Given    lidocaine (PF) (XYLOCAINE) 1 % injection (mL)  Total volume: 2 mL Date/Time Rate/Dose/Volume Action    02/03/22 1449 2 mL Given    heparin sodium (porcine) injection (Units)  Total dose: 10,000 Units Date/Time Rate/Dose/Volume Action    02/03/22 1452 5,000 Units Given    1500 5,000 Units Given    Radial Cocktail/Verapamil only (mL)  Total volume: 10 mL Date/Time Rate/Dose/Volume Action    02/03/22 1450 10 mL Given    iohexol (OMNIPAQUE) 350 MG/ML injection (mL)  Total volume: 80 mL Date/Time Rate/Dose/Volume Action    02/03/22 1518 80 mL Given      Sedation Time   Sedation Time Physician-1: 29 minutes 46 seconds Contrast   Medication Name Total Dose  iohexol (OMNIPAQUE) 350 MG/ML injection 80 mL    Radiation/Fluoro   Fluoro time: 8.2 (min) DAP: 19.6 (Gycm2) Cumulative Air Kerma: 123456 (mGy) Complications   Complications documented before study signed (02/03/2022  [id number] PM)    No complications were associated with this study.  Documented by Adonis Housekeeper, RT - 02/03/2022  3:21 PM      Coronary Findings   Diagnostic Dominance: Right Left Anterior Descending  Prox LAD to Mid LAD lesion is 70% stenosed.    First Diagonal Branch  1st Diag lesion is 90% stenosed.    Left Circumflex  Vessel is large.  Prox Cx lesion is 60% stenosed.  Prox Cx to Mid Cx lesion is 80% stenosed.     First Obtuse Marginal Branch  1st Mrg lesion is 80% stenosed.    Right Coronary Artery  Vessel is large.  Prox RCA to Mid RCA lesion is 100% stenosed. The lesion is chronically occluded.    Third Right Posterolateral Branch  Collaterals  3rd RPL filled by collaterals from Dist Cx.       Intervention    No interventions have been documented.    Wall Motion       The following segments are normal: mid anterior, mid inferior, basilar anterior, basilar inferior, apical anterior and apical inferior.             Left Heart   Left Ventricle The left ventricular size is normal. The left ventricular systolic function is normal. LV end diastolic pressure is normal. The left ventricular ejection fraction is 55-65% by visual estimate. No regional wall motion abnormalities.    Coronary Diagrams   Diagnostic Dominance: Right  Intervention    Implants    No implant documentation for this case.    Syngo Images    Show images for CARDIAC CATHETERIZATION Images on Long Term Storage    Show images for Handlin, Victor Peluso "Richardson Landry" Link to Procedure Log   Procedure Log    Hemo Data   Flowsheet Row Most Recent Value  LV Systolic Pressure [id number] mmHg  LV Diastolic Pressure -1 mmHg  LV EDP 3 mmHg  AOp Systolic Pressure [id number] mmHg  AOp Diastolic Pressure 68 mmHg  AOp Mean Pressure [id number] mmHg  LVp Systolic Pressure [id number] mmHg  LVp Diastolic Pressure 2 mmHg  LVp EDP Pressure 12 mmHg      ECHOCARDIOGRAM REPORT       Patient Name:   Victor [name] Date of Exam: 03/07/2022  Medical Rec #:  [id number]             Height:       68.0 in  Accession #:    [id number]  Weight:       227.6 lb  Date of Birth:  [birth year]             BSA:          2.159 m  Patient Age:    21 years              BP:           114/80 mmHg  Patient Gender: M                     HR:           66 bpm.  Exam Location:  [facility]   Procedure: 2D Echo, Cardiac Doppler and Color Doppler    Indications:    I25.10 CAD    History:        Patient has no prior history of Echocardiogram  examinations.                 CAD; Risk Factors:Hypertension, Dyslipidemia and Former  Smoker.    Sonographer:   Coralyn Helling RDCS  Referring Phys: [place]     1. Left ventricular ejection fraction, by estimation, is 60 to 65%. The  left ventricle has normal function. The left ventricle has no regional  wall motion abnormalities. There is mild left ventricular hypertrophy.  Left ventricular diastolic parameters  are consistent with Grade I diastolic dysfunction (impaired relaxation).   2. Right ventricular systolic function is normal. The right ventricular  size is normal.   3. The mitral valve is normal in structure. No evidence of mitral valve  regurgitation. No evidence of mitral stenosis.   4. The aortic valve is tricuspid. Aortic valve regurgitation is mild. No  aortic stenosis is present. Aortic regurgitation PHT measures 701 msec.   5. Aortic dilatation noted. There is mild dilatation of the aortic root,  measuring 42 mm. There is mild dilatation of the ascending aorta,  measuring 43 mm.   6. The inferior vena cava is normal in size with greater than 50%  respiratory variability, suggesting right atrial pressure of 3 mmHg.   FINDINGS   Left Ventricle: Left ventricular ejection fraction, by estimation, is 60  to 65%. The left ventricle has normal function. The left ventricle has no  regional wall motion abnormalities. The left ventricular internal cavity  size was normal in size. There is   mild left ventricular hypertrophy. Left ventricular diastolic parameters  are consistent with Grade I diastolic dysfunction (impaired relaxation).   Right Ventricle: The right ventricular size is normal. No increase in  right ventricular wall thickness. Right ventricular systolic function is  normal.   Left Atrium: Left atrial size was normal in size.    Right Atrium: Right atrial size was normal in size.   Pericardium: There is no evidence of pericardial effusion.   Mitral Valve: The mitral valve is normal in structure. No evidence of  mitral valve regurgitation. No evidence of mitral valve stenosis.   Tricuspid Valve: The tricuspid valve is normal in structure. Tricuspid  valve regurgitation is not demonstrated. No evidence of tricuspid  stenosis.   Aortic Valve: The aortic valve is tricuspid. Aortic valve regurgitation is  mild. Aortic regurgitation PHT measures 701 msec. No aortic stenosis is  present.   Pulmonic Valve: The pulmonic valve was normal in structure. Pulmonic valve  regurgitation is not visualized. No evidence of pulmonic stenosis.   Aorta: Aortic dilatation noted. There is  mild dilatation of the aortic  root, measuring 42 mm. There is mild dilatation of the ascending aorta,  measuring 43 mm.   Venous: The inferior vena cava is normal in size with greater than 50%  respiratory variability, suggesting right atrial pressure of 3 mmHg.   IAS/Shunts: No atrial level shunt detected by color flow Doppler.     LEFT VENTRICLE  PLAX 2D  LVIDd:         4.90 cm   Diastology  LVIDs:         3.30 cm   LV e' medial:    6.85 cm/s  LV PW:         1.30 cm   LV E/e' medial:  7.3  LV IVS:        1.30 cm   LV e' lateral:   11.40 cm/s  LVOT diam:     2.20 cm   LV E/e' lateral: 4.4  LV SV:         64  LV SV Index:   30  LVOT Area:     3.80 cm     RIGHT VENTRICLE             IVC  RV S prime:     11.70 cm/s  IVC diam: 1.30 cm  TAPSE (M-mode): 1.4 cm   LEFT ATRIUM             Index        RIGHT ATRIUM           Index  LA diam:        4.30 cm 1.99 cm/m   RA Pressure: 3.00 mmHg  LA Vol (A2C):   47.3 ml 21.91 ml/m  RA Area:     15.50 cm  LA Vol (A4C):   35.1 ml 16.26 ml/m  RA Volume:   40.50 ml  18.76 ml/m  LA Biplane Vol: 41.2 ml 19.08 ml/m   AORTIC VALVE  LVOT Vmax:   79.80 cm/s  LVOT Vmean:  55.400 cm/s  LVOT  VTI:    0.169 m  AI PHT:      701 msec    AORTA  Ao Root diam: 4.20 cm  Ao Asc diam:  4.30 cm   MITRAL VALVE               TRICUSPID VALVE  MV Area (PHT): 2.45 cm    Estimated RAP:  3.00 mmHg  MV Decel Time: 310 msec  MV E velocity: 49.80 cm/s  SHUNTS  MV A velocity: 74.40 cm/s  Systemic VTI:  0.17 m  MV E/A ratio:  0.67        Systemic Diam: 2.20 cm   Candee Furbish MD  Electronically signed by Candee Furbish MD  Signature Date/Time: 03/07/2022/4:01:50 PM        Final          Impression:   This 66 year old gentleman has severe multivessel coronary disease with evidence of ischemia in the RCA territory on cardiac PET scan.  He is in need of multiple orthopedic surgeries and I think it would be best to proceed with coronary bypass graft surgery prior to that to minimize his cardiac risk.  I reviewed the catheterization and echo images with him and his wife and answered all their questions. I discussed the operative procedure with the patient and his wife including alternatives, benefits and risks; including but not limited to bleeding, blood transfusion, infection, stroke, myocardial infarction, graft failure, heart block  requiring a permanent pacemaker, organ dysfunction, and death.  Victor [name] understands and agrees to proceed.     Plan:   Coronary artery bypass graft surgery.     Gaye Pollack, MD [facility] [phone]

## 2022-05-19 ENCOUNTER — Other Ambulatory Visit: Payer: Self-pay

## 2022-05-19 ENCOUNTER — Inpatient Hospital Stay (HOSPITAL_COMMUNITY): Payer: Medicare Other

## 2022-05-19 ENCOUNTER — Encounter (HOSPITAL_COMMUNITY): Payer: Self-pay | Admitting: Surgery

## 2022-05-19 ENCOUNTER — Inpatient Hospital Stay (HOSPITAL_COMMUNITY)
Admission: RE | Disposition: A | Discharge status: Home Health | Payer: Self-pay | Source: Ambulatory Visit | Attending: Surgery

## 2022-05-19 ENCOUNTER — Inpatient Hospital Stay (HOSPITAL_COMMUNITY): Payer: Medicare Other | Admitting: Vascular Surgery

## 2022-05-19 ENCOUNTER — Inpatient Hospital Stay (HOSPITAL_COMMUNITY)
Admission: RE | Admit: 2022-05-19 | Discharge: 2022-05-25 | DRG: 236 | Disposition: A | Discharge status: Home Health | Payer: Medicare Other | Attending: Surgery | Admitting: Surgery

## 2022-05-19 ENCOUNTER — Inpatient Hospital Stay (HOSPITAL_COMMUNITY): Payer: Medicare Other | Admitting: Certified Registered Nurse Anesthetist

## 2022-05-19 DIAGNOSIS — Y793 Surgical instruments, materials and orthopedic devices (including sutures) associated with adverse incidents: Secondary | ICD-10-CM | POA: Diagnosis present

## 2022-05-19 DIAGNOSIS — M797 Fibromyalgia: Secondary | ICD-10-CM | POA: Diagnosis not present

## 2022-05-19 DIAGNOSIS — I7300 Raynaud's syndrome without gangrene: Secondary | ICD-10-CM | POA: Diagnosis present

## 2022-05-19 DIAGNOSIS — K219 Gastro-esophageal reflux disease without esophagitis: Secondary | ICD-10-CM | POA: Diagnosis present

## 2022-05-19 DIAGNOSIS — Z1152 Encounter for screening for COVID-19: Secondary | ICD-10-CM | POA: Diagnosis not present

## 2022-05-19 DIAGNOSIS — I25119 Atherosclerotic heart disease of native coronary artery with unspecified angina pectoris: Secondary | ICD-10-CM

## 2022-05-19 DIAGNOSIS — E8770 Fluid overload, unspecified: Secondary | ICD-10-CM | POA: Diagnosis not present

## 2022-05-19 DIAGNOSIS — Z96611 Presence of right artificial shoulder joint: Secondary | ICD-10-CM | POA: Diagnosis present

## 2022-05-19 DIAGNOSIS — Z87891 Personal history of nicotine dependence: Secondary | ICD-10-CM

## 2022-05-19 DIAGNOSIS — I129 Hypertensive chronic kidney disease with stage 1 through stage 4 chronic kidney disease, or unspecified chronic kidney disease: Secondary | ICD-10-CM | POA: Diagnosis not present

## 2022-05-19 DIAGNOSIS — I2510 Atherosclerotic heart disease of native coronary artery without angina pectoris: Secondary | ICD-10-CM | POA: Diagnosis not present

## 2022-05-19 DIAGNOSIS — F419 Anxiety disorder, unspecified: Secondary | ICD-10-CM

## 2022-05-19 DIAGNOSIS — Z79899 Other long term (current) drug therapy: Secondary | ICD-10-CM

## 2022-05-19 DIAGNOSIS — T84028A Dislocation of other internal joint prosthesis, initial encounter: Secondary | ICD-10-CM | POA: Diagnosis not present

## 2022-05-19 DIAGNOSIS — Z85820 Personal history of malignant melanoma of skin: Secondary | ICD-10-CM | POA: Diagnosis not present

## 2022-05-19 DIAGNOSIS — Z881 Allergy status to other antibiotic agents status: Secondary | ICD-10-CM

## 2022-05-19 DIAGNOSIS — E7800 Pure hypercholesterolemia, unspecified: Secondary | ICD-10-CM | POA: Diagnosis not present

## 2022-05-19 DIAGNOSIS — M159 Polyosteoarthritis, unspecified: Secondary | ICD-10-CM | POA: Diagnosis present

## 2022-05-19 DIAGNOSIS — I700 Atherosclerosis of aorta: Secondary | ICD-10-CM | POA: Diagnosis not present

## 2022-05-19 DIAGNOSIS — G4733 Obstructive sleep apnea (adult) (pediatric): Secondary | ICD-10-CM | POA: Diagnosis present

## 2022-05-19 DIAGNOSIS — J90 Pleural effusion, not elsewhere classified: Secondary | ICD-10-CM | POA: Diagnosis not present

## 2022-05-19 DIAGNOSIS — Z7982 Long term (current) use of aspirin: Secondary | ICD-10-CM

## 2022-05-19 DIAGNOSIS — I10 Essential (primary) hypertension: Secondary | ICD-10-CM | POA: Diagnosis not present

## 2022-05-19 DIAGNOSIS — M109 Gout, unspecified: Secondary | ICD-10-CM | POA: Diagnosis present

## 2022-05-19 DIAGNOSIS — Z4682 Encounter for fitting and adjustment of non-vascular catheter: Secondary | ICD-10-CM | POA: Diagnosis not present

## 2022-05-19 DIAGNOSIS — R7303 Prediabetes: Secondary | ICD-10-CM | POA: Diagnosis present

## 2022-05-19 DIAGNOSIS — E669 Obesity, unspecified: Secondary | ICD-10-CM | POA: Diagnosis present

## 2022-05-19 DIAGNOSIS — Z96651 Presence of right artificial knee joint: Secondary | ICD-10-CM | POA: Diagnosis present

## 2022-05-19 DIAGNOSIS — N400 Enlarged prostate without lower urinary tract symptoms: Secondary | ICD-10-CM | POA: Diagnosis present

## 2022-05-19 DIAGNOSIS — I4891 Unspecified atrial fibrillation: Secondary | ICD-10-CM | POA: Diagnosis not present

## 2022-05-19 DIAGNOSIS — Z452 Encounter for adjustment and management of vascular access device: Secondary | ICD-10-CM | POA: Diagnosis not present

## 2022-05-19 DIAGNOSIS — I088 Other rheumatic multiple valve diseases: Secondary | ICD-10-CM | POA: Diagnosis not present

## 2022-05-19 DIAGNOSIS — Z820 Family history of epilepsy and other diseases of the nervous system: Secondary | ICD-10-CM

## 2022-05-19 DIAGNOSIS — M4800 Spinal stenosis, site unspecified: Secondary | ICD-10-CM | POA: Diagnosis present

## 2022-05-19 DIAGNOSIS — Z8249 Family history of ischemic heart disease and other diseases of the circulatory system: Secondary | ICD-10-CM

## 2022-05-19 DIAGNOSIS — D62 Acute posthemorrhagic anemia: Secondary | ICD-10-CM | POA: Diagnosis not present

## 2022-05-19 DIAGNOSIS — Z6834 Body mass index (BMI) 34.0-34.9, adult: Secondary | ICD-10-CM

## 2022-05-19 DIAGNOSIS — Z803 Family history of malignant neoplasm of breast: Secondary | ICD-10-CM

## 2022-05-19 DIAGNOSIS — Z888 Allergy status to other drugs, medicaments and biological substances status: Secondary | ICD-10-CM

## 2022-05-19 DIAGNOSIS — G8929 Other chronic pain: Secondary | ICD-10-CM | POA: Diagnosis present

## 2022-05-19 DIAGNOSIS — J9811 Atelectasis: Secondary | ICD-10-CM | POA: Diagnosis not present

## 2022-05-19 DIAGNOSIS — Z951 Presence of aortocoronary bypass graft: Principal | ICD-10-CM

## 2022-05-19 DIAGNOSIS — Z981 Arthrodesis status: Secondary | ICD-10-CM

## 2022-05-19 DIAGNOSIS — Z9889 Other specified postprocedural states: Secondary | ICD-10-CM | POA: Diagnosis not present

## 2022-05-19 HISTORY — PX: CORONARY ARTERY BYPASS GRAFT: SHX141

## 2022-05-19 HISTORY — PX: TEE WITHOUT CARDIOVERSION: SHX5443

## 2022-05-19 LAB — POCT I-STAT 7, (LYTES, BLD GAS, ICA,H+H)
Acid-Base Excess: 0 mmol/L (ref 0.0–2.0)
Acid-Base Excess: 1 mmol/L (ref 0.0–2.0)
Acid-base deficit: 2 mmol/L (ref 0.0–2.0)
Acid-base deficit: 1 mmol/L (ref 0.0–2.0)
Acid-base deficit: 3 mmol/L — ABNORMAL HIGH (ref 0.0–2.0)
Acid-base deficit: 2 mmol/L (ref 0.0–2.0)
Acid-base deficit: 1 mmol/L (ref 0.0–2.0)
Bicarbonate: 23.7 mmol/L (ref 20.0–28.0)
Bicarbonate: 24.8 mmol/L (ref 20.0–28.0)
Bicarbonate: 25.5 mmol/L (ref 20.0–28.0)
Bicarbonate: 24.4 mmol/L (ref 20.0–28.0)
Bicarbonate: 22.7 mmol/L (ref 20.0–28.0)
Bicarbonate: 22.9 mmol/L (ref 20.0–28.0)
Bicarbonate: 24 mmol/L (ref 20.0–28.0)
Calcium, Ion: 1.24 mmol/L (ref 1.15–1.40)
Calcium, Ion: 0.95 mmol/L — ABNORMAL LOW (ref 1.15–1.40)
Calcium, Ion: 1.07 mmol/L — ABNORMAL LOW (ref 1.15–1.40)
Calcium, Ion: 1.02 mmol/L — ABNORMAL LOW (ref 1.15–1.40)
Calcium, Ion: 1.08 mmol/L — ABNORMAL LOW (ref 1.15–1.40)
Calcium, Ion: 1.11 mmol/L — ABNORMAL LOW (ref 1.15–1.40)
Calcium, Ion: 1.12 mmol/L — ABNORMAL LOW (ref 1.15–1.40)
HCT: 38 % — ABNORMAL LOW (ref 39.0–52.0)
HCT: 29 % — ABNORMAL LOW (ref 39.0–52.0)
HCT: 32 % — ABNORMAL LOW (ref 39.0–52.0)
HCT: 30 % — ABNORMAL LOW (ref 39.0–52.0)
HCT: 32 % — ABNORMAL LOW (ref 39.0–52.0)
HCT: 30 % — ABNORMAL LOW (ref 39.0–52.0)
HCT: 32 % — ABNORMAL LOW (ref 39.0–52.0)
Hemoglobin: 12.9 g/dL — ABNORMAL LOW (ref 13.0–17.0)
Hemoglobin: 9.9 g/dL — ABNORMAL LOW (ref 13.0–17.0)
Hemoglobin: 10.9 g/dL — ABNORMAL LOW (ref 13.0–17.0)
Hemoglobin: 10.2 g/dL — ABNORMAL LOW (ref 13.0–17.0)
Hemoglobin: 10.9 g/dL — ABNORMAL LOW (ref 13.0–17.0)
Hemoglobin: 10.2 g/dL — ABNORMAL LOW (ref 13.0–17.0)
Hemoglobin: 10.9 g/dL — ABNORMAL LOW (ref 13.0–17.0)
O2 Saturation: 100 %
O2 Saturation: 100 %
O2 Saturation: 100 %
O2 Saturation: 99 %
O2 Saturation: 97 %
O2 Saturation: 98 %
O2 Saturation: 95 %
Patient temperature: 36
Patient temperature: 36.6
Patient temperature: 37.4
Potassium: 4.2 mmol/L (ref 3.5–5.1)
Potassium: 4.6 mmol/L (ref 3.5–5.1)
Potassium: 5 mmol/L (ref 3.5–5.1)
Potassium: 4.7 mmol/L (ref 3.5–5.1)
Potassium: 4.3 mmol/L (ref 3.5–5.1)
Potassium: 4.4 mmol/L (ref 3.5–5.1)
Potassium: 4.6 mmol/L (ref 3.5–5.1)
Sodium: 137 mmol/L (ref 135–145)
Sodium: 138 mmol/L (ref 135–145)
Sodium: 135 mmol/L (ref 135–145)
Sodium: 138 mmol/L (ref 135–145)
Sodium: 139 mmol/L (ref 135–145)
Sodium: 140 mmol/L (ref 135–145)
Sodium: 139 mmol/L (ref 135–145)
TCO2: 25 mmol/L (ref 22–32)
TCO2: 26 mmol/L (ref 22–32)
TCO2: 27 mmol/L (ref 22–32)
TCO2: 26 mmol/L (ref 22–32)
TCO2: 24 mmol/L (ref 22–32)
TCO2: 24 mmol/L (ref 22–32)
TCO2: 25 mmol/L (ref 22–32)
pCO2 arterial: 44 mmHg (ref 32–48)
pCO2 arterial: 41.5 mmHg (ref 32–48)
pCO2 arterial: 38.2 mmHg (ref 32–48)
pCO2 arterial: 40.7 mmHg (ref 32–48)
pCO2 arterial: 41.4 mmHg (ref 32–48)
pCO2 arterial: 39.2 mmHg (ref 32–48)
pCO2 arterial: 42.3 mmHg (ref 32–48)
pH, Arterial: 7.339 — ABNORMAL LOW (ref 7.35–7.45)
pH, Arterial: 7.383 (ref 7.35–7.45)
pH, Arterial: 7.434 (ref 7.35–7.45)
pH, Arterial: 7.386 (ref 7.35–7.45)
pH, Arterial: 7.341 — ABNORMAL LOW (ref 7.35–7.45)
pH, Arterial: 7.372 (ref 7.35–7.45)
pH, Arterial: 7.364 (ref 7.35–7.45)
pO2, Arterial: 282 mmHg — ABNORMAL HIGH (ref 83–108)
pO2, Arterial: 414 mmHg — ABNORMAL HIGH (ref 83–108)
pO2, Arterial: 403 mmHg — ABNORMAL HIGH (ref 83–108)
pO2, Arterial: 165 mmHg — ABNORMAL HIGH (ref 83–108)
pO2, Arterial: 87 mmHg (ref 83–108)
pO2, Arterial: 110 mmHg — ABNORMAL HIGH (ref 83–108)
pO2, Arterial: 82 mmHg — ABNORMAL LOW (ref 83–108)

## 2022-05-19 LAB — BASIC METABOLIC PANEL
Anion gap: 6 (ref 5–15)
BUN: 10 mg/dL (ref 8–23)
CO2: 24 mmol/L (ref 22–32)
Calcium: 7.5 mg/dL — ABNORMAL LOW (ref 8.9–10.3)
Chloride: 107 mmol/L (ref 98–111)
Creatinine, Ser: 0.81 mg/dL (ref 0.61–1.24)
GFR, Estimated: >60 mL/min (ref >60)
Glucose, Bld: 103 mg/dL — ABNORMAL HIGH (ref 70–99)
Potassium: 4.4 mmol/L (ref 3.5–5.1)
Sodium: 137 mmol/L (ref 135–145)

## 2022-05-19 LAB — GLUCOSE, CAPILLARY
Glucose-Capillary: 100 mg/dL — ABNORMAL HIGH (ref 70–99)
Glucose-Capillary: 101 mg/dL — ABNORMAL HIGH (ref 70–99)
Glucose-Capillary: 103 mg/dL — ABNORMAL HIGH (ref 70–99)
Glucose-Capillary: 104 mg/dL — ABNORMAL HIGH (ref 70–99)
Glucose-Capillary: 104 mg/dL — ABNORMAL HIGH (ref 70–99)
Glucose-Capillary: 107 mg/dL — ABNORMAL HIGH (ref 70–99)
Glucose-Capillary: 108 mg/dL — ABNORMAL HIGH (ref 70–99)
Glucose-Capillary: 115 mg/dL — ABNORMAL HIGH (ref 70–99)
Glucose-Capillary: 126 mg/dL — ABNORMAL HIGH (ref 70–99)
Glucose-Capillary: 137 mg/dL — ABNORMAL HIGH (ref 70–99)
Glucose-Capillary: 93 mg/dL (ref 70–99)

## 2022-05-19 LAB — POCT I-STAT, CHEM 8
BUN: 14 mg/dL (ref 8–23)
BUN: 14 mg/dL (ref 8–23)
BUN: 13 mg/dL (ref 8–23)
BUN: 12 mg/dL (ref 8–23)
BUN: 12 mg/dL (ref 8–23)
Calcium, Ion: 1.23 mmol/L (ref 1.15–1.40)
Calcium, Ion: 1.18 mmol/L (ref 1.15–1.40)
Calcium, Ion: 1.02 mmol/L — ABNORMAL LOW (ref 1.15–1.40)
Calcium, Ion: 1.02 mmol/L — ABNORMAL LOW (ref 1.15–1.40)
Calcium, Ion: 1.03 mmol/L — ABNORMAL LOW (ref 1.15–1.40)
Chloride: 101 mmol/L (ref 98–111)
Chloride: 101 mmol/L (ref 98–111)
Chloride: 100 mmol/L (ref 98–111)
Chloride: 100 mmol/L (ref 98–111)
Chloride: 101 mmol/L (ref 98–111)
Creatinine, Ser: 0.8 mg/dL (ref 0.61–1.24)
Creatinine, Ser: 0.7 mg/dL (ref 0.61–1.24)
Creatinine, Ser: 0.7 mg/dL (ref 0.61–1.24)
Creatinine, Ser: 0.7 mg/dL (ref 0.61–1.24)
Creatinine, Ser: 0.8 mg/dL (ref 0.61–1.24)
Glucose, Bld: 111 mg/dL — ABNORMAL HIGH (ref 70–99)
Glucose, Bld: 105 mg/dL — ABNORMAL HIGH (ref 70–99)
Glucose, Bld: 97 mg/dL (ref 70–99)
Glucose, Bld: 106 mg/dL — ABNORMAL HIGH (ref 70–99)
Glucose, Bld: 112 mg/dL — ABNORMAL HIGH (ref 70–99)
HCT: 37 % — ABNORMAL LOW (ref 39.0–52.0)
HCT: 38 % — ABNORMAL LOW (ref 39.0–52.0)
HCT: 29 % — ABNORMAL LOW (ref 39.0–52.0)
HCT: 28 % — ABNORMAL LOW (ref 39.0–52.0)
HCT: 31 % — ABNORMAL LOW (ref 39.0–52.0)
Hemoglobin: 12.6 g/dL — ABNORMAL LOW (ref 13.0–17.0)
Hemoglobin: 12.9 g/dL — ABNORMAL LOW (ref 13.0–17.0)
Hemoglobin: 9.9 g/dL — ABNORMAL LOW (ref 13.0–17.0)
Hemoglobin: 10.5 g/dL — ABNORMAL LOW (ref 13.0–17.0)
Hemoglobin: 9.5 g/dL — ABNORMAL LOW (ref 13.0–17.0)
Potassium: 4.2 mmol/L (ref 3.5–5.1)
Potassium: 4.3 mmol/L (ref 3.5–5.1)
Potassium: 4.9 mmol/L (ref 3.5–5.1)
Potassium: 4.7 mmol/L (ref 3.5–5.1)
Potassium: 5.3 mmol/L — ABNORMAL HIGH (ref 3.5–5.1)
Sodium: 137 mmol/L (ref 135–145)
Sodium: 137 mmol/L (ref 135–145)
Sodium: 134 mmol/L — ABNORMAL LOW (ref 135–145)
Sodium: 136 mmol/L (ref 135–145)
Sodium: 138 mmol/L (ref 135–145)
TCO2: 26 mmol/L (ref 22–32)
TCO2: 27 mmol/L (ref 22–32)
TCO2: 29 mmol/L (ref 22–32)
TCO2: 26 mmol/L (ref 22–32)
TCO2: 29 mmol/L (ref 22–32)

## 2022-05-19 LAB — CBC
HCT: 32 % — ABNORMAL LOW (ref 39.0–52.0)
HCT: 31.7 % — ABNORMAL LOW (ref 39.0–52.0)
Hemoglobin: 10.6 g/dL — ABNORMAL LOW (ref 13.0–17.0)
Hemoglobin: 10.9 g/dL — ABNORMAL LOW (ref 13.0–17.0)
MCH: 31.6 pg (ref 26.0–34.0)
MCH: 32 pg (ref 26.0–34.0)
MCHC: 33.1 g/dL (ref 30.0–36.0)
MCHC: 34.4 g/dL (ref 30.0–36.0)
MCV: 95.5 fL (ref 80.0–100.0)
MCV: 93 fL (ref 80.0–100.0)
Platelets: 146 10*3/uL — ABNORMAL LOW (ref 150–400)
Platelets: 148 10*3/uL — ABNORMAL LOW (ref 150–400)
RBC: 3.35 MIL/uL — ABNORMAL LOW (ref 4.22–5.81)
RBC: 3.41 MIL/uL — ABNORMAL LOW (ref 4.22–5.81)
RDW: 14.3 % (ref 11.5–15.5)
RDW: 14.3 % (ref 11.5–15.5)
WBC: 8.9 10*3/uL (ref 4.0–10.5)
WBC: 8.2 10*3/uL (ref 4.0–10.5)
nRBC: 0 % (ref 0.0–0.2)
nRBC: 0 % (ref 0.0–0.2)

## 2022-05-19 LAB — POCT I-STAT EG7
Acid-Base Excess: 6 mmol/L — ABNORMAL HIGH (ref 0.0–2.0)
Bicarbonate: 31.8 mmol/L — ABNORMAL HIGH (ref 20.0–28.0)
Calcium, Ion: 1.05 mmol/L — ABNORMAL LOW (ref 1.15–1.40)
HCT: 30 % — ABNORMAL LOW (ref 39.0–52.0)
Hemoglobin: 10.2 g/dL — ABNORMAL LOW (ref 13.0–17.0)
O2 Saturation: 82 %
Potassium: 5 mmol/L (ref 3.5–5.1)
Sodium: 142 mmol/L (ref 135–145)
TCO2: 33 mmol/L — ABNORMAL HIGH (ref 22–32)
pCO2, Ven: 53.6 mmHg (ref 44–60)
pH, Ven: 7.381 (ref 7.25–7.43)
pO2, Ven: 48 mmHg — ABNORMAL HIGH (ref 32–45)

## 2022-05-19 LAB — APTT: aPTT: 34 seconds (ref 24–36)

## 2022-05-19 LAB — PROTIME-INR
INR: 1.3 — ABNORMAL HIGH (ref 0.8–1.2)
Prothrombin Time: 16.4 seconds — ABNORMAL HIGH (ref 11.4–15.2)

## 2022-05-19 LAB — ECHO INTRAOPERATIVE TEE
AV Mean grad: 3 mmHg
Height: 68 in
Weight: 3679.99 oz

## 2022-05-19 LAB — HEMOGLOBIN AND HEMATOCRIT, BLOOD
HCT: 31.3 % — ABNORMAL LOW (ref 39.0–52.0)
Hemoglobin: 10.9 g/dL — ABNORMAL LOW (ref 13.0–17.0)

## 2022-05-19 LAB — MAGNESIUM: Magnesium: 3.3 mg/dL — ABNORMAL HIGH (ref 1.7–2.4)

## 2022-05-19 LAB — PLATELET COUNT: Platelets: 149 10*3/uL — ABNORMAL LOW (ref 150–400)

## 2022-05-19 SURGERY — CORONARY ARTERY BYPASS GRAFTING (CABG)
Anesthesia: General | Site: Chest

## 2022-05-19 MED ORDER — MIDAZOLAM HCL 2 MG/2ML IJ SOLN: 2.0000 mg | INTRAMUSCULAR | Status: DC | PRN

## 2022-05-19 MED ORDER — HYDROMORPHONE HCL 2 MG PO TABS
4.0000 mg | ORAL_TABLET | Freq: Three times a day (TID) | ORAL | Status: DC | PRN
  Administered 2022-05-19 – 2022-05-22 (×8): 4 mg via ORAL
  Filled 2022-05-19 (×8): qty 2

## 2022-05-19 MED ORDER — POTASSIUM CHLORIDE 10 MEQ/50ML IV SOLN: 10.0000 meq | INTRAVENOUS | Status: AC

## 2022-05-19 MED ORDER — METOPROLOL TARTRATE 12.5 MG HALF TABLET
12.5000 mg | ORAL_TABLET | Freq: Once | ORAL | Status: DC
  Filled 2022-05-19: qty 1

## 2022-05-19 MED ORDER — DEXTROSE 50 % IV SOLN: 0.0000 mL | INTRAVENOUS | Status: DC | PRN

## 2022-05-19 MED ORDER — ONDANSETRON HCL 4 MG/2ML IJ SOLN: 4.0000 mg | Freq: Four times a day (QID) | INTRAMUSCULAR | Status: DC | PRN

## 2022-05-19 MED ORDER — HEPARIN SODIUM (PORCINE) 1000 UNIT/ML IJ SOLN
INTRAMUSCULAR | Status: AC
  Filled 2022-05-19: qty 10

## 2022-05-19 MED ORDER — LACTATED RINGERS IV SOLN: INTRAVENOUS | Status: DC

## 2022-05-19 MED ORDER — THROMBIN 20000 UNITS EX SOLR: OROMUCOSAL | Status: DC | PRN

## 2022-05-19 MED ORDER — TRAMADOL HCL 50 MG PO TABS
50.0000 mg | ORAL_TABLET | ORAL | Status: DC | PRN
  Administered 2022-05-19 – 2022-05-20 (×2): 100 mg via ORAL
  Filled 2022-05-19 (×2): qty 2

## 2022-05-19 MED ORDER — PROPOFOL 10 MG/ML IV BOLUS
INTRAVENOUS | Status: AC
  Filled 2022-05-19: qty 20

## 2022-05-19 MED ORDER — DOCUSATE SODIUM 100 MG PO CAPS
200.0000 mg | ORAL_CAPSULE | Freq: Every day | ORAL | Status: DC
  Administered 2022-05-20 – 2022-05-22 (×3): 200 mg via ORAL
  Filled 2022-05-19 (×3): qty 2

## 2022-05-19 MED ORDER — INSULIN ASPART 100 UNIT/ML IJ SOLN
0.0000 [IU] | INTRAMUSCULAR | Status: DC
  Administered 2022-05-20 – 2022-05-21 (×3): 2 [IU] via SUBCUTANEOUS

## 2022-05-19 MED ORDER — SODIUM CHLORIDE 0.9 % IV SOLN: INTRAVENOUS | Status: DC | PRN

## 2022-05-19 MED ORDER — CEFAZOLIN SODIUM-DEXTROSE 2-4 GM/100ML-% IV SOLN
2.0000 g | Freq: Three times a day (TID) | INTRAVENOUS | Status: AC
  Administered 2022-05-19 – 2022-05-21 (×6): 2 g via INTRAVENOUS
  Filled 2022-05-19 (×6): qty 100

## 2022-05-19 MED ORDER — PLASMA-LYTE A IV SOLN: INTRAVENOUS | Status: DC | PRN

## 2022-05-19 MED ORDER — ROCURONIUM BROMIDE 10 MG/ML (PF) SYRINGE
PREFILLED_SYRINGE | INTRAVENOUS | Status: DC | PRN
  Administered 2022-05-19: 50 mg via INTRAVENOUS
  Administered 2022-05-19: 30 mg via INTRAVENOUS
  Administered 2022-05-19: 50 mg via INTRAVENOUS
  Administered 2022-05-19: 70 mg via INTRAVENOUS

## 2022-05-19 MED ORDER — BISACODYL 10 MG RE SUPP: 10.0000 mg | Freq: Every day | RECTAL | Status: DC

## 2022-05-19 MED ORDER — FAMOTIDINE IN NACL 20-0.9 MG/50ML-% IV SOLN
20.0000 mg | Freq: Two times a day (BID) | INTRAVENOUS | Status: AC
  Administered 2022-05-19 (×2): 20 mg via INTRAVENOUS
  Filled 2022-05-19 (×2): qty 50

## 2022-05-19 MED ORDER — MAGNESIUM SULFATE 4 GM/100ML IV SOLN
4.0000 g | Freq: Once | INTRAVENOUS | Status: AC
  Administered 2022-05-19: 4 g via INTRAVENOUS
  Filled 2022-05-19: qty 100

## 2022-05-19 MED ORDER — ALBUMIN HUMAN 5 % IV SOLN: INTRAVENOUS | Status: DC | PRN

## 2022-05-19 MED ORDER — FENTANYL CITRATE (PF) 250 MCG/5ML IJ SOLN
INTRAMUSCULAR | Status: AC
  Filled 2022-05-19: qty 5

## 2022-05-19 MED ORDER — SODIUM CHLORIDE 0.45 % IV SOLN: INTRAVENOUS | Status: DC | PRN

## 2022-05-19 MED ORDER — DEXMEDETOMIDINE HCL IN NACL 400 MCG/100ML IV SOLN
0.0000 ug/kg/h | INTRAVENOUS | Status: DC
  Administered 2022-05-19: 0.6 ug/kg/h via INTRAVENOUS
  Filled 2022-05-19: qty 100

## 2022-05-19 MED ORDER — LACTATED RINGERS IV SOLN: INTRAVENOUS | Status: DC | PRN

## 2022-05-19 MED ORDER — VANCOMYCIN HCL IN DEXTROSE 1-5 GM/200ML-% IV SOLN
1000.0000 mg | Freq: Once | INTRAVENOUS | Status: AC
  Administered 2022-05-19: 1000 mg via INTRAVENOUS
  Filled 2022-05-19: qty 200

## 2022-05-19 MED ORDER — 0.9 % SODIUM CHLORIDE (POUR BTL) OPTIME
TOPICAL | Status: DC | PRN
  Administered 2022-05-19: 5000 mL

## 2022-05-19 MED ORDER — CHLORHEXIDINE GLUCONATE 0.12 % MT SOLN
15.0000 mL | OROMUCOSAL | Status: AC
  Administered 2022-05-19: 15 mL via OROMUCOSAL
  Filled 2022-05-19: qty 15

## 2022-05-19 MED ORDER — CHLORHEXIDINE GLUCONATE 0.12 % MT SOLN
15.0000 mL | Freq: Once | OROMUCOSAL | Status: AC
  Administered 2022-05-19: 15 mL via OROMUCOSAL
  Filled 2022-05-19: qty 15

## 2022-05-19 MED ORDER — FENTANYL CITRATE (PF) 250 MCG/5ML IJ SOLN
INTRAMUSCULAR | Status: DC | PRN
  Administered 2022-05-19: 150 ug via INTRAVENOUS
  Administered 2022-05-19 (×2): 50 ug via INTRAVENOUS
  Administered 2022-05-19 (×2): 100 ug via INTRAVENOUS
  Administered 2022-05-19: 600 ug via INTRAVENOUS
  Administered 2022-05-19 (×2): 50 ug via INTRAVENOUS

## 2022-05-19 MED ORDER — SODIUM CHLORIDE 0.9% FLUSH: 3.0000 mL | INTRAVENOUS | Status: DC | PRN

## 2022-05-19 MED ORDER — OXYCODONE HCL 5 MG PO TABS
5.0000 mg | ORAL_TABLET | ORAL | Status: DC | PRN
  Administered 2022-05-19 – 2022-05-22 (×9): 10 mg via ORAL
  Administered 2022-05-22: 5 mg via ORAL
  Administered 2022-05-22 – 2022-05-23 (×3): 10 mg via ORAL
  Administered 2022-05-23 – 2022-05-24 (×3): 5 mg via ORAL
  Administered 2022-05-24: 10 mg via ORAL
  Administered 2022-05-25: 5 mg via ORAL
  Filled 2022-05-19: qty 2
  Filled 2022-05-19: qty 1
  Filled 2022-05-19 (×3): qty 2
  Filled 2022-05-19: qty 1
  Filled 2022-05-19: qty 2
  Filled 2022-05-19: qty 1
  Filled 2022-05-19 (×2): qty 2
  Filled 2022-05-19: qty 1
  Filled 2022-05-19 (×5): qty 2
  Filled 2022-05-19: qty 1
  Filled 2022-05-19: qty 2

## 2022-05-19 MED ORDER — SODIUM BICARBONATE 8.4 % IV SOLN
50.0000 meq | Freq: Once | INTRAVENOUS | Status: AC
  Administered 2022-05-19: 50 meq via INTRAVENOUS

## 2022-05-19 MED ORDER — ALPRAZOLAM 0.5 MG PO TABS
1.0000 mg | ORAL_TABLET | Freq: Two times a day (BID) | ORAL | Status: DC | PRN
  Administered 2022-05-20 – 2022-05-24 (×4): 1 mg via ORAL
  Filled 2022-05-19 (×4): qty 2

## 2022-05-19 MED ORDER — HEPARIN SODIUM (PORCINE) 1000 UNIT/ML IJ SOLN
INTRAMUSCULAR | Status: DC | PRN
  Administered 2022-05-19: 40000 [IU] via INTRAVENOUS

## 2022-05-19 MED ORDER — INSULIN REGULAR(HUMAN) IN NACL 100-0.9 UT/100ML-% IV SOLN: INTRAVENOUS | Status: DC

## 2022-05-19 MED ORDER — TAMSULOSIN HCL 0.4 MG PO CAPS
0.4000 mg | ORAL_CAPSULE | Freq: Every day | ORAL | Status: DC
  Administered 2022-05-20 – 2022-05-24 (×5): 0.4 mg via ORAL
  Filled 2022-05-19 (×5): qty 1

## 2022-05-19 MED ORDER — PHENYLEPHRINE 80 MCG/ML (10ML) SYRINGE FOR IV PUSH (FOR BLOOD PRESSURE SUPPORT)
PREFILLED_SYRINGE | INTRAVENOUS | Status: AC
  Filled 2022-05-19: qty 10

## 2022-05-19 MED ORDER — BISACODYL 5 MG PO TBEC
10.0000 mg | DELAYED_RELEASE_TABLET | Freq: Every day | ORAL | Status: DC
  Administered 2022-05-20 – 2022-05-22 (×3): 10 mg via ORAL
  Filled 2022-05-19 (×3): qty 2

## 2022-05-19 MED ORDER — MIDAZOLAM HCL (PF) 10 MG/2ML IJ SOLN
INTRAMUSCULAR | Status: AC
  Filled 2022-05-19: qty 2

## 2022-05-19 MED ORDER — SODIUM CHLORIDE 0.9% FLUSH
3.0000 mL | Freq: Two times a day (BID) | INTRAVENOUS | Status: DC
  Administered 2022-05-20 – 2022-05-21 (×4): 3 mL via INTRAVENOUS

## 2022-05-19 MED ORDER — VENLAFAXINE HCL ER 75 MG PO CP24
150.0000 mg | ORAL_CAPSULE | Freq: Every day | ORAL | Status: DC
  Administered 2022-05-20 – 2022-05-25 (×6): 150 mg via ORAL
  Filled 2022-05-19: qty 1
  Filled 2022-05-19: qty 2
  Filled 2022-05-19 (×2): qty 1
  Filled 2022-05-19 (×2): qty 2

## 2022-05-19 MED ORDER — ACETAMINOPHEN 160 MG/5ML PO SOLN: 650.0000 mg | Freq: Once | ORAL | Status: AC

## 2022-05-19 MED ORDER — SODIUM CHLORIDE 0.9 % IV SOLN: 250.0000 mL | INTRAVENOUS | Status: DC

## 2022-05-19 MED ORDER — MORPHINE SULFATE (PF) 2 MG/ML IV SOLN
1.0000 mg | INTRAVENOUS | Status: DC | PRN
  Administered 2022-05-19 (×3): 4 mg via INTRAVENOUS
  Administered 2022-05-19: 2 mg via INTRAVENOUS
  Administered 2022-05-19 – 2022-05-20 (×6): 4 mg via INTRAVENOUS
  Administered 2022-05-20: 2 mg via INTRAVENOUS
  Administered 2022-05-20: 4 mg via INTRAVENOUS
  Administered 2022-05-20: 2 mg via INTRAVENOUS
  Administered 2022-05-20: 4 mg via INTRAVENOUS
  Filled 2022-05-19 (×5): qty 2
  Filled 2022-05-19: qty 1
  Filled 2022-05-19 (×5): qty 2
  Filled 2022-05-19: qty 1
  Filled 2022-05-19: qty 2
  Filled 2022-05-19: qty 1

## 2022-05-19 MED ORDER — ORAL CARE MOUTH RINSE: 15.0000 mL | OROMUCOSAL | Status: DC | PRN

## 2022-05-19 MED ORDER — ROCURONIUM BROMIDE 10 MG/ML (PF) SYRINGE
PREFILLED_SYRINGE | INTRAVENOUS | Status: AC
  Filled 2022-05-19: qty 10

## 2022-05-19 MED ORDER — THROMBIN (RECOMBINANT) 20000 UNITS EX SOLR
CUTANEOUS | Status: AC
  Filled 2022-05-19: qty 20000

## 2022-05-19 MED ORDER — METOPROLOL TARTRATE 5 MG/5ML IV SOLN: 2.5000 mg | INTRAVENOUS | Status: DC | PRN

## 2022-05-19 MED ORDER — CHLORHEXIDINE GLUCONATE CLOTH 2 % EX PADS
6.0000 | MEDICATED_PAD | Freq: Every day | CUTANEOUS | Status: DC
  Administered 2022-05-19 – 2022-05-21 (×3): 6 via TOPICAL

## 2022-05-19 MED ORDER — PHENYLEPHRINE 80 MCG/ML (10ML) SYRINGE FOR IV PUSH (FOR BLOOD PRESSURE SUPPORT)
PREFILLED_SYRINGE | INTRAVENOUS | Status: DC | PRN
  Administered 2022-05-19: 80 ug via INTRAVENOUS
  Administered 2022-05-19 (×2): 40 ug via INTRAVENOUS
  Administered 2022-05-19 (×2): 80 ug via INTRAVENOUS
  Administered 2022-05-19 (×2): 40 ug via INTRAVENOUS
  Administered 2022-05-19: 80 ug via INTRAVENOUS
  Administered 2022-05-19 (×2): 40 ug via INTRAVENOUS

## 2022-05-19 MED ORDER — CYCLOBENZAPRINE HCL 10 MG PO TABS
10.0000 mg | ORAL_TABLET | Freq: Two times a day (BID) | ORAL | Status: DC | PRN
  Administered 2022-05-19 – 2022-05-22 (×5): 10 mg via ORAL
  Filled 2022-05-19 (×6): qty 1

## 2022-05-19 MED ORDER — SODIUM CHLORIDE 0.9 % IV SOLN: INTRAVENOUS | Status: DC

## 2022-05-19 MED ORDER — PROPOFOL 10 MG/ML IV BOLUS
INTRAVENOUS | Status: DC | PRN
  Administered 2022-05-19: 20 mg via INTRAVENOUS

## 2022-05-19 MED ORDER — PROTAMINE SULFATE 10 MG/ML IV SOLN
INTRAVENOUS | Status: DC | PRN
  Administered 2022-05-19: 350 mg via INTRAVENOUS

## 2022-05-19 MED ORDER — METOPROLOL TARTRATE 12.5 MG HALF TABLET: 12.5000 mg | ORAL_TABLET | Freq: Two times a day (BID) | ORAL | Status: DC

## 2022-05-19 MED ORDER — ARTIFICIAL TEARS OPHTHALMIC OINT
TOPICAL_OINTMENT | OPHTHALMIC | Status: DC | PRN
  Administered 2022-05-19: 1 via OPHTHALMIC

## 2022-05-19 MED ORDER — METOPROLOL TARTRATE 25 MG/10 ML ORAL SUSPENSION: 12.5000 mg | Freq: Two times a day (BID) | ORAL | Status: DC

## 2022-05-19 MED ORDER — ASPIRIN 81 MG PO CHEW: 324.0000 mg | CHEWABLE_TABLET | Freq: Every day | ORAL | Status: DC

## 2022-05-19 MED ORDER — ALBUMIN HUMAN 5 % IV SOLN
250.0000 mL | INTRAVENOUS | Status: AC | PRN
  Administered 2022-05-19 (×4): 12.5 g via INTRAVENOUS
  Filled 2022-05-19 (×2): qty 250

## 2022-05-19 MED ORDER — NITROGLYCERIN IN D5W 200-5 MCG/ML-% IV SOLN: 0.0000 ug/min | INTRAVENOUS | Status: DC

## 2022-05-19 MED ORDER — HEPARIN SODIUM (PORCINE) 1000 UNIT/ML IJ SOLN
INTRAMUSCULAR | Status: AC
  Filled 2022-05-19: qty 1

## 2022-05-19 MED ORDER — ARTIFICIAL TEARS OPHTHALMIC OINT
TOPICAL_OINTMENT | OPHTHALMIC | Status: AC
  Filled 2022-05-19: qty 3.5

## 2022-05-19 MED ORDER — ~~LOC~~ CARDIAC SURGERY, PATIENT & FAMILY EDUCATION
Freq: Once | Status: DC
  Filled 2022-05-19: qty 1

## 2022-05-19 MED ORDER — LACTATED RINGERS IV SOLN: 500.0000 mL | Freq: Once | INTRAVENOUS | Status: DC | PRN

## 2022-05-19 MED ORDER — ACETAMINOPHEN 160 MG/5ML PO SOLN: 1000.0000 mg | Freq: Four times a day (QID) | ORAL | Status: AC

## 2022-05-19 MED ORDER — PHENYLEPHRINE HCL-NACL 20-0.9 MG/250ML-% IV SOLN
0.0000 ug/min | INTRAVENOUS | Status: DC
  Administered 2022-05-19: 40 ug/min via INTRAVENOUS
  Filled 2022-05-19: qty 250

## 2022-05-19 MED ORDER — PANTOPRAZOLE SODIUM 40 MG PO TBEC
40.0000 mg | DELAYED_RELEASE_TABLET | Freq: Every day | ORAL | Status: DC
  Administered 2022-05-21 – 2022-05-25 (×5): 40 mg via ORAL
  Filled 2022-05-19 (×5): qty 1

## 2022-05-19 MED ORDER — MIDAZOLAM HCL (PF) 5 MG/ML IJ SOLN
INTRAMUSCULAR | Status: DC | PRN
  Administered 2022-05-19: 2 mg via INTRAVENOUS
  Administered 2022-05-19 (×2): 1 mg via INTRAVENOUS
  Administered 2022-05-19: 2 mg via INTRAVENOUS
  Administered 2022-05-19: 1 mg via INTRAVENOUS
  Administered 2022-05-19: 2 mg via INTRAVENOUS
  Administered 2022-05-19: 1 mg via INTRAVENOUS

## 2022-05-19 MED ORDER — CHLORHEXIDINE GLUCONATE 4 % EX LIQD: 30.0000 mL | CUTANEOUS | Status: DC

## 2022-05-19 MED ORDER — ACETAMINOPHEN 650 MG RE SUPP
650.0000 mg | Freq: Once | RECTAL | Status: AC
  Administered 2022-05-19: 650 mg via RECTAL

## 2022-05-19 MED ORDER — ACETAMINOPHEN 500 MG PO TABS
1000.0000 mg | ORAL_TABLET | Freq: Four times a day (QID) | ORAL | Status: AC
  Administered 2022-05-19 – 2022-05-24 (×19): 1000 mg via ORAL
  Filled 2022-05-19 (×19): qty 2

## 2022-05-19 MED ORDER — ASPIRIN 325 MG PO TBEC
325.0000 mg | DELAYED_RELEASE_TABLET | Freq: Every day | ORAL | Status: DC
  Administered 2022-05-20 – 2022-05-22 (×3): 325 mg via ORAL
  Filled 2022-05-19 (×3): qty 1

## 2022-05-19 MED ORDER — HEMOSTATIC AGENTS (NO CHARGE) OPTIME
TOPICAL | Status: DC | PRN
  Administered 2022-05-19: 1 via TOPICAL

## 2022-05-19 SURGICAL SUPPLY — 115 items
ADH SKN CLS APL DERMABOND .7 (GAUZE/BANDAGES/DRESSINGS) ×2
ANTIFOG SOL W/FOAM PAD STRL (MISCELLANEOUS) ×2
BAG DECANTER FOR FLEXI CONT (MISCELLANEOUS) ×3 IMPLANT
BLADE CLIPPER SURG (BLADE) ×3 IMPLANT
BLADE STERNUM SYSTEM 6 (BLADE) ×3 IMPLANT
BLADE SURG 11 STRL SS (BLADE) IMPLANT
BNDG CMPR MED 10X6 ELC LF (GAUZE/BANDAGES/DRESSINGS) ×2
BNDG ELASTIC 4X5.8 VLCR STR LF (GAUZE/BANDAGES/DRESSINGS) ×3 IMPLANT
BNDG ELASTIC 6X10 VLCR STRL LF (GAUZE/BANDAGES/DRESSINGS) IMPLANT
BNDG ELASTIC 6X5.8 VLCR STR LF (GAUZE/BANDAGES/DRESSINGS) ×3 IMPLANT
BNDG GAUZE DERMACEA FLUFF 4 (GAUZE/BANDAGES/DRESSINGS) ×3 IMPLANT
BNDG GZE DERMACEA 4 6PLY (GAUZE/BANDAGES/DRESSINGS) ×2
CANISTER SUCT 3000ML PPV (MISCELLANEOUS) ×3 IMPLANT
CANNULA ARTERIAL VENT 3/8 20FR (CANNULA) IMPLANT
CANNULA MC2 2 STG 36/46 CONN (CANNULA) IMPLANT
CATH ROBINSON RED A/P 18FR (CATHETERS) ×6 IMPLANT
CATH THORACIC 28FR (CATHETERS) ×3 IMPLANT
CATH THORACIC 36FR (CATHETERS) ×3 IMPLANT
CATH THORACIC 36FR RT ANG (CATHETERS) ×3 IMPLANT
CLIP TI MEDIUM 24 (CLIP) IMPLANT
CLIP TI WIDE RED SMALL 24 (CLIP) IMPLANT
CONN Y 3/8X3/8X3/8  BEN (MISCELLANEOUS) ×4
CONN Y 3/8X3/8X3/8 BEN (MISCELLANEOUS) IMPLANT
CONTAINER PROTECT SURGISLUSH (MISCELLANEOUS) ×6 IMPLANT
DERMABOND ADVANCED .7 DNX12 (GAUZE/BANDAGES/DRESSINGS) IMPLANT
DRAPE CARDIOVASCULAR INCISE (DRAPES) ×2
DRAPE SRG 135X102X78XABS (DRAPES) ×3 IMPLANT
DRAPE WARM FLUID 44X44 (DRAPES) ×3 IMPLANT
DRSG COVADERM 4X14 (GAUZE/BANDAGES/DRESSINGS) ×3 IMPLANT
DRSG TEGADERM 4X4.75 (GAUZE/BANDAGES/DRESSINGS) IMPLANT
ELECT CAUTERY BLADE 6.4 (BLADE) ×3 IMPLANT
ELECT REM PT RETURN 9FT ADLT (ELECTROSURGICAL) ×4
ELECTRODE REM PT RTRN 9FT ADLT (ELECTROSURGICAL) ×6 IMPLANT
FELT TEFLON 1X6 (MISCELLANEOUS) ×6 IMPLANT
GAUZE 4X4 16PLY ~~LOC~~+RFID DBL (SPONGE) ×3 IMPLANT
GAUZE SPONGE 4X4 12PLY STRL (GAUZE/BANDAGES/DRESSINGS) ×6 IMPLANT
GAUZE SPONGE 4X4 12PLY STRL LF (GAUZE/BANDAGES/DRESSINGS) IMPLANT
GLOVE BIO SURGEON STRL SZ 6 (GLOVE) IMPLANT
GLOVE BIO SURGEON STRL SZ 6.5 (GLOVE) IMPLANT
GLOVE BIO SURGEON STRL SZ7 (GLOVE) IMPLANT
GLOVE BIO SURGEON STRL SZ7.5 (GLOVE) IMPLANT
GLOVE BIOGEL PI IND STRL 6 (GLOVE) IMPLANT
GLOVE BIOGEL PI IND STRL 6.5 (GLOVE) IMPLANT
GLOVE BIOGEL PI IND STRL 7.0 (GLOVE) IMPLANT
GLOVE BIOGEL PI IND STRL 9 (GLOVE) IMPLANT
GLOVE ECLIPSE 7.0 STRL STRAW (GLOVE) IMPLANT
GLOVE ORTHO TXT STRL SZ7.5 (GLOVE) IMPLANT
GLOVE SS BIOGEL STRL SZ 6.5 (GLOVE) IMPLANT
GLOVE SURG MICRO LTX SZ7 (GLOVE) ×6 IMPLANT
GLOVE SURG SS PI 7.5 STRL IVOR (GLOVE) IMPLANT
GOWN STRL REUS W/ TWL LRG LVL3 (GOWN DISPOSABLE) ×12 IMPLANT
GOWN STRL REUS W/ TWL XL LVL3 (GOWN DISPOSABLE) ×3 IMPLANT
GOWN STRL REUS W/TWL LRG LVL3 (GOWN DISPOSABLE) ×8
GOWN STRL REUS W/TWL XL LVL3 (GOWN DISPOSABLE) ×2
HEMOSTAT POWDER SURGIFOAM 1G (HEMOSTASIS) ×9 IMPLANT
HEMOSTAT SURGICEL 2X14 (HEMOSTASIS) ×3 IMPLANT
INSERT FOGARTY 61MM (MISCELLANEOUS) IMPLANT
INSERT FOGARTY XLG (MISCELLANEOUS) IMPLANT
KIT BASIN OR (CUSTOM PROCEDURE TRAY) ×3 IMPLANT
KIT CATH CPB BARTLE (MISCELLANEOUS) ×3 IMPLANT
KIT SUCTION CATH 14FR (SUCTIONS) ×3 IMPLANT
KIT TURNOVER KIT B (KITS) ×3 IMPLANT
KIT VASOVIEW HEMOPRO 2 VH 4000 (KITS) ×3 IMPLANT
KNIFE MICRO-UNI 3.5 30 DEG (BLADE) IMPLANT
MARKER SKIN DUAL TIP RULER LAB (MISCELLANEOUS) IMPLANT
NS IRRIG 1000ML POUR BTL (IV SOLUTION) ×15 IMPLANT
PACK E OPEN HEART (SUTURE) ×3 IMPLANT
PACK OPEN HEART (CUSTOM PROCEDURE TRAY) ×3 IMPLANT
PAD ARMBOARD 7.5X6 YLW CONV (MISCELLANEOUS) ×6 IMPLANT
PAD ELECT DEFIB RADIOL ZOLL (MISCELLANEOUS) ×3 IMPLANT
PENCIL BUTTON HOLSTER BLD 10FT (ELECTRODE) ×3 IMPLANT
POSITIONER HEAD DONUT 9IN (MISCELLANEOUS) ×3 IMPLANT
PUNCH AORTIC ROTATE 4.0MM (MISCELLANEOUS) IMPLANT
PUNCH AORTIC ROTATE 4.5MM 8IN (MISCELLANEOUS) ×3 IMPLANT
PUNCH AORTIC ROTATE 5MM 8IN (MISCELLANEOUS) IMPLANT
SET MPS 3-ND DEL (MISCELLANEOUS) IMPLANT
SOLUTION ANTFG W/FOAM PAD STRL (MISCELLANEOUS) IMPLANT
SPONGE INTESTINAL PEANUT (DISPOSABLE) IMPLANT
SPONGE T-LAP 18X18 ~~LOC~~+RFID (SPONGE) ×12 IMPLANT
SPONGE T-LAP 4X18 ~~LOC~~+RFID (SPONGE) ×6 IMPLANT
SUPPORT HEART JANKE-BARRON (MISCELLANEOUS) ×3 IMPLANT
SUT BONE WAX W31G (SUTURE) ×3 IMPLANT
SUT MNCRL AB 4-0 PS2 18 (SUTURE) IMPLANT
SUT PROLENE 3 0 SH DA (SUTURE) IMPLANT
SUT PROLENE 3 0 SH1 36 (SUTURE) ×3 IMPLANT
SUT PROLENE 4 0 RB 1 (SUTURE)
SUT PROLENE 4 0 SH DA (SUTURE) IMPLANT
SUT PROLENE 4-0 RB1 .5 CRCL 36 (SUTURE) IMPLANT
SUT PROLENE 5 0 C 1 36 (SUTURE) IMPLANT
SUT PROLENE 6 0 C 1 30 (SUTURE) IMPLANT
SUT PROLENE 7 0 BV 1 (SUTURE) IMPLANT
SUT PROLENE 7 0 BV1 MDA (SUTURE) ×3 IMPLANT
SUT PROLENE 8 0 BV175 6 (SUTURE) IMPLANT
SUT SILK  1 MH (SUTURE)
SUT SILK 1 MH (SUTURE) IMPLANT
SUT SILK 2 0 SH (SUTURE) IMPLANT
SUT STEEL STERNAL CCS#1 18IN (SUTURE) IMPLANT
SUT STEEL SZ 6 DBL 3X14 BALL (SUTURE) IMPLANT
SUT VIC AB 1 CTX 36 (SUTURE) ×4
SUT VIC AB 1 CTX36XBRD ANBCTR (SUTURE) ×6 IMPLANT
SUT VIC AB 2-0 CT1 27 (SUTURE) ×2
SUT VIC AB 2-0 CT1 TAPERPNT 27 (SUTURE) IMPLANT
SUT VIC AB 2-0 CTX 27 (SUTURE) IMPLANT
SUT VIC AB 3-0 SH 27 (SUTURE)
SUT VIC AB 3-0 SH 27X BRD (SUTURE) IMPLANT
SUT VIC AB 3-0 X1 27 (SUTURE) IMPLANT
SUT VICRYL 4-0 PS2 18IN ABS (SUTURE) IMPLANT
SYSTEM SAHARA CHEST DRAIN ATS (WOUND CARE) ×3 IMPLANT
TAPE CLOTH SURG 4X10 WHT LF (GAUZE/BANDAGES/DRESSINGS) IMPLANT
TOWEL GREEN STERILE (TOWEL DISPOSABLE) ×3 IMPLANT
TOWEL GREEN STERILE FF (TOWEL DISPOSABLE) ×3 IMPLANT
TRAY FOLEY SLVR 16FR TEMP STAT (SET/KITS/TRAYS/PACK) ×3 IMPLANT
TUBING LAP HI FLOW INSUFFLATIO (TUBING) ×3 IMPLANT
UNDERPAD 30X36 HEAVY ABSORB (UNDERPADS AND DIAPERS) ×3 IMPLANT
WATER STERILE IRR 1000ML POUR (IV SOLUTION) ×6 IMPLANT

## 2022-05-19 NOTE — Discharge Summary (Signed)
Physician Discharge Summary  Patient ID: Victor [name] MRN: [id number] DOB/AGE: [birth year] 67 y.o.  Admit date: 05/19/2022 Discharge date: 05/25/2022  Admission Diagnoses:  Multivessel coronary artery disease Angina pectoris Chronic pain Spinal stenosis Degenerative joint disease Obesity Fibromyalgia History of chronic kidney disease Hypertension Dyslipidemia  Discharge Diagnoses:   Multivessel coronary artery disease S/P CABG x 5 Post-operative atrial fibrillation Expected acute blood loss anemia Angina pectoris Chronic pain Spinal stenosis Degenerative joint disease Obesity Fibromyalgia History of chronic kidney disease Hypertension Dyslipidemia   Discharged Condition: stable  PCP is Antony Contras, MD Referring Provider is Nahser, Wonda Cheng, MD  History of Present Illness:   The patient is a 67 year old gentleman with a history of hyperlipidemia, hypertension, prediabetes, 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  He has undergone bilateral shoulder replacements and right knee replacement in the past.  He has chronic left shoulder dislocation and requires surgical revision of his shoulder replacement.  He is in chronic pain and takes Dilaudid for that.  He underwent a cardiac calcium scoring CT on 11/28/2021 showing a coronary calcium score of 2535 which was in the 98th percentile.  The ascending aorta was mildly dilated at 4.0 cm.  He subsequently underwent a cardiac PET scan on 01/24/2022 which was an intermediate risk study consistent with ischemia.  There is a small defect in the mid to basal inferior wall that was reversible suggesting RCA territory ischemia.  Left ventricular ejection fraction was 52%.  He subsequent underwent cardiac catheterization on 02/03/2022 showing occlusion of the proximal RCA.  The distal vessel filled by  collaterals from the left.  The LAD had 70% proximal to mid vessel stenosis.  There is a large first diagonal with 90% ostial stenosis.  Left circumflex had 60% proximal stenosis and then 80% mid vessel stenosis.  There were 2 marginal branches.  The first had about 80% stenosis.  Left ventricular ejection fraction was 55 to 65% with normal LVEDP.   The patient lives with his wife.  He reports some vague chest discomfort that is not necessarily associated with activity.  He does report exertional fatigue and tiredness.  He is not very active due to his orthopedic problems.  He has had some shortness of breath with activity.  This 67 year old gentleman has severe multivessel coronary disease with evidence of ischemia in the RCA territory on cardiac PET scan. He is in need of multiple orthopedic surgeries and I think it would be best to proceed with coronary bypass graft surgery prior to that to minimize his cardiac risk. I reviewed the catheterization and echo images with him and his wife and answered all their questions. I discussed the operative procedure with the patient and his wife including alternatives, benefits and risks; including but not limited to bleeding, blood transfusion, infection, stroke, myocardial infarction, graft failure, heart block requiring a permanent pacemaker, organ dysfunction, and death. [name] understands and agrees to proceed   Hospital Course: Mr. Yarger was admitted for elective surgery on 05/19/2022 and taken to the operating room where CABG x 5 was accomplished without complication.  Left internal mammary artery was grafted to the left anterior descending coronary artery.  A saphenous vein graft was placed to the first diagonal.  Sequential saphenous vein grafts were placed to the first and second obtuse marginals and a separate saphenous vein graft was placed to the PDA.  Following the procedure, he separated from cardiopulmonary bypass without difficulty.  He  was transferred to the surgical ICU in stable condition.  Postoperative hospital course:  The patient has done well overall.  He was weaned off the ventilator using standard post cardiac surgical protocols without difficulty.  He has remained hemodynamically stable.  He is noted to have a postoperative expected volume excess and was started on a course of diuretics.  All routine lines, monitors and drainage devices were discontinued in the standard fashion.  He has some chronic pain issues and PT consultation was obtained to assist with postoperative rehabilitation.  He is noted to have a expected postoperative acute blood loss anemia which is stable and not at transfusion threshold.  Renal function has remained within normal limits.  He developed atrial fibrillation while still in the ICU and was loaded with IV amiodarone resulting in conversion back to SR. The amiodarone was transitioned to the oral form and continued after transfer to 4E. His usual pain medication regimen was resumed and he reported adequate control.  Pacer wires were removed routinely on post-op day 4.  He made an excellent effort to regain independence with mobility and was walking independently by the 3rd post-op day. He was transferred to 4E Progressive Care. Diet and activity were advanced and well tolerated. He had another brief episode of atrial fibrillation but was maintaining SR on the oral amiodarone at the time of discharge. Incisions were healing with no sign of complication.    Consults: None  Significant Diagnostic Studies:    CLINICAL DATA:  Postop check   EXAM: CHEST - 2 VIEW   COMPARISON:  Chest radiograph 05/21/2022   FINDINGS: Interval removal of the right sided central venous catheter. Status post median sternotomy. Postsurgical changes from bilateral shoulder arthroplasties.   Unchanged small bilateral pleural effusions. No pneumothorax. No new focal airspace opacity. Bibasilar atelectasis.  Unchanged cardiac and mediastinal contours. No radiographically apparent displaced rib fractures. Visualized upper abdomen is unremarkable. Degenerative changes of the bilateral AC joints. Vertebral body heights are maintained. Partially visualized lumbar spinal fusion hardware in place.   IMPRESSION: 1. Interval removal of the right-sided central venous catheter sheath. 2. Unchanged small bilateral pleural effusions and likely superimposed bibasilar atelectasis.     Electronically Signed   By: Marin Roberts M.D.   On: 05/24/2022 07:54    Treatments:  CARDIOVASCULAR SURGERY OPERATIVE NOTE   05/19/2022   Surgeon:  Gaye Pollack, MD   First Assistant: Enid Cutter,  PA-C:   An experienced assistant was required given the complexity of this surgery and the standard of surgical care. The assistant was needed for endoscopic vein harvesting, exposure, dissection, suctioning, retraction of delicate tissues and sutures, instrument exchange and for overall help during this procedure.     Preoperative Diagnosis:  Severe multi-vessel coronary artery disease     Postoperative Diagnosis:  Same     Procedure:   Median Sternotomy Extracorporeal circulation 3.   Coronary artery bypass grafting x 5   Left internal mammary artery graft to the LAD SVG to diagonal SVG to OM1 and OM2 SVG to PDA 4.   Endoscopic vein harvest from the left leg     Anesthesia:  General Endotracheal  Discharge Exam: Blood pressure 127/76, pulse 77, temperature 98.2 F (36.8 C), temperature source Oral, resp. rate 17, height 5\' 8"  (1.727 m), weight 103.4 kg, SpO2 99 %.  General appearance: alert, cooperative, and mild distress Neurologic: intact Heart:  Stable SR since  yesterday morning.   Lungs: normal WOB. Sats stable on RA CXR is OK. Abdomen: soft, no tenderness Extremities: left LE EVH incision intact and dry. Minimal LE edema  Expected bruising left thigh. Wound: The sternal incision well  approximated and dry  Disposition: Discharged to home with home health  Discharge Instructions     Amb Referral to Cardiac Rehabilitation   Complete by: As directed    Diagnosis: CABG   CABG X ___: 5   After initial evaluation and assessments completed: Virtual Based Care may be provided alone or in conjunction with Phase 2 Cardiac Rehab based on patient barriers.: Yes   Intensive Cardiac Rehabilitation (ICR) [place] location only OR Traditional Cardiac Rehabilitation (TCR) *If criteria for ICR are not met will enroll in TCR [facility] only): Yes      Allergies as of 05/25/2022       Reactions   Tetracyclines & Related Other (See Comments)   " makes me angry"   Amoxicillin Diarrhea, Nausea And Vomiting   Crestor [rosuvastatin]    Joint pain    Doxycycline Other (See Comments)   Upset stomach   Niacin And Related Itching        Medication List     STOP taking these medications    amLODipine 5 MG tablet Commonly known as: NORVASC   atenolol 100 MG tablet Commonly known as: TENORMIN   ibuprofen 200 MG tablet Commonly known as: ADVIL   neomycin-bacitracin-polymyxin [id number] ointment Commonly known as: NEOSPORIN   olmesartan 40 MG tablet Commonly known as: BENICAR       TAKE these medications    acetaminophen 500 MG tablet Commonly known as: TYLENOL Take 500-1,000 mg by mouth every 8 (eight) hours as needed for moderate pain.   allopurinol 300 MG tablet Commonly known as: ZYLOPRIM TAKE 1 AND 1/2 TABLETS BY MOUTH DAILY   ALPRAZolam 1 MG tablet Commonly known as: XANAX Take 1 mg by mouth 2 (two) times daily as needed for anxiety.   amiodarone 200 MG tablet Commonly known as: PACERONE Take 2 tablets (400 mg total) by mouth 2 (two) times daily. For 7 days then decrease the dose to 1 tablet (200mg ) by mouth twice daily.   aspirin EC 325 MG tablet Take 1 tablet (325 mg total) by mouth daily. What changed:  medication strength how much to take additional  instructions   b complex vitamins tablet Take 1 tablet by mouth in the morning.   Biotin 5000 MCG Caps Take 5,000 mcg by mouth in the morning.   CALCIUM 600 PO Take 600 mg by mouth in the morning.   carboxymethylcellulose 0.5 % Soln Commonly known as: REFRESH PLUS Place 1 drop into both eyes 2 (two) times daily as needed (Dry eyes).   colchicine 0.6 MG tablet TAKE 1 TABLET (0.6 MG TOTAL) BY MOUTH 2 (TWO) TIMES DAILY AS NEEDED.   cyclobenzaprine 10 MG tablet Commonly known as: FLEXERIL Take 10 mg by mouth 2 (two) times daily as needed for muscle spasms.   fish oil-omega-3 fatty acids 1000 MG capsule Take 1 g by mouth in the morning and at bedtime.   fluticasone 50 MCG/ACT nasal spray Commonly known as: FLONASE Place 1 spray into both nostrils 2 (two) times daily. For congestion   GINSENG PO Take 400 mg by mouth in the morning.   guaiFENesin 600 MG 12 hr tablet Commonly known as: MUCINEX Take 600 mg by mouth at bedtime as needed (Congestion).   hydrocortisone cream 1 % Apply  1 application topically daily as needed for itching.   HYDROmorphone 4 MG tablet Commonly known as: DILAUDID Take 4 mg by mouth every 8 (eight) hours as needed for severe pain.   loratadine 10 MG tablet Commonly known as: CLARITIN Take 10 mg by mouth daily.   magnesium gluconate 500 MG tablet Commonly known as: MAGONATE Take 500 mg by mouth in the morning.   Melatonin 10 MG Tabs Take 10 mg by mouth at bedtime as needed (sleep).   METAMUCIL FIBER PO Take 1 Scoop by mouth daily in the afternoon.   metoprolol tartrate 25 MG tablet Commonly known as: LOPRESSOR Take 1 tablet (25 mg total) by mouth 2 (two) times daily.   MSM 1000 MG Caps Take 1,000 mg by mouth in the morning.   multivitamin with minerals tablet Take 1 tablet by mouth daily.   nitroGLYCERIN 0.4 MG SL tablet Commonly known as: NITROSTAT Dissolve 1 tablet under the tongue every 5 minutes as needed for chest pain. Max of 3  doses, then 911.   NONFORMULARY OR COMPOUNDED ITEM Apply 1 g topically daily as needed (foot discomfort/pain.). Doxepin 5%, Amantadine 3%, Pentoxifylline 3%, Lamotrigine 2%, Sertraline 3%, Gabapentin 6% PCCA Lipoderm Base Cream   omeprazole 40 MG capsule Commonly known as: PRILOSEC Take 40 mg by mouth in the morning.   Repatha SureClick [id number] MG/ML Soaj Generic drug: Evolocumab Inject 140 mg into the skin every 14 (fourteen) days.   selenium 200 MCG Tabs tablet Take 200 mcg by mouth in the morning.   sildenafil 100 MG tablet Commonly known as: VIAGRA Take 100 mg by mouth daily as needed for erectile dysfunction. Per patient taking 1/2 tablet (50 mg) as needed   silodosin 8 MG Caps capsule Commonly known as: RAPAFLO Take 8 mg by mouth at bedtime.   sodium chloride 0.65 % Soln nasal spray Commonly known as: OCEAN Place 1 spray into both nostrils as needed for congestion.   testosterone cypionate 200 MG/ML injection Commonly known as: DEPOTESTOSTERONE CYPIONATE Inject 100 mg into the muscle every 14 (fourteen) days. 0.5 mL   Turmeric 500 MG Caps Take 500 mg by mouth in the morning.   venlafaxine XR 150 MG 24 hr capsule Commonly known as: EFFEXOR-XR Take 150 mg by mouth daily with breakfast.   vitamin D3 25 MCG (1000 UT) tablet Generic drug: Cholecalciferol Take 1,000 Units by mouth in the morning.   vitamin E 180 MG (400 UNITS) capsule Take 400 Units by mouth in the morning.        Follow-up Information     Gaye Pollack, MD. Go on 06/14/2022.   Specialty: Cardiothoracic Surgery Why: Your appointment time is 11am Please obtain a chest x-ray by [facility] located at [address] 1 hour before the appointment Contact information: [address] [place] [address] [facility] Follow up on 06/01/2022.   Specialty: Cardiothoracic Surgery Why: Your apptointment time for suture  removal is  10am. Contact information: [place], [place]        Nahser, Wonda Cheng, MD. Go on 06/21/2022.   Specialty: Cardiology Why: Your follow up appointment is at 1:30pm. Contact information: [address] [place] [address] [place]. Follow up.   Why: Latricia Heft) HHPT/OT arranged- they will  contact you to schedule post discharge Contact information: [place] [address] [phone]                 The patient has been discharged on:   1.Beta Blocker:  Yes [ x]                              No   [   ]                              If No, reason:  2.Ace Inhibitor/ARB: Yes [   ]                                     No  [  x  ]                                     If No, reason: borderline renal function  3.Statin:   Yes [   ]                  No  [ x  ]                  If No, reason: Intolerance  4.S[place]a [place]:  Yes  [ x ]                  No   [   ]                  If No, reason:  5. ACS on Admission?  No  P2Y12 Inhibitor:   Yes  [   ]                               No  [ x ]    Signed: Antony Odea, PA-C 05/25/2022, 8:08 AM

## 2022-05-19 NOTE — Procedures (Signed)
Extubation Procedure Note  Patient Details:   Name: Victor [name] DOB: [birth year] MRN: [id number]   Airway Documentation:    Vent end date: 05/19/22 Vent end time: 1720   Evaluation  O2 sats: stable throughout Complications: No apparent complications Patient did tolerate procedure well. Bilateral Breath Sounds: Clear, Diminished   Yes  Pt extubated per rapid wean protocol. NIF -28 VC 947ml. Pt suctioned via ETT and orally prior, positive cuff leak. Upon extubation pt placed on 3L [place], able to speak name, give a good cough and no stridor heard at this time. RT will continue to monitor and be available as needed.   Sharla Kidney 05/19/2022, 5:25 PM

## 2022-05-19 NOTE — Progress Notes (Signed)
Patient ID: Victor [name], male   DOB: May 01, 1955, 67 y.o.   MRN: [id number]  TCTS Evening Rounds:   Hemodynamically stable  CI = 1.8  Has started to wake up on vent.   Urine output good  CT output low  CBC    Component Value Date/Time   WBC 8.9 05/19/2022 1314   RBC 3.35 (L) 05/19/2022 1314   HGB 10.6 (L) 05/19/2022 1314   HGB 17.4 01/31/2022 1513   HGB 18.2 (H) 06/17/2012 1348   HCT 32.0 (L) 05/19/2022 1314   HCT 51.7 (H) 01/31/2022 1513   HCT 53.5 (H) 06/17/2012 1348   PLT 146 (L) 05/19/2022 1314   PLT 225 01/31/2022 1513   MCV 95.5 05/19/2022 1314   MCV 94 01/31/2022 1513   MCV 90.9 06/17/2012 1348   MCH 31.6 05/19/2022 1314   MCHC 33.1 05/19/2022 1314   RDW 14.3 05/19/2022 1314   RDW 13.9 01/31/2022 1513   RDW 14.4 06/17/2012 1348   LYMPHSABS 1,252 11/24/2020 1118   LYMPHSABS 2.4 06/17/2012 1348   MONOABS 0.7 05/20/2020 1037   MONOABS 0.6 06/17/2012 1348   EOSABS 143 11/24/2020 1118   EOSABS 0.1 06/17/2012 1348   BASOSABS 19 11/24/2020 1118   BASOSABS 0.0 06/17/2012 1348     BMET    Component Value Date/Time   NA 138 05/19/2022 1203   NA 133 (L) 02/02/2022 1252   NA 138 06/17/2012 1349   K 4.7 05/19/2022 1203   K 4.2 06/17/2012 1349   CL 101 05/19/2022 1200   CL 106 06/17/2012 1349   CO2 20 (L) 05/17/2022 1230   CO2 21 (L) 06/17/2012 1349   GLUCOSE 106 (H) 05/19/2022 1200   GLUCOSE 114 (H) 06/17/2012 1349   BUN 12 05/19/2022 1200   BUN 19 02/02/2022 1252   BUN 11.1 06/17/2012 1349   CREATININE 0.80 05/19/2022 1200   CREATININE 1.0 06/17/2012 1349   CALCIUM 8.9 05/17/2022 1230   CALCIUM 9.6 06/17/2012 1349   EGFR 80 02/02/2022 1252   GFRNONAA >60 05/17/2022 1230     A/P:  Stable postop course. Continue current plans

## 2022-05-19 NOTE — Anesthesia Postprocedure Evaluation (Signed)
Anesthesia Post Note  Patient: Victor [name]  Procedure(s) Performed: CORONARY ARTERY BYPASS GRAFTING (CABG) X FIVE BYPASSES USING OPEN LEFT INTERNAL MAMMARY ARTERY AND ENDOSCOPIC LEFT GREATER SAPHENOUS VEIN HARVEST (Chest) TRANSESOPHAGEAL ECHOCARDIOGRAM     Patient location during evaluation: SICU Anesthesia Type: General Level of consciousness: sedated and patient remains intubated per anesthesia plan Pain management: pain level controlled Vital Signs Assessment: post-procedure vital signs reviewed and stable Respiratory status: patient remains intubated per anesthesia plan and patient on ventilator - see flowsheet for VS (beginning wean from vent) Cardiovascular status: stable and blood pressure returned to baseline Postop Assessment: no apparent nausea or vomiting Anesthetic complications: no   No notable events documented.  Last Vitals:  Vitals:   05/19/22 1600 05/19/22 1615  BP:    Pulse: 80   Resp: 15 16  Temp: 36.6 C (!) 36.4 C  SpO2: 100%     Last Pain:  Vitals:   05/19/22 0605  TempSrc: Oral  PainSc: 5                  Syler Norcia,E. David Towson

## 2022-05-19 NOTE — Anesthesia Procedure Notes (Signed)
Procedure Name: Intubation Date/Time: 05/19/2022 7:57 AM  Performed by: Carolan Clines, CRNAPre-anesthesia Checklist: Patient identified, Emergency Drugs available, Suction available and Patient being monitored Patient Re-evaluated:Patient Re-evaluated prior to induction Oxygen Delivery Method: Circle System Utilized Preoxygenation: Pre-oxygenation with 100% oxygen Induction Type: IV induction Ventilation: Mask ventilation without difficulty and Oral airway inserted - appropriate to patient size Laryngoscope Size: Mac and 4 Grade View: Grade I Tube type: Oral Tube size: 8.0 mm Number of attempts: 1 Airway Equipment and Method: Stylet and Oral airway Placement Confirmation: ETT inserted through vocal cords under direct vision, positive ETCO2 and breath sounds checked- equal and bilateral Secured at: 22 cm Tube secured with: Tape Dental Injury: Teeth and Oropharynx as per pre-operative assessment

## 2022-05-19 NOTE — Op Note (Signed)
CARDIOVASCULAR SURGERY OPERATIVE NOTE  05/19/2022  Surgeon:  Gaye Pollack, MD  First Assistant: Enid Cutter,  PA-C:   An experienced assistant was required given the complexity of this surgery and the standard of surgical care. The assistant was needed for endoscopic vein harvesting, exposure, dissection, suctioning, retraction of delicate tissues and sutures, instrument exchange and for overall help during this procedure.   Preoperative Diagnosis:  Severe multi-vessel coronary artery disease   Postoperative Diagnosis:  Same   Procedure:  Median Sternotomy Extracorporeal circulation 3.   Coronary artery bypass grafting x 5  Left internal mammary artery graft to the LAD SVG to diagonal SVG to OM1 and OM2 SVG to PDA 4.   Endoscopic vein harvest from the left leg   Anesthesia:  General Endotracheal   Clinical History/Surgical Indication:  This 67 year old gentleman has severe multivessel coronary disease with evidence of ischemia in the RCA territory on cardiac PET scan. He is in need of multiple orthopedic surgeries and I think it would be best to proceed with coronary bypass graft surgery prior to that to minimize his cardiac risk. I reviewed the catheterization and echo images with him and his wife and answered all their questions. I discussed the operative procedure with the patient and his wife including alternatives, benefits and risks; including but not limited to bleeding, blood transfusion, infection, stroke, myocardial infarction, graft failure, heart block requiring a permanent pacemaker, organ dysfunction, and death. Shenouda Motola [place] understands and agrees to proceed.   Preparation:  The patient was seen in the preoperative holding area and the correct patient, correct operation were confirmed with the patient after reviewing the medical record and catheterization.  The consent was signed by me. Preoperative antibiotics were given. A pulmonary arterial line and radial arterial line were placed by the anesthesia team. The patient was taken back to the operating room and positioned supine on the operating room table. After being placed under general endotracheal anesthesia by the anesthesia team a foley catheter was placed. The neck, chest, abdomen, and both legs were prepped with betadine soap and solution and draped in the usual sterile manner. A surgical time-out was taken and the correct patient and operative procedure were confirmed with the nursing and anesthesia staff.   Cardiopulmonary Bypass:  A median sternotomy was performed. The pericardium was opened in the midline. Right ventricular function appeared normal. The ascending aorta was of normal size and had no palpable plaque. There were no contraindications to aortic cannulation or cross-clamping. The patient was fully systemically heparinized and the ACT was maintained > 400 sec. The proximal aortic arch was cannulated with a 20 F aortic cannula for arterial inflow. Venous cannulation was performed via the right atrial appendage using a two-staged venous cannula. An antegrade cardioplegia/vent cannula was inserted into the mid-ascending aorta. Aortic occlusion was performed with a single cross-clamp. Systemic cooling to 32 degrees Centigrade and topical cooling of the heart with iced saline were used. Hyperkalemic antegrade cold blood cardioplegia was used to induce diastolic arrest and was then given at about 20 minute intervals throughout the period of arrest to maintain myocardial temperature at or below 10 degrees centigrade. A temperature probe was inserted into the interventricular septum and an insulating pad was placed in the pericardium.   Left internal mammary artery harvest:  The left side of the sternum was retracted using the Rultract retractor. The left internal mammary artery was harvested as  a pedicle graft. All side branches were clipped. It was a medium-sized  vessel of good quality with excellent blood flow. It was ligated distally and divided. It was sprayed with topical papaverine solution to prevent vasospasm.   Endoscopic vein harvest:  The left greater saphenous vein was harvested endoscopically through a 2 cm incision medial to the left knee. It was harvested from the upper thigh to below the knee. It was a medium-sized vein of good quality. The side branches were all ligated with 4-0 silk ties.    Coronary arteries:  The coronary arteries were examined.  LAD:  large vessel that was diffusely diseased. Diagonal was heavily diseased proximally. LCX:  OM1 and OM2 moderate sized and diffusely diseased. RCA:  PDA diffusely diseased. Small PL branches.   Grafts:  LIMA to the LAD: 1.75 mm distally. It was sewn end to side using 8-0 prolene continuous suture. SVG to diagonal:  1.6 mm. It was sewn end to side using 7-0 prolene continuous suture. Sequential SVG to OM1:  1.6 mm. It was sewn sequential side to side using 7-0 prolene continuous suture. Sequential SVG to OM2:  1.6 mm. It was sewn sequential end to side using 7-0 prolene continuous suture. SVG to PDA:  1.75 mm. It was sewn end to side using 7-0 prolene continuous suture.  The proximal vein graft anastomoses were performed to the mid-ascending aorta using continuous 6-0 prolene suture. Graft markers were placed around the proximal anastomoses.   Completion:  The patient was rewarmed to 37 degrees Centigrade. The clamp was removed from the LIMA pedicle and there was rapid warming of the septum and return of ventricular fibrillation. The crossclamp was removed with a time of 93 minutes. There was spontaneous return of sinus rhythm. The distal and proximal anastomoses were checked for hemostasis. The position of the grafts was satisfactory. Two temporary epicardial pacing wires were placed on the right atrium and two  on the right ventricle. The patient was weaned from CPB without difficulty on no inotropes. CPB time was 110 minutes. Cardiac output was 5 LPM. TEE showed normal LV systolic function. Heparin was fully reversed with protamine and the aortic and venous cannulas removed. Hemostasis was achieved. Mediastinal and left pleural drainage tubes were placed. The sternum was closed with double #6 stainless steel wires. The fascia was closed with continuous # 1 vicryl suture. The subcutaneous tissue was closed with 2-0 vicryl continuous suture. The skin was closed with 3-0 vicryl subcuticular suture. All sponge, needle, and instrument counts were reported correct at the end of the case. Dry sterile dressings were placed over the incisions and around the chest tubes which were connected to pleurevac suction. The patient was then transported to the surgical intensive care unit in stable condition.

## 2022-05-19 NOTE — Hospital Course (Signed)
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  He has undergone bilateral shoulder replacements and right knee replacement in the past.  He has chronic left shoulder dislocation and requires surgical revision of his shoulder replacement.  He is in chronic pain and takes Dilaudid for that.  He underwent a cardiac calcium scoring CT on 11/28/2021 showing a coronary calcium score of 2535 which was in the 98th percentile.  The ascending aorta was mildly dilated at 4.0 cm.  He subsequently underwent a cardiac PET scan on 01/24/2022 which was an intermediate risk study consistent with ischemia.  There is a small defect in the mid to basal inferior wall that was reversible suggesting RCA territory ischemia.  Left ventricular ejection fraction was 52%.  He subsequent underwent cardiac catheterization on 02/03/2022 showing occlusion of the proximal RCA.  The distal vessel filled by collaterals from the left.  The LAD had 70% proximal to mid vessel stenosis.  There is a large first diagonal with 90% ostial stenosis.  Left circumflex had 60% proximal stenosis and then 80% mid vessel stenosis.  There were 2 marginal branches.  The first had about 80% stenosis.  Left ventricular ejection fraction was 55 to 65% with normal LVEDP.   The patient lives with his wife.  He reports some vague chest discomfort that is not necessarily associated with activity.  He does report exertional fatigue and tiredness.  He is not very active due to his orthopedic problems.  He has had some shortness of breath with activity.  This 67 year old gentleman has severe multivessel coronary disease with evidence of ischemia in  the RCA territory on cardiac PET scan. He is in need of multiple orthopedic surgeries and I think it would be best to proceed with coronary bypass graft surgery prior to that to minimize his cardiac risk. I reviewed the catheterization and echo images with him and his wife and answered all their questions. I discussed the operative procedure with the patient and his wife including alternatives, benefits and risks; including but not limited to bleeding, blood transfusion, infection, stroke, myocardial infarction, graft failure, heart block requiring a permanent pacemaker, organ dysfunction, and death. Morgun Glenny [place] understands and agrees to proceed   Hospital Course: Mr. Berkshire was admitted for elective surgery on 05/19/2022 and taken to the operating room where CABG x 5 was accomplished without complication.  Left internal mammary artery was grafted to the left anterior descending coronary artery.  A saphenous vein graft was placed to the first diagonal.  Sequential saphenous vein grafts were placed to the first and second obtuse marginals and a separate saphenous vein graft was placed to the PDA.  Following the procedure, he separated from cardiopulmonary bypass without difficulty.  He was transferred to the surgical ICU in stable condition.  Postoperative hospital course:  The patient has done well overall.  He was weaned off the ventilator using standard post cardiac surgical protocols without difficulty.  He has remained hemodynamically stable.  He is noted to have a postoperative expected volume excess and was started on a course of diuretics.  All routine lines, monitors and drainage devices were discontinued in the standard fashion.  He has some chronic pain issues  and PT consultation was obtained to assist with postoperative rehabilitation.  He is noted to have a expected postoperative acute blood loss anemia which is stable and not in transfusion threshold.  Renal function has remained within  normal limits.

## 2022-05-19 NOTE — Anesthesia Procedure Notes (Signed)
Central Venous Catheter Insertion Performed by: Annye Asa, MD, anesthesiologist Start/End3/22/2024 6:32 AM, 05/19/2022 6:49 AM Patient location: Pre-op. Preanesthetic checklist: patient identified, IV checked, risks and benefits discussed, surgical consent, monitors and equipment checked, pre-op evaluation, timeout performed and anesthesia consent Position: supine Lidocaine 1% used for infiltration and patient sedated Hand hygiene performed , maximum sterile barriers used  and Seldinger technique used Catheter size: 7.5 Fr PA cath was placed.Sheath introducer Swan type:thermodilution Procedure performed using ultrasound guided technique. Ultrasound Notes:anatomy identified, needle tip was noted to be adjacent to the nerve/plexus identified, no ultrasound evidence of intravascular and/or intraneural injection and image(s) printed for medical record Attempts: 1 Following insertion, line sutured, dressing applied and Biopatch. Post procedure assessment: free fluid flow, blood return through all ports and no air  Patient tolerated the procedure well with no immediate complications. Additional procedure comments: PA catheter:  Routine monitors. Timeout, sterile prep, drape, FBP R neck.  Supine position.  1% Lido local, finder and trocar RIJ 1st pass with US guidance.  Cordis placed over J wire. PA catheter in easily.  Sterile dressing applied.  Patient tolerated well, VSS.  Jenita Seashore, MD.

## 2022-05-19 NOTE — Interval H&P Note (Signed)
History and Physical Interval Note:  05/19/2022 7:03 AM  [place]  has presented today for surgery, with the diagnosis of CAD.  The various methods of treatment have been discussed with the patient and family. After consideration of risks, benefits and other options for treatment, the patient has consented to  Procedure(s): CORONARY ARTERY BYPASS GRAFTING (CABG) (N/A) TRANSESOPHAGEAL ECHOCARDIOGRAM (N/A) as a surgical intervention.  The patient's history has been reviewed, patient examined, no change in status, stable for surgery.  I have reviewed the patient's chart and labs.  Questions were answered to the patient's satisfaction.     Gaye Pollack

## 2022-05-19 NOTE — Brief Op Note (Signed)
05/19/2022  11:15 AM  PATIENT:  Victor [name]  67 y.o. male  PRE-OPERATIVE DIAGNOSIS:  CORONARY ARTERY DISEASE  POST-OPERATIVE DIAGNOSIS:  CORONARY ARTERY DISEASE  PROCEDURE:   CORONARY ARTERY BYPASS GRAFTING  X 5BYPASSES USING OPEN LEFT INTERNAL MAMMARY ARTERY AND ENDOSCOPIC LEFT GREATER SAPHENOUS VEIN HARVEST   LIMA-LAD SVG-D1 SVG-OM1-OM2 (sequential) SVG-PDA  Vein harvest time: 51min Vein prep time: 39min   TRANSESOPHAGEAL ECHOCARDIOGRAM  SURGEON: Gaye Pollack, MD   PHYSICIAN ASSISTANT: Shirlyn Savin  ASSISTANTS: Dineen Kid, RN, RN First Assistant   ANESTHESIA:   general  EBL:  198ml  BLOOD ADMINISTERED:none  DRAINS: Mediastinal and left pleural drains  LOCAL MEDICATIONS USED:  NONE  COUNTS: Correct  DICTATION: .Dragon Dictation  PLAN OF CARE: Admit to inpatient   PATIENT DISPOSITION:  ICU - intubated and hemodynamically stable.   Delay start of Pharmacological VTE agent (>24hrs) due to surgical blood loss or risk of bleeding: yes

## 2022-05-19 NOTE — Transfer of Care (Signed)
Immediate Anesthesia Transfer of Care Note  Patient: Victor [name]  Procedure(s) Performed: CORONARY ARTERY BYPASS GRAFTING (CABG) X FIVE BYPASSES USING OPEN LEFT INTERNAL MAMMARY ARTERY AND ENDOSCOPIC LEFT GREATER SAPHENOUS VEIN HARVEST (Chest) TRANSESOPHAGEAL ECHOCARDIOGRAM  Patient Location: ICU  Anesthesia Type:General  Level of Consciousness: sedated and Patient remains intubated per anesthesia plan  Airway & Oxygen Therapy: Patient remains intubated per anesthesia plan and Patient placed on Ventilator (see vital sign flow sheet for setting)  Post-op Assessment: Report given to RN and Post -op Vital signs reviewed and stable  Post vital signs: Reviewed and stable  Last Vitals:  Vitals Value Taken Time  BP    Temp 36.1 C 05/19/22 1303  Pulse 80 05/19/22 1303  Resp 18 05/19/22 1303  SpO2 95 % 05/19/22 1303  Vitals shown include unvalidated device data.  Last Pain:  Vitals:   05/19/22 0605  TempSrc: Oral  PainSc: 5          Complications: No notable events documented.

## 2022-05-19 NOTE — Discharge Instructions (Signed)

## 2022-05-19 NOTE — Anesthesia Procedure Notes (Signed)
Arterial Line Insertion Start/End3/22/2024 6:50 AM Performed by: Carolan Clines, CRNA, CRNA  Patient location: Pre-op. Preanesthetic checklist: patient identified, IV checked, site marked, risks and benefits discussed, surgical consent, monitors and equipment checked, pre-op evaluation, timeout performed and anesthesia consent Lidocaine 1% used for infiltration Left, radial was placed Catheter size: 20 G Hand hygiene performed  and maximum sterile barriers used   Attempts: 1 Procedure performed without using ultrasound guided technique. Following insertion, dressing applied and Biopatch. Post procedure assessment: normal and unchanged  Patient tolerated the procedure well with no immediate complications.

## 2022-05-19 NOTE — Progress Notes (Signed)
Initiated Open Heart Rapid Wean per Protocol 

## 2022-05-20 ENCOUNTER — Encounter (HOSPITAL_COMMUNITY): Payer: Self-pay | Admitting: Surgery

## 2022-05-20 ENCOUNTER — Inpatient Hospital Stay (HOSPITAL_COMMUNITY): Payer: Medicare Other

## 2022-05-20 LAB — CBC
HCT: 31.1 % — ABNORMAL LOW (ref 39.0–52.0)
HCT: 31.1 % — ABNORMAL LOW (ref 39.0–52.0)
Hemoglobin: 10.6 g/dL — ABNORMAL LOW (ref 13.0–17.0)
Hemoglobin: 9.9 g/dL — ABNORMAL LOW (ref 13.0–17.0)
MCH: 32.1 pg (ref 26.0–34.0)
MCH: 30.9 pg (ref 26.0–34.0)
MCHC: 34.1 g/dL (ref 30.0–36.0)
MCHC: 31.8 g/dL (ref 30.0–36.0)
MCV: 94.2 fL (ref 80.0–100.0)
MCV: 97.2 fL (ref 80.0–100.0)
Platelets: 138 10*3/uL — ABNORMAL LOW (ref 150–400)
Platelets: 143 10*3/uL — ABNORMAL LOW (ref 150–400)
RBC: 3.3 MIL/uL — ABNORMAL LOW (ref 4.22–5.81)
RBC: 3.2 MIL/uL — ABNORMAL LOW (ref 4.22–5.81)
RDW: 14.4 % (ref 11.5–15.5)
RDW: 14.6 % (ref 11.5–15.5)
WBC: 8.1 10*3/uL (ref 4.0–10.5)
WBC: 9.2 10*3/uL (ref 4.0–10.5)
nRBC: 0 % (ref 0.0–0.2)
nRBC: 0 % (ref 0.0–0.2)

## 2022-05-20 LAB — BASIC METABOLIC PANEL
Anion gap: 6 (ref 5–15)
Anion gap: 9 (ref 5–15)
BUN: 9 mg/dL (ref 8–23)
BUN: 13 mg/dL (ref 8–23)
CO2: 21 mmol/L — ABNORMAL LOW (ref 22–32)
CO2: 26 mmol/L (ref 22–32)
Calcium: 7.6 mg/dL — ABNORMAL LOW (ref 8.9–10.3)
Calcium: 8.1 mg/dL — ABNORMAL LOW (ref 8.9–10.3)
Chloride: 105 mmol/L (ref 98–111)
Chloride: 98 mmol/L (ref 98–111)
Creatinine, Ser: 0.98 mg/dL (ref 0.61–1.24)
Creatinine, Ser: 1.13 mg/dL (ref 0.61–1.24)
GFR, Estimated: >60 mL/min (ref >60)
GFR, Estimated: >60 mL/min (ref >60)
Glucose, Bld: 135 mg/dL — ABNORMAL HIGH (ref 70–99)
Glucose, Bld: 131 mg/dL — ABNORMAL HIGH (ref 70–99)
Potassium: 4.2 mmol/L (ref 3.5–5.1)
Potassium: 4.5 mmol/L (ref 3.5–5.1)
Sodium: 132 mmol/L — ABNORMAL LOW (ref 135–145)
Sodium: 133 mmol/L — ABNORMAL LOW (ref 135–145)

## 2022-05-20 LAB — MAGNESIUM
Magnesium: 2.4 mg/dL (ref 1.7–2.4)
Magnesium: 2.2 mg/dL (ref 1.7–2.4)

## 2022-05-20 LAB — GLUCOSE, CAPILLARY
Glucose-Capillary: 122 mg/dL — ABNORMAL HIGH (ref 70–99)
Glucose-Capillary: 129 mg/dL — ABNORMAL HIGH (ref 70–99)
Glucose-Capillary: 74 mg/dL (ref 70–99)
Glucose-Capillary: 78 mg/dL (ref 70–99)
Glucose-Capillary: 86 mg/dL (ref 70–99)

## 2022-05-20 MED ORDER — FUROSEMIDE 10 MG/ML IJ SOLN
40.0000 mg | Freq: Two times a day (BID) | INTRAMUSCULAR | Status: AC
  Administered 2022-05-20 (×2): 40 mg via INTRAVENOUS
  Filled 2022-05-20 (×2): qty 4

## 2022-05-20 MED ORDER — POTASSIUM CHLORIDE CRYS ER 20 MEQ PO TBCR
20.0000 meq | EXTENDED_RELEASE_TABLET | Freq: Two times a day (BID) | ORAL | Status: AC
  Administered 2022-05-20 (×2): 20 meq via ORAL
  Filled 2022-05-20 (×2): qty 1

## 2022-05-20 MED ORDER — ENOXAPARIN SODIUM 40 MG/0.4ML IJ SOSY
40.0000 mg | PREFILLED_SYRINGE | Freq: Every day | INTRAMUSCULAR | Status: DC
  Administered 2022-05-20 – 2022-05-24 (×5): 40 mg via SUBCUTANEOUS
  Filled 2022-05-20 (×5): qty 0.4

## 2022-05-20 NOTE — Evaluation (Signed)
Physical Therapy Evaluation Patient Details Name: Victor [name] MRN: [id number] DOB: May 19, 1955 Today's Date: 05/20/2022  History of Present Illness  Pt is a 67 y.o. male who presented 05/19/22 for CABG x5. PMH: chronic back pain, anxiety, back sxs and cervical sxs. DDD, fibromyalgia, HTN, prediabetes, HLD, neuropathy bil feet, PVD sleep apnea, R TKA, BIL TSA, gout   Clinical Impression  Pt presents with condition above and deficits mentioned below, see PT Problem List. PTA, he was independent without an AD for functional mobility, living with his wife in a 1-level house with 3 STE. Pt has a hx of bil shoulder deficits and bil TSA, which he reports has worsened recently. Currently, pt is needing up to minA for all functional mobility, utilizing an Harmon Pier walker to ambulate household distances. He displays deficits in strength, balance, power, and activity tolerance. Pt will likely progress well, thus anticipate pt could d/c home with family support and HHPT follow-up. Will continue to follow acutely.       Recommendations for follow up therapy are one component of a multi-disciplinary discharge planning process, led by the attending physician.  Recommendations may be updated based on patient status, additional functional criteria and insurance authorization.  Follow Up Recommendations Home health PT      Assistance Recommended at Discharge Intermittent Supervision/Assistance  Patient can return home with the following  A Beed help with walking and/or transfers;A Stanger help with bathing/dressing/bathroom;Assistance with cooking/housework;Assist for transportation;Help with stairs or ramp for entrance    Equipment Recommendations None recommended by PT  Recommendations for Other Services  OT consult    Functional Status Assessment Patient has had a recent decline in their functional status and demonstrates the ability to make significant improvements in function in a reasonable and  predictable amount of time.     Precautions / Restrictions Precautions Precautions: Fall;Sternal;Other (comment) Precaution Booklet Issued: No Precaution Comments: reviewed precautions; watch BP (low 3/23); bil shoulder limitations (bil TSA hx) Restrictions Weight Bearing Restrictions: Yes Other Position/Activity Restrictions: sternal precautions      Mobility  Bed Mobility Overal bed mobility: Needs Assistance Bed Mobility: Rolling, Sidelying to Sit Rolling: Min guard Sidelying to sit: Min assist, HOB elevated       General bed mobility comments: Extra time with pt adducting R leg across body to roll onto his L side. MinA to ascend trunk and scoot hips to EOB.    Transfers Overall transfer level: Needs assistance Equipment used:  Harmon Pier walker) Transfers: Sit to/from Stand Sit to Stand: Min assist           General transfer comment: Light minA to power up to stand and gain balance with pt hugging pillow to maintain precautions.    Ambulation/Gait Ambulation/Gait assistance: Min assist Gait Distance (Feet): 70 Feet Assistive device: Ethelene Hal Gait Pattern/deviations: Step-through pattern, Decreased stride length, Drifts right/left Gait velocity: reduced Gait velocity interpretation: <1.31 ft/sec, indicative of household ambulator   General Gait Details: Pt with noted sway, drifting laterally intermittently, needing intermittent minA to maintain balance. Noted small steps and pt shaking in knees and arms, thus cued pt to just ambulate laps in room this date for pt safety.  Stairs            Wheelchair Mobility    Modified Rankin (Stroke Patients Only)       Balance Overall balance assessment: Needs assistance Sitting-balance support: No upper extremity supported, Feet supported Sitting balance-Leahy Scale: Fair     Standing balance support: Bilateral upper extremity  supported, During functional activity, Reliant on assistive device for  balance Standing balance-Leahy Scale: Poor Standing balance comment: Reliant on UE support and up to minA                             Pertinent Vitals/Pain Pain Assessment Pain Assessment: Faces Faces Pain Scale: Hurts even more Pain Location: sternum, back, shoulders Pain Descriptors / Indicators: Discomfort, Grimacing, Guarding, Operative site guarding Pain Intervention(s): Limited activity within patient's tolerance, Monitored during session, Patient requesting pain meds-RN notified, Repositioned    Home Living Family/patient expects to be discharged to:: Private residence Living Arrangements: Spouse/significant other Available Help at Discharge: Family;Available 24 hours/day Type of Home: House Home Access: Stairs to enter Entrance Stairs-Rails: Can reach both Entrance Stairs-Number of Steps: 3   Home Layout: One level Home Equipment: Conservation officer, nature (2 wheels);BSC/3in1;Cane - single point;Shower seat      Prior Function Prior Level of Function : Driving;Needs assist             Mobility Comments: Independent with no AD ADLs Comments: Pt has had more difficulty with his shoulders recently, resulting in him needing some assistance for bathing and dressing; does light cooking and cleaning     Hand Dominance   Dominant Hand: Right    Extremity/Trunk Assessment   Upper Extremity Assessment Upper Extremity Assessment: Defer to OT evaluation    Lower Extremity Assessment Lower Extremity Assessment: Generalized weakness (pt reports his L leg is his more painful leg)    Cervical / Trunk Assessment Cervical / Trunk Assessment: Other exceptions Cervical / Trunk Exceptions: sternal precautions  Communication   Communication: No difficulties  Cognition Arousal/Alertness: Awake/alert Behavior During Therapy: WFL for tasks assessed/performed Overall Cognitive Status: Within Functional Limits for tasks assessed                                  General Comments: Noted some questionable awareness deficits, but likely his baseline        General Comments General comments (skin integrity, edema, etc.): SpO2 ranging from 80s% briefly at times with functional mobility to 90s% majority of time, 2L with gait, RA at rest with sats in 90s%; BP: 92/62 (73) supine, 95/74 (82) sitting, 94/43 (58) standing, 92/62 (71) after ambulating, 88/76 (82) sitting in recliner end of session    Exercises     Assessment/Plan    PT Assessment Patient needs continued PT services  PT Problem List Decreased strength;Decreased activity tolerance;Decreased balance;Decreased mobility;Decreased knowledge of precautions;Cardiopulmonary status limiting activity       PT Treatment Interventions DME instruction;Gait training;Stair training;Functional mobility training;Therapeutic activities;Therapeutic exercise;Balance training;Neuromuscular re-education;Patient/family education    PT Goals (Current goals can be found in the Care Plan section)  Acute Rehab PT Goals Patient Stated Goal: to improve PT Goal Formulation: With patient/family Time For Goal Achievement: 06/03/22 Potential to Achieve Goals: Good    Frequency Min 1X/week     Co-evaluation               AM-PAC PT "6 Clicks" Mobility  Outcome Measure Help needed turning from your back to your side while in a flat bed without using bedrails?: A Show Help needed moving from lying on your back to sitting on the side of a flat bed without using bedrails?: A Kohlman Help needed moving to and from a bed to a chair (including a wheelchair)?: A  Seyler Help needed standing up from a chair using your arms (e.g., wheelchair or bedside chair)?: A Crespi Help needed to walk in hospital room?: A Fronek Help needed climbing 3-5 steps with a railing? : A Lot 6 Click Score: 17    End of Session Equipment Utilized During Treatment: Gait belt Activity Tolerance: Patient tolerated treatment well Patient  left: in chair;with call bell/phone within reach;with chair alarm set;with family/visitor present;with nursing/sitter in room Nurse Communication: Mobility status;Patient requests pain meds;Other (comment) (vitals) PT Visit Diagnosis: Unsteadiness on feet (R26.81);Other abnormalities of gait and mobility (R26.89);Muscle weakness (generalized) (M62.81);Difficulty in walking, not elsewhere classified (R26.2)    Time: [id number] PT Time Calculation (min) (ACUTE ONLY): 42 min   Charges:   PT Evaluation $PT Eval Moderate Complexity: 1 Mod PT Treatments $Gait Training: 8-22 mins $Therapeutic Activity: 8-22 mins        Moishe Spice, PT, DPT Acute Rehabilitation Services  Office: [phone]   Orvan Falconer 05/20/2022, 4:03 PM

## 2022-05-20 NOTE — Progress Notes (Signed)
1 Day Post-Op Procedure(s) (LRB): CORONARY ARTERY BYPASS GRAFTING (CABG) X FIVE BYPASSES USING OPEN LEFT INTERNAL MAMMARY ARTERY AND ENDOSCOPIC LEFT GREATER SAPHENOUS VEIN HARVEST (N/A) TRANSESOPHAGEAL ECHOCARDIOGRAM (N/A) Subjective: Complains of pain and not sleeping overnight due to monitor alarms.  Objective: Vital signs in last 24 hours: Temp:  [96.8 F (36 C)-100.8 F (38.2 C)] 99.5 F (37.5 C) (03/23 0700) Pulse Rate:  [75-87] 77 (03/23 0700) Cardiac Rhythm: Normal sinus rhythm (03/23 0415) Resp:  [5-30] 16 (03/23 0700) BP: (85-110)/(55-80) 101/80 (03/23 0700) SpO2:  [67 %-100 %] 95 % (03/23 0700) Arterial Line BP: (75-148)/(48-97) 100/62 (03/23 0700) FiO2 (%):  [50 %] 50 % (03/22 1255) Weight:  [106.2 kg] 106.2 kg (03/23 0500)  Hemodynamic parameters for last 24 hours: PAP: (21-80)/(10-51) 29/15 CO:  [3.4 L/min-5.5 L/min] 4.3 L/min CI:  [1.6 L/min/m2-2.5 L/min/m2] 2 L/min/m2  Intake/Output from previous day: 03/22 0701 - 03/23 0700 In: 6056.7 [P.O.:360; I.V.:3449.7; Blood:236; IV [id number] Out: [id number]; Blood:383; Chest Tube:690] Intake/Output this shift: No intake/output data recorded.  General appearance: alert and cooperative Neurologic: intact Heart: regular rate and rhythm, S1, S2 normal, no murmur, click, rub or gallop Lungs: clear to auscultation bilaterally Extremities: edema mild Wound: dressings dry  Lab Results: Recent Labs    05/19/22 1832 05/19/22 1839 05/20/22 0438  WBC 8.2  --  8.1  HGB 10.9* 10.9* 10.6*  HCT 31.7* 32.0* 31.1*  PLT 148*  --  138*   BMET:  Recent Labs    05/19/22 1832 05/19/22 1839 05/20/22 0438  NA 137 139 132*  K 4.4 4.6 4.2  CL 107  --  105  CO2 24  --  21*  GLUCOSE 103*  --  135*  BUN 10  --  9  CREATININE 0.81  --  0.98  CALCIUM 7.5*  --  7.6*    PT/INR:  Recent Labs    05/19/22 1314  LABPROT 16.4*  INR 1.3*   ABG    Component Value Date/Time   PHART 7.364 05/19/2022 1839   HCO3  24.0 05/19/2022 1839   TCO2 25 05/19/2022 1839   ACIDBASEDEF 1.0 05/19/2022 1839   O2SAT 95 05/19/2022 1839   CBG (last 3)  Recent Labs    05/19/22 2258 05/19/22 2339 05/20/22 0441  GLUCAP 100* 101* 129*   CXR: clear  ECG: sinus, no acute changes  Assessment/Plan: S/P Procedure(s) (LRB): CORONARY ARTERY BYPASS GRAFTING (CABG) X FIVE BYPASSES USING OPEN LEFT INTERNAL MAMMARY ARTERY AND ENDOSCOPIC LEFT GREATER SAPHENOUS VEIN HARVEST (N/A) TRANSESOPHAGEAL ECHOCARDIOGRAM (N/A)  POD 1 Hemodynamically stable. Continue Lopressor.  Glucose under good control on SSI. No hx of DM and preop Hgb A1c 6.0.  Volume excess: start diuresis  Chronic pain from back and shoulders. Continue dilaudid as needed.  DC chest tubes, swan, arterial line.   OOB, IS,  PT consult for help with mobilization.   LOS: 1 day    Victor [name] 05/20/2022

## 2022-05-20 NOTE — Progress Notes (Signed)
Patient ID: Victor [name], male   DOB: Sep 29, 1955, 67 y.o.   MRN: [id number] TCTS Evening Rounds:  Hemodynamically stable but back on a George neo this afternoon, probably related to pain meds and lasix.  Was up in chair for a couple hrs and walked a Swor.  BMET    Component Value Date/Time   NA 133 (L) 05/20/2022 1612   NA 133 (L) 02/02/2022 1252   NA 138 06/17/2012 1349   K 4.5 05/20/2022 1612   K 4.2 06/17/2012 1349   CL 98 05/20/2022 1612   CL 106 06/17/2012 1349   CO2 26 05/20/2022 1612   CO2 21 (L) 06/17/2012 1349   GLUCOSE 131 (H) 05/20/2022 1612   GLUCOSE 114 (H) 06/17/2012 1349   BUN 13 05/20/2022 1612   BUN 19 02/02/2022 1252   BUN 11.1 06/17/2012 1349   CREATININE 1.13 05/20/2022 1612   CREATININE 1.0 06/17/2012 1349   CALCIUM 8.1 (L) 05/20/2022 1612   CALCIUM 9.6 06/17/2012 1349   EGFR 80 02/02/2022 1252   GFRNONAA >60 05/20/2022 1612   CBC    Component Value Date/Time   WBC 9.2 05/20/2022 1612   RBC 3.20 (L) 05/20/2022 1612   HGB 9.9 (L) 05/20/2022 1612   HGB 17.4 01/31/2022 1513   HGB 18.2 (H) 06/17/2012 1348   HCT 31.1 (L) 05/20/2022 1612   HCT 51.7 (H) 01/31/2022 1513   HCT 53.5 (H) 06/17/2012 1348   PLT 143 (L) 05/20/2022 1612   PLT 225 01/31/2022 1513   MCV 97.2 05/20/2022 1612   MCV 94 01/31/2022 1513   MCV 90.9 06/17/2012 1348   MCH 30.9 05/20/2022 1612   MCHC 31.8 05/20/2022 1612   RDW 14.6 05/20/2022 1612   RDW 13.9 01/31/2022 1513   RDW 14.4 06/17/2012 1348   LYMPHSABS 1,252 11/24/2020 1118   LYMPHSABS 2.4 06/17/2012 1348   MONOABS 0.7 05/20/2020 1037   MONOABS 0.6 06/17/2012 1348   EOSABS 143 11/24/2020 1118   EOSABS 0.1 06/17/2012 1348   BASOSABS 19 11/24/2020 1118   BASOSABS 0.0 06/17/2012 1348

## 2022-05-21 ENCOUNTER — Inpatient Hospital Stay (HOSPITAL_COMMUNITY): Payer: Medicare Other

## 2022-05-21 LAB — GLUCOSE, CAPILLARY
Glucose-Capillary: 121 mg/dL — ABNORMAL HIGH (ref 70–99)
Glucose-Capillary: 129 mg/dL — ABNORMAL HIGH (ref 70–99)
Glucose-Capillary: 163 mg/dL — ABNORMAL HIGH (ref 70–99)
Glucose-Capillary: 72 mg/dL (ref 70–99)
Glucose-Capillary: 92 mg/dL (ref 70–99)
Glucose-Capillary: 93 mg/dL (ref 70–99)

## 2022-05-21 LAB — CBC
HCT: 29 % — ABNORMAL LOW (ref 39.0–52.0)
Hemoglobin: 9.3 g/dL — ABNORMAL LOW (ref 13.0–17.0)
MCH: 31.1 pg (ref 26.0–34.0)
MCHC: 32.1 g/dL (ref 30.0–36.0)
MCV: 97 fL (ref 80.0–100.0)
Platelets: 135 10*3/uL — ABNORMAL LOW (ref 150–400)
RBC: 2.99 MIL/uL — ABNORMAL LOW (ref 4.22–5.81)
RDW: 14.4 % (ref 11.5–15.5)
WBC: 7.7 10*3/uL (ref 4.0–10.5)
nRBC: 0 % (ref 0.0–0.2)

## 2022-05-21 LAB — BASIC METABOLIC PANEL
Anion gap: 5 (ref 5–15)
BUN: 14 mg/dL (ref 8–23)
CO2: 26 mmol/L (ref 22–32)
Calcium: 8 mg/dL — ABNORMAL LOW (ref 8.9–10.3)
Chloride: 100 mmol/L (ref 98–111)
Creatinine, Ser: 1.06 mg/dL (ref 0.61–1.24)
GFR, Estimated: >60 mL/min (ref >60)
Glucose, Bld: 116 mg/dL — ABNORMAL HIGH (ref 70–99)
Potassium: 4.2 mmol/L (ref 3.5–5.1)
Sodium: 131 mmol/L — ABNORMAL LOW (ref 135–145)

## 2022-05-21 MED ORDER — AMIODARONE HCL IN DEXTROSE 360-4.14 MG/200ML-% IV SOLN
60.0000 mg/h | INTRAVENOUS | Status: AC
  Administered 2022-05-21 (×2): 60 mg/h via INTRAVENOUS
  Filled 2022-05-21: qty 200

## 2022-05-21 MED ORDER — POTASSIUM CHLORIDE CRYS ER 20 MEQ PO TBCR
20.0000 meq | EXTENDED_RELEASE_TABLET | Freq: Two times a day (BID) | ORAL | Status: DC
  Administered 2022-05-21: 20 meq via ORAL
  Filled 2022-05-21: qty 1

## 2022-05-21 MED ORDER — FUROSEMIDE 10 MG/ML IJ SOLN
40.0000 mg | Freq: Two times a day (BID) | INTRAMUSCULAR | Status: AC
  Administered 2022-05-21 (×2): 40 mg via INTRAVENOUS
  Filled 2022-05-21 (×2): qty 4

## 2022-05-21 MED ORDER — AMIODARONE IV BOLUS ONLY 150 MG/100ML
150.0000 mg | Freq: Once | INTRAVENOUS | Status: AC
  Administered 2022-05-21: 150 mg via INTRAVENOUS

## 2022-05-21 MED ORDER — AMIODARONE HCL IN DEXTROSE 360-4.14 MG/200ML-% IV SOLN
INTRAVENOUS | Status: AC
  Filled 2022-05-21: qty 200

## 2022-05-21 MED ORDER — AMIODARONE HCL IN DEXTROSE 360-4.14 MG/200ML-% IV SOLN
30.0000 mg/h | INTRAVENOUS | Status: DC
  Administered 2022-05-21 – 2022-05-22 (×2): 30 mg/h via INTRAVENOUS
  Filled 2022-05-21 (×2): qty 200

## 2022-05-21 MED ORDER — POTASSIUM CHLORIDE CRYS ER 20 MEQ PO TBCR
40.0000 meq | EXTENDED_RELEASE_TABLET | Freq: Once | ORAL | Status: AC
  Administered 2022-05-21: 40 meq via ORAL
  Filled 2022-05-21: qty 2

## 2022-05-21 MED ORDER — AMIODARONE LOAD VIA INFUSION
150.0000 mg | Freq: Once | INTRAVENOUS | Status: AC
  Administered 2022-05-21: 150 mg via INTRAVENOUS

## 2022-05-21 NOTE — Evaluation (Signed)
Occupational Therapy Evaluation Patient Details Name: Victor [name] MRN: [id number] DOB: 05/14/1955 Today's Date: 05/21/2022   History of Present Illness Pt is a 67 y.o. male who presented 05/19/22 for CABG x5. PMH: chronic back pain, anxiety, back sxs and cervical sxs. DDD, fibromyalgia, HTN, prediabetes, HLD, neuropathy bil feet, PVD sleep apnea, R TKA, BIL TSA, gout   Clinical Impression   Victor [name] was evaluated s/p the above admission list. He needs minimal assist from his wife at baseline due to chronic bilateral shoulder problems. Upon evaluation he was limited by pain, soft Bps, poor activity tolerance, sternal precautions and generalized weakness. Overall he requires min G for transfers and mobility with Harmon Pier walker and cues for BUE restrictions.  Due to the deficits listed below, he also requires up to mod A for UB ADLs and max A for LB ADLs. His HR went to 130s with mobility and his BP was soft but stable throughout. Pt will benefit from continued acute OT services. Recommend d/c home with [place].       Recommendations for follow up therapy are one component of a multi-disciplinary discharge planning process, led by the attending physician.  Recommendations may be updated based on patient status, additional functional criteria and insurance authorization.   Follow Up Recommendations  Home health OT     Assistance Recommended at Discharge Frequent or constant Supervision/Assistance  Patient can return home with the following A Misiaszek help with walking and/or transfers;A Vaneaton help with bathing/dressing/bathroom;Assistance with cooking/housework;Assist for transportation;Help with stairs or ramp for entrance    Functional Status Assessment  Patient has had a recent decline in their functional status and demonstrates the ability to make significant improvements in function in a reasonable and predictable amount of time.  Equipment Recommendations  None recommended by OT        Precautions / Restrictions Precautions Precautions: Fall;Sternal;Other (comment) Precaution Comments: reviewed precautions; watch BP (low 3/23); bil shoulder limitations (bil TSA hx) Restrictions Weight Bearing Restrictions: No Other Position/Activity Restrictions: sternal precautions      Mobility Bed Mobility Overal bed mobility: Needs Assistance Bed Mobility: Rolling, Sidelying to Sit, Sit to Supine Rolling: Min guard Sidelying to sit: Min assist   Sit to supine: Mod assist        Transfers Overall transfer level: Needs assistance Equipment used:  Harmon Pier) Transfers: Sit to/from Stand Sit to Stand: Min guard                  Balance Overall balance assessment: Needs assistance Sitting-balance support: No upper extremity supported, Feet supported Sitting balance-Leahy Scale: Fair     Standing balance support: Bilateral upper extremity supported, During functional activity, Reliant on assistive device for balance Standing balance-Leahy Scale: Poor Standing balance comment: bilateral arms supported on Eva during grooming                           ADL either performed or assessed with clinical judgement   ADL Overall ADL's : Needs assistance/impaired Eating/Feeding: Independent;Sitting   Grooming: Min guard;Standing Grooming Details (indicate cue type and reason): at the sink Upper Body Bathing: Minimal assistance;Sitting   Lower Body Bathing: Maximal assistance;Sit to/from stand   Upper Body Dressing : Moderate assistance;Sitting   Lower Body Dressing: Maximal assistance;Sit to/from stand   Toilet Transfer: Min guard;Ambulation   Toileting- Clothing Manipulation and Hygiene: Maximal assistance;Sit to/from stand       Functional mobility during ADLs: Min guard General ADL Comments: Harmon Pier  used this session. limited by baseline  bilateral shoulder deficits, sternal precautions, activity tolerance     Vision Baseline Vision/History: 0 No  visual deficits Vision Assessment?: No apparent visual deficits     Perception Perception Perception Tested?: No   Praxis Praxis Praxis tested?: Within functional limits    Pertinent Vitals/Pain Pain Assessment Pain Assessment: Faces Faces Pain Scale: Hurts Erichsen more Pain Location: sternum Pain Descriptors / Indicators: Discomfort, Grimacing, Guarding, Operative site guarding Pain Intervention(s): Limited activity within patient's tolerance, Monitored during session     Hand Dominance Right   Extremity/Trunk Assessment Upper Extremity Assessment Upper Extremity Assessment: Generalized weakness   Lower Extremity Assessment Lower Extremity Assessment: RLE deficits/detail;LLE deficits/detail RLE Deficits / Details: hx of TSA, very limited AROM, chronic pain at rest LLE Deficits / Details: hx of TSA, very limited AROM, chronic pain at rest   Cervical / Trunk Assessment Cervical / Trunk Assessment: Other exceptions Cervical / Trunk Exceptions: sternal precautions   Communication Communication Communication: No difficulties   Cognition Arousal/Alertness: Awake/alert Behavior During Therapy: WFL for tasks assessed/performed Overall Cognitive Status: Within Functional Limits for tasks assessed                                       General Comments  HR to 130s with activity, pt stated he could feel it "flutter". +soft BPs    Exercises     Shoulder Instructions      Home Living Family/patient expects to be discharged to:: Private residence Living Arrangements: Spouse/significant other Available Help at Discharge: Family;Available 24 hours/day Type of Home: House Home Access: Stairs to enter [employer] of Steps: 3 Entrance Stairs-Rails: Can reach both Home Layout: One level     Bathroom Shower/Tub: Tub/shower unit     Bathroom Accessibility: Yes   Home Equipment: Conservation officer, nature (2 wheels);BSC/3in1;Cane - single point;Shower seat           Prior Functioning/Environment Prior Level of Function : Driving;Needs assist             Mobility Comments: Independent with no AD ADLs Comments: Pt has had more difficulty with his shoulders recently, resulting in him needing some assistance for bathing and dressing; does light cooking and cleaning        OT Problem List: Decreased strength;Decreased range of motion;Impaired balance (sitting and/or standing);Decreased activity tolerance;Decreased knowledge of precautions;Cardiopulmonary status limiting activity;Decreased safety awareness;Impaired UE functional use      OT Treatment/Interventions: Self-care/ADL training;Therapeutic exercise;Energy conservation;DME and/or AE instruction;Therapeutic activities;Balance training;Patient/family education    OT Goals(Current goals can be found in the care plan section) Acute Rehab OT Goals Patient Stated Goal: home OT Goal Formulation: With patient Time For Goal Achievement: 06/04/22 Potential to Achieve Goals: Good ADL Goals Pt Will Perform Upper Body Dressing: with set-up;sitting Pt Will Perform Lower Body Dressing: with min assist;sit to/from stand Pt Will Transfer to Toilet: with supervision;ambulating Additional ADL Goal #1: Pt will indep maintain sternal precautions 100% of session  OT Frequency: Min 2X/week       AM-PAC OT "6 Clicks" Daily Activity     Outcome Measure Help from another person eating meals?: None Help from another person taking care of personal grooming?: A Washer Help from another person toileting, which includes using toliet, bedpan, or urinal?: A Lot Help from another person bathing (including washing, rinsing, drying)?: A Lot Help from another person to put on and taking off  regular upper body clothing?: A Lot Help from another person to put on and taking off regular lower body clothing?: A Lot 6 Click Score: 15   End of Session Equipment Utilized During Treatment: Other (comment) Harmon Pier) Nurse  Communication: Mobility status  Activity Tolerance: Patient tolerated treatment well Patient left: in bed;with call bell/phone within reach;with bed alarm set  OT Visit Diagnosis: Unsteadiness on feet (R26.81);Other abnormalities of gait and mobility (R26.89);Muscle weakness (generalized) (M62.81)                Time: 1333-1400 OT Time Calculation (min): 27 min Charges:  OT General Charges $OT Visit: 1 Visit OT Evaluation $OT Eval Moderate Complexity: 1 Mod OT Treatments $Self Care/Home Management : 8-22 mins  Shade Flood, OTR/L [place] [facility] Communication Preferred   Elliot Cousin 05/21/2022, 2:33 PM

## 2022-05-21 NOTE — Progress Notes (Signed)
2 Days Post-Op Procedure(s) (LRB): CORONARY ARTERY BYPASS GRAFTING (CABG) X FIVE BYPASSES USING OPEN LEFT INTERNAL MAMMARY ARTERY AND ENDOSCOPIC LEFT GREATER SAPHENOUS VEIN HARVEST (N/A) TRANSESOPHAGEAL ECHOCARDIOGRAM (N/A) Subjective: Feels better overall. Pain under adequate control.  Ambulated 150 ft this am.  Objective: Vital signs in last 24 hours: Temp:  [97.9 F (36.6 C)-99.5 F (37.5 C)] 99.5 F (37.5 C) (03/24 0810) Pulse Rate:  [72-104] 104 (03/24 0600) Cardiac Rhythm: Normal sinus rhythm (03/24 0400) Resp:  [14-27] 26 (03/24 0600) BP: (86-149)/(53-129) 128/72 (03/24 0600) SpO2:  [89 %-100 %] 92 % (03/24 0600) Weight:  [108.4 kg] 108.4 kg (03/24 0400)  Hemodynamic parameters for last 24 hours:    Intake/Output from previous day: 03/23 0701 - 03/24 0700 In: 1571.9 [P.O.:800; I.V.:480.2; IV Piggyback:291.7] Out: 2895 [Urine:2785; Chest Tube:110] Intake/Output this shift: No intake/output data recorded.  General appearance: alert and cooperative Neurologic: intact Heart: regular rate and rhythm, S1, S2 normal, no murmur Lungs: clear to auscultation bilaterally Extremities: edema mild Wound: dressings dry  Lab Results: Recent Labs    05/20/22 1612 05/21/22 0412  WBC 9.2 7.7  HGB 9.9* 9.3*  HCT 31.1* 29.0*  PLT 143* 135*   BMET:  Recent Labs    05/20/22 1612 05/21/22 0412  NA 133* 131*  K 4.5 4.2  CL 98 100  CO2 26 26  GLUCOSE 131* 116*  BUN 13 14  CREATININE 1.13 1.06  CALCIUM 8.1* 8.0*    PT/INR:  Recent Labs    05/19/22 1314  LABPROT 16.4*  INR 1.3*   ABG    Component Value Date/Time   PHART 7.364 05/19/2022 1839   HCO3 24.0 05/19/2022 1839   TCO2 25 05/19/2022 1839   ACIDBASEDEF 1.0 05/19/2022 1839   O2SAT 95 05/19/2022 1839   CBG (last 3)  Recent Labs    05/20/22 2330 05/21/22 0413 05/21/22 0806  GLUCAP 74 121* 163*   CXR: clear  Assessment/Plan: S/P Procedure(s) (LRB): CORONARY ARTERY BYPASS GRAFTING (CABG) X FIVE  BYPASSES USING OPEN LEFT INTERNAL MAMMARY ARTERY AND ENDOSCOPIC LEFT GREATER SAPHENOUS VEIN HARVEST (N/A) TRANSESOPHAGEAL ECHOCARDIOGRAM (N/A)  POD 2  Hemodynamically stable in sinus rhythm. Continue Lopressor.  Volume excess: -1300 cc yesterday. Wt about 9 lbs over preop. Continue diuresis and KCL. Keep foley in today for diuresis with BPH and poor mobility.  Glucose under adequate control with no hx of DM. Stop CBG's  IS, ambulation, PT   LOS: 2 days    Gaye Pollack 05/21/2022

## 2022-05-21 NOTE — Progress Notes (Signed)
TCTS Evening Rounds:  Hemodynamically stable. Went into atrial fib with RVR this afternoon. Started on amio IV but still AF with rate low 100's. Will give him an additional bolus of amio.   Excellent diuresis today. Give extra Kdur.

## 2022-05-22 LAB — GLUCOSE, CAPILLARY
Glucose-Capillary: 76 mg/dL (ref 70–99)
Glucose-Capillary: 105 mg/dL — ABNORMAL HIGH (ref 70–99)

## 2022-05-22 MED ORDER — METOPROLOL TARTRATE 25 MG PO TABS
25.0000 mg | ORAL_TABLET | Freq: Two times a day (BID) | ORAL | Status: DC
  Administered 2022-05-22 – 2022-05-25 (×5): 25 mg via ORAL
  Filled 2022-05-22 (×7): qty 1

## 2022-05-22 MED ORDER — METOPROLOL TARTRATE 25 MG PO TABS
25.0000 mg | ORAL_TABLET | Freq: Two times a day (BID) | ORAL | Status: DC
  Administered 2022-05-22: 25 mg via ORAL
  Filled 2022-05-22: qty 1

## 2022-05-22 MED ORDER — SODIUM CHLORIDE 0.9% FLUSH
3.0000 mL | Freq: Two times a day (BID) | INTRAVENOUS | Status: DC
  Administered 2022-05-22 – 2022-05-24 (×5): 3 mL via INTRAVENOUS

## 2022-05-22 MED ORDER — ASPIRIN 325 MG PO TBEC
325.0000 mg | DELAYED_RELEASE_TABLET | Freq: Every day | ORAL | Status: DC
  Administered 2022-05-23 – 2022-05-25 (×3): 325 mg via ORAL
  Filled 2022-05-22 (×3): qty 1

## 2022-05-22 MED ORDER — MAGNESIUM HYDROXIDE 400 MG/5ML PO SUSP
15.0000 mL | Freq: Every day | ORAL | Status: DC | PRN
  Administered 2022-05-22 – 2022-05-25 (×3): 15 mL via ORAL
  Filled 2022-05-22 (×4): qty 30

## 2022-05-22 MED ORDER — SODIUM CHLORIDE 0.9% FLUSH: 3.0000 mL | INTRAVENOUS | Status: DC | PRN

## 2022-05-22 MED ORDER — SODIUM CHLORIDE 0.9 % IV SOLN: 250.0000 mL | INTRAVENOUS | Status: DC | PRN

## 2022-05-22 MED ORDER — ~~LOC~~ CARDIAC SURGERY, PATIENT & FAMILY EDUCATION: Freq: Once | Status: AC

## 2022-05-22 MED ORDER — POTASSIUM CHLORIDE CRYS ER 20 MEQ PO TBCR
20.0000 meq | EXTENDED_RELEASE_TABLET | Freq: Two times a day (BID) | ORAL | Status: DC
  Administered 2022-05-23: 20 meq via ORAL
  Filled 2022-05-22: qty 1

## 2022-05-22 MED ORDER — FUROSEMIDE 40 MG PO TABS
40.0000 mg | ORAL_TABLET | Freq: Every day | ORAL | Status: AC
  Administered 2022-05-22 – 2022-05-23 (×2): 40 mg via ORAL
  Filled 2022-05-22 (×2): qty 1

## 2022-05-22 MED ORDER — AMIODARONE HCL 200 MG PO TABS
400.0000 mg | ORAL_TABLET | Freq: Two times a day (BID) | ORAL | Status: DC
  Administered 2022-05-22 – 2022-05-25 (×7): 400 mg via ORAL
  Filled 2022-05-22 (×7): qty 2

## 2022-05-22 MED ORDER — POTASSIUM CHLORIDE CRYS ER 20 MEQ PO TBCR
40.0000 meq | EXTENDED_RELEASE_TABLET | Freq: Once | ORAL | Status: AC
  Administered 2022-05-22: 40 meq via ORAL
  Filled 2022-05-22: qty 2

## 2022-05-22 MED ORDER — METOPROLOL TARTRATE 25 MG/10 ML ORAL SUSPENSION: 25.0000 mg | Freq: Two times a day (BID) | ORAL | Status: DC

## 2022-05-22 MED ORDER — SENNOSIDES-DOCUSATE SODIUM 8.6-50 MG PO TABS: 1.0000 | ORAL_TABLET | Freq: Two times a day (BID) | ORAL | Status: DC | PRN

## 2022-05-22 MED ORDER — ALLOPURINOL 300 MG PO TABS
450.0000 mg | ORAL_TABLET | Freq: Every day | ORAL | Status: DC
  Administered 2022-05-22 – 2022-05-25 (×4): 450 mg via ORAL
  Filled 2022-05-22 (×4): qty 2

## 2022-05-22 MED FILL — Thrombin (Recombinant) For Soln 20000 Unit: CUTANEOUS | Qty: 1 | Status: AC

## 2022-05-22 NOTE — Progress Notes (Signed)
CARDIAC REHAB PHASE I   PRE:  Rate/Rhythm: 77 SR    BP: sitting 92/62    SpO2: 100 RA  MODE:  Ambulation: 400 ft   POST:  Rate/Rhythm: 102 ST    BP: sitting 138/66     SpO2: 100 RA  Pt got out of bed with min assist, stood independently following sternal precautions. Ambulated hall with RW, slow and steady. Fatigue with distance but overall did well. Return to bed, mod assist with his legs. Pt declined recliner for now, put one in his room and encouraged use. Also got pt a new IS and encouraged use.  [place], ACSM-CEP 05/22/2022 3:11 PM

## 2022-05-22 NOTE — Progress Notes (Signed)
Physical Therapy Treatment Patient Details Name: Victor [name] MRN: [id number] DOB: 12-31-1955 Today's Date: 05/22/2022   History of Present Illness Pt is Victor 67 y.o. male who presented 05/19/22 for CABG x5. PMH: chronic back pain, anxiety, back sxs and cervical sxs. DDD, fibromyalgia, HTN, prediabetes, HLD, neuropathy bil feet, PVD sleep apnea, R TKA, BIL TSA, gout    PT Comments    Pt progressing well towards all goals. Pt continues with noted SOB/DOE of 3/4 with ambulation of 370' with RW however much improved from initial PT eval. Verbal cues to adhere to sternal precautions and decrease dependence on bilat UEs on RW with onset of fatigue. Acute PT to cont to follow.    Recommendations for follow up therapy are one component of Victor multi-disciplinary discharge planning process, led by the attending physician.  Recommendations may be updated based on patient status, additional functional criteria and insurance authorization.  Follow Up Recommendations       Assistance Recommended at Discharge Intermittent Supervision/Assistance  Patient can return home with the following Victor [name] help with walking and/or transfers;Victor [name] help with bathing/dressing/bathroom;Assistance with cooking/housework;Assist for transportation;Help with stairs or ramp for entrance   Equipment Recommendations  None recommended by PT    Recommendations for Other Services OT consult     Precautions / Restrictions Precautions Precautions: Fall;Sternal;Other (comment) Precaution Booklet Issued: No Precaution Comments: reviewed precautions; watch BP (low 3/23); bil shoulder limitations (bil TSA hx) Restrictions Weight Bearing Restrictions: Yes (sternal) Other Position/Activity Restrictions: sternal precautions     Mobility  Bed Mobility               General bed mobility comments: pt received sitting on EOB    Transfers Overall transfer level: Needs assistance Equipment used: Rolling walker  (2 wheels) Victor [name]) Transfers: Sit to/from Stand Sit to Stand: [employer] transfer comment: pt uses rocking momentum and hands on his knees with wide base of support    Ambulation/Gait Ambulation/Gait assistance: Herbalist (Feet): 370 Feet Assistive device: Rolling walker (2 wheels) Gait Pattern/deviations: Step-through pattern, Decreased stride length, Trunk flexed Gait velocity: reduced     General Gait Details: verbal cues to decrease WBing on UEs, noted SOB, 3/4 s/p walking full loop   Stairs             Wheelchair Mobility    Modified Rankin (Stroke Patients Only)       Balance Overall balance assessment: Needs assistance Sitting-balance support: No upper extremity supported, Feet supported Sitting balance-Leahy Scale: Fair     Standing balance support: Bilateral upper extremity supported, During functional activity, Reliant on assistive device for balance Standing balance-Leahy Scale: Poor Standing balance comment: pt with fair standing balance with wide base of support, requires RW for amb                            Cognition Arousal/Alertness: Awake/alert Behavior During Therapy: WFL for tasks assessed/performed Overall Cognitive Status: Within Functional Limits for tasks assessed                                          Exercises      General Comments General comments (skin integrity, edema, etc.): HR to 126bpm      Pertinent Vitals/Pain Pain Assessment Pain Assessment:  Faces Faces Pain Scale: Hurts [place] more Pain Location: sternum Pain Descriptors / Indicators: Discomfort, Grimacing, Guarding, Operative site guarding    Home Living                          Prior Function            PT Goals (current goals can now be found in the care plan section) Acute Rehab PT Goals Patient Stated Goal: to improve PT Goal Formulation: With patient/family Time For Goal  Achievement: 06/03/22 Potential to Achieve Goals: Good Progress towards PT goals: Progressing toward goals    Frequency    Min 1X/week      PT Plan Current plan remains appropriate    Co-evaluation              AM-PAC PT "6 Clicks" Mobility   Outcome Measure  Help needed turning from your back to your side while in Victor flat bed without using bedrails?: Victor Borre Help needed moving from lying on your back to sitting on the side of Victor flat bed without using bedrails?: Victor Dotson Help needed moving to and from Victor bed to Victor chair (including Victor wheelchair)?: Victor Hymon Help needed standing up from Victor chair using your arms (e.g., wheelchair or bedside chair)?: Victor Widger Help needed to walk in hospital room?: Victor Mcglaughlin Help needed climbing 3-5 steps with Victor railing? : Victor Lot 6 Click Score: 17    End of Session Equipment Utilized During Treatment: Gait belt Activity Tolerance: Patient tolerated treatment well Patient left: in chair (w/c with RN to transfer up to the floor) Nurse Communication: Mobility status PT Visit Diagnosis: Unsteadiness on feet (R26.81);Other abnormalities of gait and mobility (R26.89);Muscle weakness (generalized) (M62.81);Difficulty in walking, not elsewhere classified (R26.2)     Time: [id number] PT Time Calculation (min) (ACUTE ONLY): 14 min  Charges:  $Gait Training: 8-22 mins                     Kittie Plater, PT, DPT Acute Rehabilitation Services Secure chat preferred Office #: [phone]    Berline Lopes 05/22/2022, 10:35 AM

## 2022-05-22 NOTE — Progress Notes (Signed)
3 Days Post-Op Procedure(s) (LRB): CORONARY ARTERY BYPASS GRAFTING (CABG) X FIVE BYPASSES USING OPEN LEFT INTERNAL MAMMARY ARTERY AND ENDOSCOPIC LEFT GREATER SAPHENOUS VEIN HARVEST (N/A) TRANSESOPHAGEAL ECHOCARDIOGRAM (N/A) Subjective: No complaints. Feeling better, pain improved. Walked 270 ft this am.  Objective: Vital signs in last 24 hours: Temp:  [97.8 F (36.6 C)-99.5 F (37.5 C)] 98.7 F (37.1 C) (03/25 0330) Pulse Rate:  [74-119] 74 (03/25 0600) Cardiac Rhythm: Normal sinus rhythm (03/25 0330) Resp:  [15-26] 19 (03/25 0600) BP: (75-123)/(47-98) 110/77 (03/25 0600) SpO2:  [84 %-98 %] 97 % (03/25 0600) Weight:  [105.9 kg] 105.9 kg (03/25 0639)  Hemodynamic parameters for last 24 hours:    Intake/Output from previous day: 03/24 0701 - 03/25 0700 In: 1061.9 [P.O.:240; I.V.:721.9; IV Piggyback:100] Out: 5100 [Urine:5100] Intake/Output this shift: Total I/O In: 412.5 [P.O.:120; I.V.:292.5] Out: 1250 [Urine:1250]  General appearance: alert and cooperative Neurologic: intact Heart: regular rate and rhythm, S1, S2 normal, no murmur Lungs: clear to auscultation bilaterally Extremities: mild edema Wound: dressings dry  Lab Results: Recent Labs    05/20/22 1612 05/21/22 0412  WBC 9.2 7.7  HGB 9.9* 9.3*  HCT 31.1* 29.0*  PLT 143* 135*   BMET:  Recent Labs    05/20/22 1612 05/21/22 0412  NA 133* 131*  K 4.5 4.2  CL 98 100  CO2 26 26  GLUCOSE 131* 116*  BUN 13 14  CREATININE 1.13 1.06  CALCIUM 8.1* 8.0*    PT/INR:  Recent Labs    05/19/22 1314  LABPROT 16.4*  INR 1.3*   ABG    Component Value Date/Time   PHART 7.364 05/19/2022 1839   HCO3 24.0 05/19/2022 1839   TCO2 25 05/19/2022 1839   ACIDBASEDEF 1.0 05/19/2022 1839   O2SAT 95 05/19/2022 1839   CBG (last 3)  Recent Labs    05/21/22 2007 05/21/22 2342 05/22/22 0333  GLUCAP 129* 92 76    Assessment/Plan: S/P Procedure(s) (LRB): CORONARY ARTERY BYPASS GRAFTING (CABG) X FIVE BYPASSES  USING OPEN LEFT INTERNAL MAMMARY ARTERY AND ENDOSCOPIC LEFT GREATER SAPHENOUS VEIN HARVEST (N/A) TRANSESOPHAGEAL ECHOCARDIOGRAM (N/A)  POD 3 Hemodynamically stable in sinus rhythm. Converted last night on IV amio. Will switch to po this am and increase Lopressor to 25 bid since BP is good.  Volume excess: - 4 L yesterday. Wt now only 3.5 lbs over preop. Continue diuresis and KCL for another day or two.  DC sleeve and foley.  Transfer to 4E and continue mobilization   LOS: 3 days    Gaye Pollack 05/22/2022

## 2022-05-23 LAB — CBC
HCT: 27.8 % — ABNORMAL LOW (ref 39.0–52.0)
Hemoglobin: 9 g/dL — ABNORMAL LOW (ref 13.0–17.0)
MCH: 30.8 pg (ref 26.0–34.0)
MCHC: 32.4 g/dL (ref 30.0–36.0)
MCV: 95.2 fL (ref 80.0–100.0)
Platelets: 188 10*3/uL (ref 150–400)
RBC: 2.92 MIL/uL — ABNORMAL LOW (ref 4.22–5.81)
RDW: 14.3 % (ref 11.5–15.5)
WBC: 6.3 10*3/uL (ref 4.0–10.5)
nRBC: 0 % (ref 0.0–0.2)

## 2022-05-23 LAB — BASIC METABOLIC PANEL
Anion gap: 11 (ref 5–15)
BUN: 13 mg/dL (ref 8–23)
CO2: 25 mmol/L (ref 22–32)
Calcium: 8.9 mg/dL (ref 8.9–10.3)
Chloride: 98 mmol/L (ref 98–111)
Creatinine, Ser: 1.16 mg/dL (ref 0.61–1.24)
GFR, Estimated: >60 mL/min (ref >60)
Glucose, Bld: 112 mg/dL — ABNORMAL HIGH (ref 70–99)
Potassium: 5.2 mmol/L — ABNORMAL HIGH (ref 3.5–5.1)
Sodium: 134 mmol/L — ABNORMAL LOW (ref 135–145)

## 2022-05-23 MED ORDER — FUROSEMIDE 40 MG PO TABS: 40.0000 mg | ORAL_TABLET | Freq: Every day | ORAL | Status: DC

## 2022-05-23 MED ORDER — AMIODARONE IV BOLUS ONLY 150 MG/100ML
150.0000 mg | Freq: Once | INTRAVENOUS | Status: AC
  Administered 2022-05-23: 150 mg via INTRAVENOUS
  Filled 2022-05-23: qty 100

## 2022-05-23 NOTE — Care Management Important Message (Signed)
Important Message  Patient Details  Name: Victor [name] MRN: [id number] Date of Birth: [birth year]   Medicare Important Message Given:  Yes     Shelda Altes 05/23/2022, 8:06 AM

## 2022-05-23 NOTE — Progress Notes (Signed)
PT Cancellation Note  Patient Details Name: Victor [name] MRN: [id number] DOB: 12-28-1955   Cancelled Treatment:    Reason Eval/Treat Not Completed: Patient declined, no reason specified (pt reports just finished with OT and would like to rest for a bit. will follow up at later date/time as schedule allows.)   [place], [place] Office [phone]  05/23/22 12:57 PM

## 2022-05-23 NOTE — Progress Notes (Addendum)
Brief discussion with pt regarding mobility and home needs. Nurse arrived to remove pacing wires. Will return in afternoon to finish education and ambulation.   Pt received OHS book and education on restrictions, heart healthy diet, ex guidelines, Move in the Tube sheet, incentive spirometer use when d/c and CRPII. Pt denies questions and was encouraged to look in the book for additional information. Referral placed to [facility].    Pt wife concerned about Hgb

## 2022-05-23 NOTE — Progress Notes (Signed)
Pt in Afib currently TCTS APP [place] [address] notified verbal order to give 150 mg Amiodarone IV bolus x1 and to give  Am dose of lopressor 25 mg that was held due to hypotension Pts BP currently 114/76

## 2022-05-23 NOTE — Progress Notes (Addendum)
[address]       [place] [address]             [phone]      4 Days Post-Op Procedure(s) (LRB): CORONARY ARTERY BYPASS GRAFTING (CABG) X FIVE BYPASSES USING OPEN LEFT INTERNAL MAMMARY ARTERY AND ENDOSCOPIC LEFT GREATER SAPHENOUS VEIN HARVEST (N/A) TRANSESOPHAGEAL ECHOCARDIOGRAM (N/A) Subjective: Transferred from ICU yesterday.  Up in the bedside chair, walked in the hall several times since transfer.  No BM yet and still has poor appetite.   Objective: Vital signs in last 24 hours: Temp:  [98 F (36.7 C)-98.7 F (37.1 C)] 98.4 F (36.9 C) (03/26 0342) Pulse Rate:  [63-89] 63 (03/26 0305) Cardiac Rhythm: Normal sinus rhythm (03/25 1902) Resp:  [14-18] 17 (03/26 0305) BP: (104-127)/(42-67) 113/54 (03/26 0305) SpO2:  [93 %-100 %] 98 % (03/26 0305) Weight:  [106 kg] 106 kg (03/26 0342)    Intake/Output from previous day: 03/25 0701 - 03/26 0700 In: 767.3 [P.O.:720; I.V.:47.3] Out: -  Intake/Output this shift: No intake/output data recorded.  General appearance: alert, cooperative, and no distress Neurologic: intact Heart: NSR, no further atrial fib on monitor review Lungs: clear breath sounds, normal WOB. Sats stable on RA Abdomen: soft, no tenderness Extremities: left LE EVH incision intact and dry. Mild LE edema, L>R. Wound: The sternal incision is covered with at dry dressing. Pacer wires are in place.   Lab Results: Recent Labs    05/21/22 0412 05/23/22 0621  WBC 7.7 6.3  HGB 9.3* 9.0*  HCT 29.0* 27.8*  PLT 135* 188   BMET:  Recent Labs    05/20/22 1612 05/21/22 0412  NA 133* 131*  K 4.5 4.2  CL 98 100  CO2 26 26  GLUCOSE 131* 116*  BUN 13 14  CREATININE 1.13 1.06  CALCIUM 8.1* 8.0*    PT/INR: No results for input(s): "LABPROT", "INR" in the last 72 hours. ABG    Component Value Date/Time   PHART 7.364 05/19/2022 1839   HCO3 24.0 05/19/2022 1839   TCO2 25 05/19/2022 1839   ACIDBASEDEF 1.0 05/19/2022 1839   O2SAT  95 05/19/2022 1839   CBG (last 3)  Recent Labs    05/21/22 2342 05/22/22 0333 05/22/22 0840  GLUCAP 92 76 105*    Assessment/Plan: S/P Procedure(s) (LRB): CORONARY ARTERY BYPASS GRAFTING (CABG) X FIVE BYPASSES USING OPEN LEFT INTERNAL MAMMARY ARTERY AND ENDOSCOPIC LEFT GREATER SAPHENOUS VEIN HARVEST (N/A) TRANSESOPHAGEAL ECHOCARDIOGRAM (N/A)  -POD4 CABG x 5, normal EF. On ASA and metoprolol. Intolerant of statins. Progressing with mobility. Will remove the pacer wires today.   -Post-op atrial fibrillation- stable SR since transfer yesterday, on oral amiodarone.  BMP pending.  -Chronic pain- says control is acceptable on his usual regimen.  He said he is trying to avoid taking any additional analgesics.   -GI- tolerating PO's, no BM. Says he is not uncomfortable. Ordered MOM yesterday per his request.   -HEME- expected acute blood loss anemia. Mild with Hct 28.   -Volume excess- Wt 2kg + if accurate, UO was not recorded yesterday. Continue Lasix 40mg  PO daily.  -Disposition- Planning for discharge to home in next 1-2 days with home health nursing and home OT.    LOS: 4 days    Malon Kindle [id number] 05/23/2022   Chart reviewed, patient examined, agree with above. He looks good overall and is walking well by himself with walker. His bowels have been moving since surgery, just none  in last day or two. Possibly home tomorrow.

## 2022-05-23 NOTE — Evaluation (Signed)
Occupational Therapy Evaluation Patient Details Name: Victor [name] MRN: [id number] DOB: [birth year] Today's Date: 05/23/2022   History of Present Illness Pt is a 67 y.o. male who presented 05/19/22 for CABG x5. PMH: chronic back pain, anxiety, back sxs and cervical sxs. DDD, fibromyalgia, HTN, prediabetes, HLD, neuropathy bil feet, PVD sleep apnea, R TKA, BIL TSA, gout   Clinical Impression   Patient continuing to progress towards patient focused goals. Patient completing room level ambulation with Supervision no AD, arrived with patient walking out of bathroom. Patient requested limited OT session today d/t fatigue earlier and wanting to remain room level. Pt completed chair t/f and sit>stands x2 with close Supervision, bed mobility with close supervision while maintaining sternal precautions but Pt uses rails for support and bed features to aide in sit>supine, completed bed t/f fairly quickly using momentum to aid in transfer. Increased breathing rate noticed during room level ambulation with 22 RR, patient was like this upon arrival as well, he reports being in the restroom for 20 mins but did not state if it was to take a rest break. Pt would benefit from continued acute skilled OT services to address above deficits and help transition to next level of care. Pt remains appropriate for Home OT services to help maximize functional independence in home environment.       Recommendations for follow up therapy are one component of a multi-disciplinary discharge planning process, led by the attending physician.  Recommendations may be updated based on patient status, additional functional criteria and insurance authorization.   Assistance Recommended at Discharge Frequent or constant Supervision/Assistance  Patient can return home with the following A Branden help with walking and/or transfers;A Volk help with bathing/dressing/bathroom;Assistance with cooking/housework;Assist for  transportation;Help with stairs or ramp for entrance    Functional Status Assessment  Patient has had a recent decline in their functional status and demonstrates the ability to make significant improvements in function in a reasonable and predictable amount of time.  Equipment Recommendations  None recommended by OT    Recommendations for Other Services       Precautions / Restrictions Precautions Precautions: Fall;Sternal;Other (comment) Precaution Booklet Issued: No Precaution Comments: watch BP (low 3/23); bil shoulder limitations (bil TSA hx) Restrictions Weight Bearing Restrictions: Yes RUE Weight Bearing: Non weight bearing LUE Weight Bearing: Non weight bearing Other Position/Activity Restrictions: sternal precautions      Mobility Bed Mobility Overal bed mobility: Needs Assistance Bed Mobility: Sit to Supine       Sit to supine: HOB elevated, Supervision   General bed mobility comments: Pt uses HOB and rails to assist with sit>supine. Limited BUE ROM    Transfers Overall transfer level: Needs assistance   Transfers: Sit to/from Stand Sit to Stand: Supervision           General transfer comment: Pt completed sit>stand x2 Supervision from small chair      Balance Overall balance assessment: Needs assistance     Sitting balance - Comments: Unable to assess, arrived with patient standing in room. Returned to supine position at end of OT session.   Standing balance support: During functional activity Standing balance-Leahy Scale: Good Standing balance comment: no AD                           ADL either performed or assessed with clinical judgement   ADL Overall ADL's : Needs assistance/impaired  General ADL Comments: Pt elected not to perform bADLs this session. Focused on functional mobility     Vision Baseline Vision/History: 0 No visual deficits       Perception     Praxis       Pertinent Vitals/Pain Pain Assessment Pain Assessment: 0-10 Pain Score: 9  Pain Location: sternum and back Pain Descriptors / Indicators: Discomfort, Grimacing, Guarding Pain Intervention(s): Limited activity within patient's tolerance, Monitored during session (Pt reports it is almost time for his pain meds)     Hand Dominance     Extremity/Trunk Assessment Upper Extremity Assessment Upper Extremity Assessment: Generalized weakness   Lower Extremity Assessment RLE Deficits / Details: hx of TSA, very limited AROM, chronic pain at rest LLE Deficits / Details: hx of TSA, very limited AROM, chronic pain at rest   Cervical / Trunk Assessment Cervical / Trunk Exceptions: sternal precautions   Communication     Cognition Arousal/Alertness: Awake/alert Behavior During Therapy: WFL for tasks assessed/performed Overall Cognitive Status: Within Functional Limits for tasks assessed                                       General Comments  VSS on RA    Exercises     Shoulder Instructions      Home Living                                          Prior Functioning/Environment                          OT Problem List: Decreased strength;Decreased range of motion;Impaired balance (sitting and/or standing);Decreased activity tolerance;Decreased knowledge of precautions;Cardiopulmonary status limiting activity;Decreased safety awareness;Impaired UE functional use      OT Treatment/Interventions: Self-care/ADL training;Therapeutic exercise;Energy conservation;DME and/or AE instruction;Therapeutic activities;Balance training;Patient/family education    OT Goals(Current goals can be found in the care plan section) Acute Rehab OT Goals Patient Stated Goal: home OT Goal Formulation: With patient Time For Goal Achievement: 06/04/22 Potential to Achieve Goals: Good  OT Frequency: Min 2X/week    Co-evaluation              AM-PAC OT  "6 Clicks" Daily Activity     Outcome Measure Help from another person eating meals?: None Help from another person taking care of personal grooming?: A Hollerbach Help from another person toileting, which includes using toliet, bedpan, or urinal?: A Lot Help from another person bathing (including washing, rinsing, drying)?: A Lot Help from another person to put on and taking off regular upper body clothing?: A Lot Help from another person to put on and taking off regular lower body clothing?: A Lot 6 Click Score: 15   End of Session Nurse Communication: Mobility status  Activity Tolerance: Patient limited by fatigue (Patient requested to do room level ambulation d/t fatigue from walking around earlier and getting Sheils sleep) Patient left: in bed;with call bell/phone within reach;with family/visitor present  OT Visit Diagnosis: Unsteadiness on feet (R26.81);Other abnormalities of gait and mobility (R26.89);Muscle weakness (generalized) (M62.81)                Time: [id number] OT Time Calculation (min): 17 min Charges:  OT General Charges $OT Visit: 1 Visit OT Treatments $Therapeutic Activity: 8-22 mins  05/23/2022  AB, OTR/L  Acute Rehabilitation Services  Office: [place] 05/23/2022, 1:48 PM

## 2022-05-23 NOTE — Progress Notes (Signed)
Pacing wires pulled per order Wires intact vss. pt aware of bed rest till 1200.

## 2022-05-24 ENCOUNTER — Inpatient Hospital Stay (HOSPITAL_COMMUNITY): Payer: Medicare Other

## 2022-05-24 LAB — BASIC METABOLIC PANEL
Anion gap: 14 (ref 5–15)
BUN: 15 mg/dL (ref 8–23)
CO2: 22 mmol/L (ref 22–32)
Calcium: 9.1 mg/dL (ref 8.9–10.3)
Chloride: 100 mmol/L (ref 98–111)
Creatinine, Ser: 1.26 mg/dL — ABNORMAL HIGH (ref 0.61–1.24)
GFR, Estimated: >60 mL/min (ref >60)
Glucose, Bld: 114 mg/dL — ABNORMAL HIGH (ref 70–99)
Potassium: 4.3 mmol/L (ref 3.5–5.1)
Sodium: 136 mmol/L (ref 135–145)

## 2022-05-24 MED ORDER — FUROSEMIDE 40 MG PO TABS: 40.0000 mg | ORAL_TABLET | Freq: Every day | ORAL | Status: DC

## 2022-05-24 NOTE — Progress Notes (Signed)
CARDIAC REHAB PHASE I   PRE:  Rate/Rhythm: 73 NSR  BP:  Sitting: 108/71      SaO2: 95 RA  MODE:  Ambulation: 440 ft   AD:  RW  POST:  Rate/Rhythm: 106 ST  BP:  Sitting: 103/67      SaO2: 95 RA  Pt amb with standby and contact guard assistance, pt denies CP and SOB during amb and was returned to room w/o complaint.   Pt walked well, pt needed min-moderate assist transferring to EOB and min assist to stand. Pt reported some dizziness while walking. Pt was returned to bed and given water. Will continue to follow.   Christen Bame  9:50 AM 05/24/2022

## 2022-05-24 NOTE — Progress Notes (Signed)
Physical Therapy Treatment Patient Details Name: Victor [name] MRN: [id number] DOB: [birth year] Today's Date: 05/24/2022   History of Present Illness Pt is a 67 y.o. male who presented 05/19/22 for CABG x5. PMH: chronic back pain, anxiety, back sxs and cervical sxs. DDD, fibromyalgia, HTN, prediabetes, HLD, neuropathy bil feet, PVD sleep apnea, R TKA, BIL TSA, gout    PT Comments    Pt received in supine, sleeping but awoken easily, pt reoriented to time of day and benefits of mobility, pt agreeable to therapy session with encouragement. Pt able to demonstrate good tolerance for stair negotiation, encouraged to perform ~4-5 steps however pt then performed 11 steps, needing min guard for safety with cues for step sequencing to reduce fall risk/pain. Pt not very receptive to safety cueing or encouragement to sit up in chair, preferring to return to supine at end of session. Pt defers to eat lunch which had arrived prior to session. HR/BP WFL in supine. Pt continues to benefit from PT services to progress toward functional mobility goals.    Recommendations for follow up therapy are one component of a multi-disciplinary discharge planning process, led by the attending physician.  Recommendations may be updated based on patient status, additional functional criteria and insurance authorization.  Follow Up Recommendations       Assistance Recommended at Discharge Intermittent Supervision/Assistance  Patient can return home with the following A Monsour help with walking and/or transfers;A Mccarrell help with bathing/dressing/bathroom;Assistance with cooking/housework;Assist for transportation;Help with stairs or ramp for entrance   Equipment Recommendations  None recommended by PT    Recommendations for Other Services       Precautions / Restrictions Precautions Precautions: Fall;Sternal;Other (comment) Precaution Booklet Issued: No Precaution Comments: watch BP (low 3/23); bil shoulder  limitations (bil TSA hx) Restrictions Weight Bearing Restrictions: No Other Position/Activity Restrictions: sternal precautions (Move in the Tube)     Mobility  Bed Mobility Overal bed mobility: Needs Assistance Bed Mobility: Rolling, Sidelying to Sit, Sit to Supine Rolling: Supervision Sidelying to sit: Min guard   Sit to supine: HOB elevated, Supervision   General bed mobility comments: Pt uses HOB and rails to assist with sit>supine. Limited BUE ROM; pt winging lower UE elbow out slightly with bed mobility, pt cued for modification of technique to prevent incisional injury, Pt using heart pillow as well.    Transfers Overall transfer level: Needs assistance Equipment used: Rolling walker (2 wheels) Transfers: Sit to/from Stand Sit to Stand: Supervision           General transfer comment: from/to EOB, pt defers to sit in chair; wide BOS standing but good compliance with Move in the Tube precautions.    Ambulation/Gait Ambulation/Gait assistance: Supervision Gait Distance (Feet): 180 Feet Assistive device: Rolling walker (2 wheels) Gait Pattern/deviations: Step-through pattern, Decreased stride length, Trunk flexed       General Gait Details: verbal cues to decrease WBing on UEs, noted 3/4 fatigue and DOE, improves slightly with standing break; poor signal on fingers when checking SpO2, HR WFL 74 bpm resting and to 98 bpm with gait/stairs.   Stairs Stairs: Yes Stairs assistance: Min guard Stair Management: One rail Right, One rail Left, Alternating pattern, Step to pattern, Forwards Number of Stairs: 11 General stair comments: cues for step sequencing and proximity to rail to prevent increased stress on his chest incision, pt performs reciprocal pattern ascending with R rail and step-to pattern descending, pt cued not to perform reciprocal pattern descending due to increased instability/pain with this  technique when he ascended steps. Pt then appearing more flat when  given safety cues.   Wheelchair Mobility    Modified Rankin (Stroke Patients Only)       Balance Overall balance assessment: Mild deficits observed, not formally tested             Standing balance comment: appears steadier using RW                            Cognition Arousal/Alertness: Awake/alert Behavior During Therapy: WFL for tasks assessed/performed Overall Cognitive Status: Within Functional Limits for tasks assessed                                 General Comments: Pt states "I know what's going on in here" and appears frustrated toward end of session, unclear if pt having paranoia or is upset about some aspect of care. Pt participating in most tasks but appears frustrated when given cues for safer technique with functional mobility tasks.        Exercises      General Comments General comments (skin integrity, edema, etc.): HR 74 bpm resting and up to 98 bpm with stair ascent; SpO2 not reading on either hand with ambulation; BP 124/83 (95) supine post-exertion. Pt not receptive to encouragement to sit up in chair.      Pertinent Vitals/Pain Pain Assessment Pain Assessment: Faces Faces Pain Scale: Hurts even more Pain Location: sternum and back, some in BLE (L>R) Pain Descriptors / Indicators: Discomfort, Grimacing, Guarding Pain Intervention(s): Monitored during session, Repositioned, Patient requesting pain meds-RN notified           PT Goals (current goals can now be found in the care plan section) Acute Rehab PT Goals Patient Stated Goal: to improve PT Goal Formulation: With patient/family Time For Goal Achievement: 06/03/22 Progress towards PT goals: Progressing toward goals    Frequency    Min 1X/week      PT Plan Current plan remains appropriate       AM-PAC PT "6 Clicks" Mobility   Outcome Measure  Help needed turning from your back to your side while in a flat bed without using bedrails?: A Cronk Help  needed moving from lying on your back to sitting on the side of a flat bed without using bedrails?: A Murtha Help needed moving to and from a bed to a chair (including a wheelchair)?: A Butzin Help needed standing up from a chair using your arms (e.g., wheelchair or bedside chair)?: A Frigon Help needed to walk in hospital room?: A Evola Help needed climbing 3-5 steps with a railing? : A Roland 6 Click Score: 18    End of Session Equipment Utilized During Treatment: Gait belt Activity Tolerance: Patient tolerated treatment well;Patient limited by fatigue Patient left: in bed;with call bell/phone within reach;with bed alarm set;Other (comment) (pt refusing chair at this time) Nurse Communication: Mobility status;Patient requests pain meds PT Visit Diagnosis: Unsteadiness on feet (R26.81);Other abnormalities of gait and mobility (R26.89);Muscle weakness (generalized) (M62.81);Difficulty in walking, not elsewhere classified (R26.2)     Time: 1209-1228 PT Time Calculation (min) (ACUTE ONLY): 19 min  Charges:  $Gait Training: 8-22 mins                     Reily Ilic P., PTA Acute Rehabilitation Services Secure Chat Preferred 9a-5:30pm Office: [place]  Samayra Hebel 05/24/2022, 2:34 PM

## 2022-05-24 NOTE — Progress Notes (Signed)
Patient with yellow mews d/t heart and respiratory rate. Patient is in afib and is constantly getting up to ambulate to the bathroom. Patient has been given his schedule medication and he has agreed to wear a primofit to keep him from getting up during the night to urinate. Physicians are aware of rhythm change; orders were given to off-reporting RN, who treated patient as ordered. Will continue to monitor.

## 2022-05-24 NOTE — Progress Notes (Signed)
      [address]       [place] [address]             [phone]      5 Days Post-Op Procedure(s) (LRB): CORONARY ARTERY BYPASS GRAFTING (CABG) X FIVE BYPASSES USING OPEN LEFT INTERNAL MAMMARY ARTERY AND ENDOSCOPIC LEFT GREATER SAPHENOUS VEIN HARVEST (N/A) TRANSESOPHAGEAL ECHOCARDIOGRAM (N/A) Subjective: Awake and alert. Does not feel as well today.  In atrial fibrillation most of the night. Walked in the hall several times yesterday.  Objective: Vital signs in last 24 hours: Temp:  [97.9 F (36.6 C)-98.5 F (36.9 C)] 97.9 F (36.6 C) (03/27 0715) Pulse Rate:  [78-103] 78 (03/27 0715) Cardiac Rhythm: Atrial fibrillation (03/26 2048) Resp:  [13-22] 20 (03/27 0715) BP: (92-114)/(61-78) 108/71 (03/27 0715) SpO2:  [92 %-96 %] 96 % (03/27 0715)    Intake/Output from previous day: No intake/output data recorded. Intake/Output this shift: No intake/output data recorded.  General appearance: alert, cooperative, and mild distress Neurologic: intact Heart: a-fib most of last night. Converted back to SR this AM.  Lungs: clear breath sounds, normal WOB. Sats stable on RA CXR is OK. Abdomen: soft, no tenderness Extremities: left LE EVH incision intact and dry. Minimal LE edema  Expected bruising left thigh. Wound: The sternal incision well approximated and dry   Lab Results: Recent Labs    05/23/22 0621  WBC 6.3  HGB 9.0*  HCT 27.8*  PLT 188    BMET:  Recent Labs    05/23/22 0621 05/24/22 0128  NA 134* 136  K 5.2* 4.3  CL 98 100  CO2 25 22  GLUCOSE 112* 114*  BUN 13 15  CREATININE 1.16 1.26*  CALCIUM 8.9 9.1     PT/INR: No results for input(s): "LABPROT", "INR" in the last 72 hours. ABG    Component Value Date/Time   PHART 7.364 05/19/2022 1839   HCO3 24.0 05/19/2022 1839   TCO2 25 05/19/2022 1839   ACIDBASEDEF 1.0 05/19/2022 1839   O2SAT 95 05/19/2022 1839   CBG (last 3)  Recent Labs    05/21/22 2342 05/22/22 0333  05/22/22 0840  GLUCAP 92 76 105*     Assessment/Plan: S/P Procedure(s) (LRB): CORONARY ARTERY BYPASS GRAFTING (CABG) X FIVE BYPASSES USING OPEN LEFT INTERNAL MAMMARY ARTERY AND ENDOSCOPIC LEFT GREATER SAPHENOUS VEIN HARVEST (N/A) TRANSESOPHAGEAL ECHOCARDIOGRAM (N/A)  -POD5 CABG x 5, normal EF. On ASA and metoprolol. Intolerant of statins. Walked several times in the hall with his walker yesterday  -Post-op atrial fibrillation- recurrent a-fib most of last night in the 120's.Back in SR now. Continue on oral amiodarone.  K+4.3  -Chronic pain- says control is acceptable on his usual regimen.    -GI- tolerating PO's, BM's last night and this morning  -HEME- expected acute blood loss anemia.   -Volume excess- Wt 1.5 + if accurate, Creat trending up. Will hold off on further diuresis.   -Disposition- Planning for discharge to home in next 1-2 days with home health nursing and home OT. Need to assure rhythm ane renal function are stable before discharge.    LOS: 5 days    Antony Odea, [place] [phone] 05/24/2022

## 2022-05-24 NOTE — TOC Initial Note (Signed)
Transition of Care (TOC) - Initial/Assessment Note  Marvetta Gibbons RN, BSN Transitions of Care Unit 4E- RN Case Manager See Treatment Team for direct phone #   Patient Details  Name: Victor [name] MRN: [id number] Date of Birth: [birth year]  Transition of Care [facility]) CM/SW Contact:    Dawayne Patricia, RN Phone Number: 05/24/2022, 12:59 PM  Clinical Narrative:                 Pt s/p CABG from home w/ wife, orders placed for HHPT/OT per therapy recommendations.   Spoke with [employer] liaison and they are following patient with MD office protocol referral prearranged for [facility] needs  CM spoke with pt at bedside- pt voiced he would like to speak with his wife first before committing to [facility] services. Explained to pt that [facility] has accepted referral under office protocol with [facility] and can follow for [facility] needs PT/OT.  Pt voiced he has RW "somewhere" at home that he can use- does not believe he will need one and no other DME needs identified.   Latricia Heft will follow for [facility], pt agreeable to speak with them when they contact him.    Expected Discharge Plan: [place] Barriers to Discharge: No Barriers Identified   Patient Goals and CMS Choice Patient states their goals for this hospitalization and ongoing recovery are:: return home CMS Medicare.gov Compare Post Acute Care list provided to:: Patient Choice offered to / list presented to : Patient      Expected Discharge Plan and Services   Discharge Planning Services: CM Consult Post Acute Care Choice: [place] arrangements for the past 2 months: Single Family Home                           HH Arranged: PT, OT HH Agency: [place] Date [facility] Agency Contacted: 05/24/22 Time HH Agency Contacted: 1000 Representative spoke with at [place]  Prior Living Arrangements/Services Living arrangements for the past 2 months: [place] with:: Spouse Patient language and  need for interpreter reviewed:: Yes Do you feel safe going back to the place where you live?: Yes      Need for Family Participation in Patient Care: Yes (Comment) Care giver support system in place?: Yes (comment)   Criminal Activity/Legal Involvement Pertinent to Current Situation/Hospitalization: No - Comment as needed  Activities of Daily Living Home Assistive Devices/Equipment: Cane (specify quad or straight), Walker (specify type), Blood pressure cuff, Bedside commode/3-in-1, Hand-held shower hose, Shower chair with back ADL Screening (condition at time of admission) Does the patient have difficulty walking or climbing stairs?: Yes Weakness of Legs: Left Weakness of Arms/Hands: Both  Permission Sought/Granted Permission sought to share information with : Facility Art therapist granted to share information with : Yes, Verbal Permission Granted     Permission granted to share info w AGENCY: HH        Emotional Assessment Appearance:: Appears stated age Attitude/Demeanor/Rapport: Engaged Affect (typically observed): Appropriate Orientation: : Oriented to Self, Oriented to Place, Oriented to  Time, Oriented to Situation Alcohol / Substance Use: Not Applicable Psych Involvement: No (comment)  Admission diagnosis:  S/P CABG x 5 [Z95.1] Patient Active Problem List   Diagnosis Date Noted   S/P CABG x 5 05/19/2022   Coronary artery disease involving native coronary artery of native heart with angina pectoris ([place]) 02/03/2022   Degenerative spondylolisthesis 05/31/2021   Cervical  radiculopathy 05/24/2020   Hypertension    Neuropathy of both feet    Lumbar foraminal stenosis 08/06/2017   HNP (herniated nucleus pulposus), thoracic 09/05/2016   Chronic kidney disease (CKD) 04/26/2016   DJD (degenerative joint disease), cervical 04/26/2016   Essential hypertension 04/26/2016   Hyperlipidemia 04/26/2016   Sleep apnea 04/26/2016   Fibromyalgia 04/18/2016    Idiopathic chronic gout of multiple sites without tophus 04/18/2016   Spondylosis of lumbar region without myelopathy or radiculopathy 04/18/2016   Primary osteoarthritis of both knees 04/18/2016   Lumbar herniated disc 07/22/2015   Morbid obesity ([place]) 02/25/2014   S/P right TKA 02/24/2014   Heartburn 01/10/2011   PCP:  Antony Contras, MD Pharmacy:   [id number] - [place], [place] - [address] [place] [address] Phone: [phone] Fax: [place], [place] - [phone] [place] [address] Phone: [phone] Fax: [phone]     Social Determinants of Health (SDOH) Social History: SDOH Screenings   Food Insecurity: No Food Insecurity (05/21/2022)  Housing: Low Risk  (05/21/2022)  Transportation Needs: No Transportation Needs (05/21/2022)  Utilities: Not At Risk (05/21/2022)  Tobacco Use: Medium Risk (05/20/2022)   SDOH Interventions:     Readmission Risk Interventions     No data to display

## 2022-05-25 ENCOUNTER — Other Ambulatory Visit (HOSPITAL_COMMUNITY): Payer: Self-pay

## 2022-05-25 LAB — BASIC METABOLIC PANEL
Anion gap: 7 (ref 5–15)
BUN: 15 mg/dL (ref 8–23)
CO2: 26 mmol/L (ref 22–32)
Calcium: 8.7 mg/dL — ABNORMAL LOW (ref 8.9–10.3)
Chloride: 101 mmol/L (ref 98–111)
Creatinine, Ser: 1.21 mg/dL (ref 0.61–1.24)
GFR, Estimated: >60 mL/min (ref >60)
Glucose, Bld: 98 mg/dL (ref 70–99)
Potassium: 4 mmol/L (ref 3.5–5.1)
Sodium: 134 mmol/L — ABNORMAL LOW (ref 135–145)

## 2022-05-25 MED ORDER — METOPROLOL TARTRATE 25 MG PO TABS
25.0000 mg | ORAL_TABLET | Freq: Two times a day (BID) | ORAL | 5 refills | Status: DC
  Filled 2022-05-25: qty 180, 90d supply, fill #0

## 2022-05-25 MED ORDER — AMIODARONE HCL 200 MG PO TABS
400.0000 mg | ORAL_TABLET | Freq: Two times a day (BID) | ORAL | 1 refills | Status: DC
  Filled 2022-05-25: qty 120, 30d supply, fill #0

## 2022-05-25 MED ORDER — ASPIRIN 325 MG PO TBEC
325.0000 mg | DELAYED_RELEASE_TABLET | Freq: Every day | ORAL | 3 refills | Status: DC
  Filled 2022-05-25: qty 100, 100d supply, fill #0

## 2022-05-25 MED FILL — Magnesium Sulfate Inj 50%: INTRAMUSCULAR | Qty: 10 | Status: CN

## 2022-05-25 MED FILL — Potassium Chloride Inj 2 mEq/ML: INTRAVENOUS | Qty: 20 | Status: CN

## 2022-05-25 MED FILL — Heparin Sodium (Porcine) Inj 1000 Unit/ML: Qty: 1000 | Status: AC

## 2022-05-25 MED FILL — Heparin Sodium (Porcine) Inj 1000 Unit/ML: Qty: 1000 | Status: CN

## 2022-05-25 MED FILL — Potassium Chloride Inj 2 mEq/ML: INTRAVENOUS | Qty: 20 | Status: AC

## 2022-05-25 MED FILL — Lidocaine HCl Local Preservative Free (PF) Inj 2%: INTRAMUSCULAR | Qty: 14 | Status: AC

## 2022-05-25 NOTE — Plan of Care (Signed)
Discharge to home in progress.

## 2022-05-25 NOTE — TOC Transition Note (Addendum)
Transition of Care [facility]) - CM/SW Discharge Note   Patient Details  Name: Victor [name] MRN: [id number] Date of Birth: 1955-06-05  Transition of Care [facility]) CM/SW Contact:  Tom-Johnson, Renea Ee, RN Phone Number: 05/25/2022, 10:37 AM   Clinical Narrative:     Patient is scheduled for discharge today. Readmission Prevention Assessment done.  Home health info on AVS.  Outpatient referral, hospital f/u and discharge instructions on AVS.  Prescription sent to [facility] and meds to be delivered to patient at bedside prior to discharge.  Family to transport at discharge. No further TOC needs noted.   Final next level of care: Home w Home Health Services Barriers to Discharge: Barriers Resolved   Patient Goals and CMS Choice CMS Medicare.gov Compare Post Acute Care list provided to:: Patient Choice offered to / list presented to : Patient  Discharge Placement                  Patient to be transferred to facility by: Family      Discharge Plan and Services Additional resources added to the After Visit Summary for     Discharge Planning Services: CM Consult Post Acute Care Choice: Home Health          DME Arranged: N/A DME Agency: NA       HH Arranged: PT, OT HH Agency: [place] Date [place]: 05/24/22 Time HH Agency Contacted: 1000 Representative spoke with at [place]: [place] Determinants of Health ([place]) Interventions SDOH Screenings   Food Insecurity: No Food Insecurity (05/21/2022)  Housing: Low Risk  (05/21/2022)  Transportation Needs: No Transportation Needs (05/21/2022)  Utilities: Not At Risk (05/21/2022)  Tobacco Use: Medium Risk (05/20/2022)     Readmission Risk Interventions    05/25/2022   10:37 AM  Readmission Risk Prevention Plan  Post Dischage Appt Complete  Medication Screening Complete  Transportation Screening Complete

## 2022-05-25 NOTE — Progress Notes (Signed)
Pt able to recall restrictions and sternal precautions before d/c.

## 2022-05-25 NOTE — Progress Notes (Signed)
Occupational Therapy Treatment Patient Details Name: Victor [name] MRN: [id number] DOB: 1955/03/28 Today's Date: 05/25/2022   History of present illness Pt is a 67 y.o. male who presented 05/19/22 for CABG x5. PMH: chronic back pain, anxiety, back sxs and cervical sxs. DDD, fibromyalgia, HTN, prediabetes, HLD, neuropathy bil feet, PVD sleep apnea, R TKA, BIL TSA, gout   OT comments  Pt continuing to progress towards patient focused goals. Pt completing room level ambulation with Supervision and dressing with Min A to Supervision, expressing moderate levels of fatigue after completing dressing and toileting. Patient recalls and demos good carryover of sternal precautions but did require 1 verbal cue to avoid using UE to assist with lift off for sit>stand. Patient would continue to benefit from continue acute skilled OT services to address above deficits and help transition to next level of care. Pt  remains appropriate for Home OT services to help maximize functional independence in their natural environment.    Recommendations for follow up therapy are one component of a multi-disciplinary discharge planning process, led by the attending physician.  Recommendations may be updated based on patient status, additional functional criteria and insurance authorization.    Assistance Recommended at Discharge Intermittent Supervision/Assistance  Patient can return home with the following  A Graham help with walking and/or transfers;A Poirier help with bathing/dressing/bathroom;Assistance with cooking/housework;Assist for transportation;Help with stairs or ramp for entrance   Equipment Recommendations  None recommended by OT    Recommendations for Other Services      Precautions / Restrictions Precautions Precautions: Fall;Sternal;Other (comment) Precaution Booklet Issued: No Precaution Comments: watch BP (low 3/23); bil shoulder limitations (bil TSA hx) Restrictions Weight Bearing  Restrictions: Yes RUE Weight Bearing: Non weight bearing LUE Weight Bearing: Non weight bearing Other Position/Activity Restrictions: sternal precautions (Move in the Tube)       Mobility Bed Mobility Overal bed mobility: Needs Assistance         Sit to supine: Min assist, HOB elevated   General bed mobility comments: Pt using heart pillow but was not fully capable of independent supine>sit, discussed using momentum to aid with bed mobility.    Transfers Overall transfer level: Needs assistance Equipment used: None Transfers: Sit to/from Stand Sit to Stand: Supervision           General transfer comment: Cued patient to avoid pushing off UEs with sit>stand. Pt able to complete 3x sit>stands while maintaing precautions     Balance Overall balance assessment: Mild deficits observed, not formally tested   Sitting balance-Leahy Scale: Good       Standing balance-Leahy Scale: Good                             ADL either performed or assessed with clinical judgement   ADL Overall ADL's : Needs assistance/impaired                 Upper Body Dressing : Standing;Minimal assistance Upper Body Dressing Details (indicate cue type and reason): Pt donned shirt and buttoned shirt Min A. Supervision for standing balance Lower Body Dressing: Sitting/lateral leans;Supervision/safety Lower Body Dressing Details (indicate cue type and reason): Pt able to doff/don socks, shoes and shorts Supervision while sitting. Total assist to lace tennis shoes Toilet Transfer: Supervision/safety;Ambulation Toilet Transfer Details (indicate cue type and reason): no AD Toileting- Clothing Manipulation and Hygiene: Supervision/safety;Sit to/from stand       Functional mobility during ADLs: Supervision/safety  Extremity/Trunk Assessment              Vision       Perception     Praxis      Cognition Arousal/Alertness: Awake/alert Behavior During Therapy: WFL  for tasks assessed/performed Overall Cognitive Status: Within Functional Limits for tasks assessed                                          Exercises      Shoulder Instructions       General Comments Pt disconnnect from Tele upon arrival. Pt noted moderate fatigue at end of session following dressing    Pertinent Vitals/ Pain       Pain Assessment Pain Assessment: No/denies pain  Home Living                                          Prior Functioning/Environment              Frequency  Min 2X/week        Progress Toward Goals  OT Goals(current goals can now be found in the care plan section)  Progress towards OT goals: Progressing toward goals  Acute Rehab OT Goals Patient Stated Goal: home OT Goal Formulation: With patient Time For Goal Achievement: 06/04/22 Potential to Achieve Goals: Good  Plan Discharge plan remains appropriate;Frequency remains appropriate    Co-evaluation                 AM-PAC OT "6 Clicks" Daily Activity     Outcome Measure   Help from another person eating meals?: None Help from another person taking care of personal grooming?: A Hottinger Help from another person toileting, which includes using toliet, bedpan, or urinal?: A Kier Help from another person bathing (including washing, rinsing, drying)?: A Lot Help from another person to put on and taking off regular upper body clothing?: A Morris Help from another person to put on and taking off regular lower body clothing?: A Blackwelder 6 Click Score: 18    End of Session    OT Visit Diagnosis: Unsteadiness on feet (R26.81);Other abnormalities of gait and mobility (R26.89);Muscle weakness (generalized) (M62.81)   Activity Tolerance Patient tolerated treatment well   Patient Left in bed;with call bell/phone within reach   Nurse Communication Mobility status        Time: [id number] OT Time Calculation (min): 36 min  Charges: OT  General Charges $OT Visit: 1 Visit OT Treatments $Self Care/Home Management : 23-37 mins  05/25/2022  AB, OTR/L  Acute Rehabilitation Services  Office: [phone]   Cori Razor 05/25/2022, 11:01 AM

## 2022-05-25 NOTE — Progress Notes (Signed)
      [address]       [place] [address]             [phone]      6 Days Post-Op Procedure(s) (LRB): CORONARY ARTERY BYPASS GRAFTING (CABG) X FIVE BYPASSES USING OPEN LEFT INTERNAL MAMMARY ARTERY AND ENDOSCOPIC LEFT GREATER SAPHENOUS VEIN HARVEST (N/A) TRANSESOPHAGEAL ECHOCARDIOGRAM (N/A) Subjective: Awake and alert.  Walked in the hall earlier this morning without his walker and is having some back discomfort.  Feels like he is ready to go home.   Objective: Vital signs in last 24 hours: Temp:  [97.6 F (36.4 C)-98.5 F (36.9 C)] 98.2 F (36.8 C) (03/28 0425) Pulse Rate:  [71-81] 77 (03/28 0425) Cardiac Rhythm: Normal sinus rhythm (03/28 0325) Resp:  [14-18] 17 (03/28 0425) BP: (99-132)/(39-83) 127/76 (03/28 0425) SpO2:  [94 %-99 %] 99 % (03/28 0425) Weight:  [103.4 kg] 103.4 kg (03/28 0425)    Intake/Output from previous day: 03/27 0701 - 03/28 0700 In: 250 [P.O.:250] Out: -  Intake/Output this shift: No intake/output data recorded.  General appearance: alert, cooperative, and mild distress Neurologic: intact Heart:  Stable SR since yesterday morning.   Lungs: normal WOB. Sats stable on RA CXR is OK. Abdomen: soft, no tenderness Extremities: left LE EVH incision intact and dry. Minimal LE edema  Expected bruising left thigh. Wound: The sternal incision well approximated and dry   Lab Results: Recent Labs    05/23/22 0621  WBC 6.3  HGB 9.0*  HCT 27.8*  PLT 188    BMET:  Recent Labs    05/24/22 0128 05/25/22 0052  NA 136 134*  K 4.3 4.0  CL 100 101  CO2 22 26  GLUCOSE 114* 98  BUN 15 15  CREATININE 1.26* 1.21  CALCIUM 9.1 8.7*     PT/INR: No results for input(s): "LABPROT", "INR" in the last 72 hours. ABG    Component Value Date/Time   PHART 7.364 05/19/2022 1839   HCO3 24.0 05/19/2022 1839   TCO2 25 05/19/2022 1839   ACIDBASEDEF 1.0 05/19/2022 1839   O2SAT 95 05/19/2022 1839   CBG (last 3)  Recent Labs     05/22/22 0840  GLUCAP 105*     Assessment/Plan: S/P Procedure(s) (LRB): CORONARY ARTERY BYPASS GRAFTING (CABG) X FIVE BYPASSES USING OPEN LEFT INTERNAL MAMMARY ARTERY AND ENDOSCOPIC LEFT GREATER SAPHENOUS VEIN HARVEST (N/A) TRANSESOPHAGEAL ECHOCARDIOGRAM (N/A)  -POD6 CABG x 5, normal EF. On ASA and metoprolol. Intolerant of statins. Walking several times daily in the hall  -Post-op atrial fibrillation- Stable SR past 24 hours. Continue on oral amiodarone.  K+4.0  -Chronic pain- says control is acceptable on his usual regimen.    -GI- tolerating PO's, appropriate bowel function  -HEME- expected acute blood loss anemia.   -Volume excess- Wt now below pre-op accurate, Creat trending back down.    -Disposition- Discharge to home today with home health nursing and home OT.  Follow up arranged, instructions given.   LOS: 6 days    Antony Odea, [place] [phone] 05/25/2022

## 2022-05-25 NOTE — Progress Notes (Signed)
Patient said that his wife will be here to pick him up around 11 am.

## 2022-05-25 NOTE — Progress Notes (Signed)
Pt is alert and fully oriented x 4, stable hemodynamically, NSR on the monitor, HR 70s, BP stable. Pt ambulates independently in hallway . Denied SOB. Pt sleeps well tonight. No acute distress. His sternal incision is dry and clean. No drainage. We paint with Betadine. His right and left leg incisions are clean and dry. Pian is well tolerated with Oxycodone 10 mg at bedtime. We will continue to monitor.   Kennyth Lose, RN

## 2022-05-25 NOTE — Plan of Care (Signed)
  Problem: Activity: Goal: Risk for activity intolerance will decrease Outcome: Progressing   Problem: Cardiac: Goal: Will achieve and/or maintain hemodynamic stability Outcome: Progressing   Problem: Clinical Measurements: Goal: Postoperative complications will be avoided or minimized Outcome: Progressing   Problem: Skin Integrity: Goal: Wound healing without signs and symptoms of infection Outcome: Progressing Goal: Risk for impaired skin integrity will decrease Outcome: Progressing   Problem: Urinary Elimination: Goal: Ability to achieve and maintain adequate renal perfusion and functioning will improve Outcome: Progressing   Problem: Clinical Measurements: Goal: Will remain free from infection Outcome: Progressing   Problem: Activity: Goal: Risk for activity intolerance will decrease Outcome: Progressing   Problem: Nutrition: Goal: Adequate nutrition will be maintained Outcome: Progressing   Problem: Coping: Goal: Level of anxiety will decrease Outcome: Progressing   Problem: Elimination: Goal: Will not experience complications related to bowel motility Outcome: Progressing Goal: Will not experience complications related to urinary retention Outcome: Progressing   Problem: Pain Managment: Goal: General experience of comfort will improve Outcome: Progressing

## 2022-05-27 DIAGNOSIS — Z951 Presence of aortocoronary bypass graft: Secondary | ICD-10-CM | POA: Diagnosis not present

## 2022-05-27 DIAGNOSIS — I25118 Atherosclerotic heart disease of native coronary artery with other forms of angina pectoris: Secondary | ICD-10-CM | POA: Diagnosis not present

## 2022-05-27 DIAGNOSIS — I739 Peripheral vascular disease, unspecified: Secondary | ICD-10-CM | POA: Diagnosis not present

## 2022-05-27 DIAGNOSIS — G4733 Obstructive sleep apnea (adult) (pediatric): Secondary | ICD-10-CM | POA: Diagnosis not present

## 2022-05-27 DIAGNOSIS — N1831 Chronic kidney disease, stage 3a: Secondary | ICD-10-CM | POA: Diagnosis not present

## 2022-05-27 DIAGNOSIS — Z48812 Encounter for surgical aftercare following surgery on the circulatory system: Secondary | ICD-10-CM | POA: Diagnosis not present

## 2022-05-27 DIAGNOSIS — E785 Hyperlipidemia, unspecified: Secondary | ICD-10-CM | POA: Diagnosis not present

## 2022-05-27 DIAGNOSIS — I129 Hypertensive chronic kidney disease with stage 1 through stage 4 chronic kidney disease, or unspecified chronic kidney disease: Secondary | ICD-10-CM | POA: Diagnosis not present

## 2022-05-30 DIAGNOSIS — Z48812 Encounter for surgical aftercare following surgery on the circulatory system: Secondary | ICD-10-CM | POA: Diagnosis not present

## 2022-05-30 DIAGNOSIS — I739 Peripheral vascular disease, unspecified: Secondary | ICD-10-CM | POA: Diagnosis not present

## 2022-05-30 DIAGNOSIS — G4733 Obstructive sleep apnea (adult) (pediatric): Secondary | ICD-10-CM | POA: Diagnosis not present

## 2022-05-30 DIAGNOSIS — N1831 Chronic kidney disease, stage 3a: Secondary | ICD-10-CM | POA: Diagnosis not present

## 2022-05-30 DIAGNOSIS — I129 Hypertensive chronic kidney disease with stage 1 through stage 4 chronic kidney disease, or unspecified chronic kidney disease: Secondary | ICD-10-CM | POA: Diagnosis not present

## 2022-05-30 DIAGNOSIS — E785 Hyperlipidemia, unspecified: Secondary | ICD-10-CM | POA: Diagnosis not present

## 2022-05-30 DIAGNOSIS — Z951 Presence of aortocoronary bypass graft: Secondary | ICD-10-CM | POA: Diagnosis not present

## 2022-05-30 DIAGNOSIS — I25118 Atherosclerotic heart disease of native coronary artery with other forms of angina pectoris: Secondary | ICD-10-CM | POA: Diagnosis not present

## 2022-05-31 MED FILL — Heparin Sodium (Porcine) Inj 1000 Unit/ML: INTRAMUSCULAR | Qty: 10 | Status: AC

## 2022-05-31 MED FILL — Sodium Bicarbonate IV Soln 8.4%: INTRAVENOUS | Qty: 50 | Status: AC

## 2022-05-31 MED FILL — Electrolyte-R (PH 7.4) Solution: INTRAVENOUS | Qty: 5000 | Status: AC

## 2022-05-31 MED FILL — Lidocaine HCl Local Soln Prefilled Syringe 100 MG/5ML (2%): INTRAMUSCULAR | Qty: 5 | Status: AC

## 2022-05-31 MED FILL — Mannitol IV Soln 20%: INTRAVENOUS | Qty: 250 | Status: AC

## 2022-05-31 MED FILL — Sodium Chloride IV Soln 0.9%: INTRAVENOUS | Qty: 2000 | Status: AC

## 2022-06-02 ENCOUNTER — Ambulatory Visit: Payer: Medicare Other | Admitting: *Deleted

## 2022-06-02 DIAGNOSIS — Z951 Presence of aortocoronary bypass graft: Secondary | ICD-10-CM | POA: Diagnosis not present

## 2022-06-02 DIAGNOSIS — I129 Hypertensive chronic kidney disease with stage 1 through stage 4 chronic kidney disease, or unspecified chronic kidney disease: Secondary | ICD-10-CM | POA: Diagnosis not present

## 2022-06-02 DIAGNOSIS — E785 Hyperlipidemia, unspecified: Secondary | ICD-10-CM | POA: Diagnosis not present

## 2022-06-02 DIAGNOSIS — Z4802 Encounter for removal of sutures: Secondary | ICD-10-CM

## 2022-06-02 DIAGNOSIS — Z48812 Encounter for surgical aftercare following surgery on the circulatory system: Secondary | ICD-10-CM | POA: Diagnosis not present

## 2022-06-02 DIAGNOSIS — I25118 Atherosclerotic heart disease of native coronary artery with other forms of angina pectoris: Secondary | ICD-10-CM | POA: Diagnosis not present

## 2022-06-02 DIAGNOSIS — N1831 Chronic kidney disease, stage 3a: Secondary | ICD-10-CM | POA: Diagnosis not present

## 2022-06-02 DIAGNOSIS — G4733 Obstructive sleep apnea (adult) (pediatric): Secondary | ICD-10-CM | POA: Diagnosis not present

## 2022-06-02 DIAGNOSIS — I739 Peripheral vascular disease, unspecified: Secondary | ICD-10-CM | POA: Diagnosis not present

## 2022-06-02 NOTE — Progress Notes (Signed)
Patient arrived for nurse visit to remove sutures post-CABG 3/22 by Dr. Renato Battles.  Three sutures removed with no signs or symptoms of infection noted.  Incisions well approximated. Patient tolerated suture removal well.  Patient and family instructed to keep the incision site clean and dry. Patient and family acknowledged instructions given.  All questions answered.

## 2022-06-06 DIAGNOSIS — E785 Hyperlipidemia, unspecified: Secondary | ICD-10-CM | POA: Diagnosis not present

## 2022-06-06 DIAGNOSIS — I129 Hypertensive chronic kidney disease with stage 1 through stage 4 chronic kidney disease, or unspecified chronic kidney disease: Secondary | ICD-10-CM | POA: Diagnosis not present

## 2022-06-06 DIAGNOSIS — I25118 Atherosclerotic heart disease of native coronary artery with other forms of angina pectoris: Secondary | ICD-10-CM | POA: Diagnosis not present

## 2022-06-06 DIAGNOSIS — Z951 Presence of aortocoronary bypass graft: Secondary | ICD-10-CM | POA: Diagnosis not present

## 2022-06-06 DIAGNOSIS — G4733 Obstructive sleep apnea (adult) (pediatric): Secondary | ICD-10-CM | POA: Diagnosis not present

## 2022-06-06 DIAGNOSIS — Z48812 Encounter for surgical aftercare following surgery on the circulatory system: Secondary | ICD-10-CM | POA: Diagnosis not present

## 2022-06-06 DIAGNOSIS — N1831 Chronic kidney disease, stage 3a: Secondary | ICD-10-CM | POA: Diagnosis not present

## 2022-06-06 DIAGNOSIS — I739 Peripheral vascular disease, unspecified: Secondary | ICD-10-CM | POA: Diagnosis not present

## 2022-06-08 DIAGNOSIS — I25118 Atherosclerotic heart disease of native coronary artery with other forms of angina pectoris: Secondary | ICD-10-CM | POA: Diagnosis not present

## 2022-06-08 DIAGNOSIS — I129 Hypertensive chronic kidney disease with stage 1 through stage 4 chronic kidney disease, or unspecified chronic kidney disease: Secondary | ICD-10-CM | POA: Diagnosis not present

## 2022-06-08 DIAGNOSIS — N1831 Chronic kidney disease, stage 3a: Secondary | ICD-10-CM | POA: Diagnosis not present

## 2022-06-08 DIAGNOSIS — G4733 Obstructive sleep apnea (adult) (pediatric): Secondary | ICD-10-CM | POA: Diagnosis not present

## 2022-06-08 DIAGNOSIS — Z48812 Encounter for surgical aftercare following surgery on the circulatory system: Secondary | ICD-10-CM | POA: Diagnosis not present

## 2022-06-08 DIAGNOSIS — Z951 Presence of aortocoronary bypass graft: Secondary | ICD-10-CM | POA: Diagnosis not present

## 2022-06-08 DIAGNOSIS — E785 Hyperlipidemia, unspecified: Secondary | ICD-10-CM | POA: Diagnosis not present

## 2022-06-08 DIAGNOSIS — I739 Peripheral vascular disease, unspecified: Secondary | ICD-10-CM | POA: Diagnosis not present

## 2022-06-13 ENCOUNTER — Other Ambulatory Visit: Payer: Self-pay | Admitting: Surgery

## 2022-06-13 DIAGNOSIS — Z951 Presence of aortocoronary bypass graft: Secondary | ICD-10-CM

## 2022-06-14 ENCOUNTER — Encounter: Payer: Self-pay | Admitting: Surgery

## 2022-06-14 ENCOUNTER — Ambulatory Visit
Admission: RE | Admit: 2022-06-14 | Discharge: 2022-06-14 | Disposition: A | Discharge status: Home / Self Care | Payer: Medicare Other | Source: Ambulatory Visit | Attending: Surgery | Admitting: Surgery

## 2022-06-14 ENCOUNTER — Ambulatory Visit (INDEPENDENT_AMBULATORY_CARE_PROVIDER_SITE_OTHER): Payer: Self-pay | Admitting: Surgery

## 2022-06-14 VITALS — BP 124/85 | HR 69 | Resp 20 | Ht 68.0 in | Wt 223.0 lb

## 2022-06-14 DIAGNOSIS — Z951 Presence of aortocoronary bypass graft: Secondary | ICD-10-CM

## 2022-06-14 DIAGNOSIS — J9811 Atelectasis: Secondary | ICD-10-CM | POA: Diagnosis not present

## 2022-06-14 NOTE — Progress Notes (Signed)
HPI: Patient returns for routine postoperative follow-up having undergone coronary artery bypass graft surgery x 5 on 05/19/2022. The patient's early postoperative recovery while in the hospital was notable for development of postoperative atrial fibrillation converted with amiodarone. Since hospital discharge the patient reports that he has been feeling well.  He is walking daily without chest pain or shortness of breath.   Current Outpatient Medications  Medication Sig Dispense Refill   acetaminophen (TYLENOL) 500 MG tablet Take 500-1,000 mg by mouth every 8 (eight) hours as needed for moderate pain.     allopurinol (ZYLOPRIM) 300 MG tablet TAKE 1 AND 1/2 TABLETS BY MOUTH DAILY 135 tablet 0   ALPRAZolam (XANAX) 1 MG tablet Take 1 mg by mouth 2 (two) times daily as needed for anxiety.     amiodarone (PACERONE) 200 MG tablet Take 2 tablets (400 mg total) by mouth 2 (two) times daily foor 7 days, then decrease the dose to 1 tablet ( ) by mouth twice daily. 60 tablet 1   aspirin EC 325 MG tablet Take 1 tablet (325 mg total) by mouth daily. 100 tablet 3   b complex vitamins tablet Take 1 tablet by mouth in the morning.     Biotin 5000 MCG CAPS Take 5,000 mcg by mouth in the morning.     Calcium Carbonate (CALCIUM 600 PO) Take 600 mg by mouth in the morning.     carboxymethylcellulose (REFRESH PLUS) 0.5 % SOLN Place 1 drop into both eyes 2 (two) times daily as needed (Dry eyes).     colchicine 0.6 MG tablet TAKE 1 TABLET (0.6 MG TOTAL) BY MOUTH 2 (TWO) TIMES DAILY AS NEEDED. 180 tablet 0   cyclobenzaprine (FLEXERIL) 10 MG tablet Take 10 mg by mouth 2 (two) times daily as needed for muscle spasms.     Evolocumab (REPATHA SURECLICK) 140 MG/ML SOAJ Inject 140 mg into the skin every 14 (fourteen) days. 2 mL 11   fish oil-omega-3 fatty acids 1000 MG capsule Take 1 g by mouth in the morning and at bedtime.     fluticasone (FLONASE) 50 MCG/ACT nasal spray Place 1 spray into both nostrils 2 (two)  times daily. For congestion     GINSENG PO Take 400 mg by mouth in the morning.     guaiFENesin (MUCINEX) 600 MG 12 hr tablet Take 600 mg by mouth at bedtime as needed (Congestion).     hydrocortisone cream 1 % Apply 1 application topically daily as needed for itching.     loratadine (CLARITIN) 10 MG tablet Take 10 mg by mouth daily.     magnesium gluconate (MAGONATE) 500 MG tablet Take 500 mg by mouth in the morning.     Melatonin 10 MG TABS Take 10 mg by mouth at bedtime as needed (sleep).     Methylsulfonylmethane (MSM) 1000 MG CAPS Take 1,000 mg by mouth in the morning.     metoprolol tartrate (LOPRESSOR) 25 MG tablet Take 1 tablet (25 mg total) by mouth 2 (two) times daily. 60 tablet 5   Multiple Vitamins-Minerals (MULTIVITAMIN WITH MINERALS) tablet Take 1 tablet by mouth daily.     nitroGLYCERIN (NITROSTAT) 0.4 MG SL tablet Dissolve 1 tablet under the tongue every 5 minutes as needed for chest pain. Max of 3 doses, then 911. 25 tablet 6   NONFORMULARY OR COMPOUNDED ITEM Apply 1 g topically daily as needed (foot discomfort/pain.). Doxepin 5%, Amantadine 3%, Pentoxifylline 3%, Lamotrigine 2%, Sertraline 3%, Gabapentin 6% PCCA Lipoderm Base Cream  omeprazole (PRILOSEC) 40 MG capsule Take 40 mg by mouth in the morning.     Oxycodone HCl 10 MG TABS Take by mouth.     Psyllium (METAMUCIL FIBER PO) Take 1 Scoop by mouth daily in the afternoon.     selenium 200 MCG TABS tablet Take 200 mcg by mouth in the morning.     sildenafil (VIAGRA) 100 MG tablet Take 100 mg by mouth daily as needed for erectile dysfunction. Per patient taking 1/2 tablet (50 mg) as needed     silodosin (RAPAFLO) 8 MG CAPS capsule Take 8 mg by mouth at bedtime.  3   sodium chloride (OCEAN) 0.65 % SOLN nasal spray Place 1 spray into both nostrils as needed for congestion.     testosterone cypionate (DEPOTESTOTERONE CYPIONATE) 200 MG/ML injection Inject 100 mg into the muscle every 14 (fourteen) days. 0.5 mL     Turmeric 500  MG CAPS Take 500 mg by mouth in the morning.     venlafaxine (EFFEXOR-XR) 150 MG 24 hr capsule Take 150 mg by mouth daily with breakfast.     Vitamin D3 (VITAMIN D) 25 MCG tablet Take 1,000 Units by mouth in the morning.     vitamin E 400 UNIT capsule Take 400 Units by mouth in the morning.     No current facility-administered medications for this visit.    Physical Exam: BP 124/85 (BP Location: Right Arm, Patient Position: Sitting)   Pulse 69   Resp 20   Ht  (1.727 m)   Wt 223 lb (101.2 kg)   SpO2 100% Comment: ra  BMI 33.91 kg/m  He looks well. Cardiac exam shows a regular rate and rhythm with normal heart sounds.  There is no murmur. Lungs are clear. The chest incision is healing well and the sternum is stable. His left leg incision is healing well and there is no lower extremity edema.  Diagnostic Tests:  Narrative & Impression  CLINICAL DATA:  Status post CABG 3 weeks ago. Patient is Victor [name] lightheaded.   EXAM: CHEST - 2 VIEW   COMPARISON:  05/24/2022   FINDINGS: Prior median sternotomy and CABG. Heart size is normal. There are no focal consolidations or pleural effusions. There is minimal atelectasis at the LEFT lung base. No pneumothorax. Remote bilateral shoulder arthroplasty and spinal fusion.   IMPRESSION: Minimal atelectasis at the LEFT lung base.     Electronically Signed   By: Norva Pavlov M.D.   On: 06/14/2022 10:22      Impression:  He is doing well 1 month following his surgery.  I told him that he could return to driving a car at this time but should refrain from lifting anything heavier than 10 pounds for 3 months postoperatively.  He is thinking about cardiac rehab.  He appears motivated to maintain physical activity.  I told him to decrease the amiodarone to 200 mg daily until his prescription is completed and then he can stop it.  Plan:  He has a follow-up appointment next week with Dr. Elease Hashimoto and will return to see me if he has  any problems with his incisions.   Alleen Borne, MD [facility] [phone]

## 2022-06-15 DIAGNOSIS — Z48812 Encounter for surgical aftercare following surgery on the circulatory system: Secondary | ICD-10-CM | POA: Diagnosis not present

## 2022-06-15 DIAGNOSIS — E785 Hyperlipidemia, unspecified: Secondary | ICD-10-CM | POA: Diagnosis not present

## 2022-06-15 DIAGNOSIS — G4733 Obstructive sleep apnea (adult) (pediatric): Secondary | ICD-10-CM | POA: Diagnosis not present

## 2022-06-15 DIAGNOSIS — I739 Peripheral vascular disease, unspecified: Secondary | ICD-10-CM | POA: Diagnosis not present

## 2022-06-15 DIAGNOSIS — I25118 Atherosclerotic heart disease of native coronary artery with other forms of angina pectoris: Secondary | ICD-10-CM | POA: Diagnosis not present

## 2022-06-15 DIAGNOSIS — I129 Hypertensive chronic kidney disease with stage 1 through stage 4 chronic kidney disease, or unspecified chronic kidney disease: Secondary | ICD-10-CM | POA: Diagnosis not present

## 2022-06-15 DIAGNOSIS — Z951 Presence of aortocoronary bypass graft: Secondary | ICD-10-CM | POA: Diagnosis not present

## 2022-06-15 DIAGNOSIS — N1831 Chronic kidney disease, stage 3a: Secondary | ICD-10-CM | POA: Diagnosis not present

## 2022-06-20 ENCOUNTER — Encounter: Payer: Self-pay | Admitting: Cardiovascular Disease

## 2022-06-20 ENCOUNTER — Encounter: Payer: Self-pay | Admitting: Pharmacist

## 2022-06-20 NOTE — Progress Notes (Signed)
[facility])     [facility] Quality Pharmacy Team Statin Quality Measure Assessment  06/20/2022  Victor [name] Bucklew 10-10-1955 [id number]  Per review of chart and payor information, patient has a diagnosis of cardiovascular disease but is not currently filling a statin prescription. This places patient into the [facility] (Statin Use In Patients with Cardiovascular Disease) measure for CMS.    Patient has documented trials of simvastatin and rosuvastatin with reported diffuse muscle and joint pain, but no corresponding CPT codes that would exclude patient from [facility] measure.     Component Value Date/Time   CHOL 153 05/15/2022 1159   TRIG 130 05/15/2022 1159   HDL 52 05/15/2022 1159   CHOLHDL 2.9 05/15/2022 1159   LDLCALC 78 05/15/2022 1159    Please consider ONE of the following recommendations:  Initiate high intensity statin Atorvastatin  once daily, #90, 3 refills   Rosuvastatin  once daily, #90, 3 refills    Initiate moderate intensity  statin with reduced frequency if prior  statin intolerance 1x weekly, #13, 3 refills   2x weekly, #26, 3 refills   3x weekly, #39, 3 refills    Code for past statin intolerance  (required annually)  Provider Requirements: Must associate code during an office visit or telehealth encounter   Drug Induced Myopathy G72.0   Myalgia (SPC ONLY) M79.1   Myositis, unspecified M60.9   Myopathy, unspecified G72.9   Rhabdomyolysis M62.82   Thank you for allowing [facility] to be a part of this patient's care.

## 2022-06-20 NOTE — Progress Notes (Unsigned)
Cardiology Office Note:    Date:  06/22/2022   ID:  Victor [name], DOB 03-Oct-1955, MRN [id number]  PCP:  Tally Joe, MD   [facility] Providers Cardiologist:  Jerzi Tigert    Referring MD: Tally Joe, MD   Chief Complaint  Patient presents with   Coronary Artery Disease         History of Present Illness:    Victor [name] is a 67 y.o. male with a hx of hyperlipidemia and calcifications of the aorta.  We are asked to see him today for further evaluation.  Seen with wife, Victor [name] ( patient of mine)  He thinks I saw him for a pre op clearance in 1990s.  He was found to have some incidental calcium on his aorta.  He had a coronary calcium test which revealed a coronary calcium score of 2535 which places him in the 98th percentile for age/sex matched controls.  No chest pain  Has a funny feeling in his chest ,  central feeling .   Not necessarily with activity Has lots of feet , leg pain ,  Does not walk much ,  can do a stationary bike   Has occasional "sinking " spells - may be hypoglycemia  Feels better after drinking a coke .     His recent hyperlipidemia.  Recent lipids reveal Total cholesterol of 216 HDL is 42 Triglyceride level is 237 LDL is 132.  Was on Simvastatin but is no longer   Will starteon rosuvastatin check labs in 3 months.  Dec. 18, 2023 Victor [name] is seen after his abnormal PET scan and subsequent cath .  He had a coronary calcium test which revealed a coronary calcium score of 2535 which places him in the 98th percentile for age/sex matched controls.  Cath - RCA : occluded prox LAD - 70% prox, D1 90% LCx - 80 % mid stenosis,  OM1 has a 80% stenosis   EF is 52% by PET scan (mildly reduced )   refer to [facility] for consideration for CABG   Has been generally intolerant to rosuvastatin  Is not tolerating the rosuvastatin    June 20, 2021 Victor [name] is seen for follow up of his CAD, CABG CABG March 22 Is out walking  some , Has orthostasis  Is eating and drinking normally now          Past Medical History:  Diagnosis Date   Allergic rhinitis    Anxiety    Arteriosclerosis of thoracic aorta    Arthritis    "all over" (04/15/2013)   Back pain, chronic    BMI 32.0-32.9,adult    Cervicalgia    Chronic neck pain    Coronary artery disease 2023   COVID-19    DDD (degenerative disc disease), cervical    DDD (degenerative disc disease), lumbar    Diverticulosis    Elevated hemoglobin    Fibromyalgia    GERD (gastroesophageal reflux disease)    Gout    Headache    High cholesterol    History of bronchitis    Hyperkalemia    Hypertension    Insomnia    Kidney disease with benign hypertension    Leg cramps    Right greater than left   Low back pain    Low testosterone    Melanoma    Nose   MRSA infection    Neuropathy of both feet    Peripheral vascular disease    Personal history of colonic  polyps    Polycythemia    Pre-diabetes    Radiculopathy of lumbar region    Raynaud phenomenon    Recurrent sinus infections    Right sided sciatica    Sleep apnea    "don't use CPAP" (04/15/2013)   Tobacco dependence     Past Surgical History:  Procedure Laterality Date   ANTERIOR CERVICAL DECOMP/DISCECTOMY FUSION  2000's X3   "had 3 surgeries on my front neck" (04/15/2013)   ANTERIOR LAT LUMBAR FUSION Left 08/06/2017   Procedure: LEFT LUMBAR TWO-THREE ANTERIOR LATERAL LUMBAR FUSION WITH PERCUTANEOUS SCREWS;  Surgeon: Julio Sicks, MD;  Location: [facility] OR;  Service: Neurosurgery;  Laterality: Left;   APPENDECTOMY  ~ 2010   BACK SURGERY  05/31/2021   Lumbar fusion L4-L5 L5-S1 with Dr. Laurell Roof TUNNEL RELEASE Left 1990's   CATARACT EXTRACTION, BILATERAL     COLONOSCOPY     CORONARY ARTERY BYPASS GRAFT N/A 05/19/2022   Procedure: CORONARY ARTERY BYPASS GRAFTING (CABG) X FIVE BYPASSES USING OPEN LEFT INTERNAL MAMMARY ARTERY AND ENDOSCOPIC LEFT GREATER SAPHENOUS VEIN HARVEST;  Surgeon:  Alleen Borne, MD;  Location: [facility] OR;  Service: Open Heart Surgery;  Laterality: N/A;   CORONARY PRESSURE/FFR STUDY N/A 02/03/2022   Procedure: INTRAVASCULAR PRESSURE WIRE/FFR STUDY;  Surgeon: Kathleene Hazel, MD;  Location: [facility];  Service: Cardiovascular;  Laterality: N/A;   ear drum surgery  1974   EYE SURGERY Right    FLEXIBLE SIGMOIDOSCOPY  12/21/2011   Procedure: FLEXIBLE SIGMOIDOSCOPY;  Surgeon: Shirley Friar, MD;  Location: [facility];  Service: Endoscopy;  Laterality: N/A;   HAND TENDON SURGERY Left ~ 1978 X2-1980's X3   "had 5 ORs after laceration"   HOT HEMOSTASIS  12/21/2011   Procedure: HOT HEMOSTASIS (ARGON PLASMA COAGULATION/BICAP);  Surgeon: Shirley Friar, MD;  Location: [facility];  Service: Endoscopy;  Laterality: N/A;   INGUINAL HERNIA REPAIR Left 04/15/2013   Procedure: LAPAROSCOPIC REPAIR OF INCARCERATED LEFT INGUINAL HERNIA;  Surgeon: Atilano Ina, MD;  Location[facility] OR;  Service: General;  Laterality: Left;   INGUINAL HERNIA REPAIR Bilateral 1970's - 1984   INGUINAL HERNIA REPAIR Left 04/15/2013   INSERTION OF MESH Left 04/15/2013   Procedure: INSERTION OF MESH;  Surgeon: Atilano Ina, MD;  Location: [facility] OR;  Service: General;  Laterality: Left;   JOINT REPLACEMENT     right knee   KNEE ARTHROSCOPY Right 1990's   LAPAROSCOPY N/A 04/15/2013   Procedure: LAPAROSCOPY DIAGNOSTIC;  Surgeon: Atilano Ina, MD;  Location: [facility] OR;  Service: General;  Laterality: N/A;   LEFT HEART CATH AND CORONARY ANGIOGRAPHY N/A 02/03/2022   Procedure: LEFT HEART CATH AND CORONARY ANGIOGRAPHY;  Surgeon: Kathleene Hazel, MD;  Location: [facility];  Service: Cardiovascular;  Laterality: N/A;   LUMBAR LAMINECTOMY/DECOMPRESSION MICRODISCECTOMY Right 07/22/2015   Procedure: Right Lumbar Two-Three Microdiscectomy;  Surgeon: Julio Sicks, MD;  Location: [facility]URO [facility];  Service: Neurosurgery;  Laterality: Right;   LUMBAR LAMINECTOMY/DECOMPRESSION  MICRODISCECTOMY Right 09/05/2016   Procedure: Microdiscectomy - right - T12-L1;  Surgeon: Julio Sicks, MD;  Location: [facility] OR;  Service: Neurosurgery;  Laterality: Right;   NASAL SEPTUM SURGERY Bilateral 2014   "removed polyps & infection"   POLYPECTOMY  01/10/2011   Procedure: POLYPECTOMY;  Surgeon: Shirley Friar, MD;  Location: [facility];  Service: Endoscopy;  Laterality: N/A;   POSTERIOR CERVICAL LAMINECTOMY Left 05/24/2020   Procedure: Laminectomy and Foraminotomy - left - Cervical two-Cervical three;  Surgeon: Julio Sicks, MD;  Location: [facility] OR;  Service: Neurosurgery;  Laterality: Left;   POSTERIOR LUMBAR FUSION  11/1995   "ray cages" (04/15/2013)   SHOULDER ARTHROSCOPY Bilateral    "twice each"   TEE WITHOUT CARDIOVERSION N/A 05/19/2022   Procedure: TRANSESOPHAGEAL ECHOCARDIOGRAM;  Surgeon: Alleen Borne, MD;  Location: [facility] OR;  Service: Open Heart Surgery;  Laterality: N/A;   TONSILLECTOMY  1960's   TOTAL KNEE ARTHROPLASTY Right 02/24/2014   Procedure: TOTAL RIGHT KNEE ARTHROPLASTY;  Surgeon: Shelda Pal, MD;  Location: [facility] ORS;  Service: Orthopedics;  Laterality: Right;   TOTAL SHOULDER ARTHROPLASTY Left 09/18/2019   Procedure: TOTAL SHOULDER ARTHROPLASTY;  Surgeon: Francena Hanly, MD;  Location: [facility] ORS;  Service: Orthopedics;  Laterality: Left;    TOTAL SHOULDER ARTHROPLASTY Right 11/04/2020   Procedure: TOTAL SHOULDER ARTHROPLASTY;  Surgeon: Francena Hanly, MD;  Location: [facility] ORS;  Service: Orthopedics;  Laterality: Right;    UPPER GI ENDOSCOPY     VASECTOMY  1988   WISDOM TOOTH EXTRACTION  2000's    Current Medications: Current Meds  Medication Sig   acetaminophen (TYLENOL) 500 MG tablet Take 500-1,000 mg by mouth every 8 (eight) hours as needed for moderate pain.   allopurinol (ZYLOPRIM) 300 MG tablet TAKE 1 AND 1/2 TABLETS BY MOUTH DAILY   ALPRAZolam (XANAX) 1 MG tablet Take 1 mg by mouth 2 (two) times daily as needed for anxiety.   aspirin EC 325 MG  tablet Take 1 tablet (325 mg total) by mouth daily.   b complex vitamins tablet Take 1 tablet by mouth in the morning.   Biotin 5000 MCG CAPS Take 5,000 mcg by mouth in the morning.   Calcium Carbonate (CALCIUM 600 PO) Take 600 mg by mouth in the morning.   carboxymethylcellulose (REFRESH PLUS) 0.5 % SOLN Place 1 drop into both eyes 2 (two) times daily as needed (Dry eyes).   colchicine 0.6 MG tablet TAKE 1 TABLET (0.6 MG TOTAL) BY MOUTH 2 (TWO) TIMES DAILY AS NEEDED.   cyclobenzaprine (FLEXERIL) 10 MG tablet Take 10 mg by mouth 2 (two) times daily as needed for muscle spasms.   Evolocumab (REPATHA SURECLICK) 140 MG/ML SOAJ Inject 140 mg into the skin every 14 (fourteen) days.   fish oil-omega-3 fatty acids 1000 MG capsule Take 1 g by mouth in the morning and at bedtime.   fluticasone (FLONASE) 50 MCG/ACT nasal spray Place 1 spray into both nostrils 2 (two) times daily. For congestion   GINSENG PO Take 400 mg by mouth in the morning.   guaiFENesin (MUCINEX) 600 MG 12 hr tablet Take 600 mg by mouth at bedtime as needed (Congestion).   hydrocortisone cream 1 % Apply 1 application topically daily as needed for itching.   loratadine (CLARITIN) 10 MG tablet Take 10 mg by mouth daily.   magnesium gluconate (MAGONATE) 500 MG tablet Take 500 mg by mouth in the morning.   Melatonin 10 MG TABS Take 10 mg by mouth at bedtime as needed (sleep).   Methylsulfonylmethane (MSM) 1000 MG CAPS Take 1,000 mg by mouth in the morning.   metoprolol tartrate (LOPRESSOR) 25 MG tablet Take 1 tablet (25 mg total) by mouth 2 (two) times daily.   Multiple Vitamins-Minerals (MULTIVITAMIN WITH MINERALS) tablet Take 1 tablet by mouth daily.   nitroGLYCERIN (NITROSTAT) 0.4 MG SL tablet Dissolve 1 tablet under the tongue every 5 minutes as needed for chest pain. Max of 3 doses, then 911.   NONFORMULARY OR COMPOUNDED  ITEM Apply 1 g topically daily as needed (foot discomfort/pain.). Doxepin 5%, Amantadine 3%, Pentoxifylline 3%,  Lamotrigine 2%, Sertraline 3%, Gabapentin 6% PCCA Lipoderm Base Cream   omeprazole (PRILOSEC) 40 MG capsule Take 40 mg by mouth in the morning.   Oxycodone HCl 10 MG TABS Take by mouth.   Psyllium (METAMUCIL FIBER PO) Take 1 Scoop by mouth daily in the afternoon.   selenium 200 MCG TABS tablet Take 200 mcg by mouth in the morning.   sildenafil (VIAGRA) 100 MG tablet Take 100 mg by mouth daily as needed for erectile dysfunction. Per patient taking 1/2 tablet (50 mg) as needed   silodosin (RAPAFLO) 8 MG CAPS capsule Take 8 mg by mouth at bedtime.   sodium chloride (OCEAN) 0.65 % SOLN nasal spray Place 1 spray into both nostrils as needed for congestion.   testosterone cypionate (DEPOTESTOTERONE CYPIONATE) 200 MG/ML injection Inject 100 mg into the muscle every 14 (fourteen) days. 0.5 mL   Turmeric 500 MG CAPS Take 500 mg by mouth in the morning.   venlafaxine (EFFEXOR-XR) 150 MG 24 hr capsule Take 150 mg by mouth daily with breakfast.   Vitamin D3 (VITAMIN D) 25 MCG tablet Take 1,000 Units by mouth in the morning.   vitamin E 400 UNIT capsule Take 400 Units by mouth in the morning.   [DISCONTINUED] amiodarone (PACERONE) 200 MG tablet Take 2 tablets (400 mg total) by mouth 2 (two) times daily foor 7 days, then decrease the dose to 1 tablet (200mg ) by mouth twice daily. (Patient taking differently: Take 200 mg by mouth daily.)     Allergies:   Tetracyclines & related, Amoxicillin, Crestor [rosuvastatin], Doxycycline, and Niacin and related   Social History   Socioeconomic History   Marital status: Married    Spouse name: Debbie   Number of children: 2   Years of education: Not on file   Highest education level: Not on file  Occupational History   Not on file  Tobacco Use   Smoking status: Former    Packs/day: 0.10    Years: 15.00    Additional pack years: 0.00    Total pack years: 1.50    Types: Cigarettes, Cigars    Quit date: 01/08/2010    Years since quitting: 12.4    Passive  exposure: Never   Smokeless tobacco: Former    Types: Chew   Tobacco comments:    04/15/2013 "smoked ~ 1 pack/month"  Vaping Use   Vaping Use: Never used  Substance and Sexual Activity   Alcohol use: Yes    Alcohol/week: 5.0 standard drinks of alcohol    Types: 1 Glasses of wine, 4 Cans of beer per week    Comment: occasional   Drug use: No   Sexual activity: Yes  Other Topics Concern   Not on file  Social History Narrative   Not on file   Social Determinants of Health   Financial Resource Strain: Not on file  Food Insecurity: No Food Insecurity (05/21/2022)   Hunger Vital Sign    Worried About Running Out of Food in the Last Year: Never true    Ran Out of Food in the Last Year: Never true  Transportation Needs: No Transportation Needs (05/21/2022)   PRAPARE - Administrator, Civil Service (Medical): No    Lack of Transportation (Non-Medical): No  Physical Activity: Not on file  Stress: Not on file  Social Connections: Not on file     Family History: The  patient's family history includes Allergies in his daughter; Alzheimer's disease in his mother; Cancer in his mother; Heart Problems in his mother; Hypertension in his father; Interstitial cystitis in his daughter.  ROS:   Please see the history of present illness.     All other systems reviewed and are negative.  EKGs/Labs/Other Studies Reviewed:    The following studies were reviewed today:   EKG:   Recent Labs: 05/17/2022: ALT 26 05/20/2022: Magnesium 2.2 06/21/2022: BUN 17; Creatinine, Ser 1.36; Hemoglobin 13.4; Platelets 274; Potassium 5.2; Sodium 137  Recent Lipid Panel    Component Value Date/Time   CHOL 153 05/15/2022 1159   TRIG 130 05/15/2022 1159   HDL 52 05/15/2022 1159   CHOLHDL 2.9 05/15/2022 1159   LDLCALC 78 05/15/2022 1159     Risk Assessment/Calculations:        Physical Exam:    Physical Exam: Blood pressure 124/82, pulse 70, height 5\' 8"  (1.727 m), weight 221 lb 9.6 oz  (100.5 kg), SpO2 98 %.       GEN:  Well nourished, well developed in no acute distress HEENT: Normal NECK: No JVD; No carotid bruits LYMPHATICS: No lymphadenopathy CARDIAC: RRR , no murmurs, rubs, gallops RESPIRATORY:  Clear to auscultation without rales, wheezing or rhonchi  ABDOMEN: Soft, non-tender, non-distended MUSCULOSKELETAL:  No edema; No deformity  SKIN: Warm and dry NEUROLOGIC:  Alert and oriented x 3   ASSESSMENT:    1. Essential hypertension   2. Coronary artery disease involving native coronary artery of native heart with angina pectoris   3. Mixed hyperlipidemia   4. Mild anemia      PLAN:     Coronary artery disease: He is 1 month out from coronary artery bypass grafting.  He is still feeling a Doby.  Still has some orthostasis.  He looks a [place] bit pale today.  He was mildly anemic when he left the hospital.  Will check a CBC and basic metabolic profile today.  I will have him see Eligha Bridegroom, NP in 1 month for follow-up visit   2.  Hyperlipidemia:   continue Repatha.  Tolerating well     3.  Postoperative atrial fibrillation:  he only had PAF at the time of CABG .   Will DC amiodarone.  He and Eunice Blase will watch for any signs of atrial fib    2.  Hyperlipidemia:  stable             Medication Adjustments/Labs and Tests Ordered: Current medicines are reviewed at length with the patient today.  Concerns regarding medicines are outlined above.  Orders Placed This Encounter  Procedures   CBC   Basic metabolic panel   CBC   No orders of the defined types were placed in this encounter.   Patient Instructions  Medication Instructions:  STOP Amiodarone *If you need a refill on your cardiac medications before your next appointment, please call your pharmacy*   Lab Work: BMET, CBC today CBC in 6 weeks If you have labs (blood work) drawn today and your tests are completely normal, you will receive your results only by: MyChart  Message (if you have MyChart) OR A paper copy in the mail If you have any lab test that is abnormal or we need to change your treatment, we will call you to review the results.  Testing/Procedures: NONE  Follow-Up: At [facility], you and your health needs are our priority.  As part of our continuing mission to provide you  with exceptional heart care, we have created designated Provider Care Teams.  These Care Teams include your primary Cardiologist (physician) and Advanced Practice Providers (APPs -  Physician Assistants and Nurse Practitioners) who all work together to provide you with the care you need, when you need it.  Your next appointment:   6 week(s)  Provider:   Lissa Hoard, NP      Signed, Kristeen Miss, MD  06/22/2022 7:09 AM    [facility]

## 2022-06-21 ENCOUNTER — Encounter: Payer: Self-pay | Admitting: Cardiovascular Disease

## 2022-06-21 ENCOUNTER — Ambulatory Visit: Payer: Medicare Other | Attending: Cardiovascular Disease | Admitting: Cardiovascular Disease

## 2022-06-21 VITALS — BP 124/82 | HR 70 | Ht 68.0 in | Wt 221.6 lb

## 2022-06-21 DIAGNOSIS — D649 Anemia, unspecified: Secondary | ICD-10-CM | POA: Diagnosis not present

## 2022-06-21 DIAGNOSIS — I25119 Atherosclerotic heart disease of native coronary artery with unspecified angina pectoris: Secondary | ICD-10-CM

## 2022-06-21 DIAGNOSIS — I10 Essential (primary) hypertension: Secondary | ICD-10-CM

## 2022-06-21 DIAGNOSIS — E782 Mixed hyperlipidemia: Secondary | ICD-10-CM | POA: Diagnosis not present

## 2022-06-21 NOTE — Patient Instructions (Addendum)
Medication Instructions:  STOP Amiodarone *If you need a refill on your cardiac medications before your next appointment, please call your pharmacy*   Lab Work: BMET, CBC today CBC in 6 weeks If you have labs (blood work) drawn today and your tests are completely normal, you will receive your results only by: MyChart Message (if you have MyChart) OR A paper copy in the mail If you have any lab test that is abnormal or we need to change your treatment, we will call you to review the results.  Testing/Procedures: NONE  Follow-Up: At [facility], you and your health needs are our priority.  As part of our continuing mission to provide you with exceptional heart care, we have created designated Provider Care Teams.  These Care Teams include your primary Cardiologist (physician) and Advanced Practice Providers (APPs -  Physician Assistants and Nurse Practitioners) who all work together to provide you with the care you need, when you need it.  Your next appointment:   6 week(s)  Provider:   Lissa Hoard, NP

## 2022-06-22 DIAGNOSIS — Z9889 Other specified postprocedural states: Secondary | ICD-10-CM | POA: Diagnosis not present

## 2022-06-22 DIAGNOSIS — G894 Chronic pain syndrome: Secondary | ICD-10-CM | POA: Diagnosis not present

## 2022-06-22 LAB — BASIC METABOLIC PANEL
BUN/Creatinine Ratio: 13 (ref 10–24)
BUN: 17 mg/dL (ref 8–27)
CO2: 21 mmol/L (ref 20–29)
Calcium: 9.5 mg/dL (ref 8.6–10.2)
Chloride: 100 mmol/L (ref 96–106)
Creatinine, Ser: 1.36 mg/dL — ABNORMAL HIGH (ref 0.76–1.27)
Glucose: 110 mg/dL — ABNORMAL HIGH (ref 70–99)
Potassium: 5.2 mmol/L (ref 3.5–5.2)
Sodium: 137 mmol/L (ref 134–144)
eGFR: 57 mL/min/{1.73_m2} — ABNORMAL LOW (ref >59)

## 2022-06-22 LAB — CBC
Hematocrit: 42.4 % (ref 37.5–51.0)
Hemoglobin: 13.4 g/dL (ref 13.0–17.7)
MCH: 28.3 pg (ref 26.6–33.0)
MCHC: 31.6 g/dL (ref 31.5–35.7)
MCV: 90 fL (ref 79–97)
Platelets: 274 10*3/uL (ref 150–450)
RBC: 4.73 x10E6/uL (ref 4.14–5.80)
RDW: 13.8 % (ref 11.6–15.4)
WBC: 8.3 10*3/uL (ref 3.4–10.8)

## 2022-06-28 ENCOUNTER — Telehealth (HOSPITAL_COMMUNITY): Payer: Self-pay

## 2022-06-28 NOTE — Telephone Encounter (Signed)
Called and spoke with pt wife Eunice Blase, she stated pt is not interested in CR at this time.   Closed referral

## 2022-07-03 NOTE — Progress Notes (Unsigned)
Cardiology Office Note:    Date:  07/04/2022   ID:  Victor [name], DOB 26-Apr-1955, MRN [id number]  PCP:  Tally Joe, MD   [facility] Providers Cardiologist:  Kristeen Miss, MD     Referring MD: Tally Joe, MD   Chief Complaint: shortness of breath  History of Present Illness:    Victor [name] is a pleasant 67 y.o. 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  Referred to cardiology and seen by Dr. Elease Hashimoto 12/27/2021.  His wife Victor [name] is a patient also of Dr. Harvie Bridge.  He was found to have incidental calcium on his aorta.  Had coronary calcium score 11/28/21 of 2535 (98th percentile age/sex/race).  At the office visit, he reported no chest pain.  He reported a funny feeling in his chest not necessarily with activity.  Has occasional "sinking" spells and feels better after drinking a Coke.  Wonders if it is hypoglycemia.  Does not walk much, can do stationary bike. Symptoms are concerning for angina and cardiac PET was ordered.  He was advised to start rosuvastatin but he later reported muscle pain. He resumed simvastatin. LDL was 166 on 03/17/2022 and he was referred to lipid clinic  PET CT 01/25/22 revealed evidence of ischemia in the inferior wall and he was advised to undergo cardiac catheterization. LHC 02/03/22 revealed severe triple-vessel CAD with LAD hazy, 70% proximal stenosis with RFR of 0.90 suggesting borderline flow limitation, the large diagonal branch has severe ostial stenosis, circumflex is large caliber vessel with moderate proximal stenosis, mid vessel has severe stenosis involving 2 obtuse marginal branches, large dominant RCA has 100% proximal chronic total occlusion with distal branches feeling left to right collaterals, preserved LV function. He was referred for  outpatient CABG.  He underwent CABG x 5 on 05/19/22 with Dr. Laneta Simmers (LIMA to LAD, SVG to first diagonal, sequential SVG to first and second obtuse marginals and a separate SVG to PDA).  He developed atrial fibrillation while still in the ICU and was loaded with IV amiodarone resulting in conversion back to sinus rhythm.  Amiodarone was transitioned to oral form.  He had no further significant postop complications.  Seen in clinic for follow-up by Dr. Elease Hashimoto on 06/21/22 at which time he reported some orthostasis and fatigue felt to be consistent with recent surgery. CBC and BMP collected at that visit were stable. Amiodarone was discontinued. He was advised to return in 6 weeks.  Today, he is here with his wife for follow-up. He reports he has been progressively trying to increase activity.  His wife has noted episodes of breathlessness after walking and decreased O2 sat on pulse oximeter. Pulse generally in the low 100s with activity. Also gets lightheaded at time with activity. Has had many orthopedic surgeries and has chronic pain in his back, shoulders, and other areas. Needs redo shoulder replacement. Admits he was somewhat deconditioned prior to surgery, "I was as active as I could be." Has occasional feeling of muscle tightness in upper chest. No chest pain, lower extremity edema, palpitations, melena, hematuria, hemoptysis, diaphoresis, weakness, presyncope, syncope, orthopnea, and PND. No feelings of a fib like he had after CABG.   Past Medical History:  Diagnosis Date   Allergic rhinitis    Anxiety    Arteriosclerosis of thoracic aorta (HCC)    Arthritis    "  all over" (04/15/2013)   Back pain, chronic    BMI 32.0-32.9,adult    Cervicalgia    Chronic neck pain    Coronary artery disease 2023   COVID-19    DDD (degenerative disc disease), cervical    DDD (degenerative disc disease), lumbar    Diverticulosis    Elevated hemoglobin (HCC)    Fibromyalgia    GERD (gastroesophageal reflux  disease)    Gout    Headache    High cholesterol    History of bronchitis    Hyperkalemia    Hypertension    Insomnia    Kidney disease with benign hypertension    Leg cramps    Right greater than left   Low back pain    Low testosterone    Melanoma (HCC)    Nose   MRSA infection    Neuropathy of both feet    Peripheral vascular disease (HCC)    Personal history of colonic polyps    Polycythemia    Pre-diabetes    Radiculopathy of lumbar region    Raynaud phenomenon    Recurrent sinus infections    Right sided sciatica    Sleep apnea    "don't use CPAP" (04/15/2013)   Tobacco dependence     Past Surgical History:  Procedure Laterality Date   ANTERIOR CERVICAL DECOMP/DISCECTOMY FUSION  2000's X3   "had 3 surgeries on my front neck" (04/15/2013)   ANTERIOR LAT LUMBAR FUSION Left 08/06/2017   Procedure: LEFT LUMBAR TWO-THREE ANTERIOR LATERAL LUMBAR FUSION WITH PERCUTANEOUS SCREWS;  Surgeon: Julio Sicks, MD;  Location: [facility] OR;  Service: Neurosurgery;  Laterality: Left;   APPENDECTOMY  ~ 2010   BACK SURGERY  05/31/2021   Lumbar fusion L4-L5 L5-S1 with Dr. Laurell Roof TUNNEL RELEASE Left 1990's   CATARACT EXTRACTION, BILATERAL     COLONOSCOPY     CORONARY ARTERY BYPASS GRAFT N/A 05/19/2022   Procedure: CORONARY ARTERY BYPASS GRAFTING (CABG) X FIVE BYPASSES USING OPEN LEFT INTERNAL MAMMARY ARTERY AND ENDOSCOPIC LEFT GREATER SAPHENOUS VEIN HARVEST;  Surgeon: Alleen Borne, MD;  Location: [facility] OR;  Service: Open Heart Surgery;  Laterality: N/A;   CORONARY PRESSURE/FFR STUDY N/A 02/03/2022   Procedure: INTRAVASCULAR PRESSURE WIRE/FFR STUDY;  Surgeon: Kathleene Hazel, MD;  Location: [facility];  Service: Cardiovascular;  Laterality: N/A;   ear drum surgery  1974   EYE SURGERY Right    FLEXIBLE SIGMOIDOSCOPY  12/21/2011   Procedure: FLEXIBLE SIGMOIDOSCOPY;  Surgeon: Shirley Friar, MD;  Location: [facility];  Service: Endoscopy;  Laterality: N/A;   HAND  TENDON SURGERY Left ~ 1978 X2-1980's X3   "had 5 ORs after laceration"   HOT HEMOSTASIS  12/21/2011   Procedure: HOT HEMOSTASIS (ARGON PLASMA COAGULATION/BICAP);  Surgeon: Shirley Friar, MD;  Location: [facility];  Service: Endoscopy;  Laterality: N/A;   INGUINAL HERNIA REPAIR Left 04/15/2013   Procedure: LAPAROSCOPIC REPAIR OF INCARCERATED LEFT INGUINAL HERNIA;  Surgeon: Atilano Ina, MD;  Location: [facility] OR;  Service: General;  Laterality: Left;   INGUINAL HERNIA REPAIR Bilateral 1970's - 1984   INGUINAL HERNIA REPAIR Left 04/15/2013   INSERTION OF MESH Left 04/15/2013   Procedure: INSERTION OF MESH;  Surgeon: Atilano Ina, MD;  Location: [facility] OR;  Service: General;  Laterality: Left;   JOINT REPLACEMENT     right knee   KNEE ARTHROSCOPY Right 1990's   LAPAROSCOPY N/A 04/15/2013   Procedure: LAPAROSCOPY DIAGNOSTIC;  Surgeon: Atilano Ina,  MD;  Location: [facility] OR;  Service: General;  Laterality: N/A;   LEFT HEART CATH AND CORONARY ANGIOGRAPHY N/A 02/03/2022   Procedure: LEFT HEART CATH AND CORONARY ANGIOGRAPHY;  Surgeon: Kathleene Hazel, MD;  Location: [facility];  Service: Cardiovascular;  Laterality: N/A;   LUMBAR LAMINECTOMY/DECOMPRESSION MICRODISCECTOMY Right 07/22/2015   Procedure: Right Lumbar Two-Three Microdiscectomy;  Surgeon: Julio Sicks, MD;  Location: [facility]URO [facility];  Service: Neurosurgery;  Laterality: Right;   LUMBAR LAMINECTOMY/DECOMPRESSION MICRODISCECTOMY Right 09/05/2016   Procedure: Microdiscectomy - right - T12-L1;  Surgeon: Julio Sicks, MD;  Location: [facility] OR;  Service: Neurosurgery;  Laterality: Right;   NASAL SEPTUM SURGERY Bilateral 2014   "removed polyps & infection"   POLYPECTOMY  01/10/2011   Procedure: POLYPECTOMY;  Surgeon: Shirley Friar, MD;  Location: [facility];  Service: Endoscopy;  Laterality: N/A;   POSTERIOR CERVICAL LAMINECTOMY Left 05/24/2020   Procedure: Laminectomy and Foraminotomy - left - Cervical two-Cervical three;  Surgeon:  Julio Sicks, MD;  Location: [facility] OR;  Service: Neurosurgery;  Laterality: Left;   POSTERIOR LUMBAR FUSION  11/1995   "ray cages" (04/15/2013)   SHOULDER ARTHROSCOPY Bilateral    "twice each"   TEE WITHOUT CARDIOVERSION N/A 05/19/2022   Procedure: TRANSESOPHAGEAL ECHOCARDIOGRAM;  Surgeon: Alleen Borne, MD;  Location: [facility] OR;  Service: Open Heart Surgery;  Laterality: N/A;   TONSILLECTOMY  1960's   TOTAL KNEE ARTHROPLASTY Right 02/24/2014   Procedure: TOTAL RIGHT KNEE ARTHROPLASTY;  Surgeon: Shelda Pal, MD;  Location: [facility]S;  Service: Orthopedics;  Laterality: Right;   TOTAL SHOULDER ARTHROPLASTY Left 09/18/2019   Procedure: TOTAL SHOULDER ARTHROPLASTY;  Surgeon: Francena Hanly, MD;  Location: [facility] ORS;  Service: Orthopedics;  Laterality: Left;    TOTAL SHOULDER ARTHROPLASTY Right 11/04/2020   Procedure: TOTAL SHOULDER ARTHROPLASTY;  Surgeon: Francena Hanly, MD;  Location: [facility] ORS;  Service: Orthopedics;  Laterality: Right;    UPPER GI ENDOSCOPY     VASECTOMY  1988   WISDOM TOOTH EXTRACTION  2000's    Current Medications: Current Meds  Medication Sig   acetaminophen (TYLENOL) 500 MG tablet Take 500-1,000 mg by mouth every 8 (eight) hours as needed for moderate pain.   allopurinol (ZYLOPRIM) 300 MG tablet TAKE 1 AND 1/2 TABLETS BY MOUTH DAILY   ALPRAZolam (XANAX) 1 MG tablet Take 1 mg by mouth 2 (two) times daily as needed for anxiety.   amLODipine (NORVASC) 5 MG tablet Take 5 mg by mouth daily.   aspirin EC 325 MG tablet Take 1 tablet (325 mg total) by mouth daily.   b complex vitamins tablet Take 1 tablet by mouth in the morning.   Biotin 5000 MCG CAPS Take 5,000 mcg by mouth in the morning.   Calcium Carbonate (CALCIUM 600 PO) Take 600 mg by mouth in the morning.   carboxymethylcellulose (REFRESH PLUS) 0.5 % SOLN Place 1 drop into both eyes 2 (two) times daily as needed (Dry eyes).   colchicine 0.6 MG tablet TAKE 1 TABLET (0.6 MG TOTAL) BY MOUTH 2 (TWO) TIMES DAILY AS  NEEDED.   cyclobenzaprine (FLEXERIL) 10 MG tablet Take 10 mg by mouth 2 (two) times daily as needed for muscle spasms.   Evolocumab (REPATHA SURECLICK) 140 MG/ML SOAJ Inject 140 mg into the skin every 14 (fourteen) days.   fish oil-omega-3 fatty acids 1000 MG capsule Take 1 g by mouth in the morning and at bedtime.   fluticasone (FLONASE) 50 MCG/ACT nasal spray Place 1 spray into both  nostrils 2 (two) times daily. For congestion   GINSENG PO Take 400 mg by mouth in the morning.   guaiFENesin (MUCINEX) 600 MG 12 hr tablet Take 600 mg by mouth at bedtime as needed (Congestion).   hydrocortisone cream 1 % Apply 1 application topically daily as needed for itching.   loratadine (CLARITIN) 10 MG tablet Take 10 mg by mouth daily.   magnesium gluconate (MAGONATE) 500 MG tablet Take 500 mg by mouth in the morning.   Melatonin 10 MG TABS Take 10 mg by mouth at bedtime as needed (sleep).   Methylsulfonylmethane (MSM) 1000 MG CAPS Take 1,000 mg by mouth in the morning.   metoprolol tartrate (LOPRESSOR) 25 MG tablet Take 1 tablet (25 mg total) by mouth 2 (two) times daily.   Multiple Vitamins-Minerals (MULTIVITAMIN WITH MINERALS) tablet Take 1 tablet by mouth daily.   nitroGLYCERIN (NITROSTAT) 0.4 MG SL tablet Dissolve 1 tablet under the tongue every 5 minutes as needed for chest pain. Max of 3 doses, then 911.   NONFORMULARY OR COMPOUNDED ITEM Apply 1 g topically daily as needed (foot discomfort/pain.). Doxepin 5%, Amantadine 3%, Pentoxifylline 3%, Lamotrigine 2%, Sertraline 3%, Gabapentin 6% PCCA Lipoderm Base Cream   omeprazole (PRILOSEC) 40 MG capsule Take 40 mg by mouth in the morning.   Oxycodone HCl 10 MG TABS Take by mouth.   Psyllium (METAMUCIL FIBER PO) Take 1 Scoop by mouth daily in the afternoon.   selenium 200 MCG TABS tablet Take 200 mcg by mouth in the morning.   sildenafil (VIAGRA) 100 MG tablet Take 100 mg by mouth daily as needed for erectile dysfunction. Per patient taking 1/2 tablet (50  mg) as needed   silodosin (RAPAFLO) 8 MG CAPS capsule Take 8 mg by mouth at bedtime.   sodium chloride (OCEAN) 0.65 % SOLN nasal spray Place 1 spray into both nostrils as needed for congestion.   testosterone cypionate (DEPOTESTOTERONE CYPIONATE) 200 MG/ML injection Inject 100 mg into the muscle every 14 (fourteen) days. 0.5 mL   Turmeric 500 MG CAPS Take 500 mg by mouth in the morning.   venlafaxine (EFFEXOR-XR) 150 MG 24 hr capsule Take 150 mg by mouth daily with breakfast.   Vitamin D3 (VITAMIN D) 25 MCG tablet Take 1,000 Units by mouth in the morning.   vitamin E 400 UNIT capsule Take 400 Units by mouth in the morning.     Allergies:   Tetracyclines & related, Amoxicillin, Crestor [rosuvastatin], Doxycycline, and Niacin and related   Social History   Socioeconomic History   Marital status: Married    Spouse name: Victor [name]   Number of children: 2   Years of education: Not on file   Highest education level: Not on file  Occupational History   Not on file  Tobacco Use   Smoking status: Former    Packs/day: 0.10    Years: 15.00    Additional pack years: 0.00    Total pack years: 1.50    Types: Cigarettes, Cigars    Quit date: 01/08/2010    Years since quitting: 12.4    Passive exposure: Never   Smokeless tobacco: Former    Types: Chew   Tobacco comments:    04/15/2013 "smoked ~ 1 pack/month"  Vaping Use   Vaping Use: Never used  Substance and Sexual Activity   Alcohol use: Yes    Alcohol/week: 5.0 standard drinks of alcohol    Types: 1 Glasses of wine, 4 Cans of beer per week    Comment:  occasional   Drug use: No   Sexual activity: Yes  Other Topics Concern   Not on file  Social History Narrative   Not on file   Social Determinants of Health   Financial Resource Strain: Not on file  Food Insecurity: No Food Insecurity (05/21/2022)   Hunger Vital Sign    Worried About Running Out of Food in the Last Year: Never true    Ran Out of Food in the Last Year: Never true   Transportation Needs: No Transportation Needs (05/21/2022)   PRAPARE - Administrator, Civil Service (Medical): No    Lack of Transportation (Non-Medical): No  Physical Activity: Not on file  Stress: Not on file  Social Connections: Not on file     Family History: The patient's family history includes Allergies in his daughter; Alzheimer's disease in his mother; Cancer in his mother; Heart Problems in his mother; Hypertension in his father; Interstitial cystitis in his daughter.  ROS:   Please see the history of present illness.   + lightheadedness + shortness of breath All other systems reviewed and are negative.  Labs/Other Studies Reviewed:    The following studies were reviewed today:  Cardiac Studies & Procedures   CARDIAC CATHETERIZATION  CARDIAC CATHETERIZATION 02/03/2022  Narrative   Prox RCA to Mid RCA lesion is 100% stenosed.   Prox Cx lesion is 60% stenosed.   Prox Cx to Mid Cx lesion is 80% stenosed.   1st Mrg lesion is 80% stenosed.   1st Diag lesion is 90% stenosed.   Prox LAD to Mid LAD lesion is 70% stenosed.   The left ventricular systolic function is normal.   LV end diastolic pressure is normal.   The left ventricular ejection fraction is 55-65% by visual estimate.  Severe triple vessel CAD The LAD has a hazy, 70% proximal stenosis (RFR of 0.90 suggesting borderline flow limitation). The Large Diagonal branch has severe ostial stenosis The Circumflex is a large caliber vessel with moderate proximal stenosis. The mid vessel has severe stenosis involving two obtuse marginal branches. The Large dominant RCA has 100% proximal chronic total occlusion. The distal branches fill from left to right collaterals. Preserved LV systolic function by LV gram.  Recommendations: He has multi-vessel CAD. He has no angina and what appears to be preserved LV systolic function by LV gram. He would benefit from bypass surgery for preservation of LV function. I  will discharge him today after bedrest. Will review with Dr. Elease Hashimoto and plan echo then office follow up with Dr. Elease Hashimoto. We will most likely place an outpatient referral to CT surgery for CABG during his visit with Dr. Elease Hashimoto. An alternative option would be medical management of his CAD for now given the fact that he is asymptomatic.  Findings Coronary Findings Diagnostic  Dominance: Right  Left Anterior Descending Prox LAD to Mid LAD lesion is 70% stenosed.  First Diagonal Branch 1st Diag lesion is 90% stenosed.  Left Circumflex Vessel is large. Prox Cx lesion is 60% stenosed. Prox Cx to Mid Cx lesion is 80% stenosed.  First Obtuse Marginal Branch 1st Mrg lesion is 80% stenosed.  Right Coronary Artery Vessel is large. Prox RCA to Mid RCA lesion is 100% stenosed. The lesion is chronically occluded.  Third Right Posterolateral Branch Collaterals 3rd RPL filled by collaterals from Dist Cx.  Intervention  No interventions have been documented.   STRESS TESTS  NM PET CT CARDIAC PERFUSION MULTI W/ABSOLUTE BLOODFLOW 01/25/2022  Narrative  Findings are consistent with ischemia. The study is intermediate risk.   LV perfusion is abnormal. There is evidence of ischemia. Defect 1: There is a small defect with mild reduction in uptake present in the mid to basal inferior location(s) that is reversible. There is normal wall motion in the defect area. Consistent with ischemia. The defect is consistent with abnormal perfusion in the RCA territory.   Rest left ventricular function is normal. Rest EF: 52 %. Stress left ventricular function is normal. Stress EF: 62 %. End diastolic cavity size is normal. End systolic cavity size is normal.   Myocardial blood flow was computed to be 0.65ml/g/min at rest and 1.75ml/g/min at stress. Global myocardial blood flow reserve was 2.18 and was normal.   Coronary calcium was present on the attenuation correction CT images. Severe coronary calcifications  were present. Coronary calcifications were present in the left anterior descending artery, left circumflex artery and right coronary artery distribution(s).   Severe multivessel coronary calcifications and presence of TID (ratio 1.25) raises concern for multivessel obstructive CAD.  However, there is only a small area of ischemia on perfusion images (basal to mid inferior wall), flow reserve is normal globally and in each coronary territory, and there is appropriate increase in LVEF with stress, which argues against multivessel disease.  Overall, would classify as intermediate risk.   Electronically signed by Epifanio Lesches, MD  CLINICAL DATA:  This over-read does not include interpretation of cardiac or coronary anatomy or pathology. The Cardiac PET interpretation by the cardiologist is attached.  COMPARISON:  Coronary calcium scoring on November 28, 2021.  FINDINGS: Vascular: Please see dedicated report for cardiovascular details.  Mediastinum/Nodes: No adenopathy or acute process in the mediastinum.  Lungs/Pleura: Basilar atelectasis. Lungs are otherwise clear and airways are patent to the extent evaluated.  Upper Abdomen: Incidental imaging of upper abdominal contents shows no acute process on limited assessment.  Musculoskeletal: Gynecomastia.  Spinal degenerative changes.  IMPRESSION: No acute or significant extracardiac findings.   Electronically Signed By: Donzetta Kohut M.D. On: 01/24/2022 11:07   ECHOCARDIOGRAM  ECHOCARDIOGRAM COMPLETE 03/07/2022  Narrative ECHOCARDIOGRAM REPORT   IMPRESSIONS   1. Left ventricular ejection fraction, by estimation, is 60 to 65%. The left ventricle has normal function. The left ventricle has no regional wall motion abnormalities. There is mild left ventricular hypertrophy. Left ventricular diastolic parameters are consistent with Grade I diastolic dysfunction (impaired relaxation). 2. Right ventricular systolic function is  normal. The right ventricular size is normal. 3. The mitral valve is normal in structure. No evidence of mitral valve regurgitation. No evidence of mitral stenosis. 4. The aortic valve is tricuspid. Aortic valve regurgitation is mild. No aortic stenosis is present. Aortic regurgitation PHT measures 701 msec. 5. Aortic dilatation noted. There is mild dilatation of the aortic root, measuring 42 mm. There is mild dilatation of the ascending aorta, measuring 43 mm. 6. The inferior vena cava is normal in size with greater than 50% respiratory variability, suggesting right atrial pressure of 3 mmHg.  FINDINGS Left Ventricle: Left ventricular ejection fraction, by estimation, is 60 to 65%. The left ventricle has normal function. The left ventricle has no regional wall motion abnormalities. The left ventricular internal cavity size was normal in size. There is mild left ventricular hypertrophy. Left ventricular diastolic parameters are consistent with Grade I diastolic dysfunction (impaired relaxation).  Right Ventricle: The right ventricular size is normal. No increase in right ventricular wall thickness. Right ventricular systolic function is normal.  Left Atrium:  Left atrial size was normal in size.  Right Atrium: Right atrial size was normal in size.  Pericardium: There is no evidence of pericardial effusion.  Mitral Valve: The mitral valve is normal in structure. No evidence of mitral valve regurgitation. No evidence of mitral valve stenosis.  Tricuspid Valve: The tricuspid valve is normal in structure. Tricuspid valve regurgitation is not demonstrated. No evidence of tricuspid stenosis.  Aortic Valve: The aortic valve is tricuspid. Aortic valve regurgitation is mild. Aortic regurgitation PHT measures 701 msec. No aortic stenosis is present.  Pulmonic Valve: The pulmonic valve was normal in structure. Pulmonic valve regurgitation is not visualized. No evidence of pulmonic stenosis.  Aorta:  Aortic dilatation noted. There is mild dilatation of the aortic root, measuring 42 mm. There is mild dilatation of the ascending aorta, measuring 43 mm.  Venous: The inferior vena cava is normal in size with greater than 50% respiratory variability, suggesting right atrial pressure of 3 mmHg.  IAS/Shunts: No atrial level shunt detected by color flow Doppler.  Final   TEE  ECHO INTRAOPERATIVE TEE 05/19/2022  Narrative *INTRAOPERATIVE TRANSESOPHAGEAL REPORT *    Patient Name:   MELVERN FAUBION Wentworth Date of Exam: 05/19/2022 Medical Rec #:  [id number]             Height:       68.0 in Accession #:    [id number]            Weight:       230.0 lb Date of Birth:  [birth year]             BSA:          2.17 m Patient Age:    66 years              BP:           120/81 mmHg Patient Gender: M                     HR:           52 bpm. Exam Location:  Inpatient  Transesophogeal exam was perform intraoperatively during surgical procedure. Patient was closely monitored under general anesthesia during the entirety of examination.  Indications:     I25.10 CAD Performing Phys: [id number] Alleen Borne Diagnosing Phys: Jairo Ben MD  Complications: No known complications during this procedure. POST-OP IMPRESSIONS limited post-CPB exam: The patient separated easily from CPB.  _ Left Ventricle: The left ventricle appears unchanged from pre-bypass images. There are no wall motion abnormalities, and contractility is normal. Overall EF 62%. _ Right Ventricle: The right ventricle appears unchanged from pre-bypass images. _ Aortic Valve: The aortic valve function appears unchanged from pre-bypass images. There is trivial AI. _ Mitral Valve: The mitral valve function appears unchanged from pre-bypass images. There is triivial MR. _ Tricuspid Valve: The tricuspid valve function appears unchanged from pre-bypass images.  PRE-OP FINDINGS Left Ventricle: The left ventricle has normal systolic  function, with an ejection fraction of 60-65%, measured 62%. The cavity size was normal. No evidence of left ventricular regional wall motion abnormalities. There is no left ventricular hypertrophy. Left ventricular diastolic function was not evaluated.  Right Ventricle: The right ventricle has normal systolic function. The cavity was normal. There is no increase in right ventricular wall thickness. Catheter present in the right ventricle.  Left Atrium: Left atrial size was normal in size. No left atrial/left atrial appendage thrombus was detected. Left atrial appendage velocity is normal  at greater than 40 cm/s.  Right Atrium: Right atrial size was normal in size. Prominent Eustachian valve. Catheter present in the right atrium.  Interatrial Septum: No atrial level shunt detected by color flow Doppler.  Pericardium: There is no evidence of pericardial effusion.  Mitral Valve: The mitral valve is normal in structure. Mitral valve regurgitation is trivial by color flow Doppler. There is no evidence of mitral valve vegetation. There is no evidence of mitral stenosis, with peak gradient 4 mmHg, mean gradient 1 mmHg.  Tricuspid Valve: The tricuspid valve was normal in structure. Tricuspid valve regurgitation was not visualized by color flow Doppler. No evidence of tricuspid stenosis is present. There is no evidence of tricuspid valve vegetation.  Aortic Valve: The aortic valve is tricuspid. Aortic valve regurgitation is trivial by color flow Doppler. There is no stenosis of the aortic valve, with peak gradient 5 mmHg, mean gradient 3 mmHg. There is no evidence of aortic valve vegetation.  Pulmonic Valve: The pulmonic valve was normal in structure, with normal leaflet mobility. No evidence of pulmonic stenosis. Pulmonic valve regurgitation is trivial by color flow Doppler, around the PA catheter.   Aorta: The aortic root, ascending aorta and aortic arch are normal in size and structure. There is  evidence of plaque in the descending aorta; Grade II, measuring 2-28mm in size.  Pulmonary Artery: Theone Murdoch catheter present on the left. The pulmonary artery is of normal size.  Venous: The inferior vena cava is normal in size with greater than 50% respiratory variability, suggesting right atrial pressure of 3 mmHg.  Shunts: There is no evidence of an atrial septal defect.      CT SCANS  CT CARDIAC SCORING (SELF PAY ONLY) 11/28/2021  Addendum 11/28/2021  4:03 PM ADDENDUM REPORT: 11/28/2021 16:01  EXAM: OVER-READ INTERPRETATION  CT CHEST  The following report is a limited chest CT over-read performed by radiologist Dr. Jeronimo Greaves of [facility] on 11/28/2021. This over-read does not include interpretation of cardiac or coronary anatomy or pathology. The calcium score interpretation by the cardiologist is attached.  COMPARISON:  None Available.  FINDINGS: Vascular: Aortic atherosclerosis. Upper normal to borderline dilated ascending aorta, 4.0 cm on transverse image 30/4 and 53 coronal.  Mediastinum/Nodes: No imaged thoracic adenopathy.  Lungs/Pleura: No pleural fluid.  Clear imaged lungs.  Upper Abdomen: Normal imaged portions of the liver, spleen, stomach.  Musculoskeletal: Remote posterior left rib fractures. Advanced lower thoracic spondylosis.  IMPRESSION: No acute findings in the imaged extracardiac chest.  Borderline to mildly dilated ascending aorta at 4.0 cm. Recommend annual imaging followup by CTA or MRA. This recommendation follows 2010 ACCF/AHA/AATS/ACR/ASA/SCA/SCAI/SIR/STS/SVM Guidelines for the Diagnosis and Management of Patients with Thoracic Aortic Disease. Circulation. 2010; 121: W098-J191. Aortic aneurysm NOS (ICD10-I71.9)   Electronically Signed By: Jeronimo Greaves M.D. On: 11/28/2021 16:01  Narrative CLINICAL DATA:  Cardiovascular Disease Risk stratification  EXAM: Coronary Calcium Score  TECHNIQUE: A gated, non-contrast  computed tomography scan of the heart was performed using 3mm slice thickness. Axial images were analyzed on a dedicated workstation. Calcium scoring of the coronary arteries was performed using the Agatston method.  FINDINGS: Coronary arteries: Normal origins.  Coronary Calcium Score:  Left main: 0  Left anterior descending artery: 462  Left circumflex artery: 651  Right coronary artery: 1422  Total: 2535  Percentile: 98  Pericardium: Normal.  Ascending Aorta: Mildly dilated ascending aorta - 43 mm  Non-cardiac: See separate report from [facility].  IMPRESSION: 1. Coronary calcium score  of 2535. This was 79 percentile for age-, race-, and sex-matched controls.  2.  Mildly dilated ascending aorta - 43 mm             Recent Labs: 05/17/2022: ALT 26 05/20/2022: Magnesium 2.2 06/21/2022: BUN 17; Creatinine, Ser 1.36; Hemoglobin 13.4; Platelets 274; Potassium 5.2; Sodium 137  Recent Lipid Panel    Component Value Date/Time   CHOL 153 05/15/2022 1159   TRIG 130 05/15/2022 1159   HDL 52 05/15/2022 1159   CHOLHDL 2.9 05/15/2022 1159   LDLCALC 78 05/15/2022 1159     Risk Assessment/Calculations:           Physical Exam:    VS:  BP 138/60   Pulse 75   Ht 5\' 8"  (1.727 m)   Wt 222 lb 6.4 oz (100.9 kg)   SpO2 98%   BMI 33.82 kg/m     Wt Readings from Last 3 Encounters:  07/04/22 222 lb 6.4 oz (100.9 kg)  06/21/22 221 lb 9.6 oz (100.5 kg)  06/14/22 223 lb (101.2 kg)     GEN: Chronically ill appearing in no acute distress HEENT: Normal NECK: No JVD; No carotid bruits CARDIAC: RRR, no murmurs, rubs, gallops RESPIRATORY:  Clear to auscultation without rales, wheezing or rhonchi  ABDOMEN: Soft, non-tender, non-distended MUSCULOSKELETAL:  No edema; No deformity. 2+ pedal pulses, equal bilaterally SKIN: Warm and dry. Sternotomy site is well healed NEUROLOGIC:  Alert and oriented x 3 PSYCHIATRIC:  Normal affect   EKG:  EKG is not ordered today.         Diagnoses:    1. Lightheadedness   2. 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 Mixed hyperlipidemia   5. S/P CABG x 5   6. Shortness of breath    Assessment and Plan:     Lightheadedness and shortness of breath: He is 6 weeks post CABG.  Reports some lightheadedness and shortness of breath with walking.  No chest pain, presyncope, syncope.  BP is well-controlled. I walked him 600 feet around the office with the pulse oximeter on. He tolerated it well and had no dyspnea, chest pain, palpitations, presyncope, syncope. Pulse ox remained in the upper 90s. HR was well-controlled. I suspect he has been deconditioned due to chronic pain and continues to recover from major surgery. Encouraged him to gradually increase activity, even just 1-2 minutes every few days.   CAD without angina: s/p CABG x 5 05/19/22. As noted above, having some SOB and lightheadedness with exertion. He denies chest pain and palpitations. Sternotomy site is well-healed. Encouraged him to gradually increase walking and light activity around home. Continue sternal precautions. No evidence of fluid overload. Hemoglobin was low postoperatively with improvement noted on CBC 06/21/2022. We are rechecking in a few weeks. No bleeding concerns. We will see him back in 2 months for follow-up.  Hyperlipidemia LDL goal < 55: LDL 78 on 05/15/22. We will recheck at lab appointment June 5. Continue Repatha.   Hypertension: BP is well-controlled. He reports no abnormal readings at home.  Continue amlodipine, metoprolol.     Cardiac Rehabilitation Eligibility Assessment  The patient is ready to start cardiac rehabilitation pending clearance from the cardiac surgeon.       Disposition: 2 months with Dr. Elease Hashimoto or me  Medication Adjustments/Labs and Tests Ordered: Current medicines are reviewed at length with the patient today.  Concerns  regarding medicines are outlined  above.  Orders Placed This Encounter  Procedures   Comprehensive metabolic panel   Lipid panel   Comprehensive metabolic panel   Lipid panel   No orders of the defined types were placed in this encounter.   Patient Instructions  Medication Instructions:  Your physician recommends that you continue on your current medications as directed. Please refer to the Current Medication list given to you today.  *If you need a refill on your cardiac medications before your next appointment, please call your pharmacy*   Lab Work: TO BE DONE IN JUNE: CMET, LIPIDS If you have labs (blood work) drawn today and your tests are completely normal, you will receive your results only by: MyChart Message (if you have MyChart) OR A paper copy in the mail If you have any lab test that is abnormal or we need to change your treatment, we will call you to review the results.   Testing/Procedures: NONE   Follow-Up: At [facility], you and your health needs are our priority.  As part of our continuing mission to provide you with exceptional heart care, we have created designated Provider Care Teams.  These Care Teams include your primary Cardiologist (physician) and Advanced Practice Providers (APPs -  Physician Assistants and Nurse Practitioners) who all work together to provide you with the care you need, when you need it.  We recommend signing up for the patient portal called "MyChart".  Sign up information is provided on this After Visit Summary.  MyChart is used to connect with patients for Virtual Visits (Telemedicine).  Patients are able to view lab/test results, encounter notes, upcoming appointments, etc.  Non-urgent messages can be sent to your provider as well.   To learn more about what you can do with MyChart, go to [email or web address].    Your next appointment:   3 month(s)  Provider:   Kristeen Miss, MD  or Elyse Jarvis, NP     Signed, Levi Aland, NP  07/04/2022 5:01 PM    [facility]

## 2022-07-04 ENCOUNTER — Encounter: Payer: Self-pay | Admitting: Nurse Practitioner

## 2022-07-04 ENCOUNTER — Ambulatory Visit: Payer: Medicare Other | Attending: Nurse Practitioner | Admitting: Nurse Practitioner

## 2022-07-04 VITALS — BP 138/60 | HR 75 | Ht 68.0 in | Wt 222.4 lb

## 2022-07-04 DIAGNOSIS — I10 Essential (primary) hypertension: Secondary | ICD-10-CM

## 2022-07-04 DIAGNOSIS — E782 Mixed hyperlipidemia: Secondary | ICD-10-CM

## 2022-07-04 DIAGNOSIS — R0602 Shortness of breath: Secondary | ICD-10-CM

## 2022-07-04 DIAGNOSIS — R42 Dizziness and giddiness: Secondary | ICD-10-CM

## 2022-07-04 DIAGNOSIS — I25119 Atherosclerotic heart disease of native coronary artery with unspecified angina pectoris: Secondary | ICD-10-CM

## 2022-07-04 DIAGNOSIS — Z951 Presence of aortocoronary bypass graft: Secondary | ICD-10-CM | POA: Diagnosis not present

## 2022-07-04 NOTE — Patient Instructions (Addendum)
Medication Instructions:  Your physician recommends that you continue on your current medications as directed. Please refer to the Current Medication list given to you today.  *If you need a refill on your cardiac medications before your next appointment, please call your pharmacy*   Lab Work: TO BE DONE IN JUNE: CMET, LIPIDS If you have labs (blood work) drawn today and your tests are completely normal, you will receive your results only by: MyChart Message (if you have MyChart) OR A paper copy in the mail If you have any lab test that is abnormal or we need to change your treatment, we will call you to review the results.   Testing/Procedures: NONE   Follow-Up: At [facility], you and your health needs are our priority.  As part of our continuing mission to provide you with exceptional heart care, we have created designated Provider Care Teams.  These Care Teams include your primary Cardiologist (physician) and Advanced Practice Providers (APPs -  Physician Assistants and Nurse Practitioners) who all work together to provide you with the care you need, when you need it.  We recommend signing up for the patient portal called "MyChart".  Sign up information is provided on this After Visit Summary.  MyChart is used to connect with patients for Virtual Visits (Telemedicine).  Patients are able to view lab/test results, encounter notes, upcoming appointments, etc.  Non-urgent messages can be sent to your provider as well.   To learn more about what you can do with MyChart, go to [email or web address].    Your next appointment:   3 month(s)  Provider:   Kristeen Miss, MD  or Elyse Jarvis, NP

## 2022-07-07 DIAGNOSIS — R350 Frequency of micturition: Secondary | ICD-10-CM | POA: Diagnosis not present

## 2022-07-07 DIAGNOSIS — R300 Dysuria: Secondary | ICD-10-CM | POA: Diagnosis not present

## 2022-07-07 DIAGNOSIS — G894 Chronic pain syndrome: Secondary | ICD-10-CM | POA: Diagnosis not present

## 2022-07-07 DIAGNOSIS — Z981 Arthrodesis status: Secondary | ICD-10-CM | POA: Diagnosis not present

## 2022-07-07 DIAGNOSIS — Z9889 Other specified postprocedural states: Secondary | ICD-10-CM | POA: Diagnosis not present

## 2022-07-13 ENCOUNTER — Other Ambulatory Visit (HOSPITAL_COMMUNITY): Payer: Self-pay

## 2022-07-18 ENCOUNTER — Other Ambulatory Visit (HOSPITAL_COMMUNITY): Payer: Self-pay

## 2022-07-18 DIAGNOSIS — R3912 Poor urinary stream: Secondary | ICD-10-CM | POA: Diagnosis not present

## 2022-07-19 ENCOUNTER — Other Ambulatory Visit (HOSPITAL_COMMUNITY): Payer: Self-pay

## 2022-07-19 MED ORDER — HYDROMORPHONE HCL 4 MG PO TABS
4.0000 mg | ORAL_TABLET | Freq: Three times a day (TID) | ORAL | 0 refills | Status: DC | PRN
  Filled 2022-07-19: qty 90, 30d supply, fill #0

## 2022-07-20 ENCOUNTER — Other Ambulatory Visit (HOSPITAL_COMMUNITY): Payer: Self-pay

## 2022-08-02 ENCOUNTER — Ambulatory Visit: Payer: Medicare Other | Attending: Cardiovascular Disease

## 2022-08-02 ENCOUNTER — Ambulatory Visit: Payer: Medicare Other | Admitting: Nurse Practitioner

## 2022-08-02 DIAGNOSIS — D649 Anemia, unspecified: Secondary | ICD-10-CM

## 2022-08-02 DIAGNOSIS — I10 Essential (primary) hypertension: Secondary | ICD-10-CM | POA: Diagnosis not present

## 2022-08-02 DIAGNOSIS — I25119 Atherosclerotic heart disease of native coronary artery with unspecified angina pectoris: Secondary | ICD-10-CM | POA: Diagnosis not present

## 2022-08-02 DIAGNOSIS — E782 Mixed hyperlipidemia: Secondary | ICD-10-CM

## 2022-08-03 ENCOUNTER — Telehealth: Payer: Self-pay | Admitting: *Deleted

## 2022-08-03 DIAGNOSIS — E875 Hyperkalemia: Secondary | ICD-10-CM

## 2022-08-03 LAB — COMPREHENSIVE METABOLIC PANEL
ALT: 20 IU/L (ref 0–44)
AST: 34 IU/L (ref 0–40)
Albumin/Globulin Ratio: 1.6 (ref 1.2–2.2)
Albumin: 4.4 g/dL (ref 3.9–4.9)
Alkaline Phosphatase: 73 IU/L (ref 44–121)
BUN/Creatinine Ratio: 11 (ref 10–24)
BUN: 13 mg/dL (ref 8–27)
Bilirubin Total: 0.3 mg/dL (ref 0.0–1.2)
CO2: 23 mmol/L (ref 20–29)
Calcium: 9.9 mg/dL (ref 8.6–10.2)
Chloride: 104 mmol/L (ref 96–106)
Creatinine, Ser: 1.19 mg/dL (ref 0.76–1.27)
Globulin, Total: 2.8 g/dL (ref 1.5–4.5)
Glucose: 133 mg/dL — ABNORMAL HIGH (ref 70–99)
Potassium: 5.4 mmol/L — ABNORMAL HIGH (ref 3.5–5.2)
Sodium: 142 mmol/L (ref 134–144)
Total Protein: 7.2 g/dL (ref 6.0–8.5)
eGFR: 67 mL/min/{1.73_m2} (ref >59)

## 2022-08-03 LAB — CBC
Hematocrit: 42.9 % (ref 37.5–51.0)
Hemoglobin: 13.5 g/dL (ref 13.0–17.7)
MCH: 26.3 pg — ABNORMAL LOW (ref 26.6–33.0)
MCHC: 31.5 g/dL (ref 31.5–35.7)
MCV: 84 fL (ref 79–97)
Platelets: 328 10*3/uL (ref 150–450)
RBC: 5.14 x10E6/uL (ref 4.14–5.80)
RDW: 14.2 % (ref 11.6–15.4)
WBC: 7.3 10*3/uL (ref 3.4–10.8)

## 2022-08-03 LAB — LIPID PANEL
Chol/HDL Ratio: 3.5 ratio (ref 0.0–5.0)
Cholesterol, Total: 154 mg/dL (ref 100–199)
HDL: 44 mg/dL (ref >39)
LDL Chol Calc (NIH): 80 mg/dL (ref 0–99)
Triglycerides: 174 mg/dL — ABNORMAL HIGH (ref 0–149)
VLDL Cholesterol Cal: 30 mg/dL (ref 5–40)

## 2022-08-03 NOTE — Telephone Encounter (Signed)
Patient continues to feel fatigued. Agree with plan from Lendon Ka, RN to reduce metoprolol to 12.5 mg twice daily. Prefer he try not to stop at this time due to recent surgery. If he continues to feel dizzy at this dose, lets try metoprolol succinate 25 mg at bedtime.  Mildly elevated potassium on lab results 08/02/22. Stable renal function. RN has ruled out most common sources of extra potassium. Would recommend that wife looks up the things he eats most commonly to ensure no extra potassium.  Recheck in 7-10 days. Triglycerides are elevated > 150. LDL is about the same as on 05/15/22. Is he consistent with Repatha. Would recommend that we add atorvastatin 40 mg daily since LDL goal is 55 or <. If he is not agreeable to atorvastatin (can add any dose he is willing) would recommend ezetimibe 10 mg daily.

## 2022-08-03 NOTE — Telephone Encounter (Signed)
Called and spoke w patient's wife re: elevated potassium in labs yesterday. While on the phone she question/concern for provider:  Patient is dizzy every time he gets up.  She wonders if it is the metoprolol.  Whether BP is 90s/60s or 130s /80s he is still dizzy every time.  She had him hold 3 doses and he was not dizzy and he had more energy.  His HR went up to 80s-90s which made his breathing a [place] labored.  Otherwise he felt good.  I adv I would let his providers know and for now to have him decrease to 1/2 tablet twice daily and we will call them back once reviewed.

## 2022-08-04 NOTE — Telephone Encounter (Signed)
LDL should remain low regardless of day of the cycle on Repatha. I would recommend at least low dose statin to increase the effectiveness.

## 2022-08-04 NOTE — Telephone Encounter (Signed)
Spoke w the patient's wife. She will look out for potassium levels in foods that he eats.  Will repeat labs 08/11/22.  Appointment scheduled.    He has intolerances to statins for joint and muscle pain.  He has tried Crestor and atorvastatin but unclear of the doses.  They think he tolerated either pravastatin or simvastatin.  Will send a message when they review his records to let us know.  Also may be willing to retry one at lower than previously prescribed dose.

## 2022-08-07 ENCOUNTER — Telehealth: Payer: Self-pay | Admitting: Cardiovascular Disease

## 2022-08-07 MED ORDER — PRAVASTATIN SODIUM 40 MG PO TABS: 40.0000 mg | ORAL_TABLET | Freq: Every evening | ORAL | 3 refills | Status: DC

## 2022-08-07 NOTE — Telephone Encounter (Signed)
Pts wife on DPR advised and verbalized understanding... she asks to just sent in 30 days in case he does not tolerate.. sent to his Pharmacy.

## 2022-08-07 NOTE — Addendum Note (Signed)
Addended by: Bertram Millard on: 08/07/2022 12:09 PM   Modules accepted: Orders

## 2022-08-07 NOTE — Telephone Encounter (Signed)
Pt's wife returning nurses call. Please advise.  

## 2022-08-07 NOTE — Telephone Encounter (Signed)
Would recommend he start pravastatin 40 mg in addition to Repatha. If he does not tolerate 40 mg, he can reduce to 20 mg daily and continue Repatha.

## 2022-08-07 NOTE — Telephone Encounter (Signed)
Left message on machine for pt to contact the office.   

## 2022-08-11 ENCOUNTER — Ambulatory Visit: Payer: Medicare Other | Attending: Nurse Practitioner

## 2022-08-11 DIAGNOSIS — E875 Hyperkalemia: Secondary | ICD-10-CM | POA: Diagnosis not present

## 2022-08-12 LAB — BASIC METABOLIC PANEL
BUN/Creatinine Ratio: 13 (ref 10–24)
BUN: 16 mg/dL (ref 8–27)
CO2: 21 mmol/L (ref 20–29)
Calcium: 10 mg/dL (ref 8.6–10.2)
Chloride: 106 mmol/L (ref 96–106)
Creatinine, Ser: 1.22 mg/dL (ref 0.76–1.27)
Glucose: 144 mg/dL — ABNORMAL HIGH (ref 70–99)
Potassium: 5.6 mmol/L — ABNORMAL HIGH (ref 3.5–5.2)
Sodium: 142 mmol/L (ref 134–144)
eGFR: 65 mL/min/{1.73_m2} (ref >59)

## 2022-08-17 ENCOUNTER — Other Ambulatory Visit: Payer: Self-pay | Admitting: *Deleted

## 2022-08-17 DIAGNOSIS — E875 Hyperkalemia: Secondary | ICD-10-CM

## 2022-09-06 ENCOUNTER — Encounter: Payer: Self-pay | Admitting: Cardiovascular Disease

## 2022-09-06 DIAGNOSIS — Z9889 Other specified postprocedural states: Secondary | ICD-10-CM | POA: Diagnosis not present

## 2022-09-06 DIAGNOSIS — G894 Chronic pain syndrome: Secondary | ICD-10-CM | POA: Diagnosis not present

## 2022-09-06 DIAGNOSIS — M5416 Radiculopathy, lumbar region: Secondary | ICD-10-CM | POA: Diagnosis not present

## 2022-09-06 MED ORDER — METOPROLOL TARTRATE 25 MG PO TABS: 12.5000 mg | ORAL_TABLET | Freq: Two times a day (BID) | ORAL | 2 refills | Status: DC

## 2022-09-06 MED ORDER — METOPROLOL TARTRATE 25 MG PO TABS: 25.0000 mg | ORAL_TABLET | Freq: Two times a day (BID) | ORAL | 10 refills | Status: DC

## 2022-09-06 NOTE — Addendum Note (Signed)
Addended by: Dossie Arbour on: 09/06/2022 01:59 PM   Modules accepted: Orders

## 2022-09-06 NOTE — Telephone Encounter (Signed)
Per 6/6 phone note Patient continues to feel fatigued. Agree with plan from Lendon Ka, RN to reduce metoprolol to 12.5 mg twice daily. Prefer he try not to stop at this time due to recent surgery. If he continues to feel dizzy at this dose, lets try metoprolol succinate 25 mg at bedtime.

## 2022-09-13 ENCOUNTER — Ambulatory Visit: Payer: Medicare Other | Attending: Cardiology

## 2022-09-13 ENCOUNTER — Telehealth: Payer: Self-pay

## 2022-09-13 DIAGNOSIS — E875 Hyperkalemia: Secondary | ICD-10-CM

## 2022-09-13 NOTE — Telephone Encounter (Signed)
   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 Francena Hanly Surgeon's Group or Practice Name:  [facility] Phone number:  [phone] Fax number:  [phone]   Type of Clearance Requested:   - Medical    Type of Anesthesia:  Not Indicated   Additional requests/questions:    Garrel Ridgel   09/13/2022, 3:24 PM

## 2022-09-13 NOTE — Telephone Encounter (Signed)
   Name: Victor [name]  DOB: 1955/09/22  MRN: [id number]  Primary Cardiologist: Kristeen Miss, MD   Preoperative team, please contact this patient and set up a phone call appointment for further preoperative risk assessment. Please obtain consent and complete medication review. Thank you for your help.  I confirm that guidance regarding antiplatelet and oral anticoagulation therapy has been completed and, if necessary, noted below.  None requested    Ronney Asters, NP 09/13/2022, 3:41 PM [facility]

## 2022-09-13 NOTE — Telephone Encounter (Signed)
  Patient Consent for Virtual Visit        Victor [name] has provided verbal consent on 09/13/2022 for a virtual visit (video or telephone).   CONSENT FOR VIRTUAL VISIT FOR:  Victor [name]  By participating in this virtual visit I agree to the following:  I hereby voluntarily request, consent and authorize [facility] and its employed or contracted physicians, physician assistants, nurse practitioners or other licensed health care professionals (the Practitioner), to provide me with telemedicine health care services (the "Services") as deemed necessary by the treating Practitioner. I acknowledge and consent to receive the Services by the Practitioner via telemedicine. I understand that the telemedicine visit will involve communicating with the Practitioner through live audiovisual communication technology and the disclosure of certain medical information by electronic transmission. I acknowledge that I have been given the opportunity to request an in-person assessment or other available alternative prior to the telemedicine visit and am voluntarily participating in the telemedicine visit.  I understand that I have the right to withhold or withdraw my consent to the use of telemedicine in the course of my care at any time, without affecting my right to future care or treatment, and that the Practitioner or I may terminate the telemedicine visit at any time. I understand that I have the right to inspect all information obtained and/or recorded in the course of the telemedicine visit and may receive copies of available information for a reasonable fee.  I understand that some of the potential risks of receiving the Services via telemedicine include:  Delay or interruption in medical evaluation due to technological equipment failure or disruption; Information transmitted may not be sufficient (e.g. poor resolution of images) to allow for appropriate medical decision making by the  Practitioner; and/or  In rare instances, security protocols could fail, causing a breach of personal health information.  Furthermore, I acknowledge that it is my responsibility to provide information about my medical history, conditions and care that is complete and accurate to the best of my ability. I acknowledge that Practitioner's advice, recommendations, and/or decision may be based on factors not within their control, such as incomplete or inaccurate data provided by me or distortions of diagnostic images or specimens that may result from electronic transmissions. I understand that the practice of medicine is not an exact science and that Practitioner makes no warranties or guarantees regarding treatment outcomes. I acknowledge that a copy of this consent can be made available to me via my patient portal [facility] MyChart), or I can request a printed copy by calling the office of [facility].    I understand that my insurance will be billed for this visit.   I have read or had this consent read to me. I understand the contents of this consent, which adequately explains the benefits and risks of the Services being provided via telemedicine.  I have been provided ample opportunity to ask questions regarding this consent and the Services and have had my questions answered to my satisfaction. I give my informed consent for the services to be provided through the use of telemedicine in my medical care

## 2022-09-13 NOTE — Telephone Encounter (Signed)
Patient has been scheduled 7/31/4 for pre-op tele visit. Consent provided

## 2022-09-14 LAB — BASIC METABOLIC PANEL
BUN/Creatinine Ratio: 15 (ref 10–24)
BUN: 16 mg/dL (ref 8–27)
CO2: 22 mmol/L (ref 20–29)
Calcium: 9.3 mg/dL (ref 8.6–10.2)
Chloride: 102 mmol/L (ref 96–106)
Creatinine, Ser: 1.1 mg/dL (ref 0.76–1.27)
Glucose: 119 mg/dL — ABNORMAL HIGH (ref 70–99)
Potassium: 3.9 mmol/L (ref 3.5–5.2)
Sodium: 138 mmol/L (ref 134–144)
eGFR: 74 mL/min/{1.73_m2} (ref >59)

## 2022-09-25 DIAGNOSIS — M25512 Pain in left shoulder: Secondary | ICD-10-CM | POA: Diagnosis not present

## 2022-09-27 ENCOUNTER — Ambulatory Visit: Payer: Medicare Other

## 2022-10-01 ENCOUNTER — Encounter: Payer: Self-pay | Admitting: Cardiovascular Disease

## 2022-10-01 NOTE — Progress Notes (Unsigned)
Cardiology Office Note:    Date:  10/02/2022   ID:  Victor [name], DOB 1955/04/11, MRN [id number]  PCP:  Tally Joe, MD   [facility] Providers Cardiologist:      Referring MD: Tally Joe, MD   Chief Complaint  Patient presents with   Coronary Artery Disease        Hypertension         History of Present Illness:    Victor [name] is a 67 y.o. male with a hx of hyperlipidemia and calcifications of the aorta.  We are asked to see him today for further evaluation.  Seen with wife, Victor [name] ( patient of mine)  He thinks I saw him for a pre op clearance in 1990s.  He was found to have some incidental calcium on his aorta.  He had a coronary calcium test which revealed a coronary calcium score of 2535 which places him in the 98th percentile for age/sex matched controls.  No chest pain  Has a funny feeling in his chest ,  central feeling .   Not necessarily with activity Has lots of feet , leg pain ,  Does not walk much ,  can do a stationary bike   Has occasional "sinking " spells - may be hypoglycemia  Feels better after drinking a coke .     His recent hyperlipidemia.  Recent lipids reveal Total cholesterol of 216 HDL is 42 Triglyceride level is 237 LDL is 132.  Was on Simvastatin but is no longer   Will starteon rosuvastatin check labs in 3 months.  Dec. 18, 2023 Victor [name] is seen after his abnormal PET scan and subsequent cath .  He had a coronary calcium test which revealed a coronary calcium score of 2535 which places him in the 98th percentile for age/sex matched controls.  Cath - RCA : occluded prox LAD - 70% prox, D1 90% LCx - 80 % mid stenosis,  OM1 has a 80% stenosis   EF is 52% by PET scan (mildly reduced )   refer to [facility] for consideration for CABG   Has been generally intolerant to rosuvastatin  Is not tolerating the rosuvastatin    June 20, 2021 Victor [name] is seen for follow up of his CAD, CABG CABG March  22 Is out walking some , Has orthostasis  Is eating and drinking normally now    Aug. 5, 2024 Victor [name] is seen for follow-up of his coronary artery disease and coronary artery bypass grafting. Minimal soreness Getting some exercise   Some post surgical soreness, no angina   He needs to have shoulder surgery  He may hold his ASA for 5-7 days  He is at low risk for his shoulder surgery   His last LDL is 80 Is now on repatha And pravachol   Recheck lipids today        Past Medical History:  Diagnosis Date   Allergic rhinitis    Anxiety    Arteriosclerosis of thoracic aorta (HCC)    Arthritis    "all over" (04/15/2013)   Back pain, chronic    BMI 32.0-32.9,adult    Cervicalgia    Chronic neck pain    Coronary artery disease 2023   COVID-19    DDD (degenerative disc disease), cervical    DDD (degenerative disc disease), lumbar    Diverticulosis    Elevated hemoglobin (HCC)    Fibromyalgia    GERD (gastroesophageal reflux disease)    Gout  Headache    High cholesterol    History of bronchitis    Hyperkalemia    Hypertension    Insomnia    Kidney disease with benign hypertension    Leg cramps    Right greater than left   Low back pain    Low testosterone    Melanoma (HCC)    Nose   MRSA infection    Neuropathy of both feet    Peripheral vascular disease (HCC)    Personal history of colonic polyps    Polycythemia    Pre-diabetes    Radiculopathy of lumbar region    Raynaud phenomenon    Recurrent sinus infections    Right sided sciatica    Sleep apnea    "don't use CPAP" (04/15/2013)   Tobacco dependence     Past Surgical History:  Procedure Laterality Date   ANTERIOR CERVICAL DECOMP/DISCECTOMY FUSION  2000's X3   "had 3 surgeries on my front neck" (04/15/2013)   ANTERIOR LAT LUMBAR FUSION Left 08/06/2017   Procedure: LEFT LUMBAR TWO-THREE ANTERIOR LATERAL LUMBAR FUSION WITH PERCUTANEOUS SCREWS;  Surgeon: Julio Sicks, MD;  Location: [facility] OR;   Service: Neurosurgery;  Laterality: Left;   APPENDECTOMY  ~ 2010   BACK SURGERY  05/31/2021   Lumbar fusion L4-L5 L5-S1 with Dr. Laurell Roof TUNNEL RELEASE Left 1990's   CATARACT EXTRACTION, BILATERAL     COLONOSCOPY     CORONARY ARTERY BYPASS GRAFT N/A 05/19/2022   Procedure: CORONARY ARTERY BYPASS GRAFTING (CABG) X FIVE BYPASSES USING OPEN LEFT INTERNAL MAMMARY ARTERY AND ENDOSCOPIC LEFT GREATER SAPHENOUS VEIN HARVEST;  Surgeon: Alleen Borne, MD;  Location: [facility] OR;  Service: Open Heart Surgery;  Laterality: N/A;   CORONARY PRESSURE/FFR STUDY N/A 02/03/2022   Procedure: INTRAVASCULAR PRESSURE WIRE/FFR STUDY;  Surgeon: Kathleene Hazel, MD;  Location: [facility] INVASIVE CV LAB;  Service: Cardiovascular;  Laterality: N/A;   ear drum surgery  1974   EYE SURGERY Right    FLEXIBLE SIGMOIDOSCOPY  12/21/2011   Procedure: FLEXIBLE SIGMOIDOSCOPY;  Surgeon: Shirley Friar, MD;  Location: [facility];  Service: Endoscopy;  Laterality: N/A;   HAND TENDON SURGERY Left ~ 1978 X2-1980's X3   "had 5 ORs after laceration"   HOT HEMOSTASIS  12/21/2011   Procedure: HOT HEMOSTASIS (ARGON PLASMA COAGULATION/BICAP);  Surgeon: Shirley Friar, MD;  Location: [facility];  Service: Endoscopy;  Laterality: N/A;   INGUINAL HERNIA REPAIR Left 04/15/2013   Procedure: LAPAROSCOPIC REPAIR OF INCARCERATED LEFT INGUINAL HERNIA;  Surgeon: Atilano Ina, MD;  Location: [facility] OR;  Service: General;  Laterality: Left;   INGUINAL HERNIA REPAIR Bilateral 1970's - 1984   INGUINAL HERNIA REPAIR Left 04/15/2013   INSERTION OF MESH Left 04/15/2013   Procedure: INSERTION OF MESH;  Surgeon: Atilano Ina, MD;  Location: [facility] OR;  Service: General;  Laterality: Left;   JOINT REPLACEMENT     right knee   KNEE ARTHROSCOPY Right 1990's   LAPAROSCOPY N/A 04/15/2013   Procedure: LAPAROSCOPY DIAGNOSTIC;  Surgeon: Atilano Ina, MD;  [facility];  Service: General;  Laterality: N/A;   LEFT HEART CATH AND CORONARY  ANGIOGRAPHY N/A 02/03/2022   Procedure: LEFT HEART CATH AND CORONARY ANGIOGRAPHY;  Surgeon: Kathleene Hazel, MD;  Location: [facility] INV[facility];  Service: Cardiovascular;  Laterality: N/A;   LUMBAR LAMINECTOMY/DECOMPRESSION MICRODISCECTOMY Right 07/22/2015   Procedure: Right Lumbar Two-Three Microdiscectomy;  Surgeon: Julio Sicks, MD;  Location: [facility] NEURO ORS;  Service: Neurosurgery;  Laterality:  Right;   LUMBAR LAMINECTOMY/DECOMPRESSION MICRODISCECTOMY Right 09/05/2016   Procedure: Microdiscectomy - right - T12-L1;  Surgeon: Julio Sicks, MD;  Location[facility] OR;  Service: Neurosurgery;  Laterality: Right;   NASAL SEPTUM SURGERY Bilateral 2014   "removed polyps & infection"   POLYPECTOMY  01/10/2011   Procedure: POLYPECTOMY;  Surgeon: Shirley Friar, MD;  Location: [facility];  Service: Endoscopy;  Laterality: N/A;   POSTERIOR CERVICAL LAMINECTOMY Left 05/24/2020   Procedure: Laminectomy and Foraminotomy - left - Cervical two-Cervical three;  Surgeon: Julio Sicks, MD;  Location: [facility] OR;  Service: Neurosurgery;  Laterality: Left;   POSTERIOR LUMBAR FUSION  11/1995   "ray cages" (04/15/2013)   SHOULDER ARTHROSCOPY Bilateral    "twice each"   TEE WITHOUT CARDIOVERSION N/A 05/19/2022   Procedure: TRANSESOPHAGEAL ECHOCARDIOGRAM;  Surgeon: Alleen Borne, MD;  Location: [facility] OR;  Service: Open Heart Surgery;  Laterality: N/A;   TONSILLECTOMY  1960's   TOTAL KNEE ARTHROPLASTY Right 02/24/2014   Procedure: TOTAL RIGHT KNEE ARTHROPLASTY;  Surgeon: Shelda Pal, MD;  Location: [facility] ORS;  Service: Orthopedics;  Laterality: Right;   TOTAL SHOULDER ARTHROPLASTY Left 09/18/2019   Procedure: TOTAL SHOULDER ARTHROPLASTY;  Surgeon: Francena Hanly, MD;  Location: [facility] ORS;  Service: Orthopedics;  Laterality: Left;    TOTAL SHOULDER ARTHROPLASTY Right 11/04/2020   Procedure: TOTAL SHOULDER ARTHROPLASTY;  Surgeon: Francena Hanly, MD;  Location: [facility] ORS;  Service: Orthopedics;  Laterality: Right;     UPPER GI ENDOSCOPY     VASECTOMY  1988   WISDOM TOOTH EXTRACTION  2000's    Current Medications: Current Meds  Medication Sig   acetaminophen (TYLENOL) 500 MG tablet Take 500-1,000 mg by mouth every 8 (eight) hours as needed for moderate pain.   allopurinol (ZYLOPRIM) 300 MG tablet TAKE 1 AND 1/2 TABLETS BY MOUTH DAILY   ALPRAZolam (XANAX) 1 MG tablet Take 1 mg by mouth 2 (two) times daily as needed for anxiety.   amLODipine (NORVASC) 5 MG tablet Take 5 mg by mouth daily.   aspirin EC 325 MG tablet Take 1 tablet (325 mg total) by mouth daily.   b complex vitamins tablet Take 1 tablet by mouth in the morning.   Biotin 5000 MCG CAPS Take 5,000 mcg by mouth in the morning.   Calcium Carbonate (CALCIUM 600 PO) Take 600 mg by mouth in the morning.   carboxymethylcellulose (REFRESH PLUS) 0.5 % SOLN Place 1 drop into both eyes 2 (two) times daily as needed (Dry eyes).   colchicine 0.6 MG tablet TAKE 1 TABLET (0.6 MG TOTAL) BY MOUTH 2 (TWO) TIMES DAILY AS NEEDED.   cyclobenzaprine (FLEXERIL) 10 MG tablet Take 10 mg by mouth 2 (two) times daily as needed for muscle spasms.   Evolocumab (REPATHA SURECLICK) 140 MG/ML SOAJ Inject 140 mg into the skin every 14 (fourteen) days.   fish oil-omega-3 fatty acids 1000 MG capsule Take 1 g by mouth in the morning and at bedtime.   fluticasone (FLONASE) 50 MCG/ACT nasal spray Place 1 spray into both nostrils 2 (two) times daily. For congestion   GINSENG PO Take 400 mg by mouth in the morning.   guaiFENesin (MUCINEX) 600 MG 12 hr tablet Take 600 mg by mouth at bedtime as needed (Congestion).   hydrocortisone cream 1 % Apply 1 application topically daily as needed for itching.   loratadine (CLARITIN) 10 MG tablet Take 10 mg by mouth daily.   magnesium gluconate (MAGONATE) 500 MG tablet Take 500 mg by  mouth in the morning.   Melatonin 10 MG TABS Take 10 mg by mouth at bedtime as needed (sleep).   Methylsulfonylmethane (MSM) 1000 MG CAPS Take 1,000 mg by mouth  in the morning.   metoprolol tartrate (LOPRESSOR) 25 MG tablet Take 0.5 tablets (12.5 mg total) by mouth 2 (two) times daily.   Multiple Vitamins-Minerals (MULTIVITAMIN WITH MINERALS) tablet Take 1 tablet by mouth daily.   nitroGLYCERIN (NITROSTAT) 0.4 MG SL tablet Dissolve 1 tablet under the tongue every 5 minutes as needed for chest pain. Max of 3 doses, then 911.   NONFORMULARY OR COMPOUNDED ITEM Apply 1 g topically daily as needed (foot discomfort/pain.). Doxepin 5%, Amantadine 3%, Pentoxifylline 3%, Lamotrigine 2%, Sertraline 3%, Gabapentin 6% PCCA Lipoderm Base Cream   omeprazole (PRILOSEC) 40 MG capsule Take 40 mg by mouth in the morning.   Oxycodone HCl 10 MG TABS Take 1 tablet by mouth daily.   pravastatin (PRAVACHOL) 40 MG tablet Take 1 tablet (40 mg total) by mouth every evening.   Psyllium (METAMUCIL FIBER PO) Take 1 Scoop by mouth daily in the afternoon.   selenium 200 MCG TABS tablet Take 200 mcg by mouth in the morning.   sildenafil (VIAGRA) 100 MG tablet Take 100 mg by mouth daily as needed for erectile dysfunction. Per patient taking 1/2 tablet (50 mg) as needed   silodosin (RAPAFLO) 8 MG CAPS capsule Take 8 mg by mouth at bedtime.   sodium chloride (OCEAN) 0.65 % SOLN nasal spray Place 1 spray into both nostrils as needed for congestion.   testosterone cypionate (DEPOTESTOTERONE CYPIONATE) 200 MG/ML injection Inject 100 mg into the muscle every 14 (fourteen) days. 0.5 mL   Turmeric 500 MG CAPS Take 500 mg by mouth in the morning.   venlafaxine (EFFEXOR-XR) 150 MG 24 hr capsule Take 150 mg by mouth daily with breakfast.   Vitamin D3 (VITAMIN D) 25 MCG tablet Take 1,000 Units by mouth in the morning.   vitamin E 400 UNIT capsule Take 400 Units by mouth in the morning.     Allergies:   Tetracyclines & related, Amoxicillin, Crestor [rosuvastatin], Doxycycline, and Niacin and related   Social History   Socioeconomic History   Marital status: Married    Spouse name: Debbie    Number of children: 2   Years of education: Not on file   Highest education level: Not on file  Occupational History   Not on file  Tobacco Use   Smoking status: Former    Current packs/day: 0.00    Average packs/day: 0.1 packs/day for 15.0 years (1.5 ttl pk-yrs)    Types: Cigarettes, Cigars    Start date: 01/09/1995    Quit date: 01/08/2010    Years since quitting: 12.7    Passive exposure: Never   Smokeless tobacco: Former    Types: Chew   Tobacco comments:    04/15/2013 "smoked ~ 1 pack/month"  Vaping Use   Vaping status: Never Used  Substance and Sexual Activity   Alcohol use: Yes    Alcohol/week: 5.0 standard drinks of alcohol    Types: 1 Glasses of wine, 4 Cans of beer per week    Comment: occasional   Drug use: No   Sexual activity: Yes  Other Topics Concern   Not on file  Social History Narrative   Not on file   Social Determinants of Health   Financial Resource Strain: Not on file  Food Insecurity: No Food Insecurity (05/21/2022)   Hunger Vital Sign  Worried About Programme researcher, broadcasting/film/video in the Last Year: Never true    Ran Out of Food in the Last Year: Never true  Transportation Needs: No Transportation Needs (05/21/2022)   PRAPARE - Administrator, Civil Service (Medical): No    Lack of Transportation (Non-Medical): No  Physical Activity: Not on file  Stress: Not on file  Social Connections: Not on file     Family History: The patient's family history includes Allergies in his daughter; Alzheimer's disease in his mother; Cancer in his mother; Heart Problems in his mother; Hypertension in his father; Interstitial cystitis in his daughter.  ROS:   Please see the history of present illness.     All other systems reviewed and are negative.  EKGs/Labs/Other Studies Reviewed:    The following studies were reviewed today:   EKG:   Recent Labs: 05/20/2022: Magnesium 2.2 08/02/2022: ALT 20; Hemoglobin 13.5; Platelets 328 09/13/2022: BUN 16;  Creatinine, Ser 1.10; Potassium 3.9; Sodium 138  Recent Lipid Panel    Component Value Date/Time   CHOL 154 08/02/2022 1150   TRIG 174 (H) 08/02/2022 1150   HDL 44 08/02/2022 1150   CHOLHDL 3.5 08/02/2022 1150   LDLCALC 80 08/02/2022 1150     Risk Assessment/Calculations:        Physical Exam:     Physical Exam: Blood pressure 114/80, pulse 70, height 5\' 8"  (1.727 m), weight 226 lb (102.5 kg), SpO2 98%.       GEN:  Well nourished, well developed in no acute distress HEENT: Normal NECK: No JVD; No carotid bruits LYMPHATICS: No lymphadenopathy CARDIAC: RRR , sternotomy is healing well  RESPIRATORY:  Clear to auscultation without rales, wheezing or rhonchi  ABDOMEN: Soft, non-tender, non-distended MUSCULOSKELETAL:  No edema; No deformity  SKIN: Warm and dry NEUROLOGIC:  Alert and oriented x 3   ASSESSMENT:    No diagnosis found.    PLAN:     Coronary artery disease: He is doing well following his coronary artery bypass grafting 5 months ago.  He needs to have left shoulder replacement.  He is at low risk for his upcoming shoulder replacement.  It would be okay for him to hold his aspirin for 5 to 7 days prior to his shoulder surgery.    2.  Hyperlipidemia:     On Repatha and Pravachol.  His last LDL was 80.  Will recheck lipids today.   3.  Postoperative atrial fibrillation:   No episodes of recurrent atrial fibrillation.               Medication Adjustments/Labs and Tests Ordered: Current medicines are reviewed at length with the patient today.  Concerns regarding medicines are outlined above.  No orders of the defined types were placed in this encounter.  No orders of the defined types were placed in this encounter.   There are no Patient Instructions on file for this visit.   Signed, Kristeen Miss, MD  10/02/2022 11:15 AM    [facility]

## 2022-10-02 ENCOUNTER — Encounter: Payer: Self-pay | Admitting: Cardiovascular Disease

## 2022-10-02 ENCOUNTER — Ambulatory Visit: Payer: Medicare Other | Admitting: Cardiovascular Disease

## 2022-10-02 VITALS — BP 114/80 | HR 70 | Ht 68.0 in | Wt 226.0 lb

## 2022-10-02 DIAGNOSIS — Z01810 Encounter for preprocedural cardiovascular examination: Secondary | ICD-10-CM | POA: Diagnosis not present

## 2022-10-02 DIAGNOSIS — E782 Mixed hyperlipidemia: Secondary | ICD-10-CM | POA: Diagnosis not present

## 2022-10-02 DIAGNOSIS — Z951 Presence of aortocoronary bypass graft: Secondary | ICD-10-CM | POA: Diagnosis not present

## 2022-10-02 DIAGNOSIS — I10 Essential (primary) hypertension: Secondary | ICD-10-CM | POA: Diagnosis not present

## 2022-10-02 NOTE — Telephone Encounter (Signed)
Victor [name] is at low risk for his upcoming shoulder surgery.  It would be okay for him to hold his aspirin for 5 to 7 days prior to surgery.  He is at low risk.

## 2022-10-02 NOTE — Telephone Encounter (Signed)
   Name: Victor [name]  DOB: 1955/12/23  MRN: [id number]   Primary Cardiologist: Kristeen Miss, MD  Chart reviewed as part of pre-operative protocol coverage. Victor [name] was last seen on 10/02/2022 by Dr. Elease Hashimoto.  Per Dr. Elease Hashimoto, patient "is at low risk for his upcoming shoulder surgery. It would be okay for him to hold his aspirin for 5 to 7 days prior to surgery. He is at low risk."   Therefore, based on ACC/AHA guidelines, the patient would be at acceptable risk for the planned procedure without further cardiovascular testing.   Per office protocol, he may hold aspirin for 5-7 days prior to procedure and should resume as soon as hemodynamically stable postoperatively.   I will route this recommendation to the requesting party via Epic fax function and remove from pre-op pool. Please call with questions.  Carlos Levering, NP 10/02/2022, 11:53 AM

## 2022-10-02 NOTE — Patient Instructions (Signed)
Medication Instructions:  Your physician recommends that you continue on your current medications as directed. Please refer to the Current Medication list given to you today.  *If you need a refill on your cardiac medications before your next appointment, please call your pharmacy*   Lab Work: Lipids, ALT, BMET today If you have labs (blood work) drawn today and your tests are completely normal, you will receive your results only by: [place] (if you have MyChart) OR A paper copy in the mail If you have any lab test that is abnormal or we need to change your treatment, we will call you to review the results.   Testing/Procedures: NONE   Follow-Up: At [facility], you and your health needs are our priority.  As part of our continuing mission to provide you with exceptional heart care, we have created designated Provider Care Teams.  These Care Teams include your primary Cardiologist (physician) and Advanced Practice Providers (APPs -  Physician Assistants and Nurse Practitioners) who all work together to provide you with the care you need, when you need it.  We recommend signing up for the patient portal called "MyChart".  Sign up information is provided on this After Visit Summary.  MyChart is used to connect with patients for Virtual Visits (Telemedicine).  Patients are able to view lab/test results, encounter notes, upcoming appointments, etc.  Non-urgent messages can be sent to your provider as well.   To learn more about what you can do with MyChart, go to [email or web address].    Your next appointment:   1 year(s)  Provider:   Mertie Moores, MD

## 2022-10-03 ENCOUNTER — Telehealth: Payer: Self-pay

## 2022-10-03 ENCOUNTER — Telehealth: Payer: Self-pay | Admitting: Internal Medicine

## 2022-10-03 ENCOUNTER — Other Ambulatory Visit: Payer: Self-pay

## 2022-10-03 DIAGNOSIS — E875 Hyperkalemia: Secondary | ICD-10-CM

## 2022-10-03 NOTE — Telephone Encounter (Signed)
Paged by [facility]. Patients potassium elevated to 5.6. He has not had any symptoms. He has had elevated potassium in the past. I recommended he come to the ED to get it rechecked to make sure it is not getting worse or he is having renal injury. I did explain that when potassium gets to high it can cause life threatening arrhythmia. All questions were answered.

## 2022-10-03 NOTE — Telephone Encounter (Addendum)
-----   Message from Kristeen Miss sent at 10/03/2022  8:15 AM EDT ----- I suspect the elevated potassium level is hemolyzed ( his creatinine has not significantly changed)  Please have him repeat a BMP in the next several days   Patient's wife returned call to office, states this has happened before where the blood sample was bad and when re-processed, potassium level came back normal. Last occurrence was at PCP office. She states they are willing to repeat the sample, but cannot come before Friday. She has an MRI tomorrow and her mother has appointments Thursday she has to take her to. Order placed for Friday to be drawn at [facility].

## 2022-10-06 ENCOUNTER — Encounter: Payer: Self-pay | Admitting: Cardiovascular Disease

## 2022-10-06 DIAGNOSIS — E875 Hyperkalemia: Secondary | ICD-10-CM

## 2022-10-10 DIAGNOSIS — E875 Hyperkalemia: Secondary | ICD-10-CM | POA: Diagnosis not present

## 2022-10-25 DIAGNOSIS — M1712 Unilateral primary osteoarthritis, left knee: Secondary | ICD-10-CM | POA: Diagnosis not present

## 2022-10-25 DIAGNOSIS — M7062 Trochanteric bursitis, left hip: Secondary | ICD-10-CM | POA: Diagnosis not present

## 2022-10-26 ENCOUNTER — Encounter: Payer: Self-pay | Admitting: Cardiovascular Disease

## 2022-11-03 ENCOUNTER — Other Ambulatory Visit: Payer: Self-pay | Admitting: Nurse Practitioner

## 2022-11-07 DIAGNOSIS — E119 Type 2 diabetes mellitus without complications: Secondary | ICD-10-CM | POA: Diagnosis not present

## 2022-11-10 NOTE — Patient Instructions (Signed)
SURGICAL WAITING ROOM VISITATION  Patients having surgery or a procedure may have no more than 2 support people in the waiting area - these visitors may rotate.    Children under the age of 25 must have an adult with them who is not the patient.  Due to an increase in RSV and influenza rates and associated hospitalizations, children ages 34 and under may not visit patients in [facility] hospitals.  If the patient needs to stay at the hospital during part of their recovery, the visitor guidelines for inpatient rooms apply. Pre-op nurse will coordinate an appropriate time for 1 support person to accompany patient in pre-op.  This support person may not rotate.    Please refer to the [facility] website for the visitor guidelines for Inpatients (after your surgery is over and you are in a regular room).    Your procedure is scheduled on: 11/23/22   Report to [facility] Main Entrance    Report to admitting at 7:30 AM   Call this number if you have problems the morning of surgery [phone]   Do not eat food :After Midnight.   After Midnight you may have the following liquids until 7:00 AM DAY OF SURGERY  Water Non-Citrus Juices (without pulp, NO RED-Apple, White grape, White cranberry) Black Coffee (NO MILK/CREAM OR CREAMERS, sugar ok)  Clear Tea (NO MILK/CREAM OR CREAMERS, sugar ok) regular and decaf                             Plain Jell-O (NO RED)                                           Fruit ices (not with fruit pulp, NO RED)                                     Popsicles (NO RED)                                                               Sports drinks like Gatorade (NO RED)                 The day of surgery:  Drink ONE (1) Pre-Surgery Clear G2 at 7:00 AM the morning of surgery. Drink in one sitting. Do not sip.  This drink was given to you during your hospital  pre-op appointment visit. Nothing else to drink after completing the  Pre-Surgery G2.          If  you have questions, please contact your surgeon's office.   FOLLOW BOWEL PREP AND ANY ADDITIONAL PRE OP INSTRUCTIONS YOU RECEIVED FROM YOUR SURGEON'S OFFICE!!!     Oral Hygiene is also important to reduce your risk of infection.                                    Remember - BRUSH YOUR TEETH THE MORNING OF SURGERY WITH YOUR REGULAR TOOTHPASTE  DENTURES WILL BE  REMOVED PRIOR TO SURGERY PLEASE DO NOT APPLY "Poly grip" OR ADHESIVES!!!   Stop all vitamins and herbal supplements 7 days before surgery.   Take these medicines the morning of surgery with A SIP OF WATER: Tylenol, Allopurinol, Alprazolam ,Amlodipine, Eye drops, Flonase, Claritin, Metoprolol, Omeprazole, Oxycodone, Venlafaxine                               You may not have any metal on your body including jewelry, and body piercing             Do not wear lotions, powders, cologne, or deodorant              Men may shave face and neck.   Do not bring valuables to the hospital. [facility] IS NOT             RESPONSIBLE   FOR VALUABLES.   Contacts, glasses, dentures or bridgework may not be worn into surgery.   Bring small overnight bag day of surgery.   DO NOT BRING YOUR HOME MEDICATIONS TO THE HOSPITAL. PHARMACY WILL DISPENSE MEDICATIONS LISTED ON YOUR MEDICATION LIST TO YOU DURING YOUR ADMISSION IN THE HOSPITAL!              Please read over the following fact sheets you were given: IF YOU HAVE QUESTIONS ABOUT YOUR PRE-OP INSTRUCTIONS PLEASE CALL [phone] [facility]    If you received a COVID test during your pre-op visit  it is requested that you wear a mask when out in public, stay away from anyone that may not be feeling well and notify your surgeon if you develop symptoms. If you test positive for Covid or have been in contact with anyone that has tested positive in the last 10 days please notify you surgeon.      Pre-operative 5 CHG Bath Instructions   You can play a key role in reducing the risk of infection  after surgery. Your skin needs to be as free of germs as possible. You can reduce the number of germs on your skin by washing with CHG (chlorhexidine gluconate) soap before surgery. CHG is an antiseptic soap that kills germs and continues to kill germs even after washing.   DO NOT use if you have an allergy to chlorhexidine/CHG or antibacterial soaps. If your skin becomes reddened or irritated, stop using the CHG and notify one of our RNs at [phone].   Please shower with the CHG soap starting 4 days before surgery using the following schedule:     Please keep in mind the following:  DO NOT shave, including legs and underarms, starting the day of your first shower.   You may shave your face at any point before/day of surgery.  Place clean sheets on your bed the day you start using CHG soap. Use a clean washcloth (not used since being washed) for each shower. DO NOT sleep with pets once you start using the CHG.   CHG Shower Instructions:  If you choose to wash your hair and private area, wash first with your normal shampoo/soap.  After you use shampoo/soap, rinse your hair and body thoroughly to remove shampoo/soap residue.  Turn the water OFF and apply about 3 tablespoons (45 ml) of CHG soap to a CLEAN washcloth.  Apply CHG soap ONLY FROM YOUR NECK DOWN TO YOUR TOES (washing for 3-5 minutes)  DO NOT use CHG soap on face, private areas, open  wounds, or sores.  Pay special attention to the area where your surgery is being performed.  If you are having back surgery, having someone wash your back for you may be helpful. Wait 2 minutes after CHG soap is applied, then you may rinse off the CHG soap.  Pat dry with a clean towel  Put on clean clothes/pajamas   If you choose to wear lotion, please use ONLY the CHG-compatible lotions on the back of this paper.     Additional instructions for the day of surgery: DO NOT APPLY any lotions, deodorants, cologne, or perfumes.   Put on  clean/comfortable clothes.  Brush your teeth.  Ask your nurse before applying any prescription medications to the skin.      CHG Compatible Lotions   Aveeno Moisturizing lotion  Cetaphil Moisturizing Cream  Cetaphil Moisturizing Lotion  Clairol Herbal Essence Moisturizing Lotion, Dry Skin  Clairol Herbal Essence Moisturizing Lotion, Extra Dry Skin  Clairol Herbal Essence Moisturizing Lotion, Normal Skin  Curel Age Defying Therapeutic Moisturizing Lotion with Alpha Hydroxy  Curel Extreme Care Body Lotion  Curel Soothing Hands Moisturizing Hand Lotion  Curel Therapeutic Moisturizing Cream, Fragrance-Free  Curel Therapeutic Moisturizing Lotion, Fragrance-Free  Curel Therapeutic Moisturizing Lotion, Original Formula  Eucerin Daily Replenishing Lotion  Eucerin Dry Skin Therapy Plus Alpha Hydroxy Crme  Eucerin Dry Skin Therapy Plus Alpha Hydroxy Lotion  Eucerin Original Crme  Eucerin Original Lotion  Eucerin Plus Crme Eucerin Plus Lotion  Eucerin TriLipid Replenishing Lotion  Keri Anti-Bacterial Hand Lotion  Keri Deep Conditioning Original Lotion Dry Skin Formula Softly Scented  Keri Deep Conditioning Original Lotion, Fragrance Free Sensitive Skin Formula  Keri Lotion Fast Absorbing Fragrance Free Sensitive Skin Formula  Keri Lotion Fast Absorbing Softly Scented Dry Skin Formula  Keri Original Lotion  Keri Skin Renewal Lotion Keri Silky Smooth Lotion  Keri Silky Smooth Sensitive Skin Lotion  Nivea Body Creamy Conditioning Oil  Nivea Body Extra Enriched Teacher, adult education Moisturizing Lotion Nivea Crme  Nivea Skin Firming Lotion  NutraDerm 30 Skin Lotion  NutraDerm Skin Lotion  NutraDerm Therapeutic Skin Cream  NutraDerm Therapeutic Skin Lotion  ProShield Protective Hand Cream  Provon moisturizing lotion  WHAT IS A BLOOD TRANSFUSION? Blood Transfusion Information  A transfusion is the replacement of blood or some of its parts. Blood is  made up of multiple cells which provide different functions. Red blood cells carry oxygen and are used for blood loss replacement. White blood cells fight against infection. Platelets control bleeding. Plasma helps clot blood. Other blood products are available for specialized needs, such as hemophilia or other clotting disorders. BEFORE THE TRANSFUSION  Who gives blood for transfusions?  Healthy volunteers who are fully evaluated to make sure their blood is safe. This is blood bank blood. Transfusion therapy is the safest it has ever been in the practice of medicine. Before blood is taken from a donor, a complete history is taken to make sure that person has no history of diseases nor engages in risky social behavior (examples are intravenous drug use or sexual activity with multiple partners). The donor's travel history is screened to minimize risk of transmitting infections, such as malaria. The donated blood is tested for signs of infectious diseases, such as HIV and hepatitis. The blood is then tested to be sure it is compatible with you in order to minimize the chance of a transfusion reaction. If you or a relative donates blood, this is often done in  anticipation of surgery and is not appropriate for emergency situations. It takes many days to process the donated blood. RISKS AND COMPLICATIONS Although transfusion therapy is very safe and saves many lives, the main dangers of transfusion include:  Getting an infectious disease. Developing a transfusion reaction. This is an allergic reaction to something in the blood you were given. Every precaution is taken to prevent this. The decision to have a blood transfusion has been considered carefully by your caregiver before blood is given. Blood is not given unless the benefits outweigh the risks. AFTER THE TRANSFUSION Right after receiving a blood transfusion, you will usually feel much better and more energetic. This is especially true if your red  blood cells have gotten low (anemic). The transfusion raises the level of the red blood cells which carry oxygen, and this usually causes an energy increase. The nurse administering the transfusion will monitor you carefully for complications. HOME CARE INSTRUCTIONS  No special instructions are needed after a transfusion. You may find your energy is better. Speak with your caregiver about any limitations on activity for underlying diseases you may have. SEEK MEDICAL CARE IF:  Your condition is not improving after your transfusion. You develop redness or irritation at the intravenous (IV) site. SEEK IMMEDIATE MEDICAL CARE IF:  Any of the following symptoms occur over the next 12 hours: Shaking chills. You have a temperature by mouth above 102 F (38.9 C), not controlled by medicine. Chest, back, or muscle pain. People around you feel you are not acting correctly or are confused. Shortness of breath or difficulty breathing. Dizziness and fainting. You get a rash or develop hives. You have a decrease in urine output. Your urine turns a dark color or changes to pink, red, or brown. Any of the following symptoms occur over the next 10 days: You have a temperature by mouth above 102 F (38.9 C), not controlled by medicine. Shortness of breath. Weakness after normal activity. The white part of the eye turns yellow (jaundice). You have a decrease in the amount of urine or are urinating less often. Your urine turns a dark color or changes to pink, red, or brown. Document Released: 02/11/2000 Document Revised: 05/08/2011 Document Reviewed: 09/30/2007 ExitCare Patient Information 2014 [place], [place].  _______________________________________________________________________  Incentive Spirometer  An incentive spirometer is a tool that can help keep your lungs clear and active. This tool measures how well you are filling your lungs with each breath. Taking long deep breaths may help reverse or  decrease the chance of developing breathing (pulmonary) problems (especially infection) following: A long period of time when you are unable to move or be active. BEFORE THE PROCEDURE  If the spirometer includes an indicator to show your best effort, your nurse or respiratory therapist will set it to a desired goal. If possible, sit up straight or lean slightly forward. Try not to slouch. Hold the incentive spirometer in an upright position. INSTRUCTIONS FOR USE  Sit on the edge of your bed if possible, or sit up as far as you can in bed or on a chair. Hold the incentive spirometer in an upright position. Breathe out normally. Place the mouthpiece in your mouth and seal your lips tightly around it. Breathe in slowly and as deeply as possible, raising the piston or the ball toward the top of the column. Hold your breath for 3-5 seconds or for as long as possible. Allow the piston or ball to fall to the bottom of the column. Remove the  mouthpiece from your mouth and breathe out normally. Rest for a few seconds and repeat Steps 1 through 7 at least 10 times every 1-2 hours when you are awake. Take your time and take a few normal breaths between deep breaths. The spirometer may include an indicator to show your best effort. Use the indicator as a goal to work toward during each repetition. After each set of 10 deep breaths, practice coughing to be sure your lungs are clear. If you have an incision (the cut made at the time of surgery), support your incision when coughing by placing a pillow or rolled up towels firmly against it. Once you are able to get out of bed, walk around indoors and cough well. You may stop using the incentive spirometer when instructed by your caregiver.  RISKS AND COMPLICATIONS Take your time so you do not get dizzy or light-headed. If you are in pain, you may need to take or ask for pain medication before doing incentive spirometry. It is harder to take a deep breath if you  are having pain. AFTER USE Rest and breathe slowly and easily. It can be helpful to keep track of a log of your progress. Your caregiver can provide you with a simple table to help with this. If you are using the spirometer at home, follow these instructions: SEEK MEDICAL CARE IF:  You are having difficultly using the spirometer. You have trouble using the spirometer as often as instructed. Your pain medication is not giving enough relief while using the spirometer. You develop fever of 100.5 F (38.1 C) or higher. SEEK IMMEDIATE MEDICAL CARE IF:  You cough up bloody sputum that had not been present before. You develop fever of 102 F (38.9 C) or greater. You develop worsening pain at or near the incision site. MAKE SURE YOU:  Understand these instructions. Will watch your condition. Will get help right away if you are not doing well or get worse. Document Released: 06/26/2006 Document Revised: 05/08/2011 Document Reviewed: 08/27/2006 ExitCare Patient Information 2014 [place].   ________________________________________________________________________  [facility]- Preparing for Total Shoulder Arthroplasty    Before surgery, you can play an important role. Because skin is not sterile, your skin needs to be as free of germs as possible. You can reduce the number of germs on your skin by using the following products. Benzoyl Peroxide Gel Reduces the number of germs present on the skin Applied twice a day to shoulder area starting two days before surgery    ==================================================================  Please follow these instructions carefully:  BENZOYL PEROXIDE 5% GEL  Please do not use if you have an allergy to benzoyl peroxide.   If your skin becomes reddened/irritated stop using the benzoyl peroxide.  Starting two days before surgery, apply as follows: Apply benzoyl peroxide in the morning and at night. Apply after taking a shower. If you are  not taking a shower clean entire shoulder front, back, and side along with the armpit with a clean wet washcloth.  Place a quarter-sized dollop on your shoulder and rub in thoroughly, making sure to cover the front, back, and side of your shoulder, along with the armpit.   2 days before ____ AM   ____ PM              1 day before ____ AM   ____ PM  Do this twice a day for two days.  (Last application is the night before surgery, AFTER using the CHG soap as described below).  Do NOT apply benzoyl peroxide gel on the day of surgery.

## 2022-11-10 NOTE — Progress Notes (Signed)
COVID Vaccine Completed: yes  Date of COVID positive in last 90 days:  PCP - Tally Joe, MD Cardiologist - Laqueta Carina, MD LOV 10/02/22  Cardiac clearance by Laqueta Carina 10/02/22 in Epic  Chest x-ray - 06/14/22 Epic EKG - 05/21/22 Epic Stress Test -  ECHO - 05/19/22 Epic Cardiac Cath - 02/03/22 Epic Pacemaker/ICD device last checked: Spinal Cord Stimulator:  Bowel Prep -   Sleep Study -  CPAP -   Fasting Blood Sugar - preDM Checks Blood Sugar _____ times a day  Last dose of GLP1 agonist-  N/A GLP1 instructions:  N/A   Last dose of SGLT-2 inhibitors-  N/A SGLT-2 instructions: N/A   Blood Thinner Instructions:  Time Aspirin Instructions: ASA 325, hold 5-7 days Last Dose:  Activity level:  Can go up a flight of stairs and perform activities of daily living without stopping and without symptoms of chest pain or shortness of breath.  Able to exercise without symptoms  Unable to go up a flight of stairs without symptoms of     Anesthesia review: CAD, HTN, OSA, CKD, a fib, CABG  Patient denies shortness of breath, fever, cough and chest pain at PAT appointment  Patient verbalized understanding of instructions that were given to them at the PAT appointment. Patient was also instructed that they will need to review over the PAT instructions again at home before surgery.

## 2022-11-13 ENCOUNTER — Encounter (HOSPITAL_COMMUNITY): Payer: Self-pay

## 2022-11-13 ENCOUNTER — Encounter (HOSPITAL_COMMUNITY)
Admission: RE | Admit: 2022-11-13 | Discharge: 2022-11-13 | Disposition: A | Discharge status: Home / Self Care | Payer: Medicare Other | Source: Ambulatory Visit | Attending: Orthopedic Surgery | Admitting: Orthopedic Surgery

## 2022-11-13 ENCOUNTER — Other Ambulatory Visit: Payer: Self-pay

## 2022-11-13 VITALS — BP 129/94 | HR 69 | Temp 97.6°F | Resp 14 | Ht 68.0 in | Wt 222.0 lb

## 2022-11-13 DIAGNOSIS — Z01812 Encounter for preprocedural laboratory examination: Secondary | ICD-10-CM | POA: Diagnosis not present

## 2022-11-13 DIAGNOSIS — Z01818 Encounter for other preprocedural examination: Secondary | ICD-10-CM | POA: Diagnosis present

## 2022-11-13 DIAGNOSIS — I10 Essential (primary) hypertension: Secondary | ICD-10-CM | POA: Diagnosis not present

## 2022-11-13 LAB — SURGICAL PCR SCREEN
MRSA, PCR: NEGATIVE
Staphylococcus aureus: NEGATIVE

## 2022-11-13 LAB — BASIC METABOLIC PANEL
Anion gap: 8 (ref 5–15)
BUN: 16 mg/dL (ref 8–23)
CO2: 25 mmol/L (ref 22–32)
Calcium: 9 mg/dL (ref 8.9–10.3)
Chloride: 103 mmol/L (ref 98–111)
Creatinine, Ser: 0.86 mg/dL (ref 0.61–1.24)
GFR, Estimated: >60 mL/min (ref >60)
Glucose, Bld: 119 mg/dL — ABNORMAL HIGH (ref 70–99)
Potassium: 4.8 mmol/L (ref 3.5–5.1)
Sodium: 136 mmol/L (ref 135–145)

## 2022-11-13 LAB — CBC
HCT: 40.3 % (ref 39.0–52.0)
Hemoglobin: 12.4 g/dL — ABNORMAL LOW (ref 13.0–17.0)
MCH: 26 pg (ref 26.0–34.0)
MCHC: 30.8 g/dL (ref 30.0–36.0)
MCV: 84.5 fL (ref 80.0–100.0)
Platelets: 251 10*3/uL (ref 150–400)
RBC: 4.77 MIL/uL (ref 4.22–5.81)
RDW: 18.5 % — ABNORMAL HIGH (ref 11.5–15.5)
WBC: 7.1 10*3/uL (ref 4.0–10.5)
nRBC: 0 % (ref 0.0–0.2)

## 2022-11-14 ENCOUNTER — Encounter: Payer: Self-pay | Admitting: Cardiovascular Disease

## 2022-11-15 ENCOUNTER — Encounter (HOSPITAL_COMMUNITY): Payer: Self-pay

## 2022-11-15 NOTE — Telephone Encounter (Signed)
Called and spoke with wife Eunice Blase per DPR informed her that Hgb was 12.7 and no concern of anemia at this level prior to shoulder surgery, but if needed, they ordered a type and screen so should something arise in surgery, they are prepared to transfuse. No further questions.

## 2022-11-15 NOTE — Progress Notes (Addendum)
Anesthesia Review:  DISCUSSION: Victor [name] is a 67 yo male who presents to PAT prior to "Removal Left anatomic shoulder arthroplasty and conversion to Reverse shoulder arthroplasty" on 11/23/22 with Dr. Rennis Chris. PMH of former smoking, HTN, CAD s/p recent CABG (05/19/22), PVD, DM, GERD, fibromyalgia, OSA (no CPAP), anxiety, arthritis (s/p bilateral shoulder replacements, right TKA), chronic neck and back pain s/p multiple back surgeries and lumbar fusions (1997, 2019, 2023).  Patient started seeing Cardiology in Oct 2023 due to atypical chest pain. Had a cardiac PET scan that was c/f ischemia. Underwent cath and CABG was recommended. Had a CABG on 05/19/22 with Dr. Laneta Simmers. The patient's early postoperative recovery while in the hospital was notable for development of postoperative atrial fibrillation converted with amiodarone. Was not put on a blood thinner. He was discharged from CT surgery care. He last saw Cardiology in clinic on 10/02/22. Reported ongoing chest soreness but no symptoms c/f angina. Per Dr. Elease Hashimoto:   "He needs to have shoulder surgery  He may hold his ASA for 5-7 days  He is at low risk for his shoulder surgery"  Follows with PCP at [facility]. Last seen on 11/11/22. Medical clearance in chart dated 09/14/22. Pt wit h hx of HTN which is controlled. Also DM but is not on meds. Last A1c was 5.9 on 11/11/22  Patient called his Cardiologist's office due to mildly low Hgb (12.4 on PAT labs). Discussed with wife that this should not affect surgery but to f/u on this with PCP. There are no active signs of bleeding. He has stopped the ASA 325mg  per Cardiology instructions.   VS: BP (!) 129/94   Pulse 69   Temp 36.4 C (Oral)   Resp 14   Ht 5\' 8"  (1.727 m)   Wt 100.7 kg   SpO2 100%   BMI 33.75 kg/m   PROVIDERS: Tally Joe, MD Cardiology: Nahser CT Surgery: [place]  LABS: Labs reviewed: Acceptable for surgery. (all labs ordered are listed, but only abnormal results are  displayed)  Labs Reviewed  BASIC METABOLIC PANEL - Abnormal; Notable for the following components:      Result Value   Glucose, Bld 119 (*)    All other components within normal limits  CBC - Abnormal; Notable for the following components:   Hemoglobin 12.4 (*)    RDW 18.5 (*)    All other components within normal limits  SURGICAL PCR SCREEN  TYPE AND SCREEN     IMAGES:   EKG:   CV:  Echo 05/19/22:  PRE-OP FINDINGS   Left Ventricle: The left ventricle has normal systolic function, with an  ejection fraction of 60-65%, measured 62%. The cavity size was normal. No  evidence of left ventricular regional wall motion abnormalities. There is  no left ventricular hypertrophy.   Left ventricular diastolic function was not evaluated.   Right Ventricle: The right ventricle has normal systolic function. The  cavity was normal. There is no increase in right ventricular wall  thickness. Catheter present in the right ventricle.   Left Atrium: Left atrial size was normal in size. No left atrial/left  atrial appendage thrombus was detected. Left atrial appendage velocity is  normal at greater than 40 cm/s.   Right Atrium: Right atrial size was normal in size. Prominent Eustachian  valve. Catheter present in the right atrium.   Interatrial Septum: No atrial level shunt detected by color flow Doppler.   Pericardium: There is no evidence of pericardial effusion.   Mitral Valve: The  mitral valve is normal in structure. Mitral valve  regurgitation is trivial by color flow Doppler. There is no evidence of  mitral valve vegetation. There is no evidence of mitral stenosis, with  peak gradient 4 mmHg, mean gradient 1 mmHg.   Tricuspid Valve: The tricuspid valve was normal in structure. Tricuspid  valve regurgitation was not visualized by color flow Doppler. No evidence  of tricuspid stenosis is present. There is no evidence of tricuspid valve  vegetation.   Aortic Valve: The aortic  valve is tricuspid. Aortic valve regurgitation is  trivial by color flow Doppler. There is no stenosis of the aortic valve,  with peak gradient 5 mmHg, mean gradient 3 mmHg. There is no evidence of  aortic valve vegetation.   Pulmonic Valve: The pulmonic valve was normal in structure, with normal  leaflet mobility. No evidence of pulmonic stenosis. Pulmonic valve  regurgitation is trivial by color flow Doppler, around the PA catheter.    Aorta: The aortic root, ascending aorta and aortic arch are normal in size  and structure. There is evidence of plaque in the descending aorta; Grade  II, measuring 2-14mm in size.   Pulmonary Artery: Theone Murdoch catheter present on the left. The pulmonary  artery is of normal size.   Venous: The inferior vena cava is normal in size with greater than 50%  respiratory variability, suggesting right atrial pressure of 3 mmHg.   Shunts: There is no evidence of an atrial septal defect.   Past Medical History:  Diagnosis Date   Allergic rhinitis    Anxiety    Arteriosclerosis of thoracic aorta (HCC)    Arthritis    "all over" (04/15/2013)   Back pain, chronic    BMI 32.0-32.9,adult    Cervicalgia    Chronic neck pain    Coronary artery disease 2023   COVID-19    DDD (degenerative disc disease), cervical    DDD (degenerative disc disease), lumbar    Diverticulosis    Elevated hemoglobin (HCC)    Fibromyalgia    GERD (gastroesophageal reflux disease)    Gout    Headache    High cholesterol    History of bronchitis    Hyperkalemia    Hypertension    Insomnia    Kidney disease with benign hypertension    Leg cramps    Right greater than left   Low back pain    Low testosterone    Melanoma (HCC)    Nose   MRSA infection    Neuropathy of both feet    Peripheral vascular disease (HCC)    Personal history of colonic polyps    Polycythemia    Pre-diabetes    Radiculopathy of lumbar region    Raynaud phenomenon    Recurrent sinus infections     Right sided sciatica    Sleep apnea    "don't use CPAP" (04/15/2013)   Tobacco dependence     Past Surgical History:  Procedure Laterality Date   ANTERIOR CERVICAL DECOMP/DISCECTOMY FUSION  2000's X3   "had 3 surgeries on my front neck" (04/15/2013)   ANTERIOR LAT LUMBAR FUSION Left 08/06/2017   Procedure: LEFT LUMBAR TWO-THREE ANTERIOR LATERAL LUMBAR FUSION WITH PERCUTANEOUS SCREWS;  Surgeon: Julio Sicks, MD;  Location: [facility] OR;  Service: Neurosurgery;  Laterality: Left;   APPENDECTOMY  ~ 2010   BACK SURGERY  05/31/2021   Lumbar fusion L4-L5 L5-S1 with Dr. Laurell Roof TUNNEL RELEASE Left 1990's   CATARACT EXTRACTION, BILATERAL  COLONOSCOPY     CORONARY ARTERY BYPASS GRAFT N/A 05/19/2022   Procedure: CORONARY ARTERY BYPASS GRAFTING (CABG) X FIVE BYPASSES USING OPEN LEFT INTERNAL MAMMARY ARTERY AND ENDOSCOPIC LEFT GREATER SAPHENOUS VEIN HARVEST;  Surgeon: Alleen Borne, MD;  Location: [facility] OR;  Service: Open Heart Surgery;  Laterality: N/A;   CORONARY PRESSURE/FFR STUDY N/A 02/03/2022   Procedure: INTRAVASCULAR PRESSURE WIRE/FFR STUDY;  Surgeon: Kathleene Hazel, MD;  Location: [facility] INVAS[facility];  Service: Cardiovascular;  Laterality: N/A;   ear drum surgery  1974   EYE SURGERY Right    FLEXIBLE SIGMOIDOSCOPY  12/21/2011   Procedure: FLEXIBLE SIGMOIDOSCOPY;  Surgeon: Shirley Friar, MD;  Location: [facility] ENDOS[facility];  Service: Endoscopy;  Laterality: N/A;   HAND TENDON SURGERY Left ~ 1978 X2-1980's X3   "had 5 ORs after laceration"   HOT HEMOSTASIS  12/21/2011   Procedure: HOT HEMOSTASIS (ARGON PLASMA COAGULATION/BICAP);  Surgeon: Shirley Friar, MD;  Location: [facility];  Service: Endoscopy;  Laterality: N/A;   INGUINAL HERNIA REPAIR Left 04/15/2013   Procedure: LAPAROSCOPIC REPAIR OF INCARCERATED LEFT INGUINAL HERNIA;  Surgeon: Atilano Ina, MD;  Location: [facility] OR;  Service: General;  Laterality: Left;   INGUINAL HERNIA REPAIR Bilateral 1970's - 1984   INGUINAL  HERNIA REPAIR Left 04/15/2013   INSERTION OF MESH Left 04/15/2013   Procedure: INSERTION OF MESH;  Surgeon: Atilano Ina, MD;  Location: [facility];  Service: General;  Laterality: Left;   JOINT REPLACEMENT     right knee   KNEE ARTHROSCOPY Right 1990's   LAPAROSCOPY N/A 04/15/2013   Procedure: LAPAROSCOPY DIAGNOSTIC;  Surgeon: Atilano Ina, MD;  Location: [facility] OR;  Service: General;  Laterality: N/A;   LEFT HEART CATH AND CORONARY ANGIOGRAPHY N/A 02/03/2022   Procedure: LEFT HEART CATH AND CORONARY ANGIOGRAPHY;  Surgeon: Kathleene Hazel, MD;  Location: [facility];  Service: Cardiovascular;  Laterality: N/A;   LUMBAR LAMINECTOMY/DECOMPRESSION MICRODISCECTOMY Right 07/22/2015   Procedure: Right Lumbar Two-Three Microdiscectomy;  Surgeon: Julio Sicks, MD;  Location: [facility] NEURO ORS;  Service: Neurosurgery;  Laterality: Right;   LUMBAR LAMINECTOMY/DECOMPRESSION MICRODISCECTOMY Right 09/05/2016   Procedure: Microdiscectomy - right - T12-L1;  Surgeon: Julio Sicks, MD;  Location: [facility] OR;  Service: Neurosurgery;  Laterality: Right;   NASAL SEPTUM SURGERY Bilateral 2014   "removed polyps & infection"   POLYPECTOMY  01/10/2011   Procedure: POLYPECTOMY;  Surgeon: Shirley Friar, MD;  Location: [facility];  Service: Endoscopy;  Laterality: N/A;   POSTERIOR CERVICAL LAMINECTOMY Left 05/24/2020   Procedure: Laminectomy and Foraminotomy - left - Cervical two-Cervical three;  Surgeon: Julio Sicks, MD;  Location: [facility] OR;  Service: Neurosurgery;  Laterality: Left;   POSTERIOR LUMBAR FUSION  11/1995   "ray cages" (04/15/2013)   SHOULDER ARTHROSCOPY Bilateral    "twice each"   TEE WITHOUT CARDIOVERSION N/A 05/19/2022   Procedure: TRANSESOPHAGEAL ECHOCARDIOGRAM;  Surgeon: Alleen Borne, MD;  Location: [facility] OR;  Service: Open Heart Surgery;  Laterality: N/A;   TONSILLECTOMY  1960's   TOTAL KNEE ARTHROPLASTY Right 02/24/2014   Procedure: TOTAL RIGHT KNEE ARTHROPLASTY;  Surgeon: Shelda Pal, MD;   Location: [facility] ORS;  Service: Orthopedics;  Laterality: Right;   TOTAL SHOULDER ARTHROPLASTY Left 09/18/2019   Procedure: TOTAL SHOULDER ARTHROPLASTY;  Surgeon: Francena Hanly, MD;  Location: [facility] ORS;  Service: Orthopedics;  Laterality: Left;    TOTAL SHOULDER ARTHROPLASTY Right 11/04/2020   Procedure: TOTAL SHOULDER ARTHROPLASTY;  Surgeon: Francena Hanly, MD;  Location:  [facility] ORS;  Service: Orthopedics;  Laterality: Right;    UPPER GI ENDOSCOPY     VASECTOMY  1988   WISDOM TOOTH EXTRACTION  2000's    MEDICATIONS:  acetaminophen (TYLENOL) 500 MG tablet   allopurinol (ZYLOPRIM) 300 MG tablet   ALPRAZolam (XANAX) 1 MG tablet   amLODipine (NORVASC) 5 MG tablet   aspirin EC 325 MG tablet   b complex vitamins tablet   Bacitracin-Polymyxin B (NEOSPORIN EX)   Biotin 5000 MCG CAPS   Calcium Carbonate (CALCIUM 600 PO)   carboxymethylcellulose (REFRESH PLUS) 0.5 % SOLN   colchicine 0.6 MG tablet   cyclobenzaprine (FLEXERIL) 10 MG tablet   Evolocumab (REPATHA SURECLICK) 140 MG/ML SOAJ   fish oil-omega-3 fatty acids 1000 MG capsule   fluticasone (FLONASE) 50 MCG/ACT nasal spray   GINSENG PO   guaiFENesin (MUCINEX) 600 MG 12 hr tablet   hydrocortisone cream 1 %   ibuprofen (ADVIL) 200 MG tablet   loratadine (CLARITIN) 10 MG tablet   magnesium gluconate (MAGONATE) 500 MG tablet   Melatonin 10 MG TABS   Methylsulfonylmethane (MSM) 1000 MG CAPS   metoprolol tartrate (LOPRESSOR) 25 MG tablet   Multiple Vitamins-Minerals (MULTIVITAMIN WITH MINERALS) tablet   nitroGLYCERIN (NITROSTAT) 0.4 MG SL tablet   NONFORMULARY OR COMPOUNDED ITEM   omeprazole (PRILOSEC) 40 MG capsule   Oxycodone HCl 10 MG TABS   pravastatin (PRAVACHOL) 40 MG tablet   Psyllium (METAMUCIL FIBER PO)   selenium 200 MCG TABS tablet   sildenafil (VIAGRA) 100 MG tablet   silodosin (RAPAFLO) 8 MG CAPS capsule   sodium chloride (OCEAN) 0.65 % SOLN nasal spray   testosterone cypionate (DEPOTESTOTERONE CYPIONATE) 200 MG/ML  injection   Turmeric 500 MG CAPS   venlafaxine (EFFEXOR-XR) 150 MG 24 hr capsule   Vitamin D3 (VITAMIN D) 25 MCG tablet   vitamin E 400 UNIT capsule   No current facility-administered medications for this encounter.   Marcille Blanco [facility] Surgical Short Stay/Anesthesiology [facility] Phone [phone] 11/15/2022 8:07 AM

## 2022-11-23 LAB — TYPE AND SCREEN
ABO/RH(D): A POS
Antibody Screen: NEGATIVE

## 2022-11-27 NOTE — Progress Notes (Signed)
Pt's wife Stanton Kidney Shuck) was update about the new date and time for surgery. Surgery is schedule for 11/30/22,pt. Should arrive to admitting at: 8:00 AM. Pt. Needs to drink G2 at: 7:30 AM. Mrs. Lowrimore verbalized her understanding of the new instructions,she also said her husband is not having any cough or fever. Last week she said,he was not feeling well: lethargic,running nose and headache.As per Mrs. Dedic all those symptoms are not present any more, and pt. Feels fine.

## 2022-11-30 ENCOUNTER — Inpatient Hospital Stay (HOSPITAL_COMMUNITY)
Admission: RE | Admit: 2022-11-30 | Discharge: 2022-11-30 | DRG: 483 | Disposition: A | Discharge status: Home / Self Care | Payer: Medicare Other | Source: Ambulatory Visit | Attending: Orthopedic Surgery | Admitting: Orthopedic Surgery

## 2022-11-30 ENCOUNTER — Other Ambulatory Visit: Payer: Self-pay

## 2022-11-30 ENCOUNTER — Encounter (HOSPITAL_COMMUNITY): Payer: Self-pay

## 2022-11-30 ENCOUNTER — Other Ambulatory Visit (HOSPITAL_COMMUNITY): Payer: Self-pay

## 2022-11-30 ENCOUNTER — Inpatient Hospital Stay (HOSPITAL_COMMUNITY): Payer: Medicare Other | Admitting: Anesthesiology

## 2022-11-30 ENCOUNTER — Encounter (HOSPITAL_COMMUNITY): Payer: Self-pay | Admitting: Orthopedic Surgery

## 2022-11-30 ENCOUNTER — Encounter (HOSPITAL_COMMUNITY)
Admission: RE | Disposition: A | Discharge status: Home / Self Care | Payer: Self-pay | Source: Ambulatory Visit | Attending: Orthopedic Surgery

## 2022-11-30 DIAGNOSIS — Z981 Arthrodesis status: Secondary | ICD-10-CM | POA: Diagnosis not present

## 2022-11-30 DIAGNOSIS — Z8249 Family history of ischemic heart disease and other diseases of the circulatory system: Secondary | ICD-10-CM

## 2022-11-30 DIAGNOSIS — M797 Fibromyalgia: Secondary | ICD-10-CM | POA: Diagnosis not present

## 2022-11-30 DIAGNOSIS — Z880 Allergy status to penicillin: Secondary | ICD-10-CM | POA: Diagnosis not present

## 2022-11-30 DIAGNOSIS — Y929 Unspecified place or not applicable: Secondary | ICD-10-CM

## 2022-11-30 DIAGNOSIS — R531 Weakness: Secondary | ICD-10-CM | POA: Diagnosis not present

## 2022-11-30 DIAGNOSIS — Z9841 Cataract extraction status, right eye: Secondary | ICD-10-CM

## 2022-11-30 DIAGNOSIS — I700 Atherosclerosis of aorta: Secondary | ICD-10-CM | POA: Diagnosis present

## 2022-11-30 DIAGNOSIS — Z85820 Personal history of malignant melanoma of skin: Secondary | ICD-10-CM

## 2022-11-30 DIAGNOSIS — F419 Anxiety disorder, unspecified: Secondary | ICD-10-CM | POA: Diagnosis present

## 2022-11-30 DIAGNOSIS — T84098A Other mechanical complication of other internal joint prosthesis, initial encounter: Secondary | ICD-10-CM

## 2022-11-30 DIAGNOSIS — I10 Essential (primary) hypertension: Secondary | ICD-10-CM | POA: Diagnosis not present

## 2022-11-30 DIAGNOSIS — Z96612 Presence of left artificial shoulder joint: Principal | ICD-10-CM

## 2022-11-30 DIAGNOSIS — Z96611 Presence of right artificial shoulder joint: Secondary | ICD-10-CM | POA: Diagnosis not present

## 2022-11-30 DIAGNOSIS — K219 Gastro-esophageal reflux disease without esophagitis: Secondary | ICD-10-CM | POA: Diagnosis not present

## 2022-11-30 DIAGNOSIS — Z96651 Presence of right artificial knee joint: Secondary | ICD-10-CM | POA: Diagnosis present

## 2022-11-30 DIAGNOSIS — Z9842 Cataract extraction status, left eye: Secondary | ICD-10-CM | POA: Diagnosis not present

## 2022-11-30 DIAGNOSIS — Z888 Allergy status to other drugs, medicaments and biological substances status: Secondary | ICD-10-CM

## 2022-11-30 DIAGNOSIS — Y792 Prosthetic and other implants, materials and accessory orthopedic devices associated with adverse incidents: Secondary | ICD-10-CM | POA: Diagnosis present

## 2022-11-30 DIAGNOSIS — G4700 Insomnia, unspecified: Secondary | ICD-10-CM | POA: Diagnosis present

## 2022-11-30 DIAGNOSIS — M25512 Pain in left shoulder: Secondary | ICD-10-CM | POA: Diagnosis not present

## 2022-11-30 DIAGNOSIS — I2510 Atherosclerotic heart disease of native coronary artery without angina pectoris: Secondary | ICD-10-CM | POA: Diagnosis not present

## 2022-11-30 DIAGNOSIS — Z8616 Personal history of COVID-19: Secondary | ICD-10-CM | POA: Diagnosis not present

## 2022-11-30 DIAGNOSIS — Z720 Tobacco use: Secondary | ICD-10-CM

## 2022-11-30 DIAGNOSIS — G8918 Other acute postprocedural pain: Secondary | ICD-10-CM | POA: Diagnosis not present

## 2022-11-30 DIAGNOSIS — Z87891 Personal history of nicotine dependence: Secondary | ICD-10-CM | POA: Diagnosis not present

## 2022-11-30 DIAGNOSIS — Z820 Family history of epilepsy and other diseases of the nervous system: Secondary | ICD-10-CM

## 2022-11-30 DIAGNOSIS — T8489XA Other specified complication of internal orthopedic prosthetic devices, implants and grafts, initial encounter: Secondary | ICD-10-CM | POA: Diagnosis not present

## 2022-11-30 DIAGNOSIS — I25119 Atherosclerotic heart disease of native coronary artery with unspecified angina pectoris: Secondary | ICD-10-CM | POA: Diagnosis not present

## 2022-11-30 DIAGNOSIS — E7800 Pure hypercholesterolemia, unspecified: Secondary | ICD-10-CM | POA: Diagnosis not present

## 2022-11-30 HISTORY — PX: TOTAL SHOULDER REVISION: SHX6130

## 2022-11-30 SURGERY — REVISION, TOTAL ARTHROPLASTY, SHOULDER
Anesthesia: General | Site: Shoulder | Laterality: Left

## 2022-11-30 MED ORDER — ONDANSETRON HCL 4 MG/2ML IJ SOLN
INTRAMUSCULAR | Status: DC | PRN
  Administered 2022-11-30: 4 mg via INTRAVENOUS

## 2022-11-30 MED ORDER — LACTATED RINGERS IV SOLN: INTRAVENOUS | Status: DC

## 2022-11-30 MED ORDER — VASOPRESSIN 20 UNIT/ML IV SOLN
INTRAVENOUS | Status: AC
  Filled 2022-11-30: qty 1

## 2022-11-30 MED ORDER — FENTANYL CITRATE PF 50 MCG/ML IJ SOSY
100.0000 ug | PREFILLED_SYRINGE | Freq: Once | INTRAMUSCULAR | Status: AC
  Administered 2022-11-30: 50 ug via INTRAVENOUS
  Filled 2022-11-30: qty 2

## 2022-11-30 MED ORDER — MIDAZOLAM HCL 2 MG/2ML IJ SOLN
2.0000 mg | Freq: Once | INTRAMUSCULAR | Status: AC
  Administered 2022-11-30: 1 mg via INTRAVENOUS
  Filled 2022-11-30: qty 2

## 2022-11-30 MED ORDER — ONDANSETRON HCL 4 MG PO TABS
4.0000 mg | ORAL_TABLET | Freq: Four times a day (QID) | ORAL | Status: DC | PRN
  Filled 2022-11-30: qty 1

## 2022-11-30 MED ORDER — TRANEXAMIC ACID-NACL 1000-0.7 MG/100ML-% IV SOLN
1000.0000 mg | INTRAVENOUS | Status: AC
  Administered 2022-11-30: 1000 mg via INTRAVENOUS
  Filled 2022-11-30: qty 100

## 2022-11-30 MED ORDER — DEXAMETHASONE SODIUM PHOSPHATE 10 MG/ML IJ SOLN
INTRAMUSCULAR | Status: AC
  Filled 2022-11-30: qty 1

## 2022-11-30 MED ORDER — FENTANYL CITRATE (PF) 100 MCG/2ML IJ SOLN
INTRAMUSCULAR | Status: DC | PRN
  Administered 2022-11-30: 50 ug via INTRAVENOUS

## 2022-11-30 MED ORDER — PHENYLEPHRINE HCL (PRESSORS) 10 MG/ML IV SOLN
INTRAVENOUS | Status: DC | PRN
  Administered 2022-11-30: 80 ug via INTRAVENOUS
  Administered 2022-11-30: 160 ug via INTRAVENOUS

## 2022-11-30 MED ORDER — SODIUM CHLORIDE (PF) 0.9 % IJ SOLN
INTRAMUSCULAR | Status: AC
  Filled 2022-11-30: qty 10

## 2022-11-30 MED ORDER — PHENYLEPHRINE HCL-NACL 20-0.9 MG/250ML-% IV SOLN
INTRAVENOUS | Status: DC | PRN
Start: 2022-11-30 — End: 2022-11-30
  Administered 2022-11-30: 50 ug/min via INTRAVENOUS

## 2022-11-30 MED ORDER — MIDAZOLAM HCL 5 MG/5ML IJ SOLN
INTRAMUSCULAR | Status: DC | PRN
  Administered 2022-11-30 (×2): 1 mg via INTRAVENOUS

## 2022-11-30 MED ORDER — EPHEDRINE SULFATE (PRESSORS) 50 MG/ML IJ SOLN
INTRAMUSCULAR | Status: DC | PRN
Start: 2022-11-30 — End: 2022-11-30
  Administered 2022-11-30 (×3): 5 mg via INTRAVENOUS

## 2022-11-30 MED ORDER — CEFAZOLIN SODIUM-DEXTROSE 2-4 GM/100ML-% IV SOLN
2.0000 g | INTRAVENOUS | Status: AC
  Administered 2022-11-30: 2 g via INTRAVENOUS
  Filled 2022-11-30: qty 100

## 2022-11-30 MED ORDER — EPHEDRINE 5 MG/ML INJ
INTRAVENOUS | Status: AC
  Filled 2022-11-30: qty 5

## 2022-11-30 MED ORDER — BUPIVACAINE-EPINEPHRINE (PF) 0.5% -1:200000 IJ SOLN
INTRAMUSCULAR | Status: DC | PRN
Start: 2022-11-30 — End: 2022-11-30
  Administered 2022-11-30: 15 mL via PERINEURAL

## 2022-11-30 MED ORDER — ROCURONIUM BROMIDE 10 MG/ML (PF) SYRINGE
PREFILLED_SYRINGE | INTRAVENOUS | Status: AC
  Filled 2022-11-30: qty 10

## 2022-11-30 MED ORDER — PROPOFOL 10 MG/ML IV BOLUS
INTRAVENOUS | Status: AC
  Filled 2022-11-30: qty 20

## 2022-11-30 MED ORDER — LIDOCAINE HCL (PF) 2 % IJ SOLN
INTRAMUSCULAR | Status: AC
  Filled 2022-11-30: qty 5

## 2022-11-30 MED ORDER — ORAL CARE MOUTH RINSE: 15.0000 mL | Freq: Once | OROMUCOSAL | Status: AC

## 2022-11-30 MED ORDER — PHENYLEPHRINE HCL (PRESSORS) 10 MG/ML IV SOLN
INTRAVENOUS | Status: AC
  Filled 2022-11-30: qty 1

## 2022-11-30 MED ORDER — CYCLOBENZAPRINE HCL 10 MG PO TABS
10.0000 mg | ORAL_TABLET | Freq: Three times a day (TID) | ORAL | 1 refills | Status: DC | PRN
  Filled 2022-11-30: qty 30, 10d supply, fill #0

## 2022-11-30 MED ORDER — ROCURONIUM BROMIDE 100 MG/10ML IV SOLN
INTRAVENOUS | Status: DC | PRN
  Administered 2022-11-30: 60 mg via INTRAVENOUS
  Administered 2022-11-30: 10 mg via INTRAVENOUS

## 2022-11-30 MED ORDER — VANCOMYCIN HCL 1000 MG IV SOLR
INTRAVENOUS | Status: DC | PRN
  Administered 2022-11-30: 1000 mg

## 2022-11-30 MED ORDER — MIDAZOLAM HCL 2 MG/2ML IJ SOLN
INTRAMUSCULAR | Status: AC
  Filled 2022-11-30: qty 2

## 2022-11-30 MED ORDER — PROPOFOL 10 MG/ML IV BOLUS
INTRAVENOUS | Status: DC | PRN
  Administered 2022-11-30: 170 mg via INTRAVENOUS

## 2022-11-30 MED ORDER — METOCLOPRAMIDE HCL 5 MG/ML IJ SOLN: 5.0000 mg | Freq: Three times a day (TID) | INTRAMUSCULAR | Status: DC | PRN

## 2022-11-30 MED ORDER — ALBUMIN HUMAN 5 % IV SOLN
INTRAVENOUS | Status: AC
  Filled 2022-11-30: qty 250

## 2022-11-30 MED ORDER — LIDOCAINE HCL (CARDIAC) PF 100 MG/5ML IV SOSY
PREFILLED_SYRINGE | INTRAVENOUS | Status: DC | PRN
  Administered 2022-11-30: 100 mg via INTRAVENOUS

## 2022-11-30 MED ORDER — VASOPRESSIN 20 UNIT/ML IV SOLN
INTRAVENOUS | Status: DC | PRN
Start: 2022-11-30 — End: 2022-11-30
  Administered 2022-11-30 (×2): 1 [IU] via INTRAVENOUS

## 2022-11-30 MED ORDER — HYDROMORPHONE HCL 4 MG PO TABS
4.0000 mg | ORAL_TABLET | ORAL | 0 refills | Status: DC | PRN
Start: 2022-11-30 — End: 2023-08-24
  Filled 2022-11-30: qty 15, 3d supply, fill #0

## 2022-11-30 MED ORDER — SUGAMMADEX SODIUM 200 MG/2ML IV SOLN
INTRAVENOUS | Status: DC | PRN
  Administered 2022-11-30: 50 mg via INTRAVENOUS
  Administered 2022-11-30: 200 mg via INTRAVENOUS

## 2022-11-30 MED ORDER — OXYCODONE HCL 10 MG PO TABS
10.0000 mg | ORAL_TABLET | Freq: Three times a day (TID) | ORAL | 0 refills | Status: DC | PRN
Start: 2022-11-30 — End: 2023-09-10
  Filled 2022-11-30: qty 30, 10d supply, fill #0

## 2022-11-30 MED ORDER — VANCOMYCIN HCL 1000 MG IV SOLR
INTRAVENOUS | Status: AC
  Filled 2022-11-30: qty 20

## 2022-11-30 MED ORDER — BUPIVACAINE LIPOSOME 1.3 % IJ SUSP
INTRAMUSCULAR | Status: DC | PRN
  Administered 2022-11-30: 10 mL via PERINEURAL

## 2022-11-30 MED ORDER — 0.9 % SODIUM CHLORIDE (POUR BTL) OPTIME
TOPICAL | Status: DC | PRN
  Administered 2022-11-30: 1000 mL

## 2022-11-30 MED ORDER — FENTANYL CITRATE (PF) 100 MCG/2ML IJ SOLN
INTRAMUSCULAR | Status: AC
  Filled 2022-11-30: qty 2

## 2022-11-30 MED ORDER — CHLORHEXIDINE GLUCONATE 0.12 % MT SOLN
15.0000 mL | Freq: Once | OROMUCOSAL | Status: AC
  Administered 2022-11-30: 15 mL via OROMUCOSAL

## 2022-11-30 MED ORDER — METOCLOPRAMIDE HCL 5 MG PO TABS
5.0000 mg | ORAL_TABLET | Freq: Three times a day (TID) | ORAL | Status: DC | PRN
  Filled 2022-11-30: qty 2

## 2022-11-30 MED ORDER — ONDANSETRON HCL 4 MG/2ML IJ SOLN
INTRAMUSCULAR | Status: AC
  Filled 2022-11-30: qty 2

## 2022-11-30 MED ORDER — ONDANSETRON HCL 4 MG/2ML IJ SOLN: 4.0000 mg | Freq: Four times a day (QID) | INTRAMUSCULAR | Status: DC | PRN

## 2022-11-30 MED ORDER — ALBUMIN HUMAN 5 % IV SOLN
INTRAVENOUS | Status: DC | PRN
Start: 2022-11-30 — End: 2022-11-30

## 2022-11-30 MED ORDER — STERILE WATER FOR IRRIGATION IR SOLN
Status: DC | PRN
  Administered 2022-11-30: 2000 mL

## 2022-11-30 MED ORDER — DEXAMETHASONE SODIUM PHOSPHATE 10 MG/ML IJ SOLN
INTRAMUSCULAR | Status: DC | PRN
  Administered 2022-11-30 (×2): 4 mg via INTRAVENOUS

## 2022-11-30 SURGICAL SUPPLY — 73 items
ADH SKN CLS APL DERMABOND .7 (GAUZE/BANDAGES/DRESSINGS) ×1
AID PSTN UNV HD RSTRNT DISP (MISCELLANEOUS) ×1
BAG COUNTER SPONGE SURGICOUNT (BAG) IMPLANT
BAG SPEC THK2 15X12 ZIP CLS (MISCELLANEOUS) ×1
BAG SPNG CNTER NS LX DISP (BAG)
BAG ZIPLOCK 12X15 (MISCELLANEOUS) ×2 IMPLANT
BIT DRILL AR 3 NS (BIT) IMPLANT
BLADE SAW SGTL 83.5X18.5 (BLADE) IMPLANT
BSPLAT GLND +2X24 MDLR (Joint) ×1 IMPLANT
COOLER ICEMAN CLASSIC (MISCELLANEOUS) IMPLANT
COVER BACK TABLE 60X90IN (DRAPES) ×2 IMPLANT
COVER SURGICAL LIGHT HANDLE (MISCELLANEOUS) ×2 IMPLANT
CUP SUT UNIV REVERS 42 NEUT (Shoulder) IMPLANT
DERMABOND ADVANCED .7 DNX12 (GAUZE/BANDAGES/DRESSINGS) ×2 IMPLANT
DRAPE INCISE IOBAN 66X45 STRL (DRAPES) IMPLANT
DRAPE ORTHO SPLIT 77X108 STRL (DRAPES) ×2
DRAPE SHEET LG 3/4 BI-LAMINATE (DRAPES) ×2 IMPLANT
DRAPE SURG 17X11 SM STRL (DRAPES) ×2 IMPLANT
DRAPE SURG ORHT 6 SPLT 77X108 (DRAPES) ×4 IMPLANT
DRAPE U-SHAPE 47X51 STRL (DRAPES) ×2 IMPLANT
DRSG AQUACEL AG ADV 3.5X 6 (GAUZE/BANDAGES/DRESSINGS) IMPLANT
DRSG AQUACEL AG ADV 3.5X10 (GAUZE/BANDAGES/DRESSINGS) IMPLANT
DRSG TEGADERM 8X12 (GAUZE/BANDAGES/DRESSINGS) ×2 IMPLANT
DURAPREP 26ML APPLICATOR (WOUND CARE) ×2 IMPLANT
ELECT BLADE TIP CTD 4 INCH (ELECTRODE) ×2 IMPLANT
ELECT PENCIL ROCKER SW 15FT (MISCELLANEOUS) ×2 IMPLANT
ELECT REM PT RETURN 15FT ADLT (MISCELLANEOUS) ×2 IMPLANT
FACESHIELD WRAPAROUND (MASK) ×4
FACESHIELD WRAPAROUND OR TEAM (MASK) ×8 IMPLANT
GLENOID SYS 42 +4 LAT/24 SHLDR (Miscellaneous) IMPLANT
GLENOID UNI REV MOD 24 +2 LAT (Joint) IMPLANT
GLOVE BIO SURGEON STRL SZ7.5 (GLOVE) ×2 IMPLANT
GLOVE BIO SURGEON STRL SZ8 (GLOVE) ×2 IMPLANT
GLOVE SS BIOGEL STRL SZ 7 (GLOVE) ×2 IMPLANT
GLOVE SS BIOGEL STRL SZ 7.5 (GLOVE) ×2 IMPLANT
GOWN STRL SURGICAL XL XLNG (GOWN DISPOSABLE) ×4 IMPLANT
INSERT HUM CUP L/42 +6 (Insert) IMPLANT
KIT BASIN OR (CUSTOM PROCEDURE TRAY) ×2 IMPLANT
KIT TURNOVER KIT A (KITS) IMPLANT
MANIFOLD NEPTUNE II (INSTRUMENTS) ×2 IMPLANT
NDL TAPERED W/ NITINOL LOOP (MISCELLANEOUS) IMPLANT
NEEDLE TAPERED W/ NITINOL LOOP (MISCELLANEOUS)
NS IRRIG 1000ML POUR BTL (IV SOLUTION) ×2 IMPLANT
PACK SHOULDER (CUSTOM PROCEDURE TRAY) ×2 IMPLANT
PAD ARMBOARD 7.5X6 YLW CONV (MISCELLANEOUS) ×2 IMPLANT
PAD COLD SHLDR WRAP-ON (PAD) IMPLANT
PIN SET MODULAR GLENOID SYSTEM (PIN) IMPLANT
RESTRAINT HEAD UNIVERSAL NS (MISCELLANEOUS) ×2 IMPLANT
SCREW CENTRAL MOD 30MM (Screw) IMPLANT
SCREW PERI LOCK 5.5X24 (Screw) IMPLANT
SCREW PERI LOCK 5.5X32 (Screw) IMPLANT
SCREW PERIPHERAL 5.5X20 LOCK (Screw) IMPLANT
SLING ARM FOAM STRAP LRG (SOFTGOODS) IMPLANT
SLING ARM FOAM STRAP MED (SOFTGOODS) IMPLANT
SMARTMIX MINI TOWER (MISCELLANEOUS)
SPONGE T-LAP 4X18 ~~LOC~~+RFID (SPONGE) IMPLANT
STEM HUMERAL UNI REVERSE SZ10 (Stem) IMPLANT
SUCTION TUBE FRAZIER 12FR DISP (SUCTIONS) ×2 IMPLANT
SUT MNCRL AB 3-0 PS2 18 (SUTURE) ×2 IMPLANT
SUT MON AB 2-0 CT1 36 (SUTURE) ×2 IMPLANT
SUT VIC AB 1 CT1 36 (SUTURE) ×2 IMPLANT
SUT VIC AB 2-0 CT1 27 (SUTURE) ×1
SUT VIC AB 2-0 CT1 TAPERPNT 27 (SUTURE) ×2 IMPLANT
SUTURE TAPE 1.3 40 TPR END (SUTURE) IMPLANT
SUTURETAPE 1.3 40 TPR END (SUTURE)
SWAB COLLECTION DEVICE MRSA (MISCELLANEOUS) IMPLANT
SWAB CULTURE ESWAB REG 1ML (MISCELLANEOUS) IMPLANT
TOWEL OR 17X26 10 PK STRL BLUE (TOWEL DISPOSABLE) ×2 IMPLANT
TOWEL OR NON WOVEN STRL DISP B (DISPOSABLE) ×2 IMPLANT
TOWER SMARTMIX MINI (MISCELLANEOUS) IMPLANT
TUBE SUCTION HIGH CAP CLEAR NV (SUCTIONS) ×2 IMPLANT
TUBING CONNECTING 10 (TUBING) ×2 IMPLANT
WATER STERILE IRR 1000ML POUR (IV SOLUTION) ×2 IMPLANT

## 2022-11-30 NOTE — Evaluation (Signed)
Occupational Therapy Evaluation Patient Details Name: Victor [name] MRN: [id number] DOB: Apr 25, 1955 Today's Date: 11/30/2022   History of Present Illness Victor [name] is a 67 yr old male who is s/p removal of left shoulder arthroplasty and conversion to a reverse shoulder arthroplasty on 11-30-22, due to failed L shoulder arthroplasty.   Clinical Impression   Pt is s/p shoulder replacement of left non-dominant upper extremity on 11-30-22. Therapist provided education and instruction to patient and spouse with regards to ROM/exercises, post-op precautions, UE positioning, donning upper extremity clothing, recommendations for bathing while maintaining shoulder precautions, use of ice for pain and edema management, use of ice machine, and donning/doffing sling. Patient and spouse verbalized and demonstrated understanding as needed. Patient needed assistance to donn shirt, underwear, shorts, socks and shoes with instruction on compensatory strategies to perform ADLs. Patient to follow up with MD for further therapy needs.         If plan is discharge home, recommend the following: A [place] help with bathing/dressing/bathroom;Assistance with cooking/housework;Assist for transportation;Help with stairs or ramp for entrance    Functional Status Assessment  Patient has had a recent decline in their functional status and demonstrates the ability to make significant improvements in function in a reasonable and predictable amount of time.  Equipment Recommendations  None recommended by OT    Recommendations for Other Services       Precautions / Restrictions Precautions Precautions: Shoulder Type of Shoulder Precautions: If sitting in controlled environment, ok to come out of sling to give neck a break. Please sleep in it to protect until follow up in office.    OK to use operative arm for feeding, hygiene and ADLs.  Ok to instruct Pendulums and lap slides as exercises. Ok to use operative arm  within the following parameters for ADL purposes    New ROM (8/18)  Ok for PROM, AAROM, AROM within pain tolerance and within the following ROM  ER 20  ABD 45  FE 60 Shoulder Interventions: Shoulder sling/immobilizer Precaution Booklet Issued: Yes (comment) Required Braces or Orthoses: Sling Restrictions Weight Bearing Restrictions: Yes LUE Weight Bearing: Non weight bearing Other Position/Activity Restrictions: okay to perform LUE elbow, wrist, and hand ROM      Mobility Bed Mobility    General bed mobility comments: pt was received seated in chair    Transfers Overall transfer level: Needs assistance Equipment used: None Transfers: Sit to/from Stand Sit to Stand: Contact guard assist                      ADL either performed or assessed with clinical judgement       Pertinent Vitals/Pain Pain Assessment Pain Assessment: 0-10 Pain Score: 4  Pain Location: L shoulder Pain Intervention(s): Limited activity within patient's tolerance, Monitored during session        Communication Communication Communication: No apparent difficulties   Cognition Arousal: Alert Behavior During Therapy: WFL for tasks assessed/performed Overall Cognitive Status: Within Functional Limits for tasks assessed          General Comments: Oriented x4, able to follow 1-2 step commands consistently           Shoulder Instructions Shoulder Instructions Donning/doffing shirt without moving shoulder: Minimal assistance (with pt's spouse performing & providing teach back) Method for sponge bathing under operated UE: Caregiver independent with task Donning/doffing sling/immobilizer: Minimal assistance (with pt's spouse performing & providing teach back) Correct positioning of sling/immobilizer: Minimal assistance (with pt's spouse performing & providing teach  back) Pendulum exercises (written home exercise program): Caregiver independent with task;Patient able to independently direct  caregiver ROM for elbow, wrist and digits of operated UE: Caregiver independent with task;Patient able to independently direct caregiver Sling wearing schedule (on at all times/off for ADL's): Caregiver independent with task Proper positioning of operated UE when showering: Caregiver independent with task Dressing change:  (N/A) Positioning of UE while sleeping: Caregiver independent with task    Home Living Family/patient expects to be discharged to:: Private residence Living Arrangements: Spouse/significant other   Type of Home: House Home Access: Stairs to enter Secretary/administrator of Steps: 3 Entrance Stairs-Rails: Left;Right Home Layout: One level     Bathroom Shower/Tub: Tub/shower unit         Home Equipment: Teacher, English as a foreign language (2 wheels);Cane - single point;Shower seat          Prior Functioning/Environment Prior Level of Function : Driving;Independent/Modified Independent             Mobility Comments: Independent for ambulation. ADLs Comments: He reported being modified independent with dressing and bathing, requiring increased time and effort, given shoulder pain and ROM limitations.        OT Problem List: Impaired UE functional use      OT Treatment/Interventions:   No further treatment needs in the acute care setting      OT Frequency:  N/A       AM-PAC OT "6 Clicks" Daily Activity     Outcome Measure Help from another person eating meals?: None Help from another person taking care of personal grooming?: None Help from another person toileting, which includes using toliet, bedpan, or urinal?: A [place] Help from another person bathing (including washing, rinsing, drying)?: A [place] Help from another person to put on and taking off regular upper body clothing?: A Shoe Help from another person to put on and taking off regular lower body clothing?: A [place]afari 6 Click Score: 20   End of Session Equipment Utilized During Treatment:   (N/A) Nurse Communication: Other (comment) (shoulder education completed)  Activity Tolerance: Patient tolerated treatment well Patient left: in chair;with call bell/phone within reach;with family/visitor present  OT Visit Diagnosis: Muscle weakness (generalized) (M62.81)                Time: 1427-1500 OT Time Calculation (min): 33 min Charges:  OT General Charges $OT Visit: 1 Visit OT Evaluation $OT Eval Moderate Complexity: 1 Mod OT Treatments $Self Care/Home Management : 8-22 mins    Reuben Likes, OTR/l 11/30/2022, 5:28 PM

## 2022-11-30 NOTE — Anesthesia Procedure Notes (Signed)
Anesthesia Regional Block: Interscalene brachial plexus block   Pre-Anesthetic Checklist: , timeout performed,  Correct Patient, Correct Site, Correct Laterality,  Correct Procedure, Correct Position, site marked,  Risks and benefits discussed,  Pre-op evaluation,  At surgeon's request and post-op pain management  Laterality: Left  Prep: Maximum Sterile Barrier Precautions used, chloraprep       Needles:  Injection technique: Single-shot  Needle Type: Echogenic Stimulator Needle     Needle Length: 4cm  Needle Gauge: 22     Additional Needles:   Procedures:,,,, ultrasound used (permanent image in chart),,    Narrative:  Start time: 11/30/2022 10:00 AM End time: 11/30/2022 10:03 AM Injection made incrementally with aspirations every 5 mL.  Performed by: Personally  Anesthesiologist: Kaylyn Layer, MD  Additional Notes: Risks, benefits, and alternative discussed. Patient gave consent for procedure. Patient prepped and draped in sterile fashion. Sedation administered, patient remains easily responsive to voice. Relevant anatomy identified with ultrasound guidance. Local anesthetic given in 5cc increments with no signs or symptoms of intravascular injection. No pain or paraesthesias with injection. Patient monitored throughout procedure with signs of LAST or immediate complications. Tolerated well. Ultrasound image placed in chart.  Victor Greenhouse, MD

## 2022-11-30 NOTE — Transfer of Care (Signed)
Immediate Anesthesia Transfer of Care Note  Patient: Victor [name]  Procedure(s) Performed: Removal Left anatomic shoulder arthroplasty and conversion to Reverse shoulder arthroplasty (Left: Shoulder)  Patient Location: PACU  Anesthesia Type:General  Level of Consciousness: awake, alert , oriented, and patient cooperative  Airway & Oxygen Therapy: Patient Spontanous Breathing and Patient connected to face mask oxygen  Post-op Assessment: Report given to RN and Post -op Vital signs reviewed and stable  Post vital signs: Reviewed and stable  Last Vitals:  Vitals Value Taken Time  BP 132/84 11/30/22 1247  Temp    Pulse 78 11/30/22 1249  Resp 14 11/30/22 1249  SpO2 100 % 11/30/22 1249  Vitals shown include unfiled device data.  Last Pain:  Vitals:   11/30/22 0826  PainSc: 0-No pain         Complications: No notable events documented.

## 2022-11-30 NOTE — Anesthesia Postprocedure Evaluation (Signed)
Anesthesia Post Note  Patient: Victor [name]  Procedure(s) Performed: Removal Left anatomic shoulder arthroplasty and conversion to Reverse shoulder arthroplasty (Left: Shoulder)     Patient location during evaluation: PACU Anesthesia Type: General Level of consciousness: awake and alert Pain management: pain level controlled Vital Signs Assessment: post-procedure vital signs reviewed and stable Respiratory status: spontaneous breathing, nonlabored ventilation and respiratory function stable Cardiovascular status: blood pressure returned to baseline Postop Assessment: no apparent nausea or vomiting Anesthetic complications: no   No notable events documented.  Last Vitals:  Vitals:   11/30/22 1415 11/30/22 1445  BP: 127/86 (!) 122/94  Pulse: 71 79  Resp:    Temp: 36.6 C   SpO2: 96% 98%    Last Pain:  Vitals:   11/30/22 1515  TempSrc:   PainSc: 0-No pain   Pain Goal:                   Shanda Howells

## 2022-11-30 NOTE — Anesthesia Procedure Notes (Signed)
Procedure Name: Intubation Date/Time: 11/30/2022 10:51 AM  Performed by: Garth Bigness, CRNAPre-anesthesia Checklist: Patient identified, Emergency Drugs available, Suction available and Patient being monitored Patient Re-evaluated:Patient Re-evaluated prior to induction Oxygen Delivery Method: Circle system utilized Preoxygenation: Pre-oxygenation with 100% oxygen Induction Type: IV induction Ventilation: Mask ventilation without difficulty Laryngoscope Size: Mac and 4 Grade View: Grade I Tube type: Oral Tube size: 7.5 mm Number of attempts: 1 Placement Confirmation: ETT inserted through vocal cords under direct vision, positive ETCO2 and breath sounds checked- equal and bilateral Secured at: 23 cm Tube secured with: Tape Dental Injury: Teeth and Oropharynx as per pre-operative assessment

## 2022-11-30 NOTE — Discharge Instructions (Signed)
? ?Victor [name], M.D., F.A.A.O.S. ?Orthopaedic Surgery ?Specializing in Arthroscopic and Reconstructive ?Surgery of the Shoulder ?[phone] ?[address] - [place],  [address] - Fax [phone] ? ? ?POST-OP TOTAL SHOULDER REPLACEMENT INSTRUCTIONS ? ?1. Follow up in the office for your first post-op appointment 10-14 days from the date of your surgery. If you do not already have a scheduled appointment, our office will contact you to schedule. ? ?2. The bandage over your incision is waterproof. You may begin showering with this dressing on. You may leave this dressing on until first follow up appointment within 2 weeks. We prefer you leave this dressing in place until follow up however after 5-7 days if you are having itching or skin irritation and would like to remove it you may do so. Go slow and tug at the borders gently to break the bond the dressing has with the skin. At this point if there is no drainage it is okay to go without a bandage or you may cover it with a light guaze and tape. You can also expect significant bruising around your shoulder that will drift down your arm and into your chest wall. This is very normal and should resolve over several days. ? ? 3. Wear your sling/immobilizer at all times except to perform the exercises below or to occasionally let your arm dangle by your side to stretch your elbow. You also need to sleep in your sling immobilizer until instructed otherwise. It is ok to remove your sling if you are sitting in a controlled environment and allow your arm to rest in a position of comfort by your side or on your lap with pillows to give your neck and skin a break from the sling. You may remove it to allow arm to dangle by side to shower. If you are up walking around and when you go to sleep at night you need to wear it. ? ?4. Range of motion to your elbow, wrist, and hand are encouraged 3-5 times daily. Exercise to your hand and fingers helps to reduce  swelling you may experience. ? ? ?5. Prescriptions for a pain medication and a muscle relaxant are provided for you. It is recommended that if you are experiencing pain that you pain medication alone is not controlling, add the muscle relaxant along with the pain medication which can give additional pain relief. The first 1-2 days is generally the most severe of your pain and then should gradually decrease. As your pain lessens it is recommended that you decrease your use of the pain medications to an "as needed basis'" only and to always comply with the recommended dosages of the pain medications. ? ?6. Pain medications can produce constipation along with their use. If you experience this, the use of an over the counter stool softener or laxative daily is recommended.  ? ?7. For additional questions or concerns, please do not hesitate to call the office. If after hours there is an answering service to forward your concerns to the physician on call. ? ?8.Pain control following an exparel block ? ?To help control your post-operative pain you received a nerve block  performed with Exparel which is a long acting anesthetic (numbing agent) which can provide pain relief and sensations of numbness (and relief of pain) in the operative shoulder and arm for up to 3 days. Sometimes it provides mixed relief, meaning you may still have numbness in certain areas of the arm but can still be able to   move  parts of that arm, hand, and fingers. We recommend that your prescribed pain medications  be used as needed. We do not feel it is necessary to "pre medicate" and "stay ahead" of pain.  Taking narcotic pain medications when you are not having any pain can lead to unnecessary and potentially dangerous side effects.   ? ?9. Use the ice machine as much as possible in the first 5-7 days from surgery, then you can wean its use to as needed. The ice typically needs to be replaced every 6 hours, instead of ice you can actually freeze  water bottles to put in the cooler and then fill water around them to avoid having to purchase ice. You can have spare water bottles freezing to allow you to rotate them once they have melted. Try to have a thin shirt or light cloth or towel under the ice wrap to protect your skin.  ? ?FOR ADDITIONAL INFO ON ICE MACHINE AND INSTRUCTIONS GO TO THE WEBSITE AT ? ?https://www.djoglobal.com/products/donjoy/donjoy-iceman-classic3 ? ?10.  We recommend that you avoid any dental work or cleaning in the first 3 months following your joint replacement. This is to help minimize the possibility of infection from the bacteria in your mouth that enters your bloodstream during dental work. We also recommend that you take an antibiotic prior to your dental work for the first year after your shoulder replacement to further help reduce that risk. Please simply contact our office for antibiotics to be sent to your pharmacy prior to dental work. ? ?11. Dental Antibiotics: ? ?We recommend waiting at least 3 months for any dental work even cleanings unless there is a dental emergency. We also recommend  prophylactic antibiotics for all dental procdeures  the first year following your joint replacement. In some exceptions we recommend them to be used lifelong. We will provide you with that prescription in follow up office visits, or you can call our office. ? ?Exceptions are as follows: ? ?1. History of prior total joint infection ? ?2. Severely immunocompromised (Organ Transplant, cancer chemotherapy, Rheumatoid biologic ?meds such as Humera) ? ?3. Poorly controlled diabetes (A1C &gt; 8.0, blood glucose over 200) ? ? ?POST-OP EXERCISES ? ?Pendulum Exercises ? ?Perform pendulum exercises while standing and bending at the waist. Support your uninvolved arm on a table or chair and allow your operated arm to hang freely. Make sure to do these exercises passively - not using you shoulder muscles. These exercises can be performed once your  nerve block effects have worn off. ? ?Repeat 20 times. Do 3 sessions per day. ? ? ?  ?

## 2022-11-30 NOTE — Op Note (Signed)
11/30/2022  12:19 PM  PATIENT:   Victor [name]  67 y.o. male  PRE-OPERATIVE DIAGNOSIS:  Failed Left shoulder anatomic arthroplasty  POST-OPERATIVE DIAGNOSIS: Same with operative findings of complete disruption of the anterior and superior rotator cuff  PROCEDURE: Removal of left shoulder stemless anatomic arthroplasty and conversion to a reverse shoulder arthroplasty utilizing a press-fit size 10 Arthrex stem with a neutral metaphysis, +6 constrained polyethylene insert, 42/+4 glenosphere on a small/+2 baseplate  SURGEON:  Alejo Beamer, Vania Rea M.D.  ASSISTANTS: Ralene Bathe, PA-C  Ralene Bathe, PA-C was utilized as an Geophysicist/field seismologist throughout this case, essential for help with positioning the patient, positioning extremity, tissue manipulation, implantation of the prosthesis, suture management, wound closure, and intraoperative decision-making.  ANESTHESIA:   General Endotracheal and interscalene block with Exparel  EBL: 200 cc  SPECIMEN: Soft tissue sent for routine culture and sensitivity x 3: #1 anterior capsule, #2 periprosthetic tissue, #3 glenoid  Drains: None   PATIENT DISPOSITION:  PACU - hemodynamically stable.    PLAN OF CARE: Discharge to home after PACU  Brief history:  Patient is a 67 year old gentleman well-known to our practice status post previous bilateral shoulder anatomic arthroplasties who has unfortunately gone on to catastrophic disruption of the rotator cuffs with significant pain in both shoulders left much more problematic than the right.  Due to his continued functional limitations, pain, and failure to respond to prolonged attempts of conservative management, he is brought to the operating this time for planned conversion of his left shoulder anatomic arthroplasty to a reverse arthroplasty.  Preoperatively, I counseled the patient regarding treatment options and risks versus benefits thereof.  Possible surgical complications were all reviewed including  potential for bleeding, infection, neurovascular injury, persistent pain, loss of motion, anesthetic complication, failure of the implant, and possible need for additional surgery. They understand and accept and agrees with our planned procedure.   Procedure detail:  After undergoing routine preop evaluation the patient received prophylactic antibiotics and interscalene block with Exparel was established in the holding area by the anesthesia department.  Subsequently placed spine on the operating table and underwent this with induction of a general endotracheal anesthesia.  Placed into the beachchair position and appropriately padded and protected.  Left shoulder examination under anesthesia revealed external rotation to 90 degrees with no tension of the soft tissues suggesting complete disruption of the soft tissue rotator cuff envelope.  The left shoulder girdle region was sterilely prepped and draped in standard fashion.  Timeout was called.  A left shoulder deltopectoral approach was made to the previous incision.  Skin flaps elevated dissection carried deeply and the deltopectoral interval was then developed from proximal to this with the vein taken laterally.  Adhesions were then divided beneath the deltoid and the conjoined tendon was mobilized and retracted medially.  Deep soft tissue envelope was then divided with electrocautery we did indeed find complete disruption of the subscapularis.  Divided the capsular attachment along the anterior and inferior margins of the humeral neck and this allowed deliver the humeral head through the wound.  A rondure was then used to remove the soft tissues at the margin of the humeral head prosthesis and osteotome were then used to elevate the humeral head.  Capsular tissue from the anterior region was sent for culture as was tissue beneath the humeral head.  We then removed the cage screw of our stemless implant and then with an osteotome were able to elevate the  trunnion presuming the majority of the proximal  humeral bone.  An oscillating saw was then used to make a freshening cut across the humeral metaphysis and a metal cap was then placed across the metaphysis and this was a size large.  We then exposed the glenoid and sequentially removed the soft tissue at the margin of the glenoid and glenoid was well-fixed.  An osteotome was then used to carefully mobilized and removed the glenoid in its entirety as a single piece and then beneath glenoid we took soft tissue specimens and these were sent for our final tissue specimen.  We then evaluated the dimensions of the glenoid and this was large enough to easily accommodate a size 42 glenosphere.  We passed a guidepin into the glenoid achieving excellent purchase and then reamed with the central followed by the peripheral reamer to a stable subchondral bony bed measuring for a 30 mm lag screw.  This point a preparation completed with the central drill and tap.  Our baseplate was assembled and then inserted with vancomycin powder applied to the threads of the lag screw and excellent fixation was achieved.  All of the peripheral locking screws were then placed using standard technique with excellent fixation.  The wound was irrigated.  A 42/+4 glenosphere was then impacted onto the baseplate and a central locking screw was placed.  We then returned our attention back to the humeral metaphysis and the open the canal by hand reaming and ultimately broached up to a size 10.  I should mention that we removed all the previously placed soft tissue suture and anchors from the previous subscapularis repair.  A neutral metaphyseal reaming guide was then used.  The metaphysis and a trial implant was then placed and a trial reduction showed good motion stability and soft tissue balance.  This point the trial was then removed.  The final implant was assembled.  The canal was irrigated cleaned and dried and vancomycin powder sprayed into the  canal.  Our final implant was then seated with excellent fixation.  Trial reductions were then performed and ultimately felt that a +6 poly gave is the best soft tissue balance motion and stability.  A final +6 constrained poly was then impacted the implant and our final implant was then reduced showing good motion stability and soft tissue balance.  The wound was then copiously irrigated.  Final hemostasis was obtained.  The deltopectoral interval was reapproximated with a series of figure-of-eight number Vicryl sutures after we had placed the balance of the vancomycin powder about the deep soft tissue planes.  2-0 Monocryl used to close the subcu layer and intracuticular 3-0 Monocryl used to close the skin followed by Dermabond and Aquacel dressing.  The left arm was placed into a sling and the patient was awakened, extubated, and taken to the recovery in stable condition.  Senaida Lange MD   Contact # [phone]

## 2022-11-30 NOTE — Anesthesia Preprocedure Evaluation (Addendum)
Anesthesia Evaluation  Patient identified by MRN, date of birth, ID band Patient awake    Reviewed: Allergy & Precautions, NPO status , Patient's Chart, lab work & pertinent test results, reviewed documented beta blocker date and time   History of Anesthesia Complications Negative for: history of anesthetic complications  Airway Mallampati: II  TM Distance: >3 FB Neck ROM: Full    Dental no notable dental hx.    Pulmonary sleep apnea , former smoker   Pulmonary exam normal        Cardiovascular hypertension, Pt. on medications and Pt. on home beta blockers + CAD, + CABG (04/2022) and + Peripheral Vascular Disease  Normal cardiovascular exam     Neuro/Psych   Anxiety     Multiple cervical fusions    GI/Hepatic Neg liver ROS,GERD  Medicated,,  Endo/Other  negative endocrine ROS    Renal/GU Renal InsufficiencyRenal disease  negative genitourinary   Musculoskeletal  (+) Arthritis ,  Fibromyalgia -Failed Left shoulder anatomic arthroplasty   Abdominal   Peds  Hematology negative hematology ROS (+)   Anesthesia Other Findings Day of surgery medications reviewed with patient.  Reproductive/Obstetrics negative OB ROS                              Anesthesia Physical Anesthesia Plan  ASA: 3  Anesthesia Plan: General   Post-op Pain Management: Tylenol PO (pre-op)* and Regional block*   Induction: Intravenous  PONV Risk Score and Plan: 2 and Treatment may vary due to age or medical condition, Ondansetron, Dexamethasone and Midazolam  Airway Management Planned: Oral ETT  Additional Equipment: None  Intra-op Plan:   Post-operative Plan: Extubation in OR  Informed Consent: I have reviewed the patients History and Physical, chart, labs and discussed the procedure including the risks, benefits and alternatives for the proposed anesthesia with the patient or authorized representative who has  indicated his/her understanding and acceptance.     Dental advisory given  Plan Discussed with: CRNA  Anesthesia Plan Comments:          Anesthesia Quick Evaluation

## 2022-11-30 NOTE — H&P (Signed)
Victor [name]    Chief Complaint: Failed Left shoulder anatomic arthroplasty HPI: The patient is a 67 y.o. male well-known to our practice after previous bilateral shoulder anatomic arthroplasties.  Patient has now unfortunately developed catastrophic dysfunction/disruption of the subscapularis in both shoulders with subsequent weakness and loss of function.  His left shoulder is much more symptomatic than the right.  Patient is brought to the operating this time for planned conversion of left shoulder anatomic arthroplasty to reverse shoulder arthroplasty.  Past Medical History:  Diagnosis Date   Allergic rhinitis    Anxiety    Arteriosclerosis of thoracic aorta (HCC)    Arthritis    "all over" (04/15/2013)   Back pain, chronic    BMI 32.0-32.9,adult    Cervicalgia    Chronic neck pain    Coronary artery disease 2023   COVID-19    DDD (degenerative disc disease), cervical    DDD (degenerative disc disease), lumbar    Diverticulosis    Elevated hemoglobin (HCC)    Fibromyalgia    GERD (gastroesophageal reflux disease)    Gout    Headache    High cholesterol    History of bronchitis    Hyperkalemia    Hypertension    Insomnia    Kidney disease with benign hypertension    Leg cramps    Right greater than left   Low testosterone    Melanoma (HCC)    Nose   MRSA infection    Neuropathy of both feet    Peripheral vascular disease (HCC)    Personal history of colonic polyps    Pre-diabetes    Radiculopathy of lumbar region    Raynaud phenomenon    Recurrent sinus infections    Right sided sciatica    Sleep apnea    "don't use CPAP" (04/15/2013)   Tobacco dependence       Past Surgical History:  Procedure Laterality Date   ANTERIOR CERVICAL DECOMP/DISCECTOMY FUSION  2000's X3   "had 3 surgeries on my front neck" (04/15/2013)   ANTERIOR LAT LUMBAR FUSION Left 08/06/2017   Procedure: LEFT LUMBAR TWO-THREE ANTERIOR LATERAL LUMBAR FUSION WITH PERCUTANEOUS SCREWS;   Surgeon: Julio Sicks, MD;  Location: [facility] OR;  Service: Neurosurgery;  Laterality: Left;   APPENDECTOMY  ~ 2010   BACK SURGERY  05/31/2021   Lumbar fusion L4-L5 L5-S1 with Dr. Laurell Roof TUNNEL RELEASE Left 1990's   CATARACT EXTRACTION, BILATERAL     COLONOSCOPY     CORONARY ARTERY BYPASS GRAFT N/A 05/19/2022   Procedure: CORONARY ARTERY BYPASS GRAFTING (CABG) X FIVE BYPASSES USING OPEN LEFT INTERNAL MAMMARY ARTERY AND ENDOSCOPIC LEFT GREATER SAPHENOUS VEIN HARVEST;  Surgeon: Alleen Borne, MD;  Location: [facility] OR;  Service: Open Heart Surgery;  Laterality: N/A;   CORONARY PRESSURE/FFR STUDY N/A 02/03/2022   Procedure: INTRAVASCULAR PRESSURE WIRE/FFR STUDY;  Surgeon: Kathleene Hazel, MD;  Location: [facility];  Service: Cardiovascular;  Laterality: N/A;   ear drum surgery  1974   EYE SURGERY Right    FLEXIBLE SIGMOIDOSCOPY  12/21/2011   Procedure: FLEXIBLE SIGMOIDOSCOPY;  Surgeon: Shirley Friar, MD;  Location: [facility];  Service: Endoscopy;  Laterality: N/A;   HAND TENDON SURGERY Left ~ 1978 X2-1980's X3   "had 5 ORs after laceration"   HOT HEMOSTASIS  12/21/2011   Procedure: HOT HEMOSTASIS (ARGON PLASMA COAGULATION/BICAP);  Surgeon: Shirley Friar, MD;  Location: [facility];  Service: Endoscopy;  Laterality: N/A;   INGUINAL HERNIA  REPAIR Left 04/15/2013   Procedure: LAPAROSCOPIC REPAIR OF INCARCERATED LEFT INGUINAL HERNIA;  Surgeon: Atilano Ina, MD;  Location: [facility] OR;  Service: General;  Laterality: Left;   INGUINAL HERNIA REPAIR Bilateral 1970's - 1984   INGUINAL HERNIA REPAIR Left 04/15/2013   INSERTION OF MESH Left 04/15/2013   Procedure: INSERTION OF MESH;  Surgeon: Atilano Ina, MD;  Location: [facility] OR;  Service: General;  Laterality: Left;   JOINT REPLACEMENT     right knee   KNEE ARTHROSCOPY Right 1990's   LAPAROSCOPY N/A 04/15/2013   Procedure: LAPAROSCOPY DIAGNOSTIC;  Surgeon: Atilano Ina, MD;  Location: [facility] OR;  Service: General;  Laterality:  N/A;   LEFT HEART CATH AND CORONARY ANGIOGRAPHY N/A 02/03/2022   Procedure: LEFT HEART CATH AND CORONARY ANGIOGRAPHY;  Surgeon: Kathleene Hazel, MD;  Location: [facility];  Service: Cardiovascular;  Laterality: N/A;   LUMBAR LAMINECTOMY/DECOMPRESSION MICRODISCECTOMY Right 07/22/2015   Procedure: Right Lumbar Two-Three Microdiscectomy;  Surgeon: Julio Sicks, MD;  Location: [facility]URO [facility];  Service: Neurosurgery;  Laterality: Right;   LUMBAR LAMINECTOMY/DECOMPRESSION MICRODISCECTOMY Right 09/05/2016   Procedure: Microdiscectomy - right - T12-L1;  Surgeon: Julio Sicks, MD;  Location: [facility] OR;  Service: Neurosurgery;  Laterality: Right;   NASAL SEPTUM SURGERY Bilateral 2014   "removed polyps & infection"   POLYPECTOMY  01/10/2011   Procedure: POLYPECTOMY;  Surgeon: Shirley Friar, MD;  Location: [facility];  Service: Endoscopy;  Laterality: N/A;   POSTERIOR CERVICAL LAMINECTOMY Left 05/24/2020   Procedure: Laminectomy and Foraminotomy - left - Cervical two-Cervical three;  Surgeon: Julio Sicks, MD;  Location: [facility] OR;  Service: Neurosurgery;  Laterality: Left;   POSTERIOR LUMBAR FUSION  11/1995   "ray cages" (04/15/2013)   SHOULDER ARTHROSCOPY Bilateral    "twice each"   TEE WITHOUT CARDIOVERSION N/A 05/19/2022   Procedure: TRANSESOPHAGEAL ECHOCARDIOGRAM;  Surgeon: Alleen Borne, MD;  Location: [facility] OR;  Service: Open Heart Surgery;  Laterality: N/A;   TONSILLECTOMY  1960's   TOTAL KNEE ARTHROPLASTY Right 02/24/2014   Procedure: TOTAL RIGHT KNEE ARTHROPLASTY;  Surgeon: Shelda Pal, MD;  Location: [facility] ORS;  Service: Orthopedics;  Laterality: Right;   TOTAL SHOULDER ARTHROPLASTY Left 09/18/2019   Procedure: TOTAL SHOULDER ARTHROPLASTY;  Surgeon: Francena Hanly, MD;  Location: [facility] ORS;  Service: Orthopedics;  Laterality: Left;    TOTAL SHOULDER ARTHROPLASTY Right 11/04/2020   Procedure: TOTAL SHOULDER ARTHROPLASTY;  Surgeon: Francena Hanly, MD;  Location: [facility] ORS;  Service:  Orthopedics;  Laterality: Right;    UPPER GI ENDOSCOPY     VASECTOMY  1988   WISDOM TOOTH EXTRACTION  2000's    Family History  Problem Relation Age of Onset   Hypertension Father    Cancer Mother        breast    Heart Problems Mother    Alzheimer's disease Mother    Allergies Daughter    Interstitial cystitis Daughter     Social History:  reports that he quit smoking about 12 years ago. His smoking use included cigarettes and cigars. He started smoking about 27 years ago. He has a 1.5 pack-year smoking history. He has never been exposed to tobacco smoke. He has quit using smokeless tobacco.  His smokeless tobacco use included chew. He reports current alcohol use of about 5.0 standard drinks of alcohol per week. He reports that he does not use drugs.  BMI: Estimated body mass index is 33.75 kg/m as calculated from the following:  Height as of 11/13/22: 5\' 8"  (1.727 m).   Weight as of 11/13/22: 100.7 kg.  Lab Results  Component Value Date   ALBUMIN 4.4 08/02/2022   Diabetes: Patient does not have a diagnosis of diabetes. Lab Results  Component Value Date   HGBA1C 6.0 (H) 05/17/2022     Smoking Status:       No medications prior to admission.     Physical Exam: Left shoulder incision is well-healed without evidence of erythema or induration.  Minimal tenderness.  He has painful motion with marked limitations in mobility as noted at recent office visits.  He otherwise remains neurovascular intact in left upper extremity.  Diagnostic imaging confirms short stem anatomic arthroplasty in overall good position and alignment.  Diagnostic workup includes inflammatory parameters within normal limits.  Vitals     Assessment/Plan  Impression: Failed Left shoulder anatomic arthroplasty  Plan of Action: Procedure(s): Removal Left anatomic shoulder arthroplasty and conversion to Reverse shoulder arthroplasty  Roma Bierlein M Destony Prevost 11/30/2022, 6:44 AM Contact #  [phone]

## 2022-12-01 ENCOUNTER — Encounter (HOSPITAL_COMMUNITY): Payer: Self-pay | Admitting: Orthopedic Surgery

## 2022-12-03 ENCOUNTER — Other Ambulatory Visit (HOSPITAL_COMMUNITY): Payer: Self-pay

## 2022-12-05 LAB — AEROBIC/ANAEROBIC CULTURE W GRAM STAIN (SURGICAL/DEEP WOUND)
Culture: NO GROWTH
Culture: NO GROWTH
Culture: NO GROWTH
Gram Stain: NONE SEEN
Gram Stain: NONE SEEN
Gram Stain: NONE SEEN

## 2022-12-06 ENCOUNTER — Other Ambulatory Visit (HOSPITAL_COMMUNITY): Payer: Self-pay

## 2022-12-06 MED ORDER — HYDROMORPHONE HCL 4 MG PO TABS
4.0000 mg | ORAL_TABLET | Freq: Four times a day (QID) | ORAL | 0 refills | Status: DC | PRN
Start: 2022-12-06 — End: 2023-08-24
  Filled 2022-12-06: qty 15, 4d supply, fill #0

## 2022-12-12 ENCOUNTER — Other Ambulatory Visit (HOSPITAL_COMMUNITY): Payer: Self-pay

## 2022-12-12 ENCOUNTER — Telehealth: Payer: Self-pay | Admitting: Pharmacy Technician

## 2022-12-12 NOTE — Telephone Encounter (Signed)
Pharmacy Patient Advocate Encounter   Received notification from CoverMyMeds that prior authorization for repatha is required/requested.   Insurance verification completed.   The patient is insured through [facility] .   Per test claim: PA required; PA submitted to [facility] via CoverMyMeds Key/confirmation #/EOC [id number] Status is pending

## 2022-12-12 NOTE — Telephone Encounter (Signed)
Pharmacy Patient Advocate Encounter  Received notification from [facility] that Prior Authorization for repatha has been APPROVED from 12/12/22 to 02/27/24. Ran test claim, Copay is $141.27- one month (GAP). This test claim was processed through [facility]- copay amounts may vary at other pharmacies due to pharmacy/plan contracts, or as the patient moves through the different stages of their insurance plan.   PA #/Case ID/Reference #: [id number]

## 2022-12-13 DIAGNOSIS — Z96612 Presence of left artificial shoulder joint: Secondary | ICD-10-CM | POA: Diagnosis not present

## 2022-12-14 NOTE — Discharge Summary (Signed)
PATIENT ID:      Victor [name]  MRN:     [id number] DOB/AGE:    67-Dec-1957 / 67 y.o.     DISCHARGE SUMMARY  ADMISSION DATE:    11/30/2022 DISCHARGE DATE:  11/30/2022  ADMISSION DIAGNOSIS: Failed Left shoulder anatomic arthroplasty Past Medical History:  Diagnosis Date   Allergic rhinitis    Anxiety    Arteriosclerosis of thoracic aorta (HCC)    Arthritis    "all over" (04/15/2013)   Back pain, chronic    BMI 32.0-32.9,adult    Cervicalgia    Chronic neck pain    Coronary artery disease 2023   COVID-19    DDD (degenerative disc disease), cervical    DDD (degenerative disc disease), lumbar    Diverticulosis    Elevated hemoglobin (HCC)    Fibromyalgia    GERD (gastroesophageal reflux disease)    Gout    Headache    High cholesterol    History of bronchitis    Hyperkalemia    Hypertension    Insomnia    Kidney disease with benign hypertension    Leg cramps    Right greater than left   Low testosterone    Melanoma (HCC)    Nose   MRSA infection    Neuropathy of both feet    Peripheral vascular disease (HCC)    Personal history of colonic polyps    Pre-diabetes    Radiculopathy of lumbar region    Raynaud phenomenon    Recurrent sinus infections    Right sided sciatica    Sleep apnea    "don't use CPAP" (04/15/2013)   Tobacco dependence     DISCHARGE DIAGNOSIS:   Principal Problem:   S/P reverse total shoulder arthroplasty, left   PROCEDURE: Procedure(s): Removal Left anatomic shoulder arthroplasty and conversion to Reverse shoulder arthroplasty on 11/30/2022  CONSULTS:    HISTORY:  See H&P in chart.  HOSPITAL COURSE:  Victor [name] is a 67 y.o. admitted on 11/30/2022 with a diagnosis of Failed Left shoulder anatomic arthroplasty.  They were brought to the operating room on 11/30/2022 and underwent Procedure(s): Removal Left anatomic shoulder arthroplasty and conversion to Reverse shoulder arthroplasty.    They were given perioperative  antibiotics:  Anti-infectives (From admission, onward)    Start     Dose/Rate Route Frequency Ordered Stop   11/30/22 1121  vancomycin (VANCOCIN) powder  Status:  Discontinued          As needed 11/30/22 1121 11/30/22 2111   11/30/22 0830  ceFAZolin (ANCEF) IVPB 2g/100 mL premix        2 g 200 mL/hr over 30 Minutes Intravenous On call to O.R. 11/30/22 0865 11/30/22 1111     .  Patient underwent the above named procedure and tolerated it well. The following day they were hemodynamically stable and pain was controlled on oral analgesics. They were neurovascularly intact to the operative extremity. OT was ordered and worked with patient per protocol. They were medically and orthopaedically stable for discharge on 11/30/2022.    DIAGNOSTIC STUDIES:  RECENT RADIOGRAPHIC STUDIES :  No results found.  RECENT VITAL SIGNS:  No data found.Marland Kitchen  RECENT EKG RESULTS:    Orders placed or performed during the hospital encounter of 05/19/22   EKG 12-Lead   EKG 12-Lead   EKG 12-Lead   EKG 12-Lead   EKG 12-Lead   EKG 12-Lead    DISCHARGE INSTRUCTIONS:  Discharge Instructions     Discharge patient  Complete by: As directed    Discharge disposition: 01-Home or Self Care   Discharge patient date: 11/30/2022       DISCHARGE MEDICATIONS:   Allergies as of 11/30/2022       Reactions   Tetracyclines & Related Other (See Comments)   " makes me angry"   Amoxicillin Diarrhea, Nausea Only   Crestor [rosuvastatin]    Joint pain    Doxycycline Other (See Comments)   Upset stomach   Niacin And Related Itching        Medication List     TAKE these medications    acetaminophen 500 MG tablet Commonly known as: TYLENOL Take 500-1,000 mg by mouth every 8 (eight) hours as needed for moderate pain.   allopurinol 300 MG tablet Commonly known as: ZYLOPRIM TAKE 1 AND 1/2 TABLETS BY MOUTH DAILY   ALPRAZolam 1 MG tablet Commonly known as: XANAX Take 1 mg by mouth 2 (two) times daily as  needed for anxiety.   amLODipine 5 MG tablet Commonly known as: NORVASC Take 5 mg by mouth in the morning.   aspirin EC 325 MG tablet Take 1 tablet (325 mg total) by mouth daily.   b complex vitamins tablet Take 1 tablet by mouth in the morning.   Biotin 5000 MCG Caps Take 5,000 mcg by mouth in the morning.   CALCIUM 600 PO Take 600 mg by mouth in the morning.   carboxymethylcellulose 0.5 % Soln Commonly known as: REFRESH PLUS Place 1 drop into both eyes 2 (two) times daily as needed (dry/irritated eyes.).   colchicine 0.6 MG tablet TAKE 1 TABLET (0.6 MG TOTAL) BY MOUTH 2 (TWO) TIMES DAILY AS NEEDED.   cyclobenzaprine 10 MG tablet Commonly known as: FLEXERIL Take 10 mg by mouth 2 (two) times daily as needed for muscle spasms. What changed: Another medication with the same name was added. Make sure you understand how and when to take each.   cyclobenzaprine 10 MG tablet Commonly known as: FLEXERIL Take 1 tablet (10 mg total) by mouth 3 (three) times daily as needed for muscle spasms. What changed: You were already taking a medication with the same name, and this prescription was added. Make sure you understand how and when to take each.   fish oil-omega-3 fatty acids 1000 MG capsule Take 1 g by mouth in the morning and at bedtime.   fluticasone 50 MCG/ACT nasal spray Commonly known as: FLONASE Place 1 spray into both nostrils 2 (two) times daily. For congestion   GINSENG PO Take 400 mg by mouth in the morning.   guaiFENesin 600 MG 12 hr tablet Commonly known as: MUCINEX Take 600 mg by mouth at bedtime as needed (Congestion).   hydrocortisone cream 1 % Apply 1 application topically daily as needed for itching.   HYDROmorphone 4 MG tablet Commonly known as: Dilaudid Take 1 tablet (4 mg total) by mouth every 4 (four) hours as needed for severe pain (post op pain for the first 48-72 hours).   ibuprofen 200 MG tablet Commonly known as: ADVIL Take 200 mg by mouth  daily as needed (pain.).   loratadine 10 MG tablet Commonly known as: CLARITIN Take 10 mg by mouth in the morning.   magnesium gluconate 500 MG tablet Commonly known as: MAGONATE Take 500 mg by mouth in the morning.   Melatonin 10 MG Tabs Take 10 mg by mouth at bedtime as needed (sleep).   METAMUCIL FIBER PO Take 1 Scoop by mouth daily in the  afternoon.   metoprolol tartrate 25 MG tablet Commonly known as: LOPRESSOR Take 0.5 tablets (12.5 mg total) by mouth 2 (two) times daily.   MSM 1000 MG Caps Take 1,000 mg by mouth in the morning.   multivitamin with minerals tablet Take 1 tablet by mouth in the morning.   NEOSPORIN EX Apply 1 Application topically 3 (three) times daily as needed (irritation/wound care).   nitroGLYCERIN 0.4 MG SL tablet Commonly known as: NITROSTAT Dissolve 1 tablet under the tongue every 5 minutes as needed for chest pain. Max of 3 doses, then 911.   NONFORMULARY OR COMPOUNDED ITEM Apply 1 g topically daily as needed (foot discomfort/pain.). Doxepin 5%, Amantadine 3%, Pentoxifylline 3%, Lamotrigine 2%, Sertraline 3%, Gabapentin 6% PCCA Lipoderm Base Cream   omeprazole 40 MG capsule Commonly known as: PRILOSEC Take 40 mg by mouth in the morning.   Oxycodone HCl 10 MG Tabs Take 1 tablet (10 mg total) by mouth 3 (three) times daily as needed (post op pain after out of dilaudid). What changed: reasons to take this   pravastatin 40 MG tablet Commonly known as: PRAVACHOL TAKE 1 TABLET BY MOUTH EVERY DAY IN THE EVENING   Repatha SureClick 140 MG/ML Soaj Generic drug: Evolocumab Inject 140 mg into the skin every 14 (fourteen) days.   selenium 200 MCG Tabs tablet Take 200 mcg by mouth in the morning.   sildenafil 100 MG tablet Commonly known as: VIAGRA Take 50-100 mg by mouth daily as needed for erectile dysfunction.   silodosin 8 MG Caps capsule Commonly known as: RAPAFLO Take 8 mg by mouth at bedtime.   sodium chloride 0.65 % Soln nasal  spray Commonly known as: OCEAN Place 1 spray into both nostrils as needed for congestion.   testosterone cypionate 200 MG/ML injection Commonly known as: DEPOTESTOSTERONE CYPIONATE Inject 100 mg into the muscle every 14 (fourteen) days. 0.5 mL   Turmeric 500 MG Caps Take 500 mg by mouth in the morning.   venlafaxine XR 150 MG 24 hr capsule Commonly known as: EFFEXOR-XR Take 150 mg by mouth daily with breakfast.   vitamin D3 25 MCG tablet Commonly known as: CHOLECALCIFEROL Take 1,000 Units by mouth in the morning.   vitamin E 180 MG (400 UNITS) capsule Take 400 Units by mouth in the morning.        FOLLOW UP VISIT:    Follow-up Information     Francena Hanly, MD Follow up.   Specialty: Orthopedic Surgery Why: 12-13-2022 at 1:45 PM for -post-op Contact information: [address] [employer] [employer] [address] [place] [address] [phone]                 DISCHARGE GN:FAOZ from PACU   DISCHARGE CONDITION:  Victor [name] Victor [name] for Dr. Francena Hanly 12/14/2022, 9:32 AM

## 2022-12-20 ENCOUNTER — Other Ambulatory Visit (HOSPITAL_COMMUNITY): Payer: Self-pay

## 2022-12-20 DIAGNOSIS — G894 Chronic pain syndrome: Secondary | ICD-10-CM | POA: Diagnosis not present

## 2022-12-20 MED ORDER — OXYCODONE HCL 10 MG PO TABS
10.0000 mg | ORAL_TABLET | Freq: Three times a day (TID) | ORAL | 0 refills | Status: DC
Start: 2023-01-19 — End: 2023-09-10
  Filled 2023-01-22: qty 90, 30d supply, fill #0

## 2022-12-20 MED ORDER — CYCLOBENZAPRINE HCL 10 MG PO TABS
10.0000 mg | ORAL_TABLET | Freq: Three times a day (TID) | ORAL | 11 refills | Status: DC
  Filled 2022-12-20: qty 90, 30d supply, fill #0
  Filled 2023-01-22: qty 90, 30d supply, fill #1
  Filled 2023-04-12: qty 90, 30d supply, fill #2
  Filled 2023-06-20: qty 90, 30d supply, fill #3
  Filled 2023-09-06: qty 90, 30d supply, fill #4

## 2022-12-20 MED ORDER — OXYCODONE HCL 10 MG PO TABS
10.0000 mg | ORAL_TABLET | Freq: Three times a day (TID) | ORAL | 0 refills | Status: DC
Start: 2022-12-20 — End: 2023-09-10
  Filled 2022-12-20: qty 90, 30d supply, fill #0

## 2022-12-20 MED ORDER — OXYCODONE HCL 10 MG PO TABS
10.0000 mg | ORAL_TABLET | Freq: Three times a day (TID) | ORAL | 0 refills | Status: DC
Start: 2023-02-17 — End: 2023-03-22
  Filled 2023-02-17: qty 90, 30d supply, fill #0

## 2022-12-22 DIAGNOSIS — R7303 Prediabetes: Secondary | ICD-10-CM | POA: Diagnosis not present

## 2022-12-22 DIAGNOSIS — I10 Essential (primary) hypertension: Secondary | ICD-10-CM | POA: Diagnosis not present

## 2022-12-22 DIAGNOSIS — M542 Cervicalgia: Secondary | ICD-10-CM | POA: Diagnosis not present

## 2022-12-22 DIAGNOSIS — J309 Allergic rhinitis, unspecified: Secondary | ICD-10-CM | POA: Diagnosis not present

## 2022-12-22 DIAGNOSIS — E782 Mixed hyperlipidemia: Secondary | ICD-10-CM | POA: Diagnosis not present

## 2022-12-22 DIAGNOSIS — Z0000 Encounter for general adult medical examination without abnormal findings: Secondary | ICD-10-CM | POA: Diagnosis not present

## 2022-12-22 DIAGNOSIS — M109 Gout, unspecified: Secondary | ICD-10-CM | POA: Diagnosis not present

## 2022-12-22 DIAGNOSIS — K219 Gastro-esophageal reflux disease without esophagitis: Secondary | ICD-10-CM | POA: Diagnosis not present

## 2022-12-22 LAB — COMPREHENSIVE METABOLIC PANEL: EGFR: 65

## 2023-01-01 ENCOUNTER — Telehealth: Payer: Self-pay | Admitting: *Deleted

## 2023-01-01 NOTE — Telephone Encounter (Signed)
Labs received from:[facility]  Drawn on: 12/22/2022  Reviewed by: Sherron Ales, PA-C  Labs drawn: Uric Acid, CBC with Diff  Results:Uric Acid 4.6   Hgb 12.4   MCH 25.7   MCHC 31.9   RDW 17.2

## 2023-01-08 DIAGNOSIS — R948 Abnormal results of function studies of other organs and systems: Secondary | ICD-10-CM | POA: Diagnosis not present

## 2023-01-10 DIAGNOSIS — M25512 Pain in left shoulder: Secondary | ICD-10-CM | POA: Diagnosis not present

## 2023-01-10 DIAGNOSIS — Z96612 Presence of left artificial shoulder joint: Secondary | ICD-10-CM | POA: Diagnosis not present

## 2023-01-16 DIAGNOSIS — R3912 Poor urinary stream: Secondary | ICD-10-CM | POA: Diagnosis not present

## 2023-01-22 ENCOUNTER — Other Ambulatory Visit (HOSPITAL_COMMUNITY): Payer: Self-pay

## 2023-01-29 ENCOUNTER — Other Ambulatory Visit (HOSPITAL_COMMUNITY): Payer: Self-pay

## 2023-01-29 MED ORDER — CLINDAMYCIN HCL 300 MG PO CAPS
600.0000 mg | ORAL_CAPSULE | ORAL | 0 refills | Status: AC
  Filled 2023-01-29: qty 10, 5d supply, fill #0

## 2023-02-08 DIAGNOSIS — M25512 Pain in left shoulder: Secondary | ICD-10-CM | POA: Diagnosis not present

## 2023-02-08 DIAGNOSIS — Z96612 Presence of left artificial shoulder joint: Secondary | ICD-10-CM | POA: Diagnosis not present

## 2023-02-09 ENCOUNTER — Other Ambulatory Visit: Payer: Self-pay | Admitting: Cardiovascular Disease

## 2023-02-17 ENCOUNTER — Other Ambulatory Visit (HOSPITAL_COMMUNITY): Payer: Self-pay

## 2023-02-19 ENCOUNTER — Other Ambulatory Visit (HOSPITAL_COMMUNITY): Payer: Self-pay

## 2023-02-19 ENCOUNTER — Other Ambulatory Visit: Payer: Self-pay

## 2023-02-28 NOTE — Progress Notes (Signed)
 Office Visit Note  Patient: Victor [name]             Date of Birth: 1955-09-27           MRN: [id number]             PCP: Rae Bugler, MD Referring: Rae Bugler, MD Visit Date: 03/14/2023 Occupation: @GUAROCC @  Subjective:  Left hand swelling   History of Present Illness: Victor [name] is a 68 y.o. male with gout and osteoarthritis.  He returns today after his last visit in October 2023.  He has been experiencing swelling in his left hand for the last 3 weeks.  He states he has been taking colchicine  twice a day.  He has been taking allopurinol  300 mg daily without any interruption.  None of the other joints are swollen.  He continues to have discomfort in multiple joints.  He states he consumes red meat and also eats shellfish.  He does not drink any alcohol .  He had coronary artery bypass surgery in March 2024.    Activities of Daily Living:  Patient reports morning stiffness for 15 minutes.   Patient Reports nocturnal pain.  Difficulty dressing/grooming: Reports Difficulty climbing stairs: Reports Difficulty getting out of chair: Reports Difficulty using hands for taps, buttons, cutlery, and/or writing: Reports  Review of Systems  Constitutional:  Positive for fatigue.  HENT:  Positive for mouth dryness. Negative for mouth sores.   Eyes:  Positive for dryness.  Respiratory:  Positive for shortness of breath.   Cardiovascular: Negative.  Negative for chest pain and palpitations.  Gastrointestinal:  Positive for constipation. Negative for blood in stool and diarrhea.  Endocrine: Negative.  Negative for increased urination.  Genitourinary: Negative.  Negative for involuntary urination.  Musculoskeletal:  Positive for joint pain, joint pain, joint swelling, muscle weakness and morning stiffness. Negative for gait problem, myalgias, muscle tenderness and myalgias.  Skin: Negative.  Negative for color change, rash, hair loss and sensitivity to sunlight.   Allergic/Immunologic: Negative.  Negative for susceptible to infections.  Neurological:  Positive for headaches. Negative for dizziness.  Hematological: Negative.  Negative for swollen glands.  Psychiatric/Behavioral:  Positive for sleep disturbance. Negative for depressed mood. The patient is not nervous/anxious.     PMFS History:  Patient Active Problem List   Diagnosis Date Noted   S/P reverse total shoulder arthroplasty, left 11/30/2022   S/P CABG x 5 05/19/2022   Coronary artery disease involving native coronary artery of native heart with angina pectoris (HCC) 02/03/2022   Degenerative spondylolisthesis 05/31/2021   Cervical radiculopathy 05/24/2020   Hypertension    Neuropathy of both feet    Lumbar foraminal stenosis 08/06/2017   HNP (herniated nucleus pulposus), thoracic 09/05/2016   Chronic kidney disease (CKD) 04/26/2016   DJD (degenerative joint disease), cervical 04/26/2016   Essential hypertension 04/26/2016   Hyperlipidemia 04/26/2016   Sleep apnea 04/26/2016   Fibromyalgia 04/18/2016   Idiopathic chronic gout of multiple sites without tophus 04/18/2016   Spondylosis of lumbar region without myelopathy or radiculopathy 04/18/2016   Primary osteoarthritis of both knees 04/18/2016   Lumbar herniated disc 07/22/2015   Morbid obesity (HCC) 02/25/2014   S/P right TKA 02/24/2014   Heartburn 01/10/2011    Past Medical History:  Diagnosis Date   Allergic rhinitis    Anxiety    Arteriosclerosis of thoracic aorta (HCC)    Arthritis    "all over" (04/15/2013)   Back pain, chronic    BMI 32.0-32.9,adult  Cervicalgia    Chronic neck pain    Coronary artery disease 2023   COVID-19    DDD (degenerative disc disease), cervical    DDD (degenerative disc disease), lumbar    Diverticulosis    Elevated hemoglobin (HCC)    Fibromyalgia    GERD (gastroesophageal reflux disease)    Gout    Headache    High cholesterol    History of bronchitis    Hyperkalemia     Hypertension    Insomnia    Kidney disease with benign hypertension    Leg cramps    Right greater than left   Low testosterone     Melanoma (HCC)    Nose   MRSA infection    Neuropathy of both feet    Peripheral vascular disease (HCC)    Personal history of colonic polyps    Pre-diabetes    Radiculopathy of lumbar region    Raynaud phenomenon    Recurrent sinus infections    Right sided sciatica    Sleep apnea    "don't use CPAP" (04/15/2013)   Tobacco dependence     Family History  Problem Relation Age of Onset   Hypertension Father    Cancer Mother        breast    Heart Problems Mother    Alzheimer's disease Mother    Allergies Daughter    Interstitial cystitis Daughter    Past Surgical History:  Procedure Laterality Date   ANTERIOR CERVICAL DECOMP/DISCECTOMY FUSION  2000's X3   "had 3 surgeries on my front neck" (04/15/2013)   ANTERIOR LAT LUMBAR FUSION Left 08/06/2017   Procedure: LEFT LUMBAR TWO-THREE ANTERIOR LATERAL LUMBAR FUSION WITH PERCUTANEOUS SCREWS;  Surgeon: Agustina Aldrich, MD;  Location: [facility] OR;  Service: Neurosurgery;  Laterality: Left;   APPENDECTOMY  ~ 2010   BACK SURGERY  05/31/2021   Lumbar fusion L4-L5 L5-S1 with Dr. Josefine Nice TUNNEL RELEASE Left 1990's   CATARACT EXTRACTION, BILATERAL     COLONOSCOPY     CORONARY ARTERY BYPASS GRAFT N/A 05/19/2022   Procedure: CORONARY ARTERY BYPASS GRAFTING (CABG) X FIVE BYPASSES USING OPEN LEFT INTERNAL MAMMARY ARTERY AND ENDOSCOPIC LEFT GREATER SAPHENOUS VEIN HARVEST;  Surgeon: Bartley Lightning, MD;  Location: [facility] OR;  Service: Open Heart Surgery;  Laterality: N/A;   CORONARY PRESSURE/FFR STUDY N/A 02/03/2022   Procedure: INTRAVASCULAR PRESSURE WIRE/FFR STUDY;  Surgeon: Odie Benne, MD;  Location: [facility];  Service: Cardiovascular;  Laterality: N/A;   ear drum surgery  1974   EYE SURGERY Right    FLEXIBLE SIGMOIDOSCOPY  12/21/2011   Procedure: FLEXIBLE SIGMOIDOSCOPY;  Surgeon: Yvetta Herbert, MD;  Location: [facility];  Service: Endoscopy;  Laterality: N/A;   HAND TENDON SURGERY Left ~ 1978 X2-1980's X3   "had 5 ORs after laceration"   HOT HEMOSTASIS  12/21/2011   Procedure: HOT HEMOSTASIS (ARGON PLASMA COAGULATION/BICAP);  Surgeon: Yvetta Herbert, MD;  Location: [facility];  Service: Endoscopy;  Laterality: N/A;   INGUINAL HERNIA REPAIR Left 04/15/2013   Procedure: LAPAROSCOPIC REPAIR OF INCARCERATED LEFT INGUINAL HERNIA;  Surgeon: Fran Imus, MD;  Location: [facility] OR;  Service: General;  Laterality: Left;   INGUINAL HERNIA REPAIR Bilateral 1970's - 1984   INGUINAL HERNIA REPAIR Left 04/15/2013   INSERTION OF MESH Left 04/15/2013   Procedure: INSERTION OF MESH;  Surgeon: Fran Imus, MD;  Location: [facility] OR;  Service: General;  Laterality: Left;   JOINT REPLACEMENT  right knee   KNEE ARTHROSCOPY Right 1990's   LAPAROSCOPY N/A 04/15/2013   Procedure: LAPAROSCOPY DIAGNOSTIC;  Surgeon: Fran Imus, MD;  Location: [facility] OR;  Service: General;  Laterality: N/A;   LEFT HEART CATH AND CORONARY ANGIOGRAPHY N/A 02/03/2022   Procedure: LEFT HEART CATH AND CORONARY ANGIOGRAPHY;  Surgeon: Odie Benne, MD;  Location: [facility];  Service: Cardiovascular;  Laterality: N/A;   LUMBAR LAMINECTOMY/DECOMPRESSION MICRODISCECTOMY Right 07/22/2015   Procedure: Right Lumbar Two-Three Microdiscectomy;  Surgeon: Agustina Aldrich, MD;  Location: [facility] NEURO OR[facility];  Service: Neurosurgery;  Laterality: Right;   LUMBAR LAMINECTOMY/DECOMPRESSION MICRODISCECTOMY Right 09/05/2016   Procedure: Microdiscectomy - right - T12-L1;  Surgeon: Agustina Aldrich, MD;  Location: [facility] OR;  Service: Neurosurgery;  Laterality: Right;   NASAL SEPTUM SURGERY Bilateral 2014   "removed polyps & infection"   POLYPECTOMY  01/10/2011   Procedure: POLYPECTOMY;  Surgeon: Yvetta Herbert, MD;  Location: [facility];  Service: Endoscopy;  Laterality: N/A;   POSTERIOR CERVICAL LAMINECTOMY Left 05/24/2020    Procedure: Laminectomy and Foraminotomy - left - Cervical two-Cervical three;  Surgeon: Agustina Aldrich, MD;  Location: [facility] OR;  Service: Neurosurgery;  Laterality: Left;   POSTERIOR LUMBAR FUSION  11/1995   "ray cages" (04/15/2013)   SHOULDER ARTHROSCOPY Bilateral    "twice each"   TEE WITHOUT CARDIOVERSION N/A 05/19/2022   Procedure: TRANSESOPHAGEAL ECHOCARDIOGRAM;  Surgeon: Bartley Lightning, MD;  Location: [facility] OR;  Service: Open Heart Surgery;  Laterality: N/A;   TONSILLECTOMY  1960's   TOTAL KNEE ARTHROPLASTY Right 02/24/2014   Procedure: TOTAL RIGHT KNEE ARTHROPLASTY;  Surgeon: Bevin Bucks, MD;  Location: [facility] ORS;  Service: Orthopedics;  Laterality: Right;   TOTAL SHOULDER ARTHROPLASTY Left 09/18/2019   Procedure: TOTAL SHOULDER ARTHROPLASTY;  Surgeon: Ellard Gunning, MD;  Location: [facility] ORS;  Service: Orthopedics;  Laterality: Left;    TOTAL SHOULDER ARTHROPLASTY Right 11/04/2020   Procedure: TOTAL SHOULDER ARTHROPLASTY;  Surgeon: Ellard Gunning, MD;  Location: [facility] ORS;  Service: Orthopedics;  Laterality: Right;    TOTAL SHOULDER REVISION Left 11/30/2022   Procedure: Removal Left anatomic shoulder arthroplasty and conversion to Reverse shoulder arthroplasty;  Surgeon: Ellard Gunning, MD;  Location: [facility] ORS;  Service: Orthopedics;  Laterality: Left;    UPPER GI ENDOSCOPY     VASECTOMY  1988   WISDOM TOOTH EXTRACTION  2000's   Social History   Social History Narrative   Not on file   Immunization History  Administered Date(s) Administered   Fluzone Influenza virus vaccine,trivalent (IIV3), split virus 02/06/2012, 02/14/2013, 12/07/2014, 11/12/2018   Influenza Whole 11/08/2011   Influenza,inj,Quad PF,6+ Mos 03/20/2018   Influenza,inj,quad, With Preservative 11/28/2018   Influenza-Unspecified 11/10/2010, 02/27/2013, 12/10/2020, 12/11/2021, 01/04/2023   PFIZER(Purple Top)SARS-COV-2 Vaccination 06/23/2019, 07/14/2019, 05/14/2020, 09/22/2020   PNEUMOCOCCAL CONJUGATE-20  12/09/2021   Pfizer Covid-19 Vaccine Bivalent Booster 21yrs & up 01/14/2021   Pneumococcal Conjugate-13 09/28/2020   Pneumococcal Polysaccharide-23 06/12/2011   Tdap 12/06/2010, 12/22/2022   Zoster Recombinant(Shingrix) 02/13/2018   Zoster, Live 06/28/2010, 02/13/2018, 10/31/2018     Objective: Vital Signs: BP 134/87   Pulse 81   Resp 16   Ht 5\' 8"  (1.727 m)   Wt 222 lb 12.8 oz (101.1 kg)   BMI 33.88 kg/m    Physical Exam Vitals and nursing note reviewed.  Constitutional:      Appearance: He is well-developed.  HENT:     Head: Normocephalic and atraumatic.  Eyes:     Conjunctiva/sclera: Conjunctivae normal.  Pupils: Pupils are equal, round, and reactive to light.  Cardiovascular:     Rate and Rhythm: Normal rate and regular rhythm.     Heart sounds: Normal heart sounds.  Pulmonary:     Effort: Pulmonary effort is normal.     Breath sounds: Normal breath sounds.  Abdominal:     General: Bowel sounds are normal.     Palpations: Abdomen is soft.  Musculoskeletal:     Cervical back: Normal range of motion and neck supple.  Skin:    General: Skin is warm and dry.     Capillary Refill: Capillary refill takes less than 2 seconds.  Neurological:     Mental Status: He is alert and oriented to person, place, and time.  Psychiatric:        Behavior: Behavior normal.      Musculoskeletal Exam: Limited lateral rotation of the cervical spine was noted.  Limited range of motion of the thoracic and lumbar spine was noted.  He had good range of motion of bilateral shoulder joints which are replaced.  Internal rotation was limited bilaterally.  Elbow joints and wrist joints were in good range of motion.  He had 2 swelling over the dorsum of his left hand.  PIP and DIP thickening with no synovitis was noted.  Hip joints were in good range of motion.  Knee joints were replaced and were in good range of motion.  There was no tenderness over ankles or MTPs.  CDAI Exam: CDAI Score:  -- Patient Global: --; Provider Global: -- Swollen: --; Tender: -- Joint Exam 03/14/2023   No joint exam has been documented for this visit   There is currently no information documented on the homunculus. Go to the Rheumatology activity and complete the homunculus joint exam.  Investigation: No additional findings.  Imaging: No results found.  Recent Labs: Lab Results  Component Value Date   WBC 7.1 11/13/2022   HGB 12.4 (L) 11/13/2022   PLT 251 11/13/2022   NA 136 11/13/2022   K 4.8 11/13/2022   CL 103 11/13/2022   CO2 25 11/13/2022   GLUCOSE 119 (H) 11/13/2022   BUN 16 11/13/2022   CREATININE 0.86 11/13/2022   BILITOT 0.3 08/02/2022   ALKPHOS 73 08/02/2022   AST 34 08/02/2022   ALT 19 10/02/2022   PROT 7.2 08/02/2022   ALBUMIN  4.4 08/02/2022   CALCIUM  9.0 11/13/2022   GFRAA >60 09/09/2019   December 22, 2022 uric acid 4.6 hemoglobin A1c 6.2  Speciality Comments: No specialty comments available.  Procedures:  No procedures performed Allergies: Tetracyclines & related, Amoxicillin, Crestor  [rosuvastatin ], Doxycycline, and Niacin and related   Assessment / Plan:     Visit Diagnoses: Idiopathic chronic gout of multiple sites without tophus -patient returns today almost after 2 years later.  He states he has been taking allopurinol  and colchicine  on a regular basis.  He takes colchicine  only for flares.  He states he started having a flare of gout about 3 weeks ago on the dorsum of his left hand and started taking colchicine .  He has not noticed much improvement.  He continues to have pain and discomfort.  He request prednisone  taper.  He has had prednisone  taper in the past without any problems.  He has not had a gout flare in a long time.  He requested a refill for allopurinol .  He is currently on allopurinol  450 mg po daily and colchicine  0.6 mg 1 tablet by mouth BID as needed.  Patient  states he has not been watching his diet and has been consuming red meat and  shellfish.  Dietary modifications were discussed at length.  His last uric acid level was 4.6 on December 22, 2022.- Plan: Uric acid, predniSONE  (DELTASONE ) 5 MG tablet, allopurinol  (ZYLOPRIM ) 300 MG tablet.  A prednisone  taper starting at 20 mg and taper by 5 mg every 2 days was sent.  Side effects of prednisone  including increased risk of weight gain, hypertension, diabetes, cataracts, osteoporosis were discussed.  Medication monitoring encounter - Plan: CBC with Differential/Platelet, COMPLETE METABOLIC PANEL WITH GFR today.  Status post shoulder replacement, right - November 04, 2020-Dr. Supple.  Internal rotation was limited.  Status post shoulder replacement, left - July 22,2021 by Dr. Alfredo Ano.  October 2020 for revision by Dr. Alfredo Ano.  He had good range of motion except for some limitation with internal rotation.  Iliotibial band syndrome of left side-he has intermittent discomfort.  Primary osteoarthritis of both hands-he can history of pain and stiffness in his bilateral hands.  Bilateral PIP and DIP thickening was noted.  He had swelling over the dorsum of his left hand.  Primary osteoarthritis of left knee-hip good range of motion without discomfort.  History of total right knee replacement (TKR)-no discomfort.  Primary osteoarthritis of both feet-presented and discomfort in his feet.  Proper fitting shoes were advised.  DDD (degenerative disc disease), cervical-he had similar addition with lateral rotation.  Spondylosis of lumbar region without myelopathy or radiculopathy - Status post fusion 05/2021 by Dr. Gwendlyn Lemmings.  He continues to have some discomfort and limitation of range of motion.  Fibromyalgia-he goes to pain management.  He continues to have generalized pain and discomfort.  Other medical problems listed as follows:  History of hypertension  S/P CABG x 5 in March 2024.  History of gastroesophageal reflux (GERD)  History of hypercholesterolemia  History of sleep  apnea  History of hiatal hernia  History of insomnia  Orders: Orders Placed This Encounter  Procedures   CBC with Differential/Platelet   COMPLETE METABOLIC PANEL WITH GFR   Uric acid   Meds ordered this encounter  Medications   predniSONE  (DELTASONE ) 5 MG tablet    Sig: Take 4 tabs po x 2 days, 3  tabs po x 2 days, 2 tabs po x 2 days, 1  tab po x 2 days    Dispense:  20 tablet    Refill:  0   allopurinol  (ZYLOPRIM ) 300 MG tablet    Sig: Take 1.5 tablets (450 mg total) by mouth daily.    Dispense:  135 tablet    Refill:  0     Follow-Up Instructions: Return in about 6 months (around 09/11/2023) for Gout, Osteoarthritis.   Nicholas Bari, MD  Note - This record has been created using Animal nutritionist.  Chart creation errors have been sought, but may not always  have been located. Such creation errors do not reflect on  the standard of medical care.

## 2023-03-06 ENCOUNTER — Other Ambulatory Visit (HOSPITAL_COMMUNITY): Payer: Self-pay

## 2023-03-06 MED ORDER — METHYLPREDNISOLONE 4 MG PO TBPK
ORAL_TABLET | ORAL | 0 refills | Status: DC
  Filled 2023-03-06: qty 21, 6d supply, fill #0

## 2023-03-07 DIAGNOSIS — Z96612 Presence of left artificial shoulder joint: Secondary | ICD-10-CM | POA: Diagnosis not present

## 2023-03-14 ENCOUNTER — Ambulatory Visit: Payer: Medicare Other | Attending: Rheumatology | Admitting: Rheumatology

## 2023-03-14 ENCOUNTER — Encounter: Payer: Self-pay | Admitting: Rheumatology

## 2023-03-14 VITALS — BP 134/87 | HR 81 | Resp 16 | Ht 68.0 in | Wt 222.8 lb

## 2023-03-14 DIAGNOSIS — M7632 Iliotibial band syndrome, left leg: Secondary | ICD-10-CM

## 2023-03-14 DIAGNOSIS — Z5181 Encounter for therapeutic drug level monitoring: Secondary | ICD-10-CM

## 2023-03-14 DIAGNOSIS — M1A09X0 Idiopathic chronic gout, multiple sites, without tophus (tophi): Secondary | ICD-10-CM | POA: Diagnosis not present

## 2023-03-14 DIAGNOSIS — Z8669 Personal history of other diseases of the nervous system and sense organs: Secondary | ICD-10-CM

## 2023-03-14 DIAGNOSIS — M19072 Primary osteoarthritis, left ankle and foot: Secondary | ICD-10-CM

## 2023-03-14 DIAGNOSIS — Z87898 Personal history of other specified conditions: Secondary | ICD-10-CM

## 2023-03-14 DIAGNOSIS — M19071 Primary osteoarthritis, right ankle and foot: Secondary | ICD-10-CM

## 2023-03-14 DIAGNOSIS — M1712 Unilateral primary osteoarthritis, left knee: Secondary | ICD-10-CM | POA: Diagnosis not present

## 2023-03-14 DIAGNOSIS — Z8719 Personal history of other diseases of the digestive system: Secondary | ICD-10-CM

## 2023-03-14 DIAGNOSIS — Z8639 Personal history of other endocrine, nutritional and metabolic disease: Secondary | ICD-10-CM

## 2023-03-14 DIAGNOSIS — Z96612 Presence of left artificial shoulder joint: Secondary | ICD-10-CM

## 2023-03-14 DIAGNOSIS — M797 Fibromyalgia: Secondary | ICD-10-CM | POA: Diagnosis not present

## 2023-03-14 DIAGNOSIS — M5030 Other cervical disc degeneration, unspecified cervical region: Secondary | ICD-10-CM | POA: Diagnosis not present

## 2023-03-14 DIAGNOSIS — M47816 Spondylosis without myelopathy or radiculopathy, lumbar region: Secondary | ICD-10-CM

## 2023-03-14 DIAGNOSIS — Z8679 Personal history of other diseases of the circulatory system: Secondary | ICD-10-CM

## 2023-03-14 DIAGNOSIS — Z96611 Presence of right artificial shoulder joint: Secondary | ICD-10-CM

## 2023-03-14 DIAGNOSIS — M19042 Primary osteoarthritis, left hand: Secondary | ICD-10-CM

## 2023-03-14 DIAGNOSIS — Z951 Presence of aortocoronary bypass graft: Secondary | ICD-10-CM

## 2023-03-14 DIAGNOSIS — M19041 Primary osteoarthritis, right hand: Secondary | ICD-10-CM

## 2023-03-14 DIAGNOSIS — Z96651 Presence of right artificial knee joint: Secondary | ICD-10-CM | POA: Diagnosis not present

## 2023-03-14 MED ORDER — PREDNISONE 5 MG PO TABS: ORAL_TABLET | ORAL | 0 refills | Status: DC

## 2023-03-14 MED ORDER — ALLOPURINOL 300 MG PO TABS: 450.0000 mg | ORAL_TABLET | Freq: Every day | ORAL | 0 refills | Status: DC

## 2023-03-15 LAB — COMPLETE METABOLIC PANEL WITH GFR
AG Ratio: 1.4 (calc) (ref 1.0–2.5)
ALT: 21 U/L (ref 9–46)
AST: 24 U/L (ref 10–35)
Albumin: 4.3 g/dL (ref 3.6–5.1)
Alkaline phosphatase (APISO): 77 U/L (ref 35–144)
BUN: 23 mg/dL (ref 7–25)
CO2: 26 mmol/L (ref 20–32)
Calcium: 9.2 mg/dL (ref 8.6–10.3)
Chloride: 101 mmol/L (ref 98–110)
Creat: 0.98 mg/dL (ref 0.70–1.35)
Globulin: 3 g/dL (ref 1.9–3.7)
Glucose, Bld: 111 mg/dL (ref 65–139)
Potassium: 5.2 mmol/L (ref 3.5–5.3)
Sodium: 135 mmol/L (ref 135–146)
Total Bilirubin: 0.5 mg/dL (ref 0.2–1.2)
Total Protein: 7.3 g/dL (ref 6.1–8.1)
eGFR: 85 mL/min/{1.73_m2} (ref >=60)

## 2023-03-15 LAB — CBC WITH DIFFERENTIAL/PLATELET
Absolute Lymphocytes: 2387 {cells}/uL (ref 850–3900)
Absolute Monocytes: 1285 {cells}/uL — ABNORMAL HIGH (ref 200–950)
Basophils Absolute: 32 {cells}/uL (ref 0–200)
Basophils Relative: 0.3 %
Eosinophils Absolute: 335 {cells}/uL (ref 15–500)
Eosinophils Relative: 3.1 %
HCT: 39.4 % (ref 38.5–50.0)
Hemoglobin: 12.5 g/dL — ABNORMAL LOW (ref 13.2–17.1)
MCH: 25.1 pg — ABNORMAL LOW (ref 27.0–33.0)
MCHC: 31.7 g/dL — ABNORMAL LOW (ref 32.0–36.0)
MCV: 79.1 fL — ABNORMAL LOW (ref 80.0–100.0)
MPV: 9.7 fL (ref 7.5–12.5)
Monocytes Relative: 11.9 %
Neutro Abs: 6761 {cells}/uL (ref 1500–7800)
Neutrophils Relative %: 62.6 %
Platelets: 387 10*3/uL (ref 140–400)
RBC: 4.98 10*6/uL (ref 4.20–5.80)
RDW: 16.8 % — ABNORMAL HIGH (ref 11.0–15.0)
Total Lymphocyte: 22.1 %
WBC: 10.8 10*3/uL (ref 3.8–10.8)

## 2023-03-15 LAB — URIC ACID: Uric Acid, Serum: 3.9 mg/dL — ABNORMAL LOW (ref 4.0–8.0)

## 2023-03-15 NOTE — Progress Notes (Signed)
Hemoglobin is low and stable, uric acid 3.9 in the desirable range, CMP normal.  No change in treatment advised.

## 2023-03-22 ENCOUNTER — Other Ambulatory Visit (HOSPITAL_COMMUNITY): Payer: Self-pay

## 2023-03-22 DIAGNOSIS — G894 Chronic pain syndrome: Secondary | ICD-10-CM | POA: Diagnosis not present

## 2023-03-22 DIAGNOSIS — M5416 Radiculopathy, lumbar region: Secondary | ICD-10-CM | POA: Diagnosis not present

## 2023-03-22 MED ORDER — OXYCODONE HCL 10 MG PO TABS
10.0000 mg | ORAL_TABLET | Freq: Three times a day (TID) | ORAL | 0 refills | Status: DC
  Filled 2023-05-21: qty 90, 30d supply, fill #0

## 2023-03-22 MED ORDER — OXYCODONE HCL 10 MG PO TABS
10.0000 mg | ORAL_TABLET | Freq: Three times a day (TID) | ORAL | 0 refills | Status: DC
  Filled 2023-03-22: qty 90, 30d supply, fill #0

## 2023-03-22 MED ORDER — OXYCODONE HCL 10 MG PO TABS
10.0000 mg | ORAL_TABLET | Freq: Three times a day (TID) | ORAL | 0 refills | Status: DC
  Filled 2023-04-23: qty 90, 30d supply, fill #0

## 2023-04-23 ENCOUNTER — Other Ambulatory Visit (HOSPITAL_COMMUNITY): Payer: Self-pay

## 2023-04-23 ENCOUNTER — Other Ambulatory Visit: Payer: Self-pay

## 2023-05-08 ENCOUNTER — Other Ambulatory Visit (HOSPITAL_COMMUNITY): Payer: Self-pay

## 2023-05-08 MED ORDER — METHYLPREDNISOLONE 4 MG PO TBPK
ORAL_TABLET | ORAL | 0 refills | Status: DC
Start: 2023-05-08 — End: 2023-06-20
  Filled 2023-05-08: qty 21, 6d supply, fill #0

## 2023-05-15 DIAGNOSIS — L218 Other seborrheic dermatitis: Secondary | ICD-10-CM | POA: Diagnosis not present

## 2023-05-15 DIAGNOSIS — L814 Other melanin hyperpigmentation: Secondary | ICD-10-CM | POA: Diagnosis not present

## 2023-05-15 DIAGNOSIS — L821 Other seborrheic keratosis: Secondary | ICD-10-CM | POA: Diagnosis not present

## 2023-05-15 DIAGNOSIS — D225 Melanocytic nevi of trunk: Secondary | ICD-10-CM | POA: Diagnosis not present

## 2023-05-15 DIAGNOSIS — L570 Actinic keratosis: Secondary | ICD-10-CM | POA: Diagnosis not present

## 2023-05-21 ENCOUNTER — Other Ambulatory Visit (HOSPITAL_COMMUNITY): Payer: Self-pay

## 2023-05-22 ENCOUNTER — Other Ambulatory Visit (HOSPITAL_COMMUNITY): Payer: Self-pay

## 2023-05-23 DIAGNOSIS — M25562 Pain in left knee: Secondary | ICD-10-CM | POA: Diagnosis not present

## 2023-05-23 DIAGNOSIS — M1712 Unilateral primary osteoarthritis, left knee: Secondary | ICD-10-CM | POA: Diagnosis not present

## 2023-05-23 DIAGNOSIS — Z96651 Presence of right artificial knee joint: Secondary | ICD-10-CM | POA: Diagnosis not present

## 2023-05-31 DIAGNOSIS — M5416 Radiculopathy, lumbar region: Secondary | ICD-10-CM | POA: Diagnosis not present

## 2023-05-31 DIAGNOSIS — M5412 Radiculopathy, cervical region: Secondary | ICD-10-CM | POA: Diagnosis not present

## 2023-06-04 ENCOUNTER — Other Ambulatory Visit: Payer: Self-pay | Admitting: Neurosurgery

## 2023-06-04 DIAGNOSIS — M5412 Radiculopathy, cervical region: Secondary | ICD-10-CM

## 2023-06-04 DIAGNOSIS — M5416 Radiculopathy, lumbar region: Secondary | ICD-10-CM

## 2023-06-10 ENCOUNTER — Other Ambulatory Visit: Payer: Self-pay | Admitting: Rheumatology

## 2023-06-10 DIAGNOSIS — M1A09X0 Idiopathic chronic gout, multiple sites, without tophus (tophi): Secondary | ICD-10-CM

## 2023-06-11 NOTE — Telephone Encounter (Signed)
 Last Fill: 03/14/2023  Labs: 03/14/2023 Hemoglobin is low and stable, uric acid 3.9 in the desirable range, CMP normal.  No change in treatment advised.   Next Visit: 09/12/2023  Last Visit: 03/14/2023  DX: Idiopathic chronic gout of multiple sites without tophus   Current Dose per office note 03/14/2023: allopurinol 450 mg po daily    Okay to refill Allopurinol?

## 2023-06-20 ENCOUNTER — Other Ambulatory Visit (HOSPITAL_COMMUNITY): Payer: Self-pay

## 2023-06-20 ENCOUNTER — Other Ambulatory Visit: Payer: Self-pay

## 2023-06-20 DIAGNOSIS — M5416 Radiculopathy, lumbar region: Secondary | ICD-10-CM | POA: Diagnosis not present

## 2023-06-20 DIAGNOSIS — M5412 Radiculopathy, cervical region: Secondary | ICD-10-CM | POA: Diagnosis not present

## 2023-06-20 DIAGNOSIS — G894 Chronic pain syndrome: Secondary | ICD-10-CM | POA: Diagnosis not present

## 2023-06-20 MED ORDER — OXYCODONE HCL 10 MG PO TABS
10.0000 mg | ORAL_TABLET | Freq: Three times a day (TID) | ORAL | 0 refills | Status: DC
  Filled 2023-07-20: qty 90, 30d supply, fill #0

## 2023-06-20 MED ORDER — OXYCODONE HCL 10 MG PO TABS
10.0000 mg | ORAL_TABLET | Freq: Three times a day (TID) | ORAL | 0 refills | Status: DC
  Filled 2023-08-18 – 2023-08-21 (×2): qty 90, 30d supply, fill #0

## 2023-06-20 MED ORDER — OXYCODONE HCL 10 MG PO TABS
10.0000 mg | ORAL_TABLET | Freq: Three times a day (TID) | ORAL | 0 refills | Status: DC
  Filled 2023-06-20: qty 90, 30d supply, fill #0

## 2023-06-22 ENCOUNTER — Ambulatory Visit
Admission: RE | Admit: 2023-06-22 | Discharge: 2023-06-22 | Disposition: A | Discharge status: Home / Self Care | Source: Ambulatory Visit | Attending: Neurosurgery | Admitting: Neurosurgery

## 2023-06-22 DIAGNOSIS — Z981 Arthrodesis status: Secondary | ICD-10-CM | POA: Diagnosis not present

## 2023-06-22 DIAGNOSIS — M542 Cervicalgia: Secondary | ICD-10-CM | POA: Diagnosis not present

## 2023-06-22 DIAGNOSIS — M5416 Radiculopathy, lumbar region: Secondary | ICD-10-CM

## 2023-06-22 DIAGNOSIS — M5412 Radiculopathy, cervical region: Secondary | ICD-10-CM

## 2023-06-22 DIAGNOSIS — M48061 Spinal stenosis, lumbar region without neurogenic claudication: Secondary | ICD-10-CM | POA: Diagnosis not present

## 2023-06-25 DIAGNOSIS — R0609 Other forms of dyspnea: Secondary | ICD-10-CM | POA: Diagnosis not present

## 2023-06-25 DIAGNOSIS — R7303 Prediabetes: Secondary | ICD-10-CM | POA: Diagnosis not present

## 2023-06-25 DIAGNOSIS — M109 Gout, unspecified: Secondary | ICD-10-CM | POA: Diagnosis not present

## 2023-06-25 DIAGNOSIS — M797 Fibromyalgia: Secondary | ICD-10-CM | POA: Diagnosis not present

## 2023-06-25 DIAGNOSIS — E782 Mixed hyperlipidemia: Secondary | ICD-10-CM | POA: Diagnosis not present

## 2023-06-25 DIAGNOSIS — K219 Gastro-esophageal reflux disease without esophagitis: Secondary | ICD-10-CM | POA: Diagnosis not present

## 2023-06-25 DIAGNOSIS — G4700 Insomnia, unspecified: Secondary | ICD-10-CM | POA: Diagnosis not present

## 2023-06-25 DIAGNOSIS — I77810 Thoracic aortic ectasia: Secondary | ICD-10-CM | POA: Diagnosis not present

## 2023-06-25 DIAGNOSIS — D649 Anemia, unspecified: Secondary | ICD-10-CM | POA: Diagnosis not present

## 2023-06-25 DIAGNOSIS — J45909 Unspecified asthma, uncomplicated: Secondary | ICD-10-CM | POA: Diagnosis not present

## 2023-06-25 DIAGNOSIS — M542 Cervicalgia: Secondary | ICD-10-CM | POA: Diagnosis not present

## 2023-06-25 DIAGNOSIS — I10 Essential (primary) hypertension: Secondary | ICD-10-CM | POA: Diagnosis not present

## 2023-06-25 DIAGNOSIS — M5431 Sciatica, right side: Secondary | ICD-10-CM | POA: Diagnosis not present

## 2023-06-28 ENCOUNTER — Telehealth: Payer: Self-pay | Admitting: *Deleted

## 2023-06-28 NOTE — Telephone Encounter (Signed)
 Labs received from:[facility]  Drawn on:06/25/2023  Reviewed by: Dr. Nicholas Bari   Labs drawn: Uric Acid   Results: Uric Acid 4.4  Patient is on Allopurinol  300 mg 1.5 tabs po daily.

## 2023-07-04 DIAGNOSIS — M5412 Radiculopathy, cervical region: Secondary | ICD-10-CM | POA: Diagnosis not present

## 2023-07-04 DIAGNOSIS — M5416 Radiculopathy, lumbar region: Secondary | ICD-10-CM | POA: Diagnosis not present

## 2023-07-20 ENCOUNTER — Other Ambulatory Visit (HOSPITAL_COMMUNITY): Payer: Self-pay

## 2023-07-24 ENCOUNTER — Telehealth: Payer: Self-pay

## 2023-07-24 NOTE — Telephone Encounter (Signed)
   Pre-operative Risk Assessment    Patient Name: Victor [name]  DOB: [birth year] MRN: [id number]   Date of last office visit: 10/02/22 Ahmad Alert, MD Date of next office visit: NONE   Request for Surgical Clearance    Procedure:  LEFT TOTAL KNEE ARTHROPLASTY  Date of Surgery:  Clearance 10/16/23                                Surgeon:  DR Claiborne Crew Surgeon's Group or Practice Name:  [facility] Phone number:  [phone] Fax number:  [phone]  ATTN: Amanda Jungling   Type of Clearance Requested:   - Medical  - Pharmacy:  Hold [facility]ir[facility]      Type of Anesthesia:  Spinal   Additional requests/questions:    SignedCollin Deal   07/24/2023, 5:32 PM

## 2023-07-25 NOTE — Telephone Encounter (Signed)
   Name: Malcomb Gangemi Hoagland  DOB: Dec 04, 1955  MRN: [id number]  Primary Cardiologist: Ahmad Alert, MD  Chart reviewed as part of pre-operative protocol coverage. Because of Aqeel Norgaard Muench's past medical history and time since last visit, he will require a follow-up in-office visit in order to better assess preoperative cardiovascular risk.  Pre-op covering staff: - Please schedule appointment and call patient to inform them. If patient already had an upcoming appointment within acceptable timeframe, please add "pre-op clearance" to the appointment notes so provider is aware. - Please contact requesting surgeon's office via preferred method (i.e, phone, fax) to inform them of need for appointment prior to surgery.   Leala Prince, PA-C  07/25/2023, 8:56 AM

## 2023-07-25 NOTE — Telephone Encounter (Signed)
 Spoke with pt wife and scheduled IN OFFICE Preop appt. 09/25/23 with Theotis Flake, PA

## 2023-07-26 DIAGNOSIS — R3912 Poor urinary stream: Secondary | ICD-10-CM | POA: Diagnosis not present

## 2023-08-15 DIAGNOSIS — M1712 Unilateral primary osteoarthritis, left knee: Secondary | ICD-10-CM | POA: Diagnosis not present

## 2023-08-15 DIAGNOSIS — Z4789 Encounter for other orthopedic aftercare: Secondary | ICD-10-CM | POA: Diagnosis not present

## 2023-08-15 DIAGNOSIS — M7062 Trochanteric bursitis, left hip: Secondary | ICD-10-CM | POA: Diagnosis not present

## 2023-08-15 DIAGNOSIS — Z96611 Presence of right artificial shoulder joint: Secondary | ICD-10-CM | POA: Diagnosis not present

## 2023-08-17 ENCOUNTER — Telehealth: Payer: Self-pay

## 2023-08-17 NOTE — Telephone Encounter (Signed)
   Name: Victor [name]  DOB: 1955-09-02  MRN: [id number]  Primary Cardiologist: Ahmad Alert, MD  Chart reviewed as part of pre-operative protocol coverage. The patient has an upcoming visit scheduled with Reesa Cannon, PA  on 09/25/2023 at 12:15, at which time clearance can be addressed in case there are any issues that would impact surgical recommendations.  CONVERSION RIGHT ANATOMIC TO REVERSE ARTH Is not scheduled until TBD as below. I added preop FYI to appointment note so that provider is aware to address at time of outpatient visit.  Per office protocol the cardiology provider should forward their finalized clearance decision and recommendations regarding antiplatelet therapy to the requesting party below.    I will route this message as FYI to requesting party and remove this message from the preop box as separate preop APP input not needed at this time.   Please call with any questions.  Friddie Jetty, NP  08/17/2023, 8:59 AM

## 2023-08-17 NOTE — Telephone Encounter (Signed)
 Pt called and scheduled a sooner appt for preop clearance. Pt has appt 08/24/23 with Elmond Connor, NP.   I will update all parties involved of sooner appt for preop clearance.

## 2023-08-17 NOTE — Telephone Encounter (Signed)
...     Pre-operative Risk Assessment    Patient Name: Victor [name]  DOB: 1955/11/03 MRN: [id number]   Date of last office visit: 10/02/22 Date of next office visit: 09/25/23   Request for Surgical Clearance    Procedure:  CONVERSION RIGHT ANATOMIC TO REVERSE ARTH  Date of Surgery:  Clearance TBD                                Surgeon:  DR Ernestina Headland SUPPLE Surgeon's Group or Practice Name:  [facility] Phone number:  [phone] Fax number:  [phone]   Type of Clearance Requested:   - Medical  - Pharmacy:  Hold Aspirin      Type of Anesthesia:  Not Indicated   Additional requests/questions:    Montel Antu   08/17/2023, 8:43 AM

## 2023-08-18 ENCOUNTER — Other Ambulatory Visit (HOSPITAL_COMMUNITY): Payer: Self-pay

## 2023-08-21 ENCOUNTER — Other Ambulatory Visit (HOSPITAL_COMMUNITY): Payer: Self-pay

## 2023-08-23 ENCOUNTER — Other Ambulatory Visit (HOSPITAL_COMMUNITY): Payer: Self-pay

## 2023-08-23 DIAGNOSIS — M5416 Radiculopathy, lumbar region: Secondary | ICD-10-CM | POA: Diagnosis not present

## 2023-08-23 DIAGNOSIS — G894 Chronic pain syndrome: Secondary | ICD-10-CM | POA: Diagnosis not present

## 2023-08-23 DIAGNOSIS — M5412 Radiculopathy, cervical region: Secondary | ICD-10-CM | POA: Diagnosis not present

## 2023-08-23 MED ORDER — OXYCODONE HCL 10 MG PO TABS
10.0000 mg | ORAL_TABLET | Freq: Three times a day (TID) | ORAL | 0 refills | Status: DC
  Filled 2023-10-19 – 2023-10-26 (×2): qty 90, 30d supply, fill #0

## 2023-08-23 MED ORDER — OXYCODONE HCL 10 MG PO TABS
10.0000 mg | ORAL_TABLET | Freq: Three times a day (TID) | ORAL | 0 refills | Status: DC
  Filled 2023-11-26: qty 90, 30d supply, fill #0

## 2023-08-23 MED ORDER — OXYCODONE HCL 10 MG PO TABS
10.0000 mg | ORAL_TABLET | Freq: Three times a day (TID) | ORAL | 0 refills | Status: DC
  Filled 2023-09-27: qty 90, 30d supply, fill #0

## 2023-08-23 NOTE — Progress Notes (Signed)
 Cardiology Office Note    Patient Name: Victor [name] Date of Encounter: 08/23/2023  Primary Care Provider:  Seabron Lenis, MD Primary Cardiologist:  Aleene Passe, MD Primary Electrophysiologist: None   Past Medical History    Past Medical History:  Diagnosis Date   Allergic rhinitis    Anxiety    Arteriosclerosis of thoracic aorta (HCC)    Arthritis    all over (04/15/2013)   Back pain, chronic    BMI 32.0-32.9,adult    Cervicalgia    Chronic neck pain    Coronary artery disease 2023   COVID-19    DDD (degenerative disc disease), cervical    DDD (degenerative disc disease), lumbar    Diverticulosis    Elevated hemoglobin (HCC)    Fibromyalgia    GERD (gastroesophageal reflux disease)    Gout    Headache    High cholesterol    History of bronchitis    Hyperkalemia    Hypertension    Insomnia    Kidney disease with benign hypertension    Leg cramps    Right greater than left   Low testosterone     Melanoma (HCC)    Nose   MRSA infection    Neuropathy of both feet    Peripheral vascular disease (HCC)    Personal history of colonic polyps    Pre-diabetes    Radiculopathy of lumbar region    Raynaud phenomenon    Recurrent sinus infections    Right sided sciatica    Sleep apnea    don't use CPAP (04/15/2013)   Tobacco dependence     History of Present Illness  Victor [name] is a 68 y.o. male with a PMH of CAD s/p CABG x 5, HLD, former tobacco abuse, OSA (not on CPAP), ascending aortic dilation aortic atherosclerosis who presents today for preoperative clearance.  Victor [name] was referred to Dr. Passe in 2023 for coronary calcifications seen on CT.  He endorsed sinking feeling and funny feeling in his chest.  He had a cardiac PET ordered that revealed evidence of ischemia and underwent LHC on 02/03/2022 found to have severe triple-vessel CAD.  He was evaluated by CVTS and underwent CABG x 5 (LIMA to LAD, SVG to first diagonal, sequential SVG to  first and second obtuse marginals and a separate SVG to PDA) by Dr. Lucas on 05/19/2022.  He was started on amiodarone  for atrial fibrillation postprocedure which was discontinued and posthospital follow-up with Dr. Passe on 06/21/22.  He was seen for follow-up on 07/04/2022 and endorsed lightheadedness and shortness of breath. He had metoprolol  reduced to 12.5 mg twice daily. He was seen by Dr. Passe last on 10/02/2022 and was doing better with minimal soreness and was getting exercise. He was provided clearance for upcoming shoulder surgery.  Victor [name] presents today for preoperative clearance with his wife. Since his last follow-up in August 2024, he experiences occasional sensations on the left side, which he attributes to scar tissue. He notes increased shortness of breath compared to before the surgery and reports that his stamina is lower. He did not participate in cardiac rehab post-surgery due to shoulder and knee issues. He was previously on metoprolol , which was reduced to 12.5 mg twice daily after experiencing fatigue. His blood pressure at home averages around 134/86 mmHg, with occasional readings in the 140s. No palpitations or significant swelling, although he experiences some ankle swelling without compression socks. He has a history of sleep apnea but is not using CPAP  due to intolerance, which he attributes to previous neck surgeries and weight loss. He has lost significant weight, which has helped alleviate some symptoms of sleep apnea. In terms of functional capacity, he can walk one to two blocks on level ground and climb a flight of stairs, though his legs get sore. He can run short distances and perform minimal yard work, such as Engineer, water and picking up sticks. He is a member of [employer] but is limited in activity due to shoulder issues. Patient denies chest pain, palpitations, dyspnea, PND, orthopnea, nausea, vomiting, dizziness, syncope, edema, weight gain, or early  satiety.  Discussed the use of AI scribe software for clinical note transcription with the patient, who gave verbal consent to proceed.  History of Present Illness   Review of Systems  Please see the history of present illness.    All other systems reviewed and are otherwise negative except as noted above.  Physical Exam    Wt Readings from Last 3 Encounters:  03/14/23 222 lb 12.8 oz (101.1 kg)  11/30/22 222 lb (100.7 kg)  11/13/22 222 lb (100.7 kg)   CD:Uyzmz were no vitals filed for this visit.,There is no height or weight on file to calculate BMI. GEN: Well nourished, well developed in no acute distress Neck: No JVD; No carotid bruits Pulmonary: Clear to auscultation without rales, wheezing or rhonchi  Cardiovascular: Normal rate. Regular rhythm. Normal S1. Normal S2.   Murmurs: There is no murmur.  ABDOMEN: Soft, non-tender, non-distended EXTREMITIES:  No edema; No deformity   EKG/LABS/ Recent Cardiac Studies   ECG personally reviewed by me today -sinus rhythm at 67 bpm with first-degree AVB no atrial fibrillation consistent with previous EKG  Risk Assessment/Calculations:          Lab Results  Component Value Date   WBC 10.8 03/14/2023   HGB 12.5 (L) 03/14/2023   HCT 39.4 03/14/2023   MCV 79.1 (L) 03/14/2023   PLT 387 03/14/2023   Lab Results  Component Value Date   CREATININE 0.98 03/14/2023   BUN 23 03/14/2023   NA 135 03/14/2023   K 5.2 03/14/2023   CL 101 03/14/2023   CO2 26 03/14/2023   Lab Results  Component Value Date   CHOL 120 10/02/2022   HDL 42 10/02/2022   LDLCALC 59 10/02/2022   TRIG 101 10/02/2022   CHOLHDL 2.9 10/02/2022    Lab Results  Component Value Date   HGBA1C 6.0 (H) 05/17/2022   Assessment & Plan    Assessment & Plan  1.  Preoperative clearance: - Patient's RCRI score is 6.6%  The patient affirms he has been doing well without any new cardiac symptoms. They are able to achieve 6 METS without cardiac limitations.  Therefore, based on ACC/AHA guidelines, the patient would be at acceptable risk for the planned procedure without further cardiovascular testing. The patient was advised that if he develops new symptoms prior to surgery to contact our office to arrange for a follow-up visit, and he verbalized understanding.   Patient can hold ASA 325 mg 5 to 7 days prior to procedure and should restart postprocedure when surgically safe and hemostasis is achieved.  2.  History of CAD: -s/p CABG x 5 (LIMA to LAD, SVG to first diagonal, sequential SVG to first and second obtuse marginals and a separate SVG to PDA) by Dr. Lucas on 05/19/2022 - Patient reports no chest pain or angina since previous follow-up. - Continue current GDMT with ASA 325 mg, Repatha  140  mg/mL, metoprolol  12.5 mg twice daily, as needed Nitrostat  0.4 mg  3.  Essential hypertension: - Patient's blood pressure today is stable at 136/88 - Continue Norvasc 5 mg daily and metoprolol  tartrate 12.5 mg twice daily  4.  Hyperlipidemia: - Patient's last LDL cholesterol was stable at 24 - Continue Repatha  140 mg q. 14 days  5. Sleep apnea: Intolerance to CPAP due to neck surgeries. Significant weight loss has improved symptoms.     Disposition: Follow-up with Dr. Vernice or APP in 6 months    Signed, Wyn Raddle, Jackee Shove, NP 08/23/2023, 5:58 PM [facility]

## 2023-08-24 ENCOUNTER — Encounter: Payer: Self-pay | Admitting: Nurse Practitioner

## 2023-08-24 ENCOUNTER — Ambulatory Visit: Attending: Nurse Practitioner | Admitting: Nurse Practitioner

## 2023-08-24 VITALS — BP 136/88 | HR 67 | Ht 67.0 in | Wt 226.0 lb

## 2023-08-24 DIAGNOSIS — E782 Mixed hyperlipidemia: Secondary | ICD-10-CM

## 2023-08-24 DIAGNOSIS — Z01810 Encounter for preprocedural cardiovascular examination: Secondary | ICD-10-CM | POA: Diagnosis not present

## 2023-08-24 DIAGNOSIS — I25119 Atherosclerotic heart disease of native coronary artery with unspecified angina pectoris: Secondary | ICD-10-CM | POA: Diagnosis not present

## 2023-08-24 DIAGNOSIS — G4733 Obstructive sleep apnea (adult) (pediatric): Secondary | ICD-10-CM

## 2023-08-24 DIAGNOSIS — I10 Essential (primary) hypertension: Secondary | ICD-10-CM

## 2023-08-24 NOTE — Patient Instructions (Signed)
 Medication Instructions:  Your physician recommends that you continue on your current medications as directed. Please refer to the Current Medication list given to you today. *If you need a refill on your cardiac medications before your next appointment, please call your pharmacy*  Lab Work: None ordered If you have labs (blood work) drawn today and your tests are completely normal, you will receive your results only by: MyChart Message (if you have MyChart) OR A paper copy in the mail If you have any lab test that is abnormal or we need to change your treatment, we will call you to review the results.  Testing/Procedures: None ordered  Follow-Up: At [facility], you and your health needs are our priority.  As part of our continuing mission to provide you with exceptional heart care, our providers are all part of one team.  This team includes your primary Cardiologist (physician) and Advanced Practice Providers or APPs (Physician Assistants and Nurse Practitioners) who all work together to provide you with the care you need, when you need it.  Your next appointment:   6 month(s)  Provider:   Darryle Decent, MD  We recommend signing up for the patient portal called MyChart.  Sign up information is provided on this After Visit Summary.  MyChart is used to connect with patients for Virtual Visits (Telemedicine).  Patients are able to view lab/test results, encounter notes, upcoming appointments, etc.  Non-urgent messages can be sent to your provider as well.   To learn more about what you can do with MyChart, go to [email or web address].   Other Instructions YOU ARE CLEARED FOR YOUR PROCEDURE

## 2023-08-29 NOTE — Progress Notes (Signed)
 Office Visit Note  Patient: Victor [name]             Date of Birth: 14-Dec-1955           MRN: [id number]             PCP: Seabron Lenis, MD Referring: Seabron Lenis, MD Visit Date: 09/12/2023 Occupation: @GUAROCC @  Subjective:  Pain in right foot   History of Present Illness: Victor [name] is a 68 y.o. male with gout and osteoarthritis.  He returns today after his last visit in January 2025.  He takes allopurinol  450 mg p.o. daily and colchicine  0.6 mg p.o. twice daily as needed.  At the last visit he was having a flare of gout in his left hand for which she started taking colchicine  and requested a prednisone  taper which was given.  He has been having increased discomfort in his right replaced shoulder.  He states he will need a revision by Dr. Melita next week.  He continues to have some discomfort in the left upper shoulder.  He continues to have some bilateral trochanteric bursitis.  He has been having cortisone injection to the left trochanteric bursa.  He states he will require left total knee replacement in the future.  Right total knee replacement is doing well.  He complains of discomfort in the right 2nd, 3rd and 4th toe.  History of discomfort in the neck and lower back.  He continues to have generalized pain and discomfort from fibromyalgia.    Activities of Daily Living:  Patient reports morning stiffness for 5-10 minutes.   Patient Reports nocturnal pain.  Difficulty dressing/grooming: Reports Difficulty climbing stairs: Reports Difficulty getting out of chair: Denies Difficulty using hands for taps, buttons, cutlery, and/or writing: Reports  Review of Systems  Constitutional:  Negative for fatigue.  HENT:  Positive for mouth dryness. Negative for mouth sores.   Eyes:  Positive for dryness.  Respiratory:  Negative for shortness of breath.   Cardiovascular:  Negative for chest pain and palpitations.  Gastrointestinal:  Positive for constipation.  Negative for blood in stool and diarrhea.  Endocrine: Negative for increased urination.  Genitourinary:  Negative for involuntary urination.  Musculoskeletal:  Positive for joint pain, joint pain, myalgias, muscle weakness, morning stiffness and myalgias. Negative for gait problem, joint swelling and muscle tenderness.  Skin:  Negative for color change, rash, hair loss and sensitivity to sunlight.  Allergic/Immunologic: Negative for susceptible to infections.  Neurological:  Positive for headaches. Negative for dizziness.  Hematological:  Negative for swollen glands.  Psychiatric/Behavioral:  Positive for sleep disturbance. Negative for depressed mood. The patient is nervous/anxious.     PMFS History:  Patient Active Problem List   Diagnosis Date Noted   S/P reverse total shoulder arthroplasty, left 11/30/2022   S/P CABG x 5 05/19/2022   Coronary artery disease involving native coronary artery of native heart with angina pectoris (HCC) 02/03/2022   Degenerative spondylolisthesis 05/31/2021   Cervical radiculopathy 05/24/2020   Hypertension    Neuropathy of both feet    Lumbar foraminal stenosis 08/06/2017   HNP (herniated nucleus pulposus), thoracic 09/05/2016   Chronic kidney disease (CKD) 04/26/2016   DJD (degenerative joint disease), cervical 04/26/2016   Essential hypertension 04/26/2016   Hyperlipidemia 04/26/2016   Sleep apnea 04/26/2016   Fibromyalgia 04/18/2016   Idiopathic chronic gout of multiple sites without tophus 04/18/2016   Spondylosis of lumbar region without myelopathy or radiculopathy 04/18/2016   Primary osteoarthritis of both knees 04/18/2016  Lumbar herniated disc 07/22/2015   Morbid obesity (HCC) 02/25/2014   S/P right TKA 02/24/2014   Heartburn 01/10/2011    Past Medical History:  Diagnosis Date   Allergic rhinitis    Anxiety    Arteriosclerosis of thoracic aorta (HCC)    Arthritis    all over (04/15/2013)   Back pain, chronic    BMI  32.0-32.9,adult    Cervicalgia    Chronic neck pain    Coronary artery disease 2023   COVID-19    DDD (degenerative disc disease), cervical    DDD (degenerative disc disease), lumbar    Diverticulosis    Elevated hemoglobin (HCC)    Fibromyalgia    GERD (gastroesophageal reflux disease)    Gout    Headache    High cholesterol    History of bronchitis    Hyperkalemia    Hypertension    Insomnia    Kidney disease with benign hypertension    Leg cramps    Right greater than left   Low testosterone     Melanoma (HCC)    Nose   MRSA infection    Neuropathy of both feet    Peripheral vascular disease (HCC)    Personal history of colonic polyps    Pre-diabetes    Radiculopathy of lumbar region    Raynaud phenomenon    Recurrent sinus infections    Right sided sciatica    Sinus infection 09/05/2023   Sleep apnea    don't use CPAP (04/15/2013)   Tobacco dependence     Family History  Problem Relation Age of Onset   Hypertension Father    Cancer Mother        breast    Heart Problems Mother    Alzheimer's disease Mother    Allergies Daughter    Interstitial cystitis Daughter    Past Surgical History:  Procedure Laterality Date   ANTERIOR CERVICAL DECOMP/DISCECTOMY FUSION  2000's X3   had 3 surgeries on my front neck (04/15/2013)   ANTERIOR LAT LUMBAR FUSION Left 08/06/2017   Procedure: LEFT LUMBAR TWO-THREE ANTERIOR LATERAL LUMBAR FUSION WITH PERCUTANEOUS SCREWS;  Surgeon: Louis Shove, MD;  Location: [facility] OR;  Service: Neurosurgery;  Laterality: Left;   APPENDECTOMY  ~ 2010   BACK SURGERY  05/31/2021   Lumbar fusion L4-L5 L5-S1 with Dr. Louis CARRY TUNNEL RELEASE Left 1990's   CATARACT EXTRACTION, BILATERAL     COLONOSCOPY     CORONARY ARTERY BYPASS GRAFT N/A 05/19/2022   Procedure: CORONARY ARTERY BYPASS GRAFTING (CABG) X FIVE BYPASSES USING OPEN LEFT INTERNAL MAMMARY ARTERY AND ENDOSCOPIC LEFT GREATER SAPHENOUS VEIN HARVEST;  Surgeon: Lucas Dorise POUR, MD;   Location: [facility] OR;  Service: Open Heart Surgery;  Laterality: N/A;   CORONARY PRESSURE/FFR STUDY N/A 02/03/2022   Procedure: INTRAVASCULAR PRESSURE WIRE/FFR STUDY;  Surgeon: Verlin Lonni BIRCH, MD;  Location: [facility];  Service: Cardiovascular;  Laterality: N/A;   ear drum surgery  1974   EYE SURGERY Right    FLEXIBLE SIGMOIDOSCOPY  12/21/2011   Procedure: FLEXIBLE SIGMOIDOSCOPY;  Surgeon: Jerrell KYM Sol, MD;  Location: [facility];  Service: Endoscopy;  Laterality: N/A;   HAND TENDON SURGERY Left ~ 1978 X2-1980's X3   had 5 ORs after laceration   HOT HEMOSTASIS  12/21/2011   Procedure: HOT HEMOSTASIS (ARGON PLASMA COAGULATION/BICAP);  Surgeon: Jerrell KYM Sol, MD;  Location: [facility];  Service: Endoscopy;  Laterality: N/A;   INGUINAL HERNIA REPAIR Left 04/15/2013   Procedure: LAPAROSCOPIC  REPAIR OF INCARCERATED LEFT INGUINAL HERNIA;  Surgeon: Camellia CHRISTELLA Blush, MD;  Location: [facility] OR;  Service: General;  Laterality: Left;   INGUINAL HERNIA REPAIR Bilateral 1970's - 1984   INGUINAL HERNIA REPAIR Left 04/15/2013   INSERTION OF MESH Left 04/15/2013   Procedure: INSERTION OF MESH;  Surgeon: Camellia CHRISTELLA Blush, MD;  Location: [facility] OR;  Service: General;  Laterality: Left;   JOINT REPLACEMENT     right knee   KNEE ARTHROSCOPY Right 1990's   LAPAROSCOPY N/A 04/15/2013   Procedure: LAPAROSCOPY DIAGNOSTIC;  Surgeon: Camellia CHRISTELLA Blush, MD;  Location: [facility] OR;  Service: General;  Laterality: N/A;   LEFT HEART CATH AND CORONARY ANGIOGRAPHY N/A 02/03/2022   Procedure: LEFT HEART CATH AND CORONARY ANGIOGRAPHY;  Surgeon: Verlin Lonni BIRCH, MD;  Location: [facility];  Service: Cardiovascular;  Laterality: N/A;   LUMBAR LAMINECTOMY/DECOMPRESSION MICRODISCECTOMY Right 07/22/2015   Procedure: Right Lumbar Two-Three Microdiscectomy;  Surgeon: Victory Gunnels, MD;  Location: [facility];  Service: Neurosurgery;  Laterality: Right;   LUMBAR LAMINECTOMY/DECOMPRESSION MICRODISCECTOMY Right  09/05/2016   Procedure: Microdiscectomy - right - T12-L1;  Surgeon: Gunnels Victory, MD;  Location: [facility] OR;  Service: Neurosurgery;  Laterality: Right;   NASAL SEPTUM SURGERY Bilateral 2014   removed polyps & infection   POLYPECTOMY  01/10/2011   Procedure: POLYPECTOMY;  Surgeon: Jerrell KYM Sol, MD;  Location: [facility];  Service: Endoscopy;  Laterality: N/A;   POSTERIOR CERVICAL LAMINECTOMY Left 05/24/2020   Procedure: Laminectomy and Foraminotomy - left - Cervical two-Cervical three;  Surgeon: Gunnels Victory, MD;  Location: [facility];  Service: Neurosurgery;  Laterality: Left;   POSTERIOR LUMBAR FUSION  11/1995   ray cages (04/15/2013)   SHOULDER ARTHROSCOPY Bilateral    twice each   TEE WITHOUT CARDIOVERSION N/A 05/19/2022   Procedure: TRANSESOPHAGEAL ECHOCARDIOGRAM;  Surgeon: Lucas Dorise POUR, MD;  Location: [facility] OR;  Service: Open Heart Surgery;  Laterality: N/A;   TONSILLECTOMY  1960's   TOTAL KNEE ARTHROPLASTY Right 02/24/2014   Procedure: TOTAL RIGHT KNEE ARTHROPLASTY;  Surgeon: Donnice BIRCH Car, MD;  Location: [facility];  Service: Orthopedics;  Laterality: Right;   TOTAL SHOULDER ARTHROPLASTY Left 09/18/2019   Procedure: TOTAL SHOULDER ARTHROPLASTY;  Surgeon: Melita Drivers, MD;  Location: [facility];  Service: Orthopedics;  Laterality: Left;    TOTAL SHOULDER ARTHROPLASTY Right 11/04/2020   Procedure: TOTAL SHOULDER ARTHROPLASTY;  Surgeon: Melita Drivers, MD;  Location: [facility];  Service: Orthopedics;  Laterality: Right;    TOTAL SHOULDER REVISION Left 11/30/2022   Procedure: Removal Left anatomic shoulder arthroplasty and conversion to Reverse shoulder arthroplasty;  Surgeon: Melita Drivers, MD;  Location: [facility];  Service: Orthopedics;  Laterality: Left;    UPPER GI ENDOSCOPY     VASECTOMY  1988   WISDOM TOOTH EXTRACTION  2000's   Social History   Social History Narrative   Not on file   Immunization History  Administered Date(s) Administered   Fluzone Influenza  virus vaccine,trivalent (IIV3), split virus 02/06/2012, 02/14/2013, 12/07/2014, 11/12/2018   Influenza Whole 11/08/2011   Influenza,inj,Quad PF,6+ Mos 03/20/2018   Influenza,inj,quad, With Preservative 11/28/2018   Influenza-Unspecified 11/10/2010, 02/27/2013, 12/10/2020, 12/11/2021, 01/04/2023   PFIZER(Purple Top)SARS-COV-2 Vaccination 06/23/2019, 07/14/2019, 05/14/2020, 09/22/2020   PNEUMOCOCCAL CONJUGATE-20 12/09/2021   Pfizer Covid-19 Vaccine Bivalent Booster 86yrs & up 01/14/2021   Pfizer(Comirnaty)Fall Seasonal Vaccine 12 years and older 12/18/2021   Pneumococcal Conjugate-13 09/28/2020   Pneumococcal Polysaccharide-23 06/12/2011   Tdap 12/06/2010, 12/22/2022   Zoster Recombinant(Shingrix) 02/13/2018  Zoster, Live 06/28/2010, 02/13/2018, 10/31/2018     Objective: Vital Signs: BP 132/80 (BP Location: Left Arm, Patient Position: Sitting, Cuff Size: Normal)   Pulse 75   Resp 16   Ht 5' 7 (1.702 m)   Wt 222 lb 12.8 oz (101.1 kg)   BMI 34.90 kg/m    Physical Exam Vitals and nursing note reviewed.  Constitutional:      Appearance: He is well-developed.  HENT:     Head: Normocephalic and atraumatic.  Eyes:     Conjunctiva/sclera: Conjunctivae normal.     Pupils: Pupils are equal, round, and reactive to light.  Cardiovascular:     Rate and Rhythm: Normal rate and regular rhythm.     Heart sounds: Normal heart sounds.  Pulmonary:     Effort: Pulmonary effort is normal.     Breath sounds: Normal breath sounds.  Abdominal:     General: Bowel sounds are normal.     Palpations: Abdomen is soft.  Musculoskeletal:     Cervical back: Normal range of motion and neck supple.  Skin:    General: Skin is warm and dry.     Capillary Refill: Capillary refill takes less than 2 seconds.  Neurological:     Mental Status: He is alert and oriented to person, place, and time.  Psychiatric:        Behavior: Behavior normal.      Musculoskeletal Exam: He had limited range of motion of  the cervical spine with flexion extension and lateral rotation.  Thoracic and lumbar spine were difficult to assess in the seated position.  He had painful range of motion of bilateral shoulders.  Both shoulder joints were replaced.  He had limited internal rotation of bilateral shoulders.  Elbow joints and wrist joints in good range of motion.  Had bilateral CMC PIP and DIP thickening with no synovitis.  Hip joints could not be assessed in the seated position.  Knee joints were replaced and were in good range of motion.  There was no tenderness over ankles or MTPs.  He had tenderness at the base of the 2nd, 3rd and 4th MTPs.  CDAI Exam: CDAI Score: -- Patient Global: --; Provider Global: -- Swollen: --; Tender: -- Joint Exam 09/12/2023   No joint exam has been documented for this visit   There is currently no information documented on the homunculus. Go to the Rheumatology activity and complete the homunculus joint exam.  Investigation: No additional findings.  Imaging: No results found.  Recent Labs: Lab Results  Component Value Date   WBC 10.8 03/14/2023   HGB 12.5 (L) 03/14/2023   PLT 387 03/14/2023   NA 135 03/14/2023   K 5.2 03/14/2023   CL 101 03/14/2023   CO2 26 03/14/2023   GLUCOSE 111 03/14/2023   BUN 23 03/14/2023   CREATININE 0.98 03/14/2023   BILITOT 0.5 03/14/2023   ALKPHOS 73 08/02/2022   AST 24 03/14/2023   ALT 21 03/14/2023   PROT 7.3 03/14/2023   ALBUMIN  4.4 08/02/2022   CALCIUM  9.2 03/14/2023   GFRAA >60 09/09/2019   June 25, 2023 uric acid 4.4 at [facility] internal medicine  Speciality Comments: No specialty comments available.  Procedures:  No procedures performed Allergies: Tetracyclines & related, Amoxicillin, Crestor  [rosuvastatin ], Doxycycline, and Niacin and related   Assessment / Plan:     Visit Diagnoses: Idiopathic chronic gout of multiple sites without tophus - Allopurinol  450 mg p.o. daily.  Colchicine  0.6 mg p.o. twice daily as needed.  Prednisone  taper given on 03/14/23 for a flare.  Uric acid 3.9 on 01/ 15/25.  Patient states he had a good response to prednisone  taper.  He has had no recurrence of gout flares since then.  Medication monitoring encounter-CBC and CMP were normal in January.  Patient will be getting labs again before the surgery for right shoulder replacement.  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 Melita - Limited internal rotation.  Patient states he is scheduled to have right total shoulder replacement next week by Dr. Melita.  Status post shoulder replacement, left, 09/18/19 by Dr. Melita - Some limitation with internal rotation.  He had painful range of motion.  Primary osteoarthritis of both hands - Bilateral PIP and DIP thickening.  He continues to have discomfort in his hands.  He has bilateral CMC PIP and DIP thickening.  Iliotibial band syndrome of left side-patient states he has been getting cortisone injections at orthopedics.  Primary osteoarthritis of left knee-he has been experiencing discomfort in his left knee.  No warmth swelling or effusion was noted.  History of total right knee replacement (TKR)-doing well.  Primary osteoarthritis of both feet -he complains of discomfort at the base of the 2nd, 3rd and 4th toe.  No synovitis was noted.  Plan: XR Foot 2 Views Right.  X-rays of the suggestive of osteoarthritis.  X-ray findings were reviewed with the patient.  No erosive changes were noted.  His clinical findings consistent with Morton's neuroma.  Detailed counseling was provided.  Information was given.  If symptoms persist we can consider cortisone injection in the future.  DDD (degenerative disc disease), cervical-history with stiffness discomfort and limited range of motion.  He was recently evaluated by Dr. Viviane and has a follow-up appointment.  Spondylosis of lumbar region without myelopathy or radiculopathy, s/p fusion by Dr Malcolm 04/23  Fibromyalgia-he continues to  have generalized pain and discomfort from fibromyalgia.  Other medical problems are listed as follows:  S/P CABG x 5, 04/2022.  History of hypertension  History of hypercholesterolemia  History of gastroesophageal reflux (GERD)  History of hiatal hernia  History of insomnia  History of sleep apnea  Orders: Orders Placed This Encounter  Procedures   XR Foot 2 Views Right   No orders of the defined types were placed in this encounter.    Follow-Up Instructions: Return in about 6 months (around 03/14/2024) for Gout, Osteoarthritis.   Maya Nash, MD  Note - This record has been created using Animal nutritionist.  Chart creation errors have been sought, but may not always  have been located. Such creation errors do not reflect on  the standard of medical care.

## 2023-09-04 DIAGNOSIS — J329 Chronic sinusitis, unspecified: Secondary | ICD-10-CM | POA: Diagnosis not present

## 2023-09-04 DIAGNOSIS — J309 Allergic rhinitis, unspecified: Secondary | ICD-10-CM | POA: Diagnosis not present

## 2023-09-05 DIAGNOSIS — J329 Chronic sinusitis, unspecified: Secondary | ICD-10-CM

## 2023-09-05 HISTORY — DX: Chronic sinusitis, unspecified: J32.9

## 2023-09-07 ENCOUNTER — Other Ambulatory Visit: Payer: Self-pay | Admitting: Physician Assistant

## 2023-09-07 DIAGNOSIS — M1A09X0 Idiopathic chronic gout, multiple sites, without tophus (tophi): Secondary | ICD-10-CM

## 2023-09-07 NOTE — Telephone Encounter (Signed)
 Last Fill: 06/11/2023  Labs: 03/14/2023 Hemoglobin is low and stable, uric acid 3.9 in the desirable range, CMP normal.  06/25/2023  Uric Acid 4.4   Next Visit: 09/12/2023  Last Visit: 03/14/2023  DX: Idiopathic chronic gout of multiple sites without tophus   Current Dose per office note 03/14/2023: allopurinol  450 mg po daily   Patient to update labs at upcoming appointment on 09/12/2023.   Okay to refill Allopurinol ?

## 2023-09-12 ENCOUNTER — Ambulatory Visit: Payer: Medicare Other | Attending: Rheumatology | Admitting: Rheumatology

## 2023-09-12 ENCOUNTER — Encounter: Payer: Self-pay | Admitting: Rheumatology

## 2023-09-12 ENCOUNTER — Ambulatory Visit (INDEPENDENT_AMBULATORY_CARE_PROVIDER_SITE_OTHER)

## 2023-09-12 VITALS — BP 132/80 | HR 75 | Resp 16 | Ht 67.0 in | Wt 222.8 lb

## 2023-09-12 DIAGNOSIS — Z96651 Presence of right artificial knee joint: Secondary | ICD-10-CM | POA: Diagnosis not present

## 2023-09-12 DIAGNOSIS — Z96612 Presence of left artificial shoulder joint: Secondary | ICD-10-CM | POA: Diagnosis not present

## 2023-09-12 DIAGNOSIS — M1A09X0 Idiopathic chronic gout, multiple sites, without tophus (tophi): Secondary | ICD-10-CM

## 2023-09-12 DIAGNOSIS — M19041 Primary osteoarthritis, right hand: Secondary | ICD-10-CM | POA: Diagnosis not present

## 2023-09-12 DIAGNOSIS — Z8679 Personal history of other diseases of the circulatory system: Secondary | ICD-10-CM

## 2023-09-12 DIAGNOSIS — M1712 Unilateral primary osteoarthritis, left knee: Secondary | ICD-10-CM | POA: Diagnosis not present

## 2023-09-12 DIAGNOSIS — M19071 Primary osteoarthritis, right ankle and foot: Secondary | ICD-10-CM

## 2023-09-12 DIAGNOSIS — M797 Fibromyalgia: Secondary | ICD-10-CM

## 2023-09-12 DIAGNOSIS — Z5181 Encounter for therapeutic drug level monitoring: Secondary | ICD-10-CM | POA: Diagnosis not present

## 2023-09-12 DIAGNOSIS — M7632 Iliotibial band syndrome, left leg: Secondary | ICD-10-CM

## 2023-09-12 DIAGNOSIS — M19042 Primary osteoarthritis, left hand: Secondary | ICD-10-CM

## 2023-09-12 DIAGNOSIS — M47816 Spondylosis without myelopathy or radiculopathy, lumbar region: Secondary | ICD-10-CM | POA: Diagnosis not present

## 2023-09-12 DIAGNOSIS — Z87898 Personal history of other specified conditions: Secondary | ICD-10-CM

## 2023-09-12 DIAGNOSIS — Z951 Presence of aortocoronary bypass graft: Secondary | ICD-10-CM

## 2023-09-12 DIAGNOSIS — Z8719 Personal history of other diseases of the digestive system: Secondary | ICD-10-CM

## 2023-09-12 DIAGNOSIS — Z96611 Presence of right artificial shoulder joint: Secondary | ICD-10-CM | POA: Diagnosis not present

## 2023-09-12 DIAGNOSIS — Z8639 Personal history of other endocrine, nutritional and metabolic disease: Secondary | ICD-10-CM

## 2023-09-12 DIAGNOSIS — M5030 Other cervical disc degeneration, unspecified cervical region: Secondary | ICD-10-CM

## 2023-09-12 DIAGNOSIS — M19072 Primary osteoarthritis, left ankle and foot: Secondary | ICD-10-CM

## 2023-09-12 DIAGNOSIS — Z8669 Personal history of other diseases of the nervous system and sense organs: Secondary | ICD-10-CM

## 2023-09-12 NOTE — Patient Instructions (Signed)
 SURGICAL WAITING ROOM VISITATION  Patients having surgery or a procedure may have no more than 2 support people in the waiting area - these visitors may rotate.    Children under the age of 83 must have an adult with them who is not the patient.  Visitors with respiratory illnesses are discouraged from visiting and should remain at home.  If the patient needs to stay at the hospital during part of their recovery, the visitor guidelines for inpatient rooms apply. Pre-op nurse will coordinate an appropriate time for 1 support person to accompany patient in pre-op.  This support person may not rotate.    Please refer to the [facility] website for the visitor guidelines for Inpatients (after your surgery is over and you are in a regular room).    Your procedure is scheduled on: 09/20/23   Report to [facility] Main Entrance    Report to admitting at 7:25 AM   Call this number if you have problems the morning of surgery [phone]   Do not eat food :After Midnight.   After Midnight you may have the following liquids until 6:55 AM DAY OF SURGERY  Water  Non-Citrus Juices (without pulp, NO RED-Apple, White grape, White cranberry) Black Coffee (NO MILK/CREAM OR CREAMERS, sugar ok)  Clear Tea (NO MILK/CREAM OR CREAMERS, sugar ok) regular and decaf                             Plain Jell-O (NO RED)                                           Fruit ices (not with fruit pulp, NO RED)                                     Popsicles (NO RED)                                                               Sports drinks like Gatorade (NO RED)     The day of surgery:  Drink ONE (1) Pre-Surgery G2 at 6:55 AM the morning of surgery. Drink in one sitting. Do not sip.  This drink was given to you during your hospital  pre-op appointment visit. Nothing else to drink after completing the  Pre-Surgery G2.          If you have questions, please contact your surgeon's office.   FOLLOW BOWEL PREP  AND ANY ADDITIONAL PRE OP INSTRUCTIONS YOU RECEIVED FROM YOUR SURGEON'S OFFICE!!!     Oral Hygiene is also important to reduce your risk of infection.                                    Remember - BRUSH YOUR TEETH THE MORNING OF SURGERY WITH YOUR REGULAR TOOTHPASTE  DENTURES WILL BE REMOVED PRIOR TO SURGERY PLEASE DO NOT APPLY Poly grip OR ADHESIVES!!!   Stop all vitamins and herbal supplements 7 days before surgery.  Take these medicines the morning of surgery with A SIP OF WATER : Tylenol , Allopurinol , Alprazolam , Amlodipine, Metoprolol , Omeprazole, Oxycodone , Venlafaxine                                You may not have any metal on your body including jewelry, and body piercing             Do not wear lotions, powders, cologne, or deodorant              Men may shave face and neck.   Do not bring valuables to the hospital. [facility] IS NOT             RESPONSIBLE   FOR VALUABLES.   Contacts, glasses, dentures or bridgework may not be worn into surgery.   Bring small overnight bag day of surgery.   DO NOT BRING YOUR HOME MEDICATIONS TO THE HOSPITAL. PHARMACY WILL DISPENSE MEDICATIONS LISTED ON YOUR MEDICATION LIST TO YOU DURING YOUR ADMISSION IN THE HOSPITAL!              Please read over the following fact sheets you were given: IF YOU HAVE QUESTIONS ABOUT YOUR PRE-OP INSTRUCTIONS PLEASE CALL [phone]   If you received a COVID test during your pre-op visit  it is requested that you wear a mask when out in public, stay away from anyone that may not be feeling well and notify your surgeon if you develop symptoms. If you test positive for Covid or have been in contact with anyone that has tested positive in the last 10 days please notify you surgeon.      Pre-operative 5 CHG Bath Instructions   You can play a key role in reducing the risk of infection after surgery. Your skin needs to be as free of germs as possible. You can reduce the number of germs on your skin  by washing with CHG (chlorhexidine  gluconate) soap before surgery. CHG is an antiseptic soap that kills germs and continues to kill germs even after washing.   DO NOT use if you have an allergy to chlorhexidine /CHG or antibacterial soaps. If your skin becomes reddened or irritated, stop using the CHG and notify one of our RNs at [phone].   Please shower with the CHG soap starting 4 days before surgery using the following schedule:     Please keep in mind the following:  DO NOT shave, including legs and underarms, starting the day of your first shower.   You may shave your face at any point before/day of surgery.  Place clean sheets on your bed the day you start using CHG soap. Use a clean washcloth (not used since being washed) for each shower. DO NOT sleep with pets once you start using the CHG.   CHG Shower Instructions:  If you choose to wash your hair and private area, wash first with your normal shampoo/soap.  After you use shampoo/soap, rinse your hair and body thoroughly to remove shampoo/soap residue.  Turn the water  OFF and apply about 3 tablespoons (45 ml) of CHG soap to a CLEAN washcloth.  Apply CHG soap ONLY FROM YOUR NECK DOWN TO YOUR TOES (washing for 3-5 minutes)  DO NOT use CHG soap on face, private areas, open wounds, or sores.  Pay special attention to the area where your surgery is being performed.  If you are having back surgery, having someone wash your back for you may  be helpful. Wait 2 minutes after CHG soap is applied, then you may rinse off the CHG soap.  Pat dry with a clean towel  Put on clean clothes/pajamas   If you choose to wear lotion, please use ONLY the CHG-compatible lotions on the back of this paper.     Additional instructions for the day of surgery: DO NOT APPLY any lotions, deodorants, cologne, or perfumes.   Put on clean/comfortable clothes.  Brush your teeth.  Ask your nurse before applying any prescription medications to the  skin.      CHG Compatible Lotions   Aveeno Moisturizing lotion  Cetaphil Moisturizing Cream  Cetaphil Moisturizing Lotion  Clairol Herbal Essence Moisturizing Lotion, Dry Skin  Clairol Herbal Essence Moisturizing Lotion, Extra Dry Skin  Clairol Herbal Essence Moisturizing Lotion, Normal Skin  Curel Age Defying Therapeutic Moisturizing Lotion with Alpha Hydroxy  Curel Extreme Care Body Lotion  Curel Soothing Hands Moisturizing Hand Lotion  Curel Therapeutic Moisturizing Cream, Fragrance-Free  Curel Therapeutic Moisturizing Lotion, Fragrance-Free  Curel Therapeutic Moisturizing Lotion, Original Formula  Eucerin Daily Replenishing Lotion  Eucerin Dry Skin Therapy Plus Alpha Hydroxy Crme  Eucerin Dry Skin Therapy Plus Alpha Hydroxy Lotion  Eucerin Original Crme  Eucerin Original Lotion  Eucerin Plus Crme Eucerin Plus Lotion  Eucerin TriLipid Replenishing Lotion  Keri Anti-Bacterial Hand Lotion  Keri Deep Conditioning Original Lotion Dry Skin Formula Softly Scented  Keri Deep Conditioning Original Lotion, Fragrance Free Sensitive Skin Formula  Keri Lotion Fast Absorbing Fragrance Free Sensitive Skin Formula  Keri Lotion Fast Absorbing Softly Scented Dry Skin Formula  Keri Original Lotion  Keri Skin Renewal Lotion Keri Silky Smooth Lotion  Keri Silky Smooth Sensitive Skin Lotion  Nivea Body Creamy Conditioning Oil  Nivea Body Extra Enriched Lotion  Nivea Body Original Lotion  Nivea Body Sheer Moisturizing Lotion Nivea Crme  Nivea Skin Firming Lotion  NutraDerm 30 Skin Lotion  NutraDerm Skin Lotion  NutraDerm Therapeutic Skin Cream  NutraDerm Therapeutic Skin Lotion  ProShield Protective Hand Cream  Provon moisturizing lotion View Pre-Surgery Education Videos:  [email or web address]    - Preparing for Total Shoulder Arthroplasty    Before surgery, you can play an important role. Because skin is not  sterile, your skin needs to be as free of germs as possible. You can reduce the number of germs on your skin by using the following products. Benzoyl Peroxide Gel Reduces the number of germs present on the skin Applied twice a day to shoulder area starting two days before surgery    ==================================================================  Please follow these instructions carefully:  BENZOYL PEROXIDE 5% GEL  Please do not use if you have an allergy to benzoyl peroxide.   If your skin becomes reddened/irritated stop using the benzoyl peroxide.  Starting two days before surgery, apply as follows: Apply benzoyl peroxide in the morning and at night. Apply after taking a shower. If you are not taking a shower clean entire shoulder front, back, and side along with the armpit with a clean wet washcloth.  Place a quarter-sized dollop on your shoulder and rub in thoroughly, making sure to cover the front, back, and side of your shoulder, along with the armpit.   2 days before ____ AM   ____ PM              1 day before ____ AM   ____ PM  Do this twice a day for two days.  (Last application is the night before surgery, AFTER using the CHG soap as described below).  Do NOT apply benzoyl peroxide gel on the day of surgery.

## 2023-09-12 NOTE — Progress Notes (Signed)
 COVID Vaccine Completed: yes  Date of COVID positive in last 90 days:  PCP - Alm Rav, MD Cardiologist - Aleene Finely, MD  Cardiac clearance in media tab dated 08/27/23  Chest x-ray -  EKG - 08/24/23 Epic Stress Test -  ECHO - 05/19/22 Epic Cardiac Cath - 02/03/22 Epic Pacemaker/ICD device last checked: Spinal Cord Stimulator:  Bowel Prep -   Sleep Study -  CPAP -   Fasting Blood Sugar - pre DM Checks Blood Sugar _____ times a day  Last dose of GLP1 agonist-  N/A GLP1 instructions:  Do not take after     Last dose of SGLT-2 inhibitors-  N/A SGLT-2 instructions:  Do not take after     Blood Thinner Instructions:  Last dose:   Time: Aspirin  Instructions: ASA 81, hold 5-7 days Last Dose:  Activity level:  Can go up a flight of stairs and perform activities of daily living without stopping and without symptoms of chest pain or shortness of breath.  Able to exercise without symptoms  Unable to go up a flight of stairs without symptoms of     Anesthesia review: CAD, HTN, OSA, CABG x5,   Patient denies shortness of breath, fever, cough and chest pain at PAT appointment  Patient verbalized understanding of instructions that were given to them at the PAT appointment. Patient was also instructed that they will need to review over the PAT instructions again at home before surgery.

## 2023-09-12 NOTE — Patient Instructions (Signed)
Morton Neuralgia  Morton neuralgia is foot pain that affects the ball of the foot and the area near the toes. Morton neuralgia occurs when part of a nerve in the foot (digital nerve) is under too much pressure (compressed). When this happens over a long period of time, the nerve can thicken (neuroma) and cause pain. Pain usually occurs between the third and fourth toes.  Morton neuralgia can come and go but may get worse over time. What are the causes? This condition is caused by doing the same things over and over with your foot, such as: Activities such as running or jumping. Wearing shoes that are too tight. What increases the risk? You may be at higher risk for Morton neuralgia if you: Are male. Wear high heels. Wear shoes that are narrow or tight. Do activities that repeatedly stretch your toes, such as: Running. Ballet. Long-distance walking. What are the signs or symptoms? The first symptom of Morton neuralgia is pain that spreads from the ball of the foot to the toes. It may feel like you are walking on a marble. Pain usually gets worse with walking and goes away at night. Other symptoms may include numbness and cramping of your toes. Both feet are equally affected, but rarely at the same time. How is this diagnosed? This condition is diagnosed based on your symptoms, your medical history, and a physical exam. Your health care provider may: Squeeze your foot just behind your toe. Ask you to move your toes to check for pain. Ask about your physical activity level. You also may have imaging tests, such as an X-ray, ultrasound, or MRI. How is this treated? Treatment depends on how severe your condition is and what causes it. Treatment may involve: Wearing different shoes that are not too tight, are low-heeled, and provide good support. For some people, this is the only treatment needed. Wearing an over-the-counter or custom supportive pad (orthotic) under the front of your  foot. Getting injections of numbing medicine and anti-inflammatory medicine (steroid) in the nerve. Having surgery to remove part of the thickened nerve. Follow these instructions at home: Managing pain, stiffness, and swelling  Massage your foot as needed. Wear orthotics as told by your health care provider. If directed, put ice on the painful area. To do this: Put ice in a plastic bag. Place a towel between your skin and the bag. Leave the ice on for 20 minutes, 2-3 times a day. Remove the ice if your skin turns bright red. This is very important. If you cannot feel pain, heat, or cold, you have a greater risk of damage to the area. Raise (elevate) the injured area above the level of your heart while you are sitting or lying down. Avoid activities that cause pain or make pain worse. If you play sports, ask your health care provider when it is safe for you to return to sports. General instructions Take over-the-counter and prescription medicines only as told by your health care provider. For the time period you were told by your health care provider, do not drive or use machinery. Wear shoes that: Have soft soles. Have a wide toe area. Provide arch support. Do not pinch or squeeze your feet. Have room for your orthotics, if this applies. Keep all follow-up visits. This is important. Contact a health care provider if: Your symptoms get worse or do not get better with treatment and home care. Summary Morton neuralgia is foot pain that affects the ball of the foot and the  area near the toes. Pain usually occurs between the third and fourth toes, gets worse with walking, and goes away at night. Morton neuralgia occurs when part of a nerve in the foot (digital nerve) is under too much pressure. When this happens over a long period of time, the nerve can thicken (neuroma) and cause pain. This condition is caused by doing the same things over and over with your foot, such as running or  jumping, wearing shoes that are too tight, or wearing high heels. Treatment may involve wearing low-heeled shoes that are not too tight, wearing a supportive pad (orthotic) under the front of your foot, getting injections in the nerve, or having surgery to remove part of the thickened nerve. This information is not intended to replace advice given to you by your health care provider. Make sure you discuss any questions you have with your health care provider. Document Revised: 07/23/2020 Document Reviewed: 07/23/2020 Elsevier Patient Education  2024 [employer].

## 2023-09-13 ENCOUNTER — Encounter (HOSPITAL_COMMUNITY)
Admission: RE | Admit: 2023-09-13 | Discharge: 2023-09-13 | Disposition: A | Discharge status: Home / Self Care | Source: Ambulatory Visit | Attending: Orthopedic Surgery | Admitting: Orthopedic Surgery

## 2023-09-13 ENCOUNTER — Other Ambulatory Visit: Payer: Self-pay

## 2023-09-13 ENCOUNTER — Encounter (HOSPITAL_COMMUNITY): Payer: Self-pay

## 2023-09-13 VITALS — BP 146/90 | HR 71 | Temp 97.7°F | Resp 18 | Ht 67.0 in | Wt 222.7 lb

## 2023-09-13 DIAGNOSIS — Z01818 Encounter for other preprocedural examination: Secondary | ICD-10-CM | POA: Diagnosis not present

## 2023-09-13 DIAGNOSIS — Z951 Presence of aortocoronary bypass graft: Secondary | ICD-10-CM | POA: Insufficient documentation

## 2023-09-13 HISTORY — DX: Anemia, unspecified: D64.9

## 2023-09-13 LAB — SURGICAL PCR SCREEN
MRSA, PCR: NEGATIVE
Staphylococcus aureus: NEGATIVE

## 2023-09-13 LAB — CBC
HCT: 40.6 % (ref 39.0–52.0)
Hemoglobin: 12.5 g/dL — ABNORMAL LOW (ref 13.0–17.0)
MCH: 24.8 pg — ABNORMAL LOW (ref 26.0–34.0)
MCHC: 30.8 g/dL (ref 30.0–36.0)
MCV: 80.6 fL (ref 80.0–100.0)
Platelets: 269 K/uL (ref 150–400)
RBC: 5.04 MIL/uL (ref 4.22–5.81)
RDW: 17.6 % — ABNORMAL HIGH (ref 11.5–15.5)
WBC: 9.2 K/uL (ref 4.0–10.5)
nRBC: 0 % (ref 0.0–0.2)

## 2023-09-13 LAB — BASIC METABOLIC PANEL WITH GFR
Anion gap: 10 (ref 5–15)
BUN: 24 mg/dL — ABNORMAL HIGH (ref 8–23)
CO2: 24 mmol/L (ref 22–32)
Calcium: 9.7 mg/dL (ref 8.9–10.3)
Chloride: 101 mmol/L (ref 98–111)
Creatinine, Ser: 1.26 mg/dL — ABNORMAL HIGH (ref 0.61–1.24)
GFR, Estimated: >60 mL/min (ref >60)
Glucose, Bld: 112 mg/dL — ABNORMAL HIGH (ref 70–99)
Potassium: 4.8 mmol/L (ref 3.5–5.1)
Sodium: 135 mmol/L (ref 135–145)

## 2023-09-14 NOTE — Progress Notes (Addendum)
 Case: [id number] Date/Time: 09/20/23 0940   Procedure: REVISION, TOTAL ARTHROPLASTY, SHOULDER (Right: Shoulder) - conversion Right Anatomic to reverse arthroplasty   Anesthesia type: General   Diagnosis: Failure of arthroplasty, initial encounter (HCC) [T84.019A]   Pre-op diagnosis: failed rt shoulder anatomic arthroplasty   [facility]: [facility] ROOM 06 / [facility]   Surgeons: Melita Drivers, MD       DISCUSSION: Victor [name] is a 68 yo male who presents to PAT prior to surgery above. PMH of former smoking, HTN, CAD s/p CABG x 5 (05/19/22), OSA (no CPAP), prediabetes, GERD, fibromyalgia, neuropathy, anxiety, arthritis (s/p bilateral shoulder replacements, right TKA), chronic neck and back pain s/p multiple back surgeries and lumbar fusion L2-L3 (1997, 2019, 2023), s/p ACDF, chronic pain with narcotic dependence.  Had left total shoulder revision in 11/2022 which was uncomplicated.   Patient started seeing Cardiology in Oct 2023 due to atypical chest pain. Had a cardiac PET scan that was c/f ischemia. Underwent cath and ultimately CABG was recommended. Had a CABG x 5 on 05/19/22 with Dr. Lucas. The patient's early postoperative recovery while in the hospital was notable for development of postoperative atrial fibrillation converted with amiodarone . Was not put on a blood thinner. He was discharged from CT surgery care. He last saw Cardiology in clinic on 08/24/23 for pre op clearance. All symptoms stable. Cleared for surgery:   Preoperative clearance: - Patient's RCRI score is 6.6% The patient affirms he has been doing well without any new cardiac symptoms. They are able to achieve 6 METS without cardiac limitations. Therefore, based on ACC/AHA guidelines, the patient would be at acceptable risk for the planned procedure without further cardiovascular testing. The patient was advised that if he develops new symptoms prior to surgery to contact our office to arrange for a follow-up visit, and he verbalized  understanding.  Follows with PCP at [place]. Last seen on 09/04/23 and diagnosed with sinus infection. Prescribed Levaquin (per patient preference). He will be over 2 weeks asymptomatic by DOS. Pt denies symptoms at PAT visit. BP controlled.    VS:  Wt Readings from Last 3 Encounters:  09/13/23 101 kg  09/12/23 101.1 kg  08/24/23 102.5 kg   Temp Readings from Last 3 Encounters:  09/13/23 36.5 C (Oral)  11/30/22 36.6 C  11/13/22 36.4 C (Oral)   BP Readings from Last 3 Encounters:  09/13/23 (!) 146/90  09/12/23 132/80  08/24/23 136/88   Pulse Readings from Last 3 Encounters:  09/13/23 71  09/12/23 75  08/24/23 67     PROVIDERS: Seabron Lenis, MD Cardiology: Alveta Dolphus Reiter, MD is rheumatologist  LABS: Labs reviewed: Acceptable for surgery. Mild anemia. Mild AKI (all labs ordered are listed, but only abnormal results are displayed)  Labs Reviewed - No data to display   IMAGES:  MRI C-spine 06/22/23:  IMPRESSION: 1. Anterior cervical fusion from C3 through C6. 2. Severe foraminal stenoses at multiple levels secondary to uncovertebral joint disease and facet arthropathy. 3. Mild canal stenoses at C2-C3 and C6-C7. MRI lumbar spine 06/22/23:  MRI lumbar spine 06/22/23:  IMPRESSION: 1. Operative changes of posterior fusion from L2 through S1. Interbody fusion at these levels. Prior posterior decompression at T12-L1 as well as from L2-L3 through L4-L5. 2. Severe neuroforaminal stenosis on the right at L2-L3 and bilaterally at L3-L4. Moderate neuroforaminal stenosis bilaterally at L1-L2, on the left at L2-L3, and bilaterally at L4-L5 and L5-S1. 3. Mild canal stenosis at L1-L2.  EKG 08/24/23:  Sinus rhythm with 1st degree  A-V block, rate 67 When compared with ECG of 21-May-2022 11:27, Sinus rhythm has replaced Atrial fibrillation Vent. rate has decreased BY 52 BPM Nonspecific T wave abnormality no longer evident in Inferior leads Nonspecific T wave  abnormality no longer evident in Lateral leads  CV: Echo 05/19/22:   PRE-OP FINDINGS   Left Ventricle: The left ventricle has normal systolic function, with an  ejection fraction of 60-65%, measured 62%. The cavity size was normal. No  evidence of left ventricular regional wall motion abnormalities. There is  no left ventricular hypertrophy.   Left ventricular diastolic function was not evaluated.   Right Ventricle: The right ventricle has normal systolic function. The  cavity was normal. There is no increase in right ventricular wall  thickness. Catheter present in the right ventricle.   Left Atrium: Left atrial size was normal in size. No left atrial/left  atrial appendage thrombus was detected. Left atrial appendage velocity is  normal at greater than 40 cm/s.   Right Atrium: Right atrial size was normal in size. Prominent Eustachian  valve. Catheter present in the right atrium.   Interatrial Septum: No atrial level shunt detected by color flow Doppler.   Pericardium: There is no evidence of pericardial effusion.   Mitral Valve: The mitral valve is normal in structure. Mitral valve  regurgitation is trivial by color flow Doppler. There is no evidence of  mitral valve vegetation. There is no evidence of mitral stenosis, with  peak gradient 4 mmHg, mean gradient 1 mmHg.   Tricuspid Valve: The tricuspid valve was normal in structure. Tricuspid  valve regurgitation was not visualized by color flow Doppler. No evidence  of tricuspid stenosis is present. There is no evidence of tricuspid valve  vegetation.   Aortic Valve: The aortic valve is tricuspid. Aortic valve regurgitation is  trivial by color flow Doppler. There is no stenosis of the aortic valve,  with peak gradient 5 mmHg, mean gradient 3 mmHg. There is no evidence of  aortic valve vegetation.   Pulmonic Valve: The pulmonic valve was normal in structure, with normal  leaflet mobility. No evidence of pulmonic stenosis.  Pulmonic valve  regurgitation is trivial by color flow Doppler, around the PA catheter.    Aorta: The aortic root, ascending aorta and aortic arch are normal in size  and structure. There is evidence of plaque in the descending aorta; Grade  II, measuring 2-12mm in size.   Pulmonary Artery: Norva Purl catheter present on the left. The pulmonary  artery is of normal size.   Venous: The inferior vena cava is normal in size with greater than 50%  respiratory variability, suggesting right atrial pressure of 3 mmHg.   Shunts: There is no evidence of an atrial septal defect.  Past Medical History:  Diagnosis Date   Allergic rhinitis    Anemia    Anxiety    Arteriosclerosis of thoracic aorta (HCC)    Arthritis    all over (04/15/2013)   Back pain, chronic    BMI 32.0-32.9,adult    Cervicalgia    Chronic neck pain    Coronary artery disease 2023   COVID-19    DDD (degenerative disc disease), cervical    DDD (degenerative disc disease), lumbar    Diverticulosis    Elevated hemoglobin (HCC)    Fibromyalgia    GERD (gastroesophageal reflux disease)    Gout    Headache    High cholesterol    History of bronchitis    Hyperkalemia  Hypertension    Insomnia    Kidney disease with benign hypertension    Leg cramps    Right greater than left   Low testosterone     Melanoma (HCC)    Nose   MRSA infection    Neuropathy of both feet    Peripheral vascular disease (HCC)    Personal history of colonic polyps    Pneumonia    Pre-diabetes    Radiculopathy of lumbar region    Raynaud phenomenon    Recurrent sinus infections    Right sided sciatica    Sinus infection 09/05/2023   Sleep apnea    don't use CPAP (04/15/2013)   Tobacco dependence     Past Surgical History:  Procedure Laterality Date   ANTERIOR CERVICAL DECOMP/DISCECTOMY FUSION  2000's X3   had 3 surgeries on my front neck (04/15/2013)   ANTERIOR LAT LUMBAR FUSION Left 08/06/2017   Procedure: LEFT LUMBAR  TWO-THREE ANTERIOR LATERAL LUMBAR FUSION WITH PERCUTANEOUS SCREWS;  Surgeon: Louis Shove, MD;  Location: [facility];  Service: Neurosurgery;  Laterality: Left;   APPENDECTOMY  ~ 2010   BACK SURGERY  05/31/2021   Lumbar fusion L4-L5 L5-S1 with Dr. Louis CARRY TUNNEL RELEASE Left 1990's   CATARACT EXTRACTION, BILATERAL     COLONOSCOPY     CORONARY ARTERY BYPASS GRAFT N/A 05/19/2022   Procedure: CORONARY ARTERY BYPASS GRAFTING (CABG) X FIVE BYPASSES USING OPEN LEFT INTERNAL MAMMARY ARTERY AND ENDOSCOPIC LEFT GREATER SAPHENOUS VEIN HARVEST;  Surgeon: Lucas Dorise POUR, MD;  Location: [facility];  Service: Open Heart Surgery;  Laterality: N/A;   CORONARY PRESSURE/FFR STUDY N/A 02/03/2022   Procedure: INTRAVASCULAR PRESSURE WIRE/FFR STUDY;  Surgeon: Verlin Lonni BIRCH, MD;  [facility];  Service: Cardiovascular;  Laterality: N/A;   ear drum surgery  1974   EYE SURGERY Right    FLEXIBLE SIGMOIDOSCOPY  12/21/2011   Procedure: FLEXIBLE SIGMOIDOSCOPY;  Surgeon: Jerrell KYM Sol, MD;  [facility];  Service: Endoscopy;  Laterality: N/A;   HAND TENDON SURGERY Left ~ 1978 X2-1980's X3   had 5 ORs after laceration   HOT HEMOSTASIS  12/21/2011   Procedure: HOT HEMOSTASIS (ARGON PLASMA COAGULATION/BICAP);  Surgeon: Jerrell KYM Sol, MD;  Location: [facility];  Service: Endoscopy;  Laterality: N/A;   INGUINAL HERNIA REPAIR Left 04/15/2013   Procedure: LAPAROSCOPIC REPAIR OF INCARCERATED LEFT INGUINAL HERNIA;  Surgeon: Camellia CHRISTELLA Blush, MD;  Location: [facility] OR;  Service: General;  Laterality: Left;   INGUINAL HERNIA REPAIR Bilateral 1970's - 1984   INGUINAL HERNIA REPAIR Left 04/15/2013   INSERTION OF MESH Left 04/15/2013   Procedure: INSERTION OF MESH;  Surgeon: Camellia CHRISTELLA Blush, MD;  Location: [facility] OR;  Service: General;  Laterality: Left;   JOINT REPLACEMENT     right knee   KNEE ARTHROSCOPY Right 1990's   LAPAROSCOPY N/A 04/15/2013   Procedure: LAPAROSCOPY DIAGNOSTIC;  Surgeon:  Camellia CHRISTELLA Blush, MD;  Location: [facility] OR;  Service: General;  Laterality: N/A;   LEFT HEART CATH AND CORONARY ANGIOGRAPHY N/A 02/03/2022   Procedure: LEFT HEART CATH AND CORONARY ANGIOGRAPHY;  Surgeon: Verlin Lonni BIRCH, MD;  [facility];  Service: Cardiovascular;  Laterality: N/A;   LUMBAR LAMINECTOMY/DECOMPRESSION MICRODISCECTOMY Right 07/22/2015   Procedure: Right Lumbar Two-Three Microdiscectomy;  Surgeon: Shove Louis, MD;  [facility];  Service: Neurosurgery;  Laterality: Right;   LUMBAR LAMINECTOMY/DECOMPRESSION MICRODISCECTOMY Right 09/05/2016   Procedure: Microdiscectomy - right - T12-L1;  Surgeon: Louis Shove,  MD;  [facility];  Service: Neurosurgery;  Laterality: Right;   NASAL SEPTUM SURGERY Bilateral 2014   removed polyps & infection   POLYPECTOMY  01/10/2011   Procedure: POLYPECTOMY;  Surgeon: Jerrell KYM Sol, MD;  [facility];  Service: Endoscopy;  Laterality: N/A;   POSTERIOR CERVICAL LAMINECTOMY Left 05/24/2020   Procedure: Laminectomy and Foraminotomy - left - Cervical two-Cervical three;  Surgeon: Louis Shove, MD;  Location: [facility] OR;  Service: Neurosurgery;  Laterality: Left;   POSTERIOR LUMBAR FUSION  11/1995   ray cages (04/15/2013)   SHOULDER ARTHROSCOPY Bilateral    twice each   TEE WITHOUT CARDIOVERSION N/A 05/19/2022   Procedure: TRANSESOPHAGEAL ECHOCARDIOGRAM;  Surgeon: Lucas Dorise POUR, MD;  Location: [facility] OR;  Service: Open Heart Surgery;  Laterality: N/A;   TONSILLECTOMY  1960's   TOTAL KNEE ARTHROPLASTY Right 02/24/2014   Procedure: TOTAL RIGHT KNEE ARTHROPLASTY;  Surgeon: Donnice JONETTA Car, MD;  [facility];  Service: Orthopedics;  Laterality: Right;   TOTAL SHOULDER ARTHROPLASTY Left 09/18/2019   Procedure: TOTAL SHOULDER ARTHROPLASTY;  Surgeon: Melita Drivers, MD;  [facility];  Service: Orthopedics;  Laterality: Left;    TOTAL SHOULDER ARTHROPLASTY Right 11/04/2020   Procedure: TOTAL SHOULDER  ARTHROPLASTY;  Surgeon: Melita Drivers, MD;  [facility];  Service: Orthopedics;  Laterality: Right;    TOTAL SHOULDER REVISION Left 11/30/2022   Procedure: Removal Left anatomic shoulder arthroplasty and conversion to Reverse shoulder arthroplasty;  Surgeon: Melita Drivers, MD;  [facility];  Service: Orthopedics;  Laterality: Left;    UPPER GI ENDOSCOPY     VASECTOMY  1988   WISDOM TOOTH EXTRACTION  2000's    MEDICATIONS: No current facility-administered medications for this encounter.    acetaminophen  (TYLENOL ) 500 MG tablet   allopurinol  (ZYLOPRIM ) 300 MG tablet   ALPRAZolam  (XANAX ) 1 MG tablet   amLODipine (NORVASC) 5 MG tablet   aspirin  EC 325 MG tablet   b complex vitamins tablet   Biotin  5000 MCG CAPS   Calcium  Carbonate (CALCIUM  600 PO)   carboxymethylcellulose (REFRESH PLUS) 0.5 % SOLN   colchicine  0.6 MG tablet   cyclobenzaprine  (FLEXERIL ) 10 MG tablet   Evolocumab  (REPATHA  SURECLICK) 140 MG/ML SOAJ   fish oil-omega-3 fatty acids  1000 MG capsule   fluticasone  (FLONASE ) 50 MCG/ACT nasal spray   GINSENG PO   guaiFENesin  (MUCINEX ) 600 MG 12 hr tablet   hydrocortisone cream 1 %   ibuprofen  (ADVIL ) 200 MG tablet   magnesium  gluconate (MAGONATE) 500 MG tablet   Melatonin 10 MG TABS   Methylsulfonylmethane (MSM) 1000 MG CAPS   metoprolol  tartrate (LOPRESSOR ) 25 MG tablet   Multiple Vitamins-Minerals (MULTIVITAMIN WITH MINERALS) tablet   nitroGLYCERIN  (NITROSTAT ) 0.4 MG SL tablet   omeprazole (PRILOSEC) 40 MG capsule   Oxycodone  HCl 10 MG TABS   pravastatin  (PRAVACHOL ) 40 MG tablet   Psyllium (METAMUCIL FIBER PO)   selenium  200 MCG TABS tablet   sildenafil (VIAGRA) 100 MG tablet   silodosin (RAPAFLO) 8 MG CAPS capsule   sodium chloride  (OCEAN) 0.65 % SOLN nasal spray   testosterone  cypionate (DEPOTESTOTERONE CYPIONATE) 200 MG/ML injection   Turmeric 500 MG CAPS   venlafaxine  (EFFEXOR -XR) 150 MG 24 hr capsule   Vitamin D3 (VITAMIN D ) 25 MCG tablet    vitamin E  400 UNIT capsule   clindamycin  (CLEOCIN ) 300 MG capsule   cyclobenzaprine  (FLEXERIL ) 10 MG tablet   levofloxacin (LEVAQUIN) 500 MG tablet   [START ON 09/19/2023] Oxycodone  HCl 10  MG TABS   [START ON 10/19/2023] Oxycodone  HCl 10 MG TABS   [START ON 11/18/2023] Oxycodone  HCl 10 MG TABS    Burnard CHRISTELLA Odis DEVONNA [facility] Short Stay/Anesthesiology [facility] Phone [phone] 09/14/2023 2:28 PM

## 2023-09-14 NOTE — Anesthesia Preprocedure Evaluation (Addendum)
 Anesthesia Evaluation  Patient identified by MRN, date of birth, ID band Patient awake    Reviewed: Allergy & Precautions, H&P , NPO status , Patient's Chart, lab work & pertinent test results, reviewed documented beta blocker date and time   Airway Mallampati: IV  TM Distance: >3 FB Neck ROM: Full    Dental  (+) Teeth Intact, Dental Advisory Given   Pulmonary sleep apnea (hasnt used CPAP since he lost weight) , former smoker   Pulmonary exam normal breath sounds clear to auscultation       Cardiovascular hypertension (149/101 preop), Pt. on medications and Pt. on home beta blockers + CABG (2024) and + Peripheral Vascular Disease  Normal cardiovascular exam Rhythm:Regular Rate:Normal     Neuro/Psych  Headaches PSYCHIATRIC DISORDERS Anxiety        GI/Hepatic Neg liver ROS,GERD  Controlled,,  Endo/Other  negative endocrine ROS    Renal/GU negative Renal ROS  negative genitourinary   Musculoskeletal  (+) Arthritis , Osteoarthritis,  Fibromyalgia -, narcotic dependentOxycodone 10mg  2-3x/d, none this AM Took xanax  this AM   Abdominal   Peds negative pediatric ROS (+)  Hematology negative hematology ROS (+)   Anesthesia Other Findings   Reproductive/Obstetrics negative OB ROS                              Anesthesia Physical Anesthesia Plan  ASA: 3  Anesthesia Plan: General and Regional   Post-op Pain Management: Regional block* and Tylenol  PO (pre-op)*   Induction: Intravenous  PONV Risk Score and Plan: 2 and Ondansetron , Dexamethasone  and Midazolam   Airway Management Planned: Oral ETT  Additional Equipment: None  Intra-op Plan:   Post-operative Plan: Extubation in OR  Informed Consent: I have reviewed the patients History and Physical, chart, labs and discussed the procedure including the risks, benefits and alternatives for the proposed anesthesia with the patient or authorized  representative who has indicated his/her understanding and acceptance.     Dental advisory given  Plan Discussed with: CRNA  Anesthesia Plan Comments: (Last airway note 2024: Laryngoscope Size: Mac and 4 Grade View: Grade I Tube type: Oral Tube size: 7.5 mm )         Anesthesia Quick Evaluation

## 2023-09-20 ENCOUNTER — Inpatient Hospital Stay (HOSPITAL_COMMUNITY): Payer: Self-pay | Admitting: Medical

## 2023-09-20 ENCOUNTER — Other Ambulatory Visit: Payer: Self-pay

## 2023-09-20 ENCOUNTER — Encounter (HOSPITAL_COMMUNITY): Payer: Self-pay | Admitting: Orthopedic Surgery

## 2023-09-20 ENCOUNTER — Inpatient Hospital Stay (HOSPITAL_COMMUNITY)
Admission: RE | Admit: 2023-09-20 | Discharge: 2023-09-20 | DRG: 483 | Disposition: A | Discharge status: Home / Self Care | Attending: Orthopedic Surgery | Admitting: Orthopedic Surgery

## 2023-09-20 ENCOUNTER — Encounter (HOSPITAL_COMMUNITY)
Admission: RE | Disposition: A | Discharge status: Home / Self Care | Payer: Self-pay | Source: Home / Self Care | Attending: Orthopedic Surgery

## 2023-09-20 DIAGNOSIS — M797 Fibromyalgia: Secondary | ICD-10-CM | POA: Diagnosis present

## 2023-09-20 DIAGNOSIS — I7300 Raynaud's syndrome without gangrene: Secondary | ICD-10-CM | POA: Diagnosis not present

## 2023-09-20 DIAGNOSIS — Z951 Presence of aortocoronary bypass graft: Secondary | ICD-10-CM

## 2023-09-20 DIAGNOSIS — G8929 Other chronic pain: Secondary | ICD-10-CM | POA: Diagnosis not present

## 2023-09-20 DIAGNOSIS — N189 Chronic kidney disease, unspecified: Secondary | ICD-10-CM

## 2023-09-20 DIAGNOSIS — I700 Atherosclerosis of aorta: Secondary | ICD-10-CM | POA: Diagnosis present

## 2023-09-20 DIAGNOSIS — I2510 Atherosclerotic heart disease of native coronary artery without angina pectoris: Secondary | ICD-10-CM | POA: Diagnosis present

## 2023-09-20 DIAGNOSIS — T8489XA Other specified complication of internal orthopedic prosthetic devices, implants and grafts, initial encounter: Secondary | ICD-10-CM | POA: Diagnosis not present

## 2023-09-20 DIAGNOSIS — Y792 Prosthetic and other implants, materials and accessory orthopedic devices associated with adverse incidents: Secondary | ICD-10-CM | POA: Diagnosis present

## 2023-09-20 DIAGNOSIS — M75121 Complete rotator cuff tear or rupture of right shoulder, not specified as traumatic: Secondary | ICD-10-CM | POA: Diagnosis not present

## 2023-09-20 DIAGNOSIS — K219 Gastro-esophageal reflux disease without esophagitis: Secondary | ICD-10-CM | POA: Diagnosis not present

## 2023-09-20 DIAGNOSIS — I129 Hypertensive chronic kidney disease with stage 1 through stage 4 chronic kidney disease, or unspecified chronic kidney disease: Secondary | ICD-10-CM

## 2023-09-20 DIAGNOSIS — M549 Dorsalgia, unspecified: Secondary | ICD-10-CM | POA: Diagnosis not present

## 2023-09-20 DIAGNOSIS — F419 Anxiety disorder, unspecified: Secondary | ICD-10-CM | POA: Diagnosis present

## 2023-09-20 DIAGNOSIS — Z9841 Cataract extraction status, right eye: Secondary | ICD-10-CM

## 2023-09-20 DIAGNOSIS — Z9089 Acquired absence of other organs: Secondary | ICD-10-CM

## 2023-09-20 DIAGNOSIS — Z720 Tobacco use: Secondary | ICD-10-CM

## 2023-09-20 DIAGNOSIS — Y831 Surgical operation with implant of artificial internal device as the cause of abnormal reaction of the patient, or of later complication, without mention of misadventure at the time of the procedure: Secondary | ICD-10-CM | POA: Diagnosis present

## 2023-09-20 DIAGNOSIS — T84098A Other mechanical complication of other internal joint prosthesis, initial encounter: Secondary | ICD-10-CM | POA: Diagnosis present

## 2023-09-20 DIAGNOSIS — R7303 Prediabetes: Secondary | ICD-10-CM | POA: Diagnosis not present

## 2023-09-20 DIAGNOSIS — Z9889 Other specified postprocedural states: Secondary | ICD-10-CM

## 2023-09-20 DIAGNOSIS — T84498A Other mechanical complication of other internal orthopedic devices, implants and grafts, initial encounter: Secondary | ICD-10-CM | POA: Diagnosis not present

## 2023-09-20 DIAGNOSIS — Z96651 Presence of right artificial knee joint: Secondary | ICD-10-CM | POA: Diagnosis present

## 2023-09-20 DIAGNOSIS — G629 Polyneuropathy, unspecified: Secondary | ICD-10-CM | POA: Diagnosis not present

## 2023-09-20 DIAGNOSIS — I4891 Unspecified atrial fibrillation: Secondary | ICD-10-CM | POA: Diagnosis present

## 2023-09-20 DIAGNOSIS — M5416 Radiculopathy, lumbar region: Secondary | ICD-10-CM | POA: Diagnosis present

## 2023-09-20 DIAGNOSIS — T84018A Broken internal joint prosthesis, other site, initial encounter: Secondary | ICD-10-CM | POA: Diagnosis not present

## 2023-09-20 DIAGNOSIS — Z803 Family history of malignant neoplasm of breast: Secondary | ICD-10-CM

## 2023-09-20 DIAGNOSIS — Z8701 Personal history of pneumonia (recurrent): Secondary | ICD-10-CM

## 2023-09-20 DIAGNOSIS — M1990 Unspecified osteoarthritis, unspecified site: Secondary | ICD-10-CM | POA: Diagnosis not present

## 2023-09-20 DIAGNOSIS — N186 End stage renal disease: Secondary | ICD-10-CM | POA: Diagnosis not present

## 2023-09-20 DIAGNOSIS — Z8616 Personal history of COVID-19: Secondary | ICD-10-CM | POA: Diagnosis not present

## 2023-09-20 DIAGNOSIS — Z96611 Presence of right artificial shoulder joint: Secondary | ICD-10-CM | POA: Diagnosis not present

## 2023-09-20 DIAGNOSIS — Z981 Arthrodesis status: Secondary | ICD-10-CM

## 2023-09-20 DIAGNOSIS — G4700 Insomnia, unspecified: Secondary | ICD-10-CM | POA: Diagnosis not present

## 2023-09-20 DIAGNOSIS — T84019A Broken internal joint prosthesis, unspecified site, initial encounter: Secondary | ICD-10-CM | POA: Diagnosis present

## 2023-09-20 DIAGNOSIS — I25119 Atherosclerotic heart disease of native coronary artery with unspecified angina pectoris: Secondary | ICD-10-CM

## 2023-09-20 DIAGNOSIS — M542 Cervicalgia: Secondary | ICD-10-CM | POA: Diagnosis present

## 2023-09-20 DIAGNOSIS — I10 Essential (primary) hypertension: Secondary | ICD-10-CM | POA: Diagnosis present

## 2023-09-20 DIAGNOSIS — I739 Peripheral vascular disease, unspecified: Secondary | ICD-10-CM | POA: Diagnosis present

## 2023-09-20 DIAGNOSIS — Z85820 Personal history of malignant melanoma of skin: Secondary | ICD-10-CM

## 2023-09-20 DIAGNOSIS — M109 Gout, unspecified: Secondary | ICD-10-CM | POA: Diagnosis present

## 2023-09-20 DIAGNOSIS — Z96612 Presence of left artificial shoulder joint: Secondary | ICD-10-CM | POA: Diagnosis present

## 2023-09-20 DIAGNOSIS — Z8719 Personal history of other diseases of the digestive system: Secondary | ICD-10-CM

## 2023-09-20 DIAGNOSIS — E7800 Pure hypercholesterolemia, unspecified: Secondary | ICD-10-CM | POA: Diagnosis not present

## 2023-09-20 DIAGNOSIS — G8918 Other acute postprocedural pain: Secondary | ICD-10-CM | POA: Diagnosis not present

## 2023-09-20 DIAGNOSIS — Z8614 Personal history of Methicillin resistant Staphylococcus aureus infection: Secondary | ICD-10-CM

## 2023-09-20 DIAGNOSIS — M51369 Other intervertebral disc degeneration, lumbar region without mention of lumbar back pain or lower extremity pain: Secondary | ICD-10-CM | POA: Diagnosis present

## 2023-09-20 DIAGNOSIS — Z820 Family history of epilepsy and other diseases of the nervous system: Secondary | ICD-10-CM

## 2023-09-20 DIAGNOSIS — Z9842 Cataract extraction status, left eye: Secondary | ICD-10-CM

## 2023-09-20 DIAGNOSIS — Z87891 Personal history of nicotine dependence: Secondary | ICD-10-CM | POA: Diagnosis not present

## 2023-09-20 DIAGNOSIS — Z860100 Personal history of colon polyps, unspecified: Secondary | ICD-10-CM

## 2023-09-20 DIAGNOSIS — Z8249 Family history of ischemic heart disease and other diseases of the circulatory system: Secondary | ICD-10-CM

## 2023-09-20 DIAGNOSIS — G4733 Obstructive sleep apnea (adult) (pediatric): Secondary | ICD-10-CM | POA: Diagnosis present

## 2023-09-20 DIAGNOSIS — Z8709 Personal history of other diseases of the respiratory system: Secondary | ICD-10-CM

## 2023-09-20 DIAGNOSIS — Z9049 Acquired absence of other specified parts of digestive tract: Secondary | ICD-10-CM

## 2023-09-20 DIAGNOSIS — Z842 Family history of other diseases of the genitourinary system: Secondary | ICD-10-CM

## 2023-09-20 HISTORY — PX: TOTAL SHOULDER REVISION: SHX6130

## 2023-09-20 LAB — TYPE AND SCREEN
ABO/RH(D): A POS
Antibody Screen: NEGATIVE

## 2023-09-20 SURGERY — REVISION, TOTAL ARTHROPLASTY, SHOULDER
Anesthesia: Regional | Site: Shoulder | Laterality: Right

## 2023-09-20 MED ORDER — 0.9 % SODIUM CHLORIDE (POUR BTL) OPTIME
TOPICAL | Status: DC | PRN
  Administered 2023-09-20: 1000 mL

## 2023-09-20 MED ORDER — VANCOMYCIN HCL 1000 MG IV SOLR
INTRAVENOUS | Status: AC
  Filled 2023-09-20: qty 20

## 2023-09-20 MED ORDER — DEXAMETHASONE SODIUM PHOSPHATE 10 MG/ML IJ SOLN
INTRAMUSCULAR | Status: DC | PRN
  Administered 2023-09-20: 5 mg via INTRAVENOUS

## 2023-09-20 MED ORDER — ORAL CARE MOUTH RINSE: 15.0000 mL | Freq: Once | OROMUCOSAL | Status: AC

## 2023-09-20 MED ORDER — PHENYLEPHRINE HCL-NACL 20-0.9 MG/250ML-% IV SOLN
INTRAVENOUS | Status: DC | PRN
Start: 2023-09-20 — End: 2023-09-20
  Administered 2023-09-20: 40 ug/min via INTRAVENOUS

## 2023-09-20 MED ORDER — ROCURONIUM BROMIDE 10 MG/ML (PF) SYRINGE
PREFILLED_SYRINGE | INTRAVENOUS | Status: AC
  Filled 2023-09-20: qty 10

## 2023-09-20 MED ORDER — BUPIVACAINE LIPOSOME 1.3 % IJ SUSP
INTRAMUSCULAR | Status: DC | PRN
  Administered 2023-09-20: 10 mL via PERINEURAL

## 2023-09-20 MED ORDER — HYDROMORPHONE HCL 4 MG PO TABS
4.0000 mg | ORAL_TABLET | ORAL | 0 refills | Status: AC | PRN
Start: 2023-09-20 — End: ?

## 2023-09-20 MED ORDER — FENTANYL CITRATE (PF) 100 MCG/2ML IJ SOLN
INTRAMUSCULAR | Status: AC
  Filled 2023-09-20: qty 2

## 2023-09-20 MED ORDER — ONDANSETRON HCL 4 MG PO TABS: 4.0000 mg | ORAL_TABLET | Freq: Three times a day (TID) | ORAL | 0 refills | Status: AC | PRN

## 2023-09-20 MED ORDER — MIDAZOLAM HCL 2 MG/2ML IJ SOLN
1.0000 mg | INTRAMUSCULAR | Status: DC
  Administered 2023-09-20: 1 mg via INTRAVENOUS
  Filled 2023-09-20: qty 2

## 2023-09-20 MED ORDER — PHENYLEPHRINE 80 MCG/ML (10ML) SYRINGE FOR IV PUSH (FOR BLOOD PRESSURE SUPPORT)
PREFILLED_SYRINGE | INTRAVENOUS | Status: DC | PRN
  Administered 2023-09-20: 80 ug via INTRAVENOUS

## 2023-09-20 MED ORDER — CEFAZOLIN SODIUM-DEXTROSE 2-4 GM/100ML-% IV SOLN
2.0000 g | INTRAVENOUS | Status: AC
  Administered 2023-09-20: 2 g via INTRAVENOUS
  Filled 2023-09-20: qty 100

## 2023-09-20 MED ORDER — ACETAMINOPHEN 500 MG PO TABS
1000.0000 mg | ORAL_TABLET | Freq: Once | ORAL | Status: AC
  Administered 2023-09-20: 1000 mg via ORAL
  Filled 2023-09-20: qty 2

## 2023-09-20 MED ORDER — PROPOFOL 10 MG/ML IV BOLUS
INTRAVENOUS | Status: DC | PRN
  Administered 2023-09-20: 150 mg via INTRAVENOUS
  Administered 2023-09-20: 30 mg via INTRAVENOUS

## 2023-09-20 MED ORDER — LIDOCAINE 2% (20 MG/ML) 5 ML SYRINGE
INTRAMUSCULAR | Status: DC | PRN
  Administered 2023-09-20: 40 mg via INTRAVENOUS

## 2023-09-20 MED ORDER — LIDOCAINE HCL (PF) 2 % IJ SOLN
INTRAMUSCULAR | Status: AC
  Filled 2023-09-20: qty 10

## 2023-09-20 MED ORDER — CHLORHEXIDINE GLUCONATE 0.12 % MT SOLN
15.0000 mL | Freq: Once | OROMUCOSAL | Status: AC
  Administered 2023-09-20: 15 mL via OROMUCOSAL

## 2023-09-20 MED ORDER — ONDANSETRON HCL 4 MG/2ML IJ SOLN
INTRAMUSCULAR | Status: AC
Start: 2023-09-20 — End: 2023-09-20
  Filled 2023-09-20: qty 2

## 2023-09-20 MED ORDER — DEXAMETHASONE SODIUM PHOSPHATE 10 MG/ML IJ SOLN
INTRAMUSCULAR | Status: AC
  Filled 2023-09-20: qty 1

## 2023-09-20 MED ORDER — METHOCARBAMOL 1000 MG/10ML IJ SOLN
INTRAMUSCULAR | Status: AC
  Filled 2023-09-20: qty 10

## 2023-09-20 MED ORDER — TRANEXAMIC ACID-NACL 1000-0.7 MG/100ML-% IV SOLN
1000.0000 mg | INTRAVENOUS | Status: AC
  Administered 2023-09-20: 1000 mg via INTRAVENOUS
  Filled 2023-09-20: qty 100

## 2023-09-20 MED ORDER — VANCOMYCIN HCL 1000 MG IV SOLR
INTRAVENOUS | Status: DC | PRN
  Administered 2023-09-20: 1000 mg via TOPICAL

## 2023-09-20 MED ORDER — OXYCODONE HCL 5 MG/5ML PO SOLN: 5.0000 mg | Freq: Once | ORAL | Status: DC | PRN

## 2023-09-20 MED ORDER — GLYCOPYRROLATE 0.2 MG/ML IJ SOLN
INTRAMUSCULAR | Status: DC | PRN
Start: 2023-09-20 — End: 2023-09-20
  Administered 2023-09-20: .2 mg via INTRAVENOUS

## 2023-09-20 MED ORDER — MIDAZOLAM HCL 2 MG/2ML IJ SOLN
INTRAMUSCULAR | Status: AC
  Filled 2023-09-20: qty 2

## 2023-09-20 MED ORDER — OXYCODONE HCL 5 MG PO TABS: 5.0000 mg | ORAL_TABLET | Freq: Once | ORAL | Status: DC | PRN

## 2023-09-20 MED ORDER — PROPOFOL 10 MG/ML IV BOLUS
INTRAVENOUS | Status: AC
Start: 2023-09-20 — End: 2023-09-20
  Filled 2023-09-20: qty 20

## 2023-09-20 MED ORDER — ONDANSETRON HCL 4 MG/2ML IJ SOLN: 4.0000 mg | Freq: Once | INTRAMUSCULAR | Status: DC | PRN

## 2023-09-20 MED ORDER — GLYCOPYRROLATE 0.2 MG/ML IJ SOLN
INTRAMUSCULAR | Status: AC
  Filled 2023-09-20: qty 1

## 2023-09-20 MED ORDER — ROCURONIUM BROMIDE 10 MG/ML (PF) SYRINGE
PREFILLED_SYRINGE | INTRAVENOUS | Status: DC | PRN
  Administered 2023-09-20: 70 mg via INTRAVENOUS

## 2023-09-20 MED ORDER — CYCLOBENZAPRINE HCL 10 MG PO TABS: 10.0000 mg | ORAL_TABLET | Freq: Three times a day (TID) | ORAL | 1 refills | Status: DC | PRN

## 2023-09-20 MED ORDER — ONDANSETRON HCL 4 MG/2ML IJ SOLN
INTRAMUSCULAR | Status: DC | PRN
  Administered 2023-09-20: 4 mg via INTRAVENOUS

## 2023-09-20 MED ORDER — HYDROMORPHONE HCL 1 MG/ML IJ SOLN: 0.2500 mg | INTRAMUSCULAR | Status: DC | PRN

## 2023-09-20 MED ORDER — SUGAMMADEX SODIUM 200 MG/2ML IV SOLN
INTRAVENOUS | Status: DC | PRN
  Administered 2023-09-20: 200 mg via INTRAVENOUS

## 2023-09-20 MED ORDER — LACTATED RINGERS IV SOLN: INTRAVENOUS | Status: DC

## 2023-09-20 MED ORDER — ROPIVACAINE HCL 5 MG/ML IJ SOLN
INTRAMUSCULAR | Status: DC | PRN
  Administered 2023-09-20: 15 mL via PERINEURAL

## 2023-09-20 MED ORDER — AMISULPRIDE (ANTIEMETIC) 5 MG/2ML IV SOLN
10.0000 mg | Freq: Once | INTRAVENOUS | Status: DC | PRN
Start: 2023-09-20 — End: 2023-09-20

## 2023-09-20 MED ORDER — FENTANYL CITRATE (PF) 100 MCG/2ML IJ SOLN
INTRAMUSCULAR | Status: DC | PRN
  Administered 2023-09-20 (×2): 50 ug via INTRAVENOUS

## 2023-09-20 MED ORDER — FENTANYL CITRATE PF 50 MCG/ML IJ SOSY
50.0000 ug | PREFILLED_SYRINGE | INTRAMUSCULAR | Status: DC
  Administered 2023-09-20: 50 ug via INTRAVENOUS
  Filled 2023-09-20: qty 2

## 2023-09-20 MED ORDER — EPHEDRINE SULFATE-NACL 50-0.9 MG/10ML-% IV SOSY
PREFILLED_SYRINGE | INTRAVENOUS | Status: DC | PRN
Start: 2023-09-20 — End: 2023-09-20
  Administered 2023-09-20 (×2): 5 mg via INTRAVENOUS

## 2023-09-20 MED ORDER — METHOCARBAMOL 1000 MG/10ML IJ SOLN
500.0000 mg | Freq: Once | INTRAMUSCULAR | Status: AC
  Administered 2023-09-20: 500 mg via INTRAVENOUS

## 2023-09-20 SURGICAL SUPPLY — 66 items
BAG COUNTER SPONGE SURGICOUNT (BAG) IMPLANT
BAG ZIPLOCK 12X15 (MISCELLANEOUS) ×2 IMPLANT
BIT DRILL AR 3 NS (BIT) IMPLANT
BLADE SAW SGTL 83.5X18.5 (BLADE) IMPLANT
BNDG COHESIVE 4X5 TAN STRL LF (GAUZE/BANDAGES/DRESSINGS) ×2 IMPLANT
CNTNR URN SCR LID CUP LEK RST (MISCELLANEOUS) IMPLANT
COOLER ICEMAN CLASSIC (MISCELLANEOUS) ×2 IMPLANT
COVER BACK TABLE 60X90IN (DRAPES) ×2 IMPLANT
COVER SURGICAL LIGHT HANDLE (MISCELLANEOUS) ×2 IMPLANT
CUP SUT UNIV REVERS 42 NEUT (Shoulder) IMPLANT
DRAPE INCISE IOBAN 66X45 STRL (DRAPES) IMPLANT
DRAPE SHEET LG 3/4 BI-LAMINATE (DRAPES) ×2 IMPLANT
DRAPE SURG 17X11 SM STRL (DRAPES) ×2 IMPLANT
DRAPE SURG ORHT 6 SPLT 77X108 (DRAPES) ×4 IMPLANT
DRAPE TOP 10253 STERILE (DRAPES) ×2 IMPLANT
DRAPE U-SHAPE 47X51 STRL (DRAPES) ×2 IMPLANT
DRESSING AQUACEL AG SP 3.5X6 (GAUZE/BANDAGES/DRESSINGS) IMPLANT
DRSG AQUACEL AG ADV 3.5X 6 (GAUZE/BANDAGES/DRESSINGS) IMPLANT
DRSG AQUACEL AG ADV 3.5X10 (GAUZE/BANDAGES/DRESSINGS) IMPLANT
DURAPREP 26ML APPLICATOR (WOUND CARE) ×2 IMPLANT
ELECT BLADE TIP CTD 4 INCH (ELECTRODE) ×2 IMPLANT
ELECT PENCIL ROCKER SW 15FT (MISCELLANEOUS) ×2 IMPLANT
ELECT REM PT RETURN 15FT ADLT (MISCELLANEOUS) ×2 IMPLANT
FACESHIELD WRAPAROUND (MASK) ×5 IMPLANT
FACESHIELD WRAPAROUND OR TEAM (MASK) ×10 IMPLANT
GLENOID SYS 42 +4 LAT/24 SHLDR (Miscellaneous) IMPLANT
GLENOID UNI REV MOD 24 +2 LAT (Joint) IMPLANT
GLOVE BIO SURGEON STRL SZ7.5 (GLOVE) ×2 IMPLANT
GLOVE BIO SURGEON STRL SZ8 (GLOVE) ×2 IMPLANT
GLOVE SS BIOGEL STRL SZ 7 (GLOVE) ×2 IMPLANT
GLOVE SS BIOGEL STRL SZ 7.5 (GLOVE) ×2 IMPLANT
GOWN STRL SURGICAL XL XLNG (GOWN DISPOSABLE) ×4 IMPLANT
INSERT HUM CUP L/42 +6 (Insert) IMPLANT
KIT BASIN OR (CUSTOM PROCEDURE TRAY) ×2 IMPLANT
KIT TURNOVER KIT A (KITS) ×2 IMPLANT
MANIFOLD NEPTUNE II (INSTRUMENTS) ×2 IMPLANT
NDL TAPERED W/ NITINOL LOOP (MISCELLANEOUS) ×2 IMPLANT
NEEDLE TAPERED W/ NITINOL LOOP (MISCELLANEOUS) ×1 IMPLANT
NS IRRIG 1000ML POUR BTL (IV SOLUTION) ×2 IMPLANT
PACK SHOULDER (CUSTOM PROCEDURE TRAY) ×2 IMPLANT
PAD ARMBOARD POSITIONER FOAM (MISCELLANEOUS) ×2 IMPLANT
PAD COLD SHLDR WRAP-ON (PAD) ×2 IMPLANT
PIN SET MODULAR GLENOID SYSTEM (PIN) ×2 IMPLANT
RESTRAINT HEAD UNIVERSAL NS (MISCELLANEOUS) ×2 IMPLANT
SCREW CENTRAL MOD 30MM (Screw) IMPLANT
SCREW PERI LOCK 5.5X24 (Screw) IMPLANT
SCREW PERIPHERAL 5.5X20 LOCK (Screw) IMPLANT
SCREW PERIPHERAL 5.5X28 LOCK (Screw) IMPLANT
SET HNDPC FAN SPRY TIP SCT (DISPOSABLE) ×2 IMPLANT
SLING ARM FOAM STRAP LRG (SOFTGOODS) IMPLANT
SLING ARM FOAM STRAP MED (SOFTGOODS) IMPLANT
STEM HUMERAL UNI REV SIZE 12 (Stem) IMPLANT
STRIP CLOSURE SKIN 1/2X4 (GAUZE/BANDAGES/DRESSINGS) ×2 IMPLANT
SUCTION TUBE FRAZIER 12FR DISP (SUCTIONS) IMPLANT
SUT MNCRL AB 3-0 PS2 18 (SUTURE) ×2 IMPLANT
SUT MON AB 2-0 CT1 36 (SUTURE) ×2 IMPLANT
SUT VIC AB 1 CT1 36 (SUTURE) ×2 IMPLANT
SUTURE TAPE 1.3 40 TPR END (SUTURE) ×4 IMPLANT
SWAB COLLECTION DEVICE MRSA (MISCELLANEOUS) IMPLANT
SWAB CULTURE ESWAB REG 1ML (MISCELLANEOUS) IMPLANT
TOWEL GREEN STERILE FF (TOWEL DISPOSABLE) ×2 IMPLANT
TOWEL OR 17X26 10 PK STRL BLUE (TOWEL DISPOSABLE) ×2 IMPLANT
TOWER SMARTMIX MINI (MISCELLANEOUS) IMPLANT
TUBE SUCTION HIGH CAP CLEAR NV (SUCTIONS) ×2 IMPLANT
TUBING CONNECTING 10 (TUBING) ×2 IMPLANT
WATER STERILE IRR 1000ML POUR (IV SOLUTION) ×2 IMPLANT

## 2023-09-20 NOTE — Anesthesia Procedure Notes (Signed)
 Anesthesia Regional Block: Interscalene brachial plexus block   Pre-Anesthetic Checklist: , timeout performed,  Correct Patient, Correct Site, Correct Laterality,  Correct Procedure, Correct Position, site marked,  Risks and benefits discussed,  Surgical consent,  Pre-op evaluation,  At surgeon's request and post-op pain management  Laterality: Right  Prep: Maximum Sterile Barrier Precautions used, chloraprep       Needles:  Injection technique: Single-shot  Needle Type: Echogenic Stimulator Needle     Needle Length: 9cm  Needle Gauge: 22     Additional Needles:   Procedures:,,,, ultrasound used (permanent image in chart),,    Narrative:  Start time: 09/20/2023 9:30 AM End time: 09/20/2023 9:35 AM Injection made incrementally with aspirations every 5 mL.  Performed by: Personally  Anesthesiologist: Merla Almarie HERO, DO  Additional Notes: Monitors applied. No increased pain on injection. No increased resistance to injection. Injection made in 5cc increments. Good needle visualization. Patient tolerated procedure well.

## 2023-09-20 NOTE — H&P (Signed)
 Victor [name]    Chief Complaint: failed rt shoulder anatomic arthroplasty HPI: The patient is a 68 y.o. 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  Patient has multiple underlying significant medical comorbidities as outlined within the preop clearance notes and throughout the medical record.  Past Medical History:  Diagnosis Date   Allergic rhinitis    Anemia    Anxiety    Arteriosclerosis of thoracic aorta (HCC)    Arthritis    all over (04/15/2013)   Back pain, chronic    BMI 32.0-32.9,adult    Cervicalgia    Chronic neck pain    Coronary artery disease 2023   COVID-19    DDD (degenerative disc disease), cervical    DDD (degenerative disc disease), lumbar    Diverticulosis    Elevated hemoglobin (HCC)    Fibromyalgia    GERD (gastroesophageal reflux disease)    Gout    Headache    High cholesterol    History of bronchitis    Hyperkalemia    Hypertension    Insomnia    Kidney disease with benign hypertension    Leg cramps    Right greater than left   Low testosterone     Melanoma (HCC)    Nose   MRSA infection    Neuropathy of both feet    Peripheral vascular disease (HCC)    Personal history of colonic polyps    Pneumonia    Pre-diabetes    Radiculopathy of lumbar region    Raynaud phenomenon    Recurrent sinus infections    Right sided sciatica    Sinus infection 09/05/2023   Sleep apnea    don't use CPAP (04/15/2013)   Tobacco dependence        Past Surgical History:  Procedure Laterality Date   ANTERIOR CERVICAL DECOMP/DISCECTOMY FUSION  2000's X3   had 3 surgeries on my front neck (04/15/2013)   ANTERIOR LAT LUMBAR FUSION Left 08/06/2017   Procedure: LEFT LUMBAR TWO-THREE ANTERIOR LATERAL LUMBAR FUSION WITH PERCUTANEOUS SCREWS;  Surgeon: Louis Shove, MD;  Location: [facility] OR;  Service: Neurosurgery;  Laterality: Left;   APPENDECTOMY  ~ 2010   BACK SURGERY  05/31/2021   Lumbar fusion L4-L5 L5-S1 with Dr. Louis CARRY TUNNEL RELEASE Left 1990's   CATARACT EXTRACTION, BILATERAL     COLONOSCOPY     CORONARY ARTERY BYPASS GRAFT N/A 05/19/2022   Procedure: CORONARY ARTERY BYPASS GRAFTING (CABG) X FIVE BYPASSES USING OPEN LEFT INTERNAL MAMMARY ARTERY AND ENDOSCOPIC LEFT GREATER SAPHENOUS VEIN HARVEST;  Surgeon: Lucas Dorise POUR, MD;  Location: [facility] OR;  Service: Open Heart Surgery;  Laterality: N/A;   CORONARY PRESSURE/FFR STUDY N/A 02/03/2022   Procedure: INTRAVASCULAR PRESSURE WIRE/FFR STUDY;  Surgeon: Verlin Lonni BIRCH, MD;  Location: [facility];  Service: Cardiovascular;  Laterality: N/A;   ear drum surgery  1974   EYE SURGERY Right    FLEXIBLE SIGMOIDOSCOPY  12/21/2011   Procedure: FLEXIBLE SIGMOIDOSCOPY;  Surgeon: Jerrell KYM Sol, MD;  Location: [facility];  Service: Endoscopy;  Laterality: N/A;   HAND TENDON SURGERY Left ~ 1978 X2-1980's  X3   had 5 ORs after laceration   HOT HEMOSTASIS  12/21/2011   Procedure: HOT HEMOSTASIS (ARGON PLASMA COAGULATION/BICAP);  Surgeon: Jerrell KYM Sol, MD;  Location: [facility];  Service: Endoscopy;  Laterality: N/A;   INGUINAL HERNIA REPAIR Left 04/15/2013   Procedure: LAPAROSCOPIC REPAIR OF INCARCERATED LEFT INGUINAL HERNIA;  Surgeon: Camellia CHRISTELLA Blush, MD;  Location: [facility] OR;  Service: General;  Laterality: Left;   INGUINAL HERNIA REPAIR Bilateral 1970's - 1984   INGUINAL HERNIA REPAIR Left 04/15/2013   INSERTION OF MESH Left 04/15/2013   Procedure: INSERTION OF  MESH;  Surgeon: Camellia CHRISTELLA Blush, MD;  Location: [facility] OR;  Service: General;  Laterality: Left;   JOINT REPLACEMENT     right knee   KNEE ARTHROSCOPY Right 1990's   LAPAROSCOPY N/A 04/15/2013   Procedure: LAPAROSCOPY DIAGNOSTIC;  Surgeon: Camellia CHRISTELLA Blush, MD;  Location: [facility] OR;  Service: General;  Laterality: N/A;   LEFT HEART CATH AND CORONARY ANGIOGRAPHY N/A 02/03/2022   Procedure: LEFT HEART CATH AND CORONARY ANGIOGRAPHY;  Surgeon: Verlin Lonni BIRCH, MD;  [facility]: [facility];  Service: Cardiovascular;  Laterality: N/A;   LUMBAR LAMINECTOMY/DECOMPRESSION MICRODISCECTOMY Right 07/22/2015   Procedure: Right Lumbar Two-Three Microdiscectomy;  Surgeon: Victory Gunnels, MD;  Location: [facility];  Service: Neurosurgery;  Laterality: Right;   LUMBAR LAMINECTOMY/DECOMPRESSION MICRODISCECTOMY Right 09/05/2016   Procedure: Microdiscectomy - right - T12-L1;  Surgeon: Gunnels Victory, MD;  Location: [facility] OR;  Service: Neurosurgery;  Laterality: Right;   NASAL SEPTUM SURGERY Bilateral 2014   removed polyps & infection   POLYPECTOMY  01/10/2011   Procedure: POLYPECTOMY;  Surgeon: Jerrell KYM Sol, MD;  Location: [facility];  Service: Endoscopy;  Laterality: N/A;   POSTERIOR CERVICAL LAMINECTOMY Left 05/24/2020   Procedure: Laminectomy and Foraminotomy - left - Cervical two-Cervical three;  Surgeon: Gunnels Victory, MD;  Location: [facility] OR;  Service: Neurosurgery;  Laterality: Left;   POSTERIOR LUMBAR FUSION  11/1995   ray cages (04/15/2013)   SHOULDER ARTHROSCOPY Bilateral    twice each   TEE WITHOUT CARDIOVERSION N/A 05/19/2022   Procedure: TRANSESOPHAGEAL ECHOCARDIOGRAM;  Surgeon: Lucas Dorise POUR, MD;  Location: [facility] OR;  Service: Open Heart Surgery;  Laterality: N/A;   TONSILLECTOMY  1960's   TOTAL KNEE ARTHROPLASTY Right 02/24/2014   Procedure: TOTAL RIGHT KNEE ARTHROPLASTY;  Surgeon: Donnice BIRCH Car, MD;  Location: [facility];  Service: Orthopedics;  Laterality: Right;   TOTAL SHOULDER ARTHROPLASTY  Left 09/18/2019   Procedure: TOTAL SHOULDER ARTHROPLASTY;  Surgeon: Melita Drivers, MD;  Location: [facility];  Service: Orthopedics;  Laterality: Left;    TOTAL SHOULDER ARTHROPLASTY Right 11/04/2020   Procedure: TOTAL SHOULDER ARTHROPLASTY;  Surgeon: Melita Drivers, MD;  Location: [facility];  Service: Orthopedics;  Laterality: Right;    TOTAL SHOULDER REVISION Left 11/30/2022   Procedure: Removal Left anatomic shoulder arthroplasty and conversion to Reverse shoulder arthroplasty;  Surgeon: Melita Drivers, MD;  Location: [facility];  Service: Orthopedics;  Laterality: Left;    UPPER GI ENDOSCOPY     VASECTOMY  1988   WISDOM TOOTH EXTRACTION  2000's    Family History  Problem Relation Age of Onset   Hypertension Father    Cancer Mother        breast    Heart Problems Mother    Alzheimer's disease Mother    Allergies Daughter    Interstitial cystitis Daughter     Social History:  reports that he quit smoking about 13 years ago.  His smoking use included cigarettes and cigars. He started smoking about 28 years ago. He has a 1.5 pack-year smoking history. He has been exposed to tobacco smoke. He has quit using smokeless tobacco.  His smokeless tobacco use included chew. He reports that he does not currently use alcohol  after a past usage of about 2.0 standard drinks of alcohol  per week. He reports that he does not use drugs.  BMI: Estimated body mass index is 34.87 kg/m as calculated from the following:   Height as of 09/13/23: 5' 7 (1.702 m).   Weight as of 09/13/23: 101 kg.  Lab Results  Component Value Date   ALBUMIN  4.4 08/02/2022   Diabetes: Patient does not have a diagnosis of diabetes.     Smoking Status:       No medications prior to admission.     Physical Exam: Inspection of the right shoulder demonstrates a well-healed anterior deltopectoral incision.  He is neurovascular intact in right upper extremity.  He demonstrates overall good right shoulder motion but  has obvious pain with limitations at end range.  Plain films confirm presence of an anatomic arthroplasty with evidence for anterior instability.  Vitals     Assessment/Plan  Impression: failed rt shoulder anatomic arthroplasty  Plan of Action: Procedure(s): REVISION, TOTAL ARTHROPLASTY, SHOULDER  Xavyer Steenson M Courtany Mcmurphy 09/20/2023, 6:25 AM Contact # [phone]

## 2023-09-20 NOTE — Anesthesia Procedure Notes (Signed)
 Procedure Name: Intubation Date/Time: 09/20/2023 10:18 AM  Performed by: Franchot Delon RAMAN, CRNAPre-anesthesia Checklist: Patient identified, Emergency Drugs available, Suction available and Patient being monitored Patient Re-evaluated:Patient Re-evaluated prior to induction Oxygen Delivery Method: Circle System Utilized Preoxygenation: Pre-oxygenation with 100% oxygen Induction Type: IV induction Ventilation: Mask ventilation without difficulty Laryngoscope Size: Mac and 4 Grade View: Grade I Tube type: Oral Tube size: 7.5 mm Number of attempts: 1 Airway Equipment and Method: Stylet Placement Confirmation: ETT inserted through vocal cords under direct vision, positive ETCO2 and breath sounds checked- equal and bilateral Secured at: 22 cm Tube secured with: Tape Dental Injury: Teeth and Oropharynx as per pre-operative assessment

## 2023-09-20 NOTE — Op Note (Signed)
 09/20/2023  11:44 AM  Victor [name]:   Victor [name]  68 y.o. male  PRE-OPERATIVE DIAGNOSIS:  failed rt shoulder anatomic arthroplasty  POST-OPERATIVE DIAGNOSIS: Same with operative finding of catastrophic disruption of the entire anterior and superior rotator cuff  PROCEDURE: Removal of failed right shoulder anatomic arthroplasty and conversion to a reverse shoulder arthroplasty lysing a press-fit size 10 Arthrex stem standard length with a neutral metathesis, +6 constrained polyethylene insert, 42/+4 glenosphere and a small/+2 baseplate, 30 mm lag screw  SURGEON:  Yides Saidi, Franky BATTLE M.D.  ASSISTANTS: Randine Ricks, PA-C  Randine Ricks, PA-C was utilized as an Geophysicist/field seismologist throughout this case, essential for help with positioning the Victor [name], positioning extremity, tissue manipulation, implantation of the prosthesis, suture management, wound closure, and intraoperative decision-making.  ANESTHESIA:   General Endotracheal and interscalene block with Exparel   EBL: 200 cc  SPECIMEN: None  Drains: None   Victor [name] DISPOSITION:  PACU - hemodynamically stable.    PLAN OF CARE: Discharge to home after PACU  Brief history:  Victor [name] is a 68 year old gentleman well-known to our practice after previous bilateral shoulder anatomic arthroplasties which unfortunately have gone on to catastrophic rotator cuff failures, now status post left shoulder conversion to reverse arthroplasty doing well, presenting today for planned conversion of right shoulder anatomic to reverse arthroplasty.  Preoperatively, I counseled the Victor [name] regarding treatment options and risks versus benefits thereof.  Possible surgical complications were all reviewed including potential for bleeding, infection, neurovascular injury, persistent pain, loss of motion, anesthetic complication, failure of the implant, and possible need for additional surgery. They understand and accept and agrees with our planned  procedure.   Procedure in detail:  After undergoing routine preop evaluation the Victor [name] received prophylactic antibiotics and interscalene block with Exparel  was established in the holding area by the anesthesia department.  Subsequently placed spine on the operating table and underwent the smooth induction of a general endotracheal anesthesia.  Placed into the beachchair position and appropriately padded and protected.  Right shoulder girdle region was sterilely prepped and draped in standard fashion.  Timeout was called.  Deltopectoral approach the right shoulder was made through the previous incision approximate 12 cm length.  Skin flaps elevated dissection carried deeply and the deltopectoral interval was then developed from proximal to distal with the vein taken laterally.  Significant scarring in the deep layers was encountered and dissection carried beneath the deltoid allowing access to the humeral head and proximal humeral metaphysis and the conjoined tendon was scarified to the underlying remnant of the subscap and so we simply retracted the combined subscap remnant and conjoined tendon.  This allowed delivery of the humeral head through the wound.  An osteotome was then used to elevate the humeral head away from the trunnion and the finding was then removed using standard technique.  We then placed a metal cap over the cut proximal humeral surface and exposed the glenoid and performed a circumferential removal of soft tissues to gain visualization of the entire circumference of the size large glenoid.  It was then sequentially mobilized with an osteotome and removed with the central post intact and then the residual portion of the superior and inferior peg and slide were removed with careful dissection.  A guidepin was then directed into the center of the glenoid and the glenoid was then reamed with the central followed by the peripheral reamer for a planned 42 glenosphere.  Preparation completed  with a drill and tapped for a 30 mm lag screw.  Our  baseplate was then assembled and inserted with vancomycin  powder applied the lag screw and excellent fixation was achieved.  All the pro locking screws in the place using standard technique with excellent fixation.  Irrigation completed.  A 42/+4 glenosphere was then impacted onto the baseplate and a central locking screw was placed.  Attention was then returned back to the proximal humerus where the canal was opened we ultimately broached up to a size 12 long stem at 20 degrees of retroversion.  A neutral metaphyseal reaming guide was then used to prepare the metaphysis.  A trial implant was placed and trial reduction showed good motion stability and soft tissue balance.  Trial was removed.  The final implant was assembled.  The canal was irrigated cleaned and dried and vancomycin  powder applied into the canal.  Our final implant was seated with excellent fixation.  Trial reductions were then performed ultimately felt that the +6 gave is the best motion stability and soft tissue balance.  Given the subscap deficiency we used a +6 constrained poly which was impacted onto the implant and a final reduction showed excellent motion stability and soft tissue balance.  Final irrigation completed.  Hemostasis was obtained.  The balance of vancomycin  powder was then spread liberally throughout the deep soft tissue planes.  Deltopectoral interval reapproximated with a series of figure-of-eight number Vicryl sutures.  2-0 Monocryl used for the subcu layer and intracuticular 3-0 Monocryl for the skin followed by Steri-Strips and Aquacel dressing.  Brought and placed in a sling.  The Victor [name] was awakened, extubated, taken to the cover room in stable condition.  Franky CHRISTELLA Pointer MD   Contact # [phone]

## 2023-09-20 NOTE — Transfer of Care (Signed)
 Immediate Anesthesia Transfer of Care Note  Patient: Victor [name]  Procedure(s) Performed: REVISION, TOTAL ARTHROPLASTY, SHOULDER (Right: Shoulder)  Patient Location: PACU  Anesthesia Type:General  Level of Consciousness: awake, alert , oriented, and patient cooperative  Airway & Oxygen Therapy: Patient Spontanous Breathing and Patient connected to face mask oxygen  Post-op Assessment: Report given to RN and Post -op Vital signs reviewed and stable  Post vital signs: Reviewed and stable  Last Vitals:  Vitals Value Taken Time  BP 135/90 09/20/23 12:02  Temp    Pulse 80 09/20/23 12:04  Resp 13 09/20/23 12:04  SpO2 100 % 09/20/23 12:04  Vitals shown include unfiled device data.  Last Pain:  Vitals:   09/20/23 0933  TempSrc:   PainSc: 0-No pain      Patients Stated Pain Goal: 2 (09/20/23 0925)  Complications: No notable events documented.

## 2023-09-20 NOTE — Anesthesia Postprocedure Evaluation (Signed)
 Anesthesia Post Note  Patient: Victor [name]  Procedure(s) Performed: REVISION, TOTAL ARTHROPLASTY, SHOULDER (Right: Shoulder)     Patient location during evaluation: PACU Anesthesia Type: Regional and General Level of consciousness: awake and alert, oriented and patient cooperative Pain management: pain level controlled Vital Signs Assessment: post-procedure vital signs reviewed and stable Respiratory status: spontaneous breathing, nonlabored ventilation and respiratory function stable Cardiovascular status: blood pressure returned to baseline and stable Postop Assessment: no apparent nausea or vomiting Anesthetic complications: no   No notable events documented.  Last Vitals:  Vitals:   09/20/23 1202 09/20/23 1215  BP: (!) 135/90 132/86  Pulse: 78 82  Resp: 14 (!) 23  Temp: 36.4 C   SpO2: 100% 97%    Last Pain:  Vitals:   09/20/23 1215  TempSrc:   PainSc: 3                  Almarie CHRISTELLA Marchi

## 2023-09-20 NOTE — Discharge Instructions (Signed)

## 2023-09-20 NOTE — Discharge Summary (Signed)
 PATIENT ID:      Victor [name]  MRN:     [id number] DOB/AGE:    Oct 11, 1955 / 68 y.o.     DISCHARGE SUMMARY  ADMISSION DATE:    09/20/2023 DISCHARGE DATE:  09/20/2023  ADMISSION DIAGNOSIS: failed rt shoulder anatomic arthroplasty Past Medical History:  Diagnosis Date   Allergic rhinitis    Anemia    Anxiety    Arteriosclerosis of thoracic aorta (HCC)    Arthritis    all over (04/15/2013)   Back pain, chronic    BMI 32.0-32.9,adult    Cervicalgia    Chronic neck pain    Coronary artery disease 2023   COVID-19    DDD (degenerative disc disease), cervical    DDD (degenerative disc disease), lumbar    Diverticulosis    Elevated hemoglobin (HCC)    Fibromyalgia    GERD (gastroesophageal reflux disease)    Gout    Headache    High cholesterol    History of bronchitis    Hyperkalemia    Hypertension    Insomnia    Kidney disease with benign hypertension    Leg cramps    Right greater than left   Low testosterone     Melanoma (HCC)    Nose   MRSA infection    Neuropathy of both feet    Peripheral vascular disease (HCC)    Personal history of colonic polyps    Pneumonia    Pre-diabetes    Radiculopathy of lumbar region    Raynaud phenomenon    Recurrent sinus infections    Right sided sciatica    Sinus infection 09/05/2023   Sleep apnea    don't use CPAP (04/15/2013)   Tobacco dependence     DISCHARGE DIAGNOSIS:   Principal Problem:   S/P reverse total shoulder arthroplasty, right   PROCEDURE: Procedure(s): REVISION, TOTAL ARTHROPLASTY, SHOULDER on 09/20/2023  CONSULTS:    HISTORY:  See H&P in chart.  HOSPITAL COURSE:  Victor [name] is a 68 y.o. admitted on 09/20/2023 with a diagnosis of failed rt shoulder anatomic arthroplasty.  They were brought to the operating room on 09/20/2023 and underwent Procedure(s): REVISION, TOTAL ARTHROPLASTY, SHOULDER.    They were given perioperative antibiotics:  Anti-infectives (From admission, onward)     Start     Dose/Rate Route Frequency Ordered Stop   09/20/23 1039  vancomycin  (VANCOCIN ) powder  Status:  Discontinued          As needed 09/20/23 1039 09/20/23 1200   09/20/23 0800  ceFAZolin  (ANCEF ) IVPB 2g/100 mL premix        2 g 200 mL/hr over 30 Minutes Intravenous On call to O.R. 09/20/23 0755 09/20/23 1019     .  Patient underwent the above named procedure and tolerated it well. The following day they were hemodynamically stable and pain was controlled on oral analgesics. They were neurovascularly intact to the operative extremity. OT was ordered and worked with patient per protocol. They were medically and orthopaedically stable for discharge on 09/20/2023.    DIAGNOSTIC STUDIES:  RECENT RADIOGRAPHIC STUDIES :  XR Foot 2 Views Right Result Date: 09/12/2023 First MTP, PIP and DIP narrowing was noted.  No intertarsal, tibiotalar or subtalar joint space narrowing was noted.  Posterior spurring was noted.  No erosive changes were noted. 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   RECENT VITAL SIGNS:  Patient Vitals for the past 24 hrs:  BP Temp Temp src Pulse Resp SpO2  09/20/23 1305 (!) 128/99 -- -- 76 20 93 %  09/20/23 1300 (!) 132/92 97.7 F (36.5 C) -- 74 14 94 %  09/20/23 1245 (!) 136/95 -- -- 78 19 94 %  09/20/23 1230 123/81 -- -- 71 15 95 %  09/20/23 1215 132/86 -- -- 82 (!) 23 97 %  09/20/23 1202 (!) 135/90 97.6 F (36.4 C) -- 78 14 100 %  09/20/23 0940 -- -- -- 67 10 97 %  09/20/23 0939 -- -- -- 65 17 97 %  09/20/23 0938 -- -- -- 67 12 97 %  09/20/23 0937 -- -- -- 67 12 98 %  09/20/23 0936 -- -- -- 68 11 98 %  09/20/23 0935 (!) 131/95 -- -- 64 13 97 %  09/20/23 0934 -- -- -- 63 12 97 %  09/20/23 0933 -- -- -- 62 11 98 %  09/20/23 0932 -- -- -- 62 11 97 %  09/20/23 0931 -- -- -- 63 13 97 %  09/20/23 0930 (!) 150/106 -- -- 65 12 99 %  09/20/23 0929 -- -- -- 66 13 98 %  09/20/23 0928 (!) 160/109 -- -- 64 11 98 %  09/20/23 0927 -- -- -- 67  10 98 %  09/20/23 0741 (!) 149/101 97.8 F (36.6 C) Oral 68 18 99 %  .  RECENT EKG RESULTS:    Orders placed or performed in visit on 08/24/23   EKG 12-Lead    DISCHARGE INSTRUCTIONS:  Discharge Instructions     Diet - low sodium heart healthy   Complete by: As directed    Increase activity slowly   Complete by: As directed    No wound care   Complete by: As directed        DISCHARGE MEDICATIONS:  same as on preop, see med rec   FOLLOW UP VISIT:    Follow-up Information     Atiyah Bauer, Randine, PA-C Follow up.   Specialty: Orthopedic Surgery Why: on 10/03/23 at 10:45 Contact information: [address] [place] [address] [phone]                 DISCHARGE UN:ynfz   DISCHARGE CONDITION:  Metta Randine Synai Prettyman for Dr. Franky Pointer 09/20/2023, 2:33 PM

## 2023-09-21 ENCOUNTER — Encounter (HOSPITAL_COMMUNITY): Payer: Self-pay | Admitting: Orthopedic Surgery

## 2023-09-25 ENCOUNTER — Ambulatory Visit: Admitting: Physician Assistant

## 2023-09-27 ENCOUNTER — Other Ambulatory Visit (HOSPITAL_COMMUNITY): Payer: Self-pay

## 2023-09-28 ENCOUNTER — Telehealth: Payer: Self-pay

## 2023-09-28 DIAGNOSIS — M1A09X0 Idiopathic chronic gout, multiple sites, without tophus (tophi): Secondary | ICD-10-CM

## 2023-09-28 MED ORDER — PREDNISONE 5 MG PO TABS: ORAL_TABLET | ORAL | 0 refills | Status: AC

## 2023-09-28 NOTE — Addendum Note (Signed)
 Addended by: JEANNETTA LONNI ORN on: 09/28/2023 11:24 AM   Modules accepted: Orders

## 2023-09-28 NOTE — Telephone Encounter (Signed)
 Patient and patient's wife advised Dr. Jeannetta is sending a prednisone  taper to [facility] for 8 days total similar to the one he took earlier this year prescribed by Dr. Dolphus.   If symptoms do not clear up with this then we will need to contact the office again and he may need to come in to be seen. Dr. Jeannetta would not recommend any more prolonged or higher dose of steroids without a clinic visit due to his recent shoulder surgery. Patient and patient's wife verbalized understanding.

## 2023-09-28 NOTE — Telephone Encounter (Signed)
 Patient's wife contacted the office and states the patient is currently in a Gout flare. The flare has been going on for a couple of days now and has not stopped since. The flare has affected the patient's hands and feet. Patient's wife has requested a prednisone  taper for the patient to help with the flare, they would like the prednisone  to be sent to [facility] on [address]. Please advise.

## 2023-09-28 NOTE — Telephone Encounter (Signed)
 I am sending a prednisone  taper to [facility] for 8 days total similar to the one he took earlier this year prescribed by Dr. Dolphus.  If symptoms do not clear up with this then we will need to contact the office again and he may need to come in to be seen. I would not recommend any more prolonged or higher dose of steroids without a clinic visit due to his recent shoulder surgery.

## 2023-10-03 DIAGNOSIS — Z4789 Encounter for other orthopedic aftercare: Secondary | ICD-10-CM | POA: Diagnosis not present

## 2023-10-12 ENCOUNTER — Other Ambulatory Visit: Payer: Self-pay | Admitting: Nurse Practitioner

## 2023-10-16 ENCOUNTER — Ambulatory Visit (HOSPITAL_COMMUNITY): Admit: 2023-10-16 | Admitting: Orthopedic Surgery

## 2023-10-16 SURGERY — ARTHROPLASTY, KNEE, TOTAL
Anesthesia: Spinal | Site: Knee | Laterality: Left

## 2023-10-19 ENCOUNTER — Other Ambulatory Visit (HOSPITAL_COMMUNITY): Payer: Self-pay

## 2023-10-23 ENCOUNTER — Encounter: Payer: Self-pay | Admitting: Cardiovascular Disease

## 2023-10-24 MED ORDER — PRAVASTATIN SODIUM 40 MG PO TABS: 40.0000 mg | ORAL_TABLET | Freq: Every day | ORAL | 3 refills | Status: AC

## 2023-10-26 ENCOUNTER — Other Ambulatory Visit (HOSPITAL_COMMUNITY): Payer: Self-pay

## 2023-11-07 DIAGNOSIS — M5412 Radiculopathy, cervical region: Secondary | ICD-10-CM | POA: Diagnosis not present

## 2023-11-19 DIAGNOSIS — M25552 Pain in left hip: Secondary | ICD-10-CM | POA: Diagnosis not present

## 2023-11-19 DIAGNOSIS — M1712 Unilateral primary osteoarthritis, left knee: Secondary | ICD-10-CM | POA: Diagnosis not present

## 2023-11-26 ENCOUNTER — Other Ambulatory Visit (HOSPITAL_COMMUNITY): Payer: Self-pay

## 2023-11-29 ENCOUNTER — Encounter: Payer: Self-pay | Admitting: Rheumatology

## 2023-11-29 MED ORDER — COLCHICINE 0.6 MG PO TABS: 0.6000 mg | ORAL_TABLET | Freq: Two times a day (BID) | ORAL | 0 refills | Status: DC | PRN

## 2023-11-29 NOTE — Telephone Encounter (Signed)
 Last Fill: 08/09/2020  Labs: 09/13/2023 CBC & BMP: hemoglobin 12.5, MCH 24.8, RDW 17.6, glucose 112, BUN 24, creatinine 1.26  Next Visit: 03/18/2024  Last Visit: 09/12/2023  DX: Idiopathic chronic gout of multiple sites without tophus   Current Dose per office note on 09/12/2023: Colchicine  0.6 mg p.o. twice daily as needed.   Okay to refill Colchicine ?

## 2023-12-14 ENCOUNTER — Other Ambulatory Visit (HOSPITAL_COMMUNITY): Payer: Self-pay

## 2023-12-14 DIAGNOSIS — G894 Chronic pain syndrome: Secondary | ICD-10-CM | POA: Diagnosis not present

## 2023-12-14 MED ORDER — OXYCODONE HCL 10 MG PO TABS
10.0000 mg | ORAL_TABLET | Freq: Three times a day (TID) | ORAL | 0 refills | Status: DC
  Filled 2023-12-26: qty 90, 30d supply, fill #0

## 2023-12-14 MED ORDER — OXYCODONE HCL 10 MG PO TABS
10.0000 mg | ORAL_TABLET | Freq: Three times a day (TID) | ORAL | 0 refills | Status: AC
  Filled 2024-02-22: qty 90, 30d supply, fill #0

## 2023-12-14 MED ORDER — CYCLOBENZAPRINE HCL 10 MG PO TABS
10.0000 mg | ORAL_TABLET | Freq: Three times a day (TID) | ORAL | 11 refills | Status: AC
  Filled 2023-12-14 – 2023-12-26 (×2): qty 90, 30d supply, fill #0
  Filled 2024-02-22: qty 90, 30d supply, fill #1

## 2023-12-14 MED ORDER — OXYCODONE HCL 10 MG PO TABS
10.0000 mg | ORAL_TABLET | Freq: Three times a day (TID) | ORAL | 0 refills | Status: AC
  Filled 2024-01-23: qty 90, 30d supply, fill #0

## 2023-12-19 DIAGNOSIS — Z96611 Presence of right artificial shoulder joint: Secondary | ICD-10-CM | POA: Diagnosis not present

## 2023-12-19 DIAGNOSIS — Z96612 Presence of left artificial shoulder joint: Secondary | ICD-10-CM | POA: Diagnosis not present

## 2023-12-25 ENCOUNTER — Other Ambulatory Visit (HOSPITAL_COMMUNITY): Payer: Self-pay

## 2023-12-26 ENCOUNTER — Other Ambulatory Visit (HOSPITAL_COMMUNITY): Payer: Self-pay

## 2024-01-06 ENCOUNTER — Encounter: Payer: Self-pay | Admitting: Cardiovascular Disease

## 2024-01-07 MED ORDER — NITROGLYCERIN 0.4 MG SL SUBL: SUBLINGUAL_TABLET | SUBLINGUAL | 6 refills | Status: AC

## 2024-01-23 ENCOUNTER — Other Ambulatory Visit (HOSPITAL_COMMUNITY): Payer: Self-pay

## 2024-01-23 ENCOUNTER — Other Ambulatory Visit: Payer: Self-pay

## 2024-02-01 ENCOUNTER — Encounter: Payer: Self-pay | Admitting: Cardiovascular Disease

## 2024-02-01 DIAGNOSIS — I25119 Atherosclerotic heart disease of native coronary artery with unspecified angina pectoris: Secondary | ICD-10-CM

## 2024-02-01 DIAGNOSIS — E782 Mixed hyperlipidemia: Secondary | ICD-10-CM

## 2024-02-04 MED ORDER — REPATHA SURECLICK 140 MG/ML ~~LOC~~ SOAJ: 1.0000 | SUBCUTANEOUS | 3 refills | Status: AC

## 2024-02-06 NOTE — Progress Notes (Signed)
 " Cardiology Office Note:  .   Date:  02/15/2024  ID:  Victor [name], DOB 1955/08/23, MRN [id number] PCP: Victor Lenis, MD  [facility] Providers Cardiologist:  Victor Passe, MD (Inactive)   History of Present Illness: .    Chief Complaint  Patient presents with   Follow-up    Victor [name] is a 68 y.o. male with below history who presents for follow-up.   History of Present Illness   Victor [name] is a 68 year old male with coronary artery disease status post CABG who presents for follow-up.  He experiences occasional chest discomfort, described as a 'twinge' or pressure, particularly during exercise. He has not undergone a stress test since the surgery.  He has a history of hypertension and hyperlipidemia. His cholesterol levels are well-controlled with an LDL of 30, managed with Repatha  and pravastatin  40 mg. His blood pressure is well-controlled with metoprolol  and amlodipine.  He experiences lightheadedness and a 'funny' feeling in his chest after taking Sudafed and consuming extra coffee, especially when overheating during exercise. He also has a history of back pain that has previously caused heart fluttering sensations.  He is planning to undergo knee surgery and requires authorization for the procedure. He has recently received a COVID shot and a pneumonia shot. He is currently taking aspirin  325 mg.  In terms of physical activity, he resumed going to the gym about three weeks ago, focusing on leg exercises due to limitations from shoulder surgery and knee issues. He experiences difficulty walking due to knee, hip, and back pain, and sometimes needs to stop to catch his breath. No swelling in legs.           Problem List CAD s/p CABG -CABG x 5 05/19/2022 HLD -T chol 1103, HDL 47, LDL 30, TG 81 HTN OSA    ROS: All other ROS reviewed and negative. Pertinent positives noted in the HPI.     Studies Reviewed: SABRA       TTE  03/07/2022  1. Left ventricular ejection fraction, by estimation, is 60 to 65%. The  left ventricle has normal function. The left ventricle has no regional  wall motion abnormalities. There is mild left ventricular hypertrophy.  Left ventricular diastolic parameters  are consistent with Grade I diastolic dysfunction (impaired relaxation).   2. Right ventricular systolic function is normal. The right ventricular  size is normal.   3. The mitral valve is normal in structure. No evidence of mitral valve  regurgitation. No evidence of mitral stenosis.   4. The aortic valve is tricuspid. Aortic valve regurgitation is mild. No  aortic stenosis is present. Aortic regurgitation PHT measures 701 msec.   5. Aortic dilatation noted. There is mild dilatation of the aortic root,  measuring 42 mm. There is mild dilatation of the ascending aorta,  measuring 43 mm.   6. The inferior vena cava is normal in size with greater than 50%  respiratory variability, suggesting right atrial pressure of 3 mmHg.   Physical Exam:   VS:  BP 128/80   Pulse 74   Ht 5' 7 (1.702 m)   Wt 224 lb 6.4 oz (101.8 kg)   SpO2 98%   BMI 35.15 kg/m    Wt Readings from Last 3 Encounters:  02/15/24 224 lb 6.4 oz (101.8 kg)  09/13/23 222 lb 10.6 oz (101 kg)  09/12/23 222 lb 12.8 oz (101.1 kg)    GEN: Well nourished, well developed in no acute distress  NECK: No JVD; No carotid bruits CARDIAC: RRR, no murmurs, rubs, gallops RESPIRATORY:  Clear to auscultation without rales, wheezing or rhonchi  ABDOMEN: Soft, non-tender, non-distended EXTREMITIES:  No edema; No deformity  ASSESSMENT AND PLAN: .   Assessment and Plan    Coronary artery disease, status post coronary artery bypass grafting (CABG) Precordial pain Status post CABG in March 2024. Occasional chest pain likely musculoskeletal. Lightheadedness and chest discomfort with exertion and heat exposure. Stress test needed to assess blood flow and heart function. - Ordered  nuclear medicine stress test. - Reduced aspirin  to 81 mg daily. - Scheduled follow-up in one year or sooner if symptoms change.  Hypertension Well controlled with current medication regimen. Blood pressure at goal. - Continue amlodipine 5 mg daily. - Continue metoprolol  12.5 mg BID.  Hyperlipidemia Well managed with current treatment. LDL at goal of 30 mg/dL. - Continue pravastatin  40 mg daily. - Continue Repatha .   Preoperative Assessment - nuclear stress test given symptoms of CP.            Informed Consent   Shared Decision Making/Informed Consent The risks [chest pain, shortness of breath, cardiac arrhythmias, dizziness, blood pressure fluctuations, myocardial infarction, stroke/transient ischemic attack, nausea, vomiting, allergic reaction, radiation exposure, metallic taste sensation and life-threatening complications (estimated to be 1 in 10,000)], benefits (risk stratification, diagnosing coronary artery disease, treatment guidance) and alternatives of a nuclear stress test were discussed in detail with Victor [name] and he agrees to proceed.      Follow-up: Return in about 1 year (around 02/14/2025).  Signed, Victor [name]. Barbaraann, MD, [facility]  [facility]  [address] [place], [place] [address] [phone]  9:59 AM   "

## 2024-02-15 ENCOUNTER — Ambulatory Visit: Attending: Cardiovascular Disease | Admitting: Cardiovascular Disease

## 2024-02-15 ENCOUNTER — Encounter: Payer: Self-pay | Admitting: Cardiovascular Disease

## 2024-02-15 VITALS — BP 128/80 | HR 74 | Ht 67.0 in | Wt 224.4 lb

## 2024-02-15 DIAGNOSIS — R072 Precordial pain: Secondary | ICD-10-CM | POA: Diagnosis not present

## 2024-02-15 DIAGNOSIS — Z951 Presence of aortocoronary bypass graft: Secondary | ICD-10-CM | POA: Diagnosis not present

## 2024-02-15 DIAGNOSIS — E782 Mixed hyperlipidemia: Secondary | ICD-10-CM | POA: Diagnosis not present

## 2024-02-15 DIAGNOSIS — I25810 Atherosclerosis of coronary artery bypass graft(s) without angina pectoris: Secondary | ICD-10-CM

## 2024-02-15 DIAGNOSIS — R079 Chest pain, unspecified: Secondary | ICD-10-CM

## 2024-02-15 DIAGNOSIS — Z01810 Encounter for preprocedural cardiovascular examination: Secondary | ICD-10-CM

## 2024-02-15 DIAGNOSIS — I10 Essential (primary) hypertension: Secondary | ICD-10-CM | POA: Diagnosis not present

## 2024-02-15 MED ORDER — ASPIRIN EC 81 MG PO TBEC: 81.0000 mg | DELAYED_RELEASE_TABLET | Freq: Every day | ORAL | 3 refills | Status: AC

## 2024-02-15 NOTE — Patient Instructions (Signed)
 Medication Instructions:   START  TAKING : ASPIRIN   81 MG  ONCE  A  DAY     *If you need a refill on your cardiac medications before your next appointment, please call your pharmacy*   Lab Work: NONE ORDERED  TODAY    If you have labs (blood work) drawn today and your tests are completely normal, you will receive your results only by: MyChart Message (if you have MyChart) OR A paper copy in the mail If you have any lab test that is abnormal or we need to change your treatment, we will call you to review the results.  Testing/Procedures: Your physician has requested that you have a lexiscan  myoview. For further information please visit [email or web address] Please follow instruction sheet, as given.    Follow-Up: At [facility], you and your health needs are our priority.  As part of our continuing mission to provide you with exceptional heart care, our providers are all part of one team.  This team includes your primary Cardiologist (physician) and Advanced Practice Providers or APPs (Physician Assistants and Nurse Practitioners) who all work together to provide you with the care you need, when you need it.  Your next appointment:   1 year(s)  Provider:   One of our Advanced Practice Providers (APPs): Morse Clause, PA-C  Lamarr Satterfield, NP Miriam Shams, NP  Olivia Pavy, PA-C Josefa Beauvais, NP  Leontine Salen, PA-C Orren Fabry, PA-C  [place], PA-C Ernest Dick, NP  Damien Braver, NP Jon Hails, PA-C  Waddell Donath, PA-C    Dayna Dunn, PA-C  Scott Weaver, PA-C Lum Louis, NP Katlyn West, NP Callie Goodrich, PA-C  Xika Zhao, NP Sheng Haley, PA-C    Kathleen Johnson, PA-C     We recommend signing up for the patient portal called MyChart.  Sign up information is provided on this After Visit Summary.  MyChart is used to connect with patients for Virtual Visits (Telemedicine).  Patients are able to view lab/test results, encounter notes, upcoming  appointments, etc.  Non-urgent messages can be sent to your provider as well.   To learn more about what you can do with MyChart, go to [email or web address].   Other Instructions

## 2024-02-16 ENCOUNTER — Other Ambulatory Visit: Payer: Self-pay | Admitting: Rheumatology

## 2024-02-18 ENCOUNTER — Encounter (HOSPITAL_COMMUNITY): Payer: Self-pay

## 2024-02-18 ENCOUNTER — Ambulatory Visit (HOSPITAL_COMMUNITY)
Admission: RE | Admit: 2024-02-18 | Discharge: 2024-02-18 | Disposition: A | Discharge status: Home / Self Care | Source: Ambulatory Visit | Attending: Family Medicine | Admitting: Family Medicine

## 2024-02-18 VITALS — BP 144/88 | HR 76 | Temp 98.1°F | Resp 18

## 2024-02-18 DIAGNOSIS — J019 Acute sinusitis, unspecified: Secondary | ICD-10-CM

## 2024-02-18 MED ORDER — DEXAMETHASONE SOD PHOSPHATE PF 10 MG/ML IJ SOLN
10.0000 mg | Freq: Once | INTRAMUSCULAR | Status: AC
  Administered 2024-02-18: 10 mg via INTRAMUSCULAR

## 2024-02-18 MED ORDER — CEFDINIR 300 MG PO CAPS: 600.0000 mg | ORAL_CAPSULE | Freq: Every day | ORAL | 0 refills | Status: AC

## 2024-02-18 MED ORDER — BENZONATATE 100 MG PO CAPS: 100.0000 mg | ORAL_CAPSULE | Freq: Three times a day (TID) | ORAL | 0 refills | Status: AC | PRN

## 2024-02-18 NOTE — ED Provider Notes (Signed)
 " [facility]    CSN: [id number] Arrival date & time: 02/18/24  1136      History   Chief Complaint Chief Complaint  Patient presents with   Headache    Discolored discharge from nose and pressure above the rues - Entered by patient   Facial Pain    HPI Victor [name] is a 68 y.o. male.    Headache  Here for headache and nasal congestion and rhinorrhea and postnasal drainage.  Symptoms began 7 or 8 days ago.  No fever or chills at any point.  No nausea vomiting or diarrhea.  The headache is frontal over his eyes.  Doxycycline and amoxicillin caused stomach trouble, but do not cause a rash.  Past medical history is negative for diabetes, though he does have prediabetes.  Past Medical History:  Diagnosis Date   Allergic rhinitis    Anemia    Anxiety    Arteriosclerosis of thoracic aorta    Arthritis    all over (04/15/2013)   Back pain, chronic    BMI 32.0-32.9,adult    Cervicalgia    Chronic neck pain    Coronary artery disease 2023   COVID-19    DDD (degenerative disc disease), cervical    DDD (degenerative disc disease), lumbar    Diverticulosis    Elevated hemoglobin    Fibromyalgia    GERD (gastroesophageal reflux disease)    Gout    Headache    High cholesterol    History of bronchitis    Hyperkalemia    Hypertension    Insomnia    Kidney disease with benign hypertension    Leg cramps    Right greater than left   Low testosterone     Melanoma (HCC)    Nose   MRSA infection    Neuropathy of both feet    Peripheral vascular disease    Personal history of colonic polyps    Pneumonia    Pre-diabetes    Radiculopathy of lumbar region    Raynaud phenomenon    Recurrent sinus infections    Right sided sciatica    Sinus infection 09/05/2023   Sleep apnea    don't use CPAP (04/15/2013)   Tobacco dependence     Patient Active Problem List   Diagnosis Date Noted   S/P reverse total shoulder arthroplasty, right 09/20/2023    S/P reverse total shoulder arthroplasty, left 11/30/2022   S/P CABG x 5 05/19/2022   Coronary artery disease involving native coronary artery of native heart with angina pectoris 02/03/2022   Degenerative spondylolisthesis 05/31/2021   Cervical radiculopathy 05/24/2020   Hypertension    Neuropathy of both feet    Lumbar foraminal stenosis 08/06/2017   HNP (herniated nucleus pulposus), thoracic 09/05/2016   Chronic kidney disease (CKD) 04/26/2016   DJD (degenerative joint disease), cervical 04/26/2016   Essential hypertension 04/26/2016   Hyperlipidemia 04/26/2016   Sleep apnea 04/26/2016   Fibromyalgia 04/18/2016   Idiopathic chronic gout of multiple sites without tophus 04/18/2016   Spondylosis of lumbar region without myelopathy or radiculopathy 04/18/2016   Primary osteoarthritis of both knees 04/18/2016   Lumbar herniated disc 07/22/2015   Morbid obesity (HCC) 02/25/2014   S/P right TKA 02/24/2014   Heartburn 01/10/2011    Past Surgical History:  Procedure Laterality Date   ANTERIOR CERVICAL DECOMP/DISCECTOMY FUSION  2000's X3   had 3 surgeries on my front neck (04/15/2013)   ANTERIOR LAT LUMBAR FUSION Left 08/06/2017   Procedure: LEFT LUMBAR  TWO-THREE ANTERIOR LATERAL LUMBAR FUSION WITH PERCUTANEOUS SCREWS;  Surgeon: Louis Shove, MD;  Location: [facility] OR;  Service: Neurosurgery;  Laterality: Left;   APPENDECTOMY  ~ 2010   BACK SURGERY  05/31/2021   Lumbar fusion L4-L5 L5-S1 with Dr. Louis CARRY TUNNEL RELEASE Left 1990's   CATARACT EXTRACTION, BILATERAL     COLONOSCOPY     CORONARY ARTERY BYPASS GRAFT N/A 05/19/2022   Procedure: CORONARY ARTERY BYPASS GRAFTING (CABG) X FIVE BYPASSES USING OPEN LEFT INTERNAL MAMMARY ARTERY AND ENDOSCOPIC LEFT GREATER SAPHENOUS VEIN HARVEST;  Surgeon: Lucas Dorise POUR, MD;  Location: [facility];  Service: Open Heart Surgery;  Laterality: N/A;   CORONARY PRESSURE/FFR STUDY N/A 02/03/2022   Procedure: INTRAVASCULAR PRESSURE WIRE/FFR STUDY;  Surgeon:  Verlin Lonni BIRCH, MD;  [facility]: [facility];  Service: Cardiovascular;  Laterality: N/A;   ear drum surgery  1974   EYE SURGERY Right    FLEXIBLE SIGMOIDOSCOPY  12/21/2011   Procedure: FLEXIBLE SIGMOIDOSCOPY;  Surgeon: Jerrell KYM Sol, MD;  Location: [facility];  Service: Endoscopy;  Laterality: N/A;   HAND TENDON SURGERY Left ~ 1978 X2-1980's X3   had 5 ORs after laceration   HOT HEMOSTASIS  12/21/2011   Procedure: HOT HEMOSTASIS (ARGON PLASMA COAGULATION/BICAP);  Surgeon: Jerrell KYM Sol, MD;  Location: [facility];  Service: Endoscopy;  Laterality: N/A;   INGUINAL HERNIA REPAIR Left 04/15/2013   Procedure: LAPAROSCOPIC REPAIR OF INCARCERATED LEFT INGUINAL HERNIA;  Surgeon: Camellia CHRISTELLA Blush, MD;  Location: [facility] OR;  Service: General;  Laterality: Left;   INGUINAL HERNIA REPAIR Bilateral 1970's - 1984   INGUINAL HERNIA REPAIR Left 04/15/2013   INSERTION OF MESH Left 04/15/2013   Procedure: INSERTION OF MESH;  Surgeon: Camellia CHRISTELLA Blush, MD;  Location: [facility];  Service: General;  Laterality: Left;   JOINT REPLACEMENT     right knee   KNEE ARTHROSCOPY Right 1990's   LAPAROSCOPY N/A 04/15/2013   Procedure: LAPAROSCOPY DIAGNOSTIC;  Surgeon: Camellia CHRISTELLA Blush, MD;  Location: [facility] OR;  Service: General;  Laterality: N/A;   LEFT HEART CATH AND CORONARY ANGIOGRAPHY N/A 02/03/2022   Procedure: LEFT HEART CATH AND CORONARY ANGIOGRAPHY;  Surgeon: Verlin Lonni BIRCH, MD;  [facility];  Service: Cardiovascular;  Laterality: N/A;   LUMBAR LAMINECTOMY/DECOMPRESSION MICRODISCECTOMY Right 07/22/2015   Procedure: Right Lumbar Two-Three Microdiscectomy;  Surgeon: Shove Louis, MD;  Location: [facility];  Service: Neurosurgery;  Laterality: Right;   LUMBAR LAMINECTOMY/DECOMPRESSION MICRODISCECTOMY Right 09/05/2016   Procedure: Microdiscectomy - right - T12-L1;  Surgeon: Louis Shove, MD;  Location: [facility] OR;  Service: Neurosurgery;  Laterality: Right;   NASAL SEPTUM SURGERY  Bilateral 2014   removed polyps & infection   POLYPECTOMY  01/10/2011   Procedure: POLYPECTOMY;  Surgeon: Jerrell KYM Sol, MD;  Location: [facility];  Service: Endoscopy;  Laterality: N/A;   POSTERIOR CERVICAL LAMINECTOMY Left 05/24/2020   Procedure: Laminectomy and Foraminotomy - left - Cervical two-Cervical three;  Surgeon: Louis Shove, MD;  Location: [facility] OR;  Service: Neurosurgery;  Laterality: Left;   POSTERIOR LUMBAR FUSION  11/1995   ray cages (04/15/2013)   SHOULDER ARTHROSCOPY Bilateral    twice each   TEE WITHOUT CARDIOVERSION N/A 05/19/2022   Procedure: TRANSESOPHAGEAL ECHOCARDIOGRAM;  Surgeon: Lucas Dorise POUR, MD;  Location: [facility] OR;  Service: Open Heart Surgery;  Laterality: N/A;   TONSILLECTOMY  1960's   TOTAL KNEE ARTHROPLASTY Right 02/24/2014   Procedure: TOTAL RIGHT KNEE ARTHROPLASTY;  Surgeon: Donnice BIRCH Car, MD;  Location: [facility];  Service: Orthopedics;  Laterality: Right;   TOTAL SHOULDER ARTHROPLASTY Left 09/18/2019   Procedure: TOTAL SHOULDER ARTHROPLASTY;  Surgeon: Melita Drivers, MD;  Location: [facility];  Service: Orthopedics;  Laterality: Left;    TOTAL SHOULDER ARTHROPLASTY Right 11/04/2020   Procedure: TOTAL SHOULDER ARTHROPLASTY;  Surgeon: Melita Drivers, MD;  Location: [facility];  Service: Orthopedics;  Laterality: Right;    TOTAL SHOULDER REVISION Left 11/30/2022   Procedure: Removal Left anatomic shoulder arthroplasty and conversion to Reverse shoulder arthroplasty;  Surgeon: Melita Drivers, MD;  Location: [facility];  Service: Orthopedics;  Laterality: Left;    TOTAL SHOULDER REVISION Right 09/20/2023   Procedure: REVISION, TOTAL ARTHROPLASTY, SHOULDER;  Surgeon: Melita Drivers, MD;  Location: [facility];  Service: Orthopedics;  Laterality: Right;  conversion Right Anatomic to reverse arthroplasty   UPPER GI ENDOSCOPY     VASECTOMY  1988   WISDOM TOOTH EXTRACTION  2000's       Home Medications    Prior to Admission medications  Medication Sig  Start Date End Date Taking? Authorizing Provider  benzonatate  (TESSALON ) 100 MG capsule Take 1 capsule (100 mg total) by mouth 3 (three) times daily as needed for cough. 02/18/24  Yes Vonna Sharlet POUR, MD  cefdinir  (OMNICEF ) 300 MG capsule Take 2 capsules (600 mg total) by mouth daily for 7 days. 02/18/24 02/25/24 Yes Vonna Sharlet POUR, MD  acetaminophen  (TYLENOL ) 500 MG tablet Take 500-1,000 mg by mouth every 8 (eight) hours as needed for moderate pain.    [provider]  albuterol  (VENTOLIN  HFA) 108 (90 Base) MCG/ACT inhaler Inhale 1 puff into the lungs every 4 (four) hours as needed for wheezing or shortness of breath. 02/08/24   [provider]  allopurinol  (ZYLOPRIM ) 300 MG tablet TAKE 1 AND 1/2 TABLETS DAILY BY MOUTH 09/07/23   Cheryl Waddell HERO, PA-C  ALPRAZolam  (XANAX ) 1 MG tablet Take 1 mg by mouth 2 (two) times daily as needed for anxiety.    [provider]  amLODipine (NORVASC) 5 MG tablet Take 5 mg by mouth in the morning. 03/02/22   [provider]  aspirin  EC 81 MG tablet Take 1 tablet (81 mg total) by mouth daily. 02/15/24   O'NealDarryle Ned, MD  b complex vitamins tablet Take 1 tablet by mouth in the morning.    [provider]  Biotin  5000 MCG CAPS Take 5,000 mcg by mouth in the morning.    [provider]  Calcium  Carbonate (CALCIUM  600 PO) Take 600 mg by mouth in the morning.    [provider]  carboxymethylcellulose (REFRESH PLUS) 0.5 % SOLN Place 1 drop into both eyes 2 (two) times daily as needed (dry/irritated eyes.).    [provider]  clindamycin  (CLEOCIN ) 300 MG capsule Take 2 capsules (600 mg total) by mouth 1-2 hours prior to dental work. Patient taking differently: Take 600 mg by mouth. For dental procedures 01/29/23     colchicine  0.6 MG tablet TAKE 1 TABLET (0.6 MG TOTAL) BY MOUTH 2 (TWO) TIMES DAILY AS NEEDED. 02/18/24   Dolphus Reiter, MD  cyclobenzaprine  (FLEXERIL ) 10 MG tablet Take 1  tablet (10 mg total) by mouth 3 (three) times daily. 12/14/23     Evolocumab  (REPATHA  SURECLICK) 140 MG/ML SOAJ Inject 140 mg into the skin every 14 (fourteen) days. 02/04/24   Wyn Jackee VEAR Mickey., NP  fish oil-omega-3 fatty acids  1000 MG capsule Take 1 g by mouth in the morning and at  bedtime.    [provider]  fluticasone  (FLONASE ) 50 MCG/ACT nasal spray Place 1 spray into both nostrils every evening. For congestion 04/01/13   [provider]  GINSENG PO Take 400 mg by mouth in the morning.    [provider]  guaiFENesin  (MUCINEX ) 600 MG 12 hr tablet Take 600 mg by mouth at bedtime as needed (Congestion).    [provider]  hydrocortisone cream 1 % Apply 1 application topically daily as needed for itching.    [provider]  HYDROmorphone  (DILAUDID ) 4 MG tablet Take 1 tablet (4 mg total) by mouth every 4 (four) hours as needed. 09/20/23   Shuford, Randine, PA-C  ibuprofen  (ADVIL ) 200 MG tablet Take 200 mg by mouth daily as needed (pain.).    [provider]  magnesium  gluconate (MAGONATE) 500 MG tablet Take 500 mg by mouth in the morning.    [provider]  Melatonin 10 MG TABS Take 10 mg by mouth at bedtime as needed (sleep).    [provider]  Methylsulfonylmethane (MSM) 1000 MG CAPS Take 1,000 mg by mouth in the morning.    [provider]  metoprolol  tartrate (LOPRESSOR ) 25 MG tablet TAKE 0.5 TABLETS BY MOUTH 2 TIMES DAILY. 10/12/23   Croitoru, Mihai, MD  Multiple Vitamins-Minerals (MULTIVITAMIN WITH MINERALS) tablet Take 1 tablet by mouth in the morning.    [provider]  nitroGLYCERIN  (NITROSTAT ) 0.4 MG SL tablet Dissolve 1 tablet under the tongue every 5 minutes as needed for chest pain. Max of 3 doses, then 911. 01/07/24   O'Neal, Darryle Ned, MD  omeprazole (PRILOSEC) 40 MG capsule Take 40 mg by mouth in the morning.    [provider]  ondansetron  (ZOFRAN ) 4 MG tablet Take 1 tablet (4 mg  total) by mouth every 8 (eight) hours as needed for nausea or vomiting. 09/20/23   Shuford, Randine, PA-C  Oxycodone  HCl 10 MG TABS Take 1 tablet (10 mg total) by mouth every 8 (eight) hours. 01/23/24     pravastatin  (PRAVACHOL ) 40 MG tablet Take 1 tablet (40 mg total) by mouth daily. 10/24/23   Swinyer, Rosaline HERO, NP  Psyllium (METAMUCIL FIBER PO) Take 1 Scoop by mouth daily as needed (regularity).    [provider]  selenium  200 MCG TABS tablet Take 200 mcg by mouth in the morning.    [provider]  sildenafil (VIAGRA) 100 MG tablet Take 50-100 mg by mouth daily as needed for erectile dysfunction.    [provider]  silodosin (RAPAFLO) 8 MG CAPS capsule Take 8 mg by mouth at bedtime. 02/11/16   [provider]  sodium chloride  (OCEAN) 0.65 % SOLN nasal spray Place 1 spray into both nostrils as needed for congestion.    [provider]  testosterone  cypionate (DEPOTESTOTERONE CYPIONATE) 200 MG/ML injection Inject 100 mg into the muscle every 14 (fourteen) days. 0.5 mL 05/22/12   [provider]  Turmeric 500 MG CAPS Take 500 mg by mouth in the morning.    [provider]  venlafaxine  (EFFEXOR -XR) 150 MG 24 hr capsule Take 150 mg by mouth daily with breakfast.    [provider]  Vitamin D3 (VITAMIN D ) 25 MCG tablet Take 1,000 Units by mouth in the morning.    [provider]  vitamin E  400 UNIT capsule Take 400 Units by mouth in the morning.    [provider]    Family History Family History  Problem Relation Age of  Onset   Hypertension Father    Cancer Mother        breast    Heart Problems Mother    Alzheimer's disease Mother    Allergies Daughter    Interstitial cystitis Daughter     Social History Social History[1]   Allergies   Tetracyclines & related, Amoxicillin, Crestor  [rosuvastatin ], Doxycycline, and Niacin and related   Review of Systems Review of Systems  Neurological:   Positive for headaches.     Physical Exam Triage Vital Signs ED Triage Vitals  Encounter Vitals Group     BP 02/18/24 1203 (!) 144/88     Girls Systolic BP Percentile --      Girls Diastolic BP Percentile --      Boys Systolic BP Percentile --      Boys Diastolic BP Percentile --      Pulse Rate 02/18/24 1203 76     Resp 02/18/24 1203 18     Temp 02/18/24 1203 98.1 F (36.7 C)     Temp Source 02/18/24 1203 Oral     SpO2 02/18/24 1203 95 %     Weight --      Height --      Head Circumference --      Peak Flow --      Pain Score 02/18/24 1201 6     Pain Loc --      Pain Education --      Exclude from Growth Chart --    No data found.  Updated Vital Signs BP (!) 144/88 (BP Location: Left Arm)   Pulse 76   Temp 98.1 F (36.7 C) (Oral)   Resp 18   SpO2 95%   Visual Acuity Right Eye Distance:   Left Eye Distance:   Bilateral Distance:    Right Eye Near:   Left Eye Near:    Bilateral Near:     Physical Exam Vitals reviewed.  Constitutional:      General: He is not in acute distress.    Appearance: He is not ill-appearing, toxic-appearing or diaphoretic.  HENT:     Right Ear: Tympanic membrane and ear canal normal.     Left Ear: Tympanic membrane and ear canal normal.     Nose: Congestion present.     Mouth/Throat:     Mouth: Mucous membranes are moist.     Comments: There is a lot of white and yellow postnasal discharge draining in the posterior oropharynx.  There is some mild erythema of the uvula and of the tonsillar pillars.  No tonsillar hypertrophy.  No asymmetry and uvula is in the midline Eyes:     Extraocular Movements: Extraocular movements intact.     Conjunctiva/sclera: Conjunctivae normal.     Pupils: Pupils are equal, round, and reactive to light.  Cardiovascular:     Rate and Rhythm: Normal rate and regular rhythm.     Heart sounds: No murmur heard. Pulmonary:     Effort: Pulmonary effort is normal. No respiratory distress.     Breath sounds:  Normal breath sounds. No stridor. No wheezing, rhonchi or rales.  Musculoskeletal:     Cervical back: Neck supple.  Lymphadenopathy:     Cervical: No cervical adenopathy.  Skin:    Capillary Refill: Capillary refill takes less than 2 seconds.     Coloration: Skin is not jaundiced or pale.  Neurological:     General: No focal deficit present.     Mental Status: He is alert and  oriented to person, place, and time.  Psychiatric:        Behavior: Behavior normal.      UC Treatments / Results  Labs (all labs ordered are listed, but only abnormal results are displayed) Labs Reviewed - No data to display  EKG   Radiology No results found.  Procedures Procedures (including critical care time)  Medications Ordered in UC Medications  dexamethasone  (DECADRON ) injection 10 mg (has no administration in time range)    Initial Impression / Assessment and Plan / UC Course  I have reviewed the triage vital signs and the nursing notes.  Pertinent labs & imaging results that were available during my care of the patient were reviewed by me and considered in my medical decision making (see chart for details).     Omnicef  is sent in for the sinusitis, and Tessalon  Perles are sent in for the cough.  Decadron  is given here to help with the swelling in his nasal passages and sinuses. Final Clinical Impressions(s) / UC Diagnoses   Final diagnoses:  Acute sinusitis, recurrence not specified, unspecified location     Discharge Instructions      Take cefdinir  300 mg--2 capsules together daily for 7 days  Take benzonatate  100 mg, 1 tab every 8 hours as needed for cough.  You have been given a shot of dexamethasone  10 mg, steroid.       ED Prescriptions     Medication Sig Dispense Auth. Provider   cefdinir  (OMNICEF ) 300 MG capsule Take 2 capsules (600 mg total) by mouth daily for 7 days. 14 capsule Tricha Ruggirello, Sharlet POUR, MD   benzonatate  (TESSALON ) 100 MG capsule Take 1 capsule  (100 mg total) by mouth 3 (three) times daily as needed for cough. 21 capsule Signe Tackitt K, MD      I have reviewed the PDMP during this encounter.    [1]  Social History Tobacco Use   Smoking status: Former    Current packs/day: 0.00    Average packs/day: 0.1 packs/day for 15.0 years (1.5 ttl pk-yrs)    Types: Cigarettes, Cigars    Start date: 01/09/1995    Quit date: 01/08/2010    Years since quitting: 14.1    Passive exposure: Past   Smokeless tobacco: Former    Types: Chew   Tobacco comments:    04/15/2013 smoked ~ 1 pack/month  Vaping Use   Vaping status: Never Used  Substance Use Topics   Alcohol  use: Not Currently    Alcohol /week: 2.0 standard drinks of alcohol     Types: 2 Standard drinks or equivalent per week    Comment: occasional   Drug use: No     Vonna Sharlet POUR, MD 02/18/24 1251  "

## 2024-02-18 NOTE — Telephone Encounter (Signed)
 Last Fill: 11/29/2023  Labs: 09/13/2023 hemoglobin 12.5, MCH 24.8, RDW 17.6, glucose 112, BUN 24, creatinine 1.26  Next Visit: 03/18/2024  Last Visit: 09/12/2023  DX: Idiopathic chronic gout of multiple sites without tophus   Current Dose per office note on 09/12/2023: Colchicine  0.6 mg p.o. twice daily as needed.   Okay to refill Colchicine ?

## 2024-02-18 NOTE — Discharge Instructions (Signed)
 Take cefdinir 300 mg--2 capsules together daily for 7 days  Take benzonatate 100 mg, 1 tab every 8 hours as needed for cough.  You have been given a shot of dexamethasone 10 mg, steroid.

## 2024-02-18 NOTE — ED Triage Notes (Signed)
 Pt has c/o sinus pressure and headache that started 1 week ago. Pt has been taking mucinex , tylenol  nyquil, and flonase  with [place] relief. Last dose of medication 830 am

## 2024-02-22 ENCOUNTER — Encounter: Payer: Self-pay | Admitting: Cardiovascular Disease

## 2024-02-22 ENCOUNTER — Other Ambulatory Visit (HOSPITAL_COMMUNITY): Payer: Self-pay

## 2024-02-25 ENCOUNTER — Other Ambulatory Visit (HOSPITAL_COMMUNITY): Payer: Self-pay

## 2024-03-04 NOTE — Progress Notes (Signed)
 "  Office Visit Note  Patient: Victor [name]             Date of Birth: [birth year]           MRN: [id number]             PCP: Seabron Lenis, MD Referring: Seabron Lenis, MD Visit Date: 03/18/2024 Occupation: Data Unavailable  Subjective:  Pain in multiple joints  History of Present Illness: Victor [name] is a 69 y.o. male with gout and osteoarthritis.  He returns today after his last visit on September 12, 2023.  He reports mild flares of gout in his hands and his feet since the last visit.  He took colchicine  as needed.  His last flare was last week.  He remains on allopurinol  450 mg daily along with colchicine  0.6 mg twice daily as needed.  He also has underlying osteoarthritis and fibromyalgia which causes ongoing discomfort. He is to have pain and discomfort in almost all of his joints.  He describes discomfort in his neck, lower back, shoulders, hands, knees and his feet.  He has bilateral shoulder replacements and right knee replacements are doing okay with the intermittent discomfort.   Activities of Daily Living:  Patient reports morning stiffness for 15 minutes.   Patient Reports nocturnal pain.  Difficulty dressing/grooming: Reports Difficulty climbing stairs: Denies Difficulty getting out of chair: Denies Difficulty using hands for taps, buttons, cutlery, and/or writing: Reports  Review of Systems  Constitutional:  Positive for fatigue.  HENT:  Positive for mouth dryness. Negative for mouth sores.   Eyes:  Negative for dryness.  Respiratory:  Negative for shortness of breath.   Cardiovascular:  Negative for chest pain and palpitations.  Gastrointestinal:  Positive for constipation and diarrhea. Negative for blood in stool.  Endocrine: Negative for increased urination.  Genitourinary:  Positive for difficulty urinating.  Musculoskeletal:  Positive for joint pain, gait problem, joint pain, joint swelling, myalgias, muscle weakness, morning stiffness, muscle  tenderness and myalgias.  Skin:  Negative for color change, rash, hair loss and sensitivity to sunlight.  Allergic/Immunologic: Positive for susceptible to infections.  Neurological:  Positive for headaches. Negative for dizziness.  Hematological:  Negative for swollen glands.  Psychiatric/Behavioral:  Positive for sleep disturbance. Negative for depressed mood. The patient is not nervous/anxious.     PMFS History:  Patient Active Problem List   Diagnosis Date Noted   S/P reverse total shoulder arthroplasty, right 09/20/2023   S/P reverse total shoulder arthroplasty, left 11/30/2022   S/P CABG x 5 05/19/2022   Coronary artery disease involving native coronary artery of native heart with angina pectoris 02/03/2022   Degenerative spondylolisthesis 05/31/2021   Cervical radiculopathy 05/24/2020   Hypertension    Neuropathy of both feet    Lumbar foraminal stenosis 08/06/2017   HNP (herniated nucleus pulposus), thoracic 09/05/2016   Chronic kidney disease (CKD) 04/26/2016   DJD (degenerative joint disease), cervical 04/26/2016   Essential hypertension 04/26/2016   Hyperlipidemia 04/26/2016   Sleep apnea 04/26/2016   Fibromyalgia 04/18/2016   Idiopathic chronic gout of multiple sites without tophus 04/18/2016   Spondylosis of lumbar region without myelopathy or radiculopathy 04/18/2016   Primary osteoarthritis of both knees 04/18/2016   Lumbar herniated disc 07/22/2015   Morbid obesity (HCC) 02/25/2014   S/P right TKA 02/24/2014   Heartburn 01/10/2011    Past Medical History:  Diagnosis Date   Allergic rhinitis    Anemia    Anxiety    Arteriosclerosis of thoracic  aorta    Arthritis    all over (04/15/2013)   Back pain, chronic    BMI 32.0-32.9,adult    Cervicalgia    Chronic neck pain    Coronary artery disease 2023   COVID-19    DDD (degenerative disc disease), cervical    DDD (degenerative disc disease), lumbar    Diverticulosis    Elevated hemoglobin     Fibromyalgia    GERD (gastroesophageal reflux disease)    Gout    Headache    High cholesterol    History of bronchitis    Hyperkalemia    Hypertension    Insomnia    Kidney disease with benign hypertension    Leg cramps    Right greater than left   Low testosterone     Melanoma (HCC)    Nose   MRSA infection    Neuropathy of both feet    Peripheral vascular disease    Personal history of colonic polyps    Pneumonia    Pre-diabetes    Radiculopathy of lumbar region    Raynaud phenomenon    Recurrent sinus infections    Right sided sciatica    Sinus infection 09/05/2023   Sleep apnea    don't use CPAP (04/15/2013)   Tobacco dependence     Family History  Problem Relation Age of Onset   Cancer Mother        breast    Heart Problems Mother    Alzheimer's disease Mother    Hypertension Father    Allergies Daughter    Interstitial cystitis Daughter    Past Surgical History:  Procedure Laterality Date   ANTERIOR CERVICAL DECOMP/DISCECTOMY FUSION  2000's X3   had 3 surgeries on my front neck (04/15/2013)   ANTERIOR LAT LUMBAR FUSION Left 08/06/2017   Procedure: LEFT LUMBAR TWO-THREE ANTERIOR LATERAL LUMBAR FUSION WITH PERCUTANEOUS SCREWS;  Surgeon: Louis Shove, MD;  Location: [facility] OR;  Service: Neurosurgery;  Laterality: Left;   APPENDECTOMY  ~ 2010   BACK SURGERY  05/31/2021   Lumbar fusion L4-L5 L5-S1 with Dr. Louis CARRY TUNNEL RELEASE Left 1990's   CATARACT EXTRACTION, BILATERAL     COLONOSCOPY     CORONARY ARTERY BYPASS GRAFT N/A 05/19/2022   Procedure: CORONARY ARTERY BYPASS GRAFTING (CABG) X FIVE BYPASSES USING OPEN LEFT INTERNAL MAMMARY ARTERY AND ENDOSCOPIC LEFT GREATER SAPHENOUS VEIN HARVEST;  Surgeon: Lucas Dorise POUR, MD;  Location: [facility] OR;  Service: Open Heart Surgery;  Laterality: N/A;   CORONARY PRESSURE/FFR STUDY N/A 02/03/2022   Procedure: INTRAVASCULAR PRESSURE WIRE/FFR STUDY;  Surgeon: Verlin Lonni BIRCH, MD;  Location: [facility];   Service: Cardiovascular;  Laterality: N/A;   ear drum surgery  1974   EYE SURGERY Right    FLEXIBLE SIGMOIDOSCOPY  12/21/2011   Procedure: FLEXIBLE SIGMOIDOSCOPY;  Surgeon: Jerrell KYM Sol, MD;  Location: [facility];  Service: Endoscopy;  Laterality: N/A;   HAND TENDON SURGERY Left ~ 1978 X2-1980's X3   had 5 ORs after laceration   HOT HEMOSTASIS  12/21/2011   Procedure: HOT HEMOSTASIS (ARGON PLASMA COAGULATION/BICAP);  Surgeon: Jerrell KYM Sol, MD;  Location: [facility];  Service: Endoscopy;  Laterality: N/A;   INGUINAL HERNIA REPAIR Left 04/15/2013   Procedure: LAPAROSCOPIC REPAIR OF INCARCERATED LEFT INGUINAL HERNIA;  Surgeon: Camellia CHRISTELLA Blush, MD;  Location: [facility] OR;  Service: General;  Laterality: Left;   INGUINAL HERNIA REPAIR Bilateral 1970's - 1984   INGUINAL HERNIA REPAIR Left 04/15/2013   INSERTION OF  MESH Left 04/15/2013   Procedure: INSERTION OF MESH;  Surgeon: Camellia CHRISTELLA Blush, MD;  Location: [facility] OR;  Service: General;  Laterality: Left;   JOINT REPLACEMENT     right knee   KNEE ARTHROSCOPY Right 1990's   LAPAROSCOPY N/A 04/15/2013   Procedure: LAPAROSCOPY DIAGNOSTIC;  Surgeon: Camellia CHRISTELLA Blush, MD;  Location: [facility] OR;  Service: General;  Laterality: N/A;   LEFT HEART CATH AND CORONARY ANGIOGRAPHY N/A 02/03/2022   Procedure: LEFT HEART CATH AND CORONARY ANGIOGRAPHY;  Surgeon: Verlin Lonni BIRCH, MD;  Location: [facility];  Service: Cardiovascular;  Laterality: N/A;   LUMBAR LAMINECTOMY/DECOMPRESSION MICRODISCECTOMY Right 07/22/2015   Procedure: Right Lumbar Two-Three Microdiscectomy;  Surgeon: Victory Gunnels, MD;  Location: [facility];  Service: Neurosurgery;  Laterality: Right;   LUMBAR LAMINECTOMY/DECOMPRESSION MICRODISCECTOMY Right 09/05/2016   Procedure: Microdiscectomy - right - T12-L1;  Surgeon: Gunnels Victory, MD;  Location: [facility] OR;  Service: Neurosurgery;  Laterality: Right;   NASAL SEPTUM SURGERY Bilateral 2014   removed polyps & infection   POLYPECTOMY   01/10/2011   Procedure: POLYPECTOMY;  Surgeon: Jerrell KYM Sol, MD;  Location: [facility];  Service: Endoscopy;  Laterality: N/A;   POSTERIOR CERVICAL LAMINECTOMY Left 05/24/2020   Procedure: Laminectomy and Foraminotomy - left - Cervical two-Cervical three;  Surgeon: Gunnels Victory, MD;  Location: [facility] OR;  Service: Neurosurgery;  Laterality: Left;   POSTERIOR LUMBAR FUSION  11/1995   ray cages (04/15/2013)   SHOULDER ARTHROSCOPY Bilateral    twice each   TEE WITHOUT CARDIOVERSION N/A 05/19/2022   Procedure: TRANSESOPHAGEAL ECHOCARDIOGRAM;  Surgeon: Lucas Dorise POUR, MD;  Location: [facility] OR;  Service: Open Heart Surgery;  Laterality: N/A;   TONSILLECTOMY  1960's   TOTAL KNEE ARTHROPLASTY Right 02/24/2014   Procedure: TOTAL RIGHT KNEE ARTHROPLASTY;  Surgeon: Donnice BIRCH Car, MD;  Location: [facility]S;  Service: Orthopedics;  Laterality: Right;   TOTAL SHOULDER ARTHROPLASTY Left 09/18/2019   Procedure: TOTAL SHOULDER ARTHROPLASTY;  Surgeon: Melita Drivers, MD;  Location: [facility] ORS;  Service: Orthopedics;  Laterality: Left;    TOTAL SHOULDER ARTHROPLASTY Right 11/04/2020   Procedure: TOTAL SHOULDER ARTHROPLASTY;  Surgeon: Melita Drivers, MD;  Location: [facility]S;  Service: Orthopedics;  Laterality: Right;    TOTAL SHOULDER REVISION Left 11/30/2022   Procedure: Removal Left anatomic shoulder arthroplasty and conversion to Reverse shoulder arthroplasty;  Surgeon: Melita Drivers, MD;  Location: [facility];  Service: Orthopedics;  Laterality: Left;    TOTAL SHOULDER REVISION Right 09/20/2023   Procedure: REVISION, TOTAL ARTHROPLASTY, SHOULDER;  Surgeon: Melita Drivers, MD;  Location: [facility]S;  Service: Orthopedics;  Laterality: Right;  conversion Right Anatomic to reverse arthroplasty   UPPER GI ENDOSCOPY     VASECTOMY  1988   WISDOM TOOTH EXTRACTION  2000's   Social History[1] Social History   Social History Narrative   Not on file     Immunization History  Administered Date(s)  Administered   Fluzone Influenza virus vaccine,trivalent (IIV3), split virus 02/06/2012, 02/14/2013, 12/07/2014, 11/12/2018   Influenza Whole 11/08/2011   Influenza,inj,Quad PF,6+ Mos 03/20/2018   Influenza,inj,quad, With Preservative 11/28/2018   Influenza-Unspecified 11/10/2010, 02/27/2013, 12/10/2020, 12/11/2021, 01/04/2023   PFIZER(Purple Top)SARS-COV-2 Vaccination 06/23/2019, 07/14/2019, 05/14/2020, 09/22/2020   PNEUMOCOCCAL CONJUGATE-20 12/09/2021   Pfizer Covid-19 Vaccine Bivalent Booster 39yrs & up 01/14/2021   Pfizer(Comirnaty)Fall Seasonal Vaccine 12 years and older 12/18/2021   Pneumococcal Conjugate-13 09/28/2020   Pneumococcal Polysaccharide-23 06/12/2011   Tdap 12/06/2010, 12/22/2022   Zoster Recombinant(Shingrix) 02/13/2018   Zoster,  Live 06/28/2010, 02/13/2018, 10/31/2018     Objective: Vital Signs: BP (!) 156/99   Pulse 80   Temp 98.3 F (36.8 C)   Resp 17   Ht 5' 7 (1.702 m)   Wt 220 lb 6.4 oz (100 kg)   BMI 34.52 kg/m    Physical Exam Vitals and nursing note reviewed.  Constitutional:      Appearance: He is well-developed.  HENT:     Head: Normocephalic and atraumatic.  Eyes:     Conjunctiva/sclera: Conjunctivae normal.     Pupils: Pupils are equal, round, and reactive to light.  Cardiovascular:     Rate and Rhythm: Normal rate and regular rhythm.     Heart sounds: Normal heart sounds.  Pulmonary:     Effort: Pulmonary effort is normal.     Breath sounds: Normal breath sounds.  Abdominal:     General: Bowel sounds are normal.     Palpations: Abdomen is soft.  Musculoskeletal:     Cervical back: Normal range of motion and neck supple.  Skin:    General: Skin is warm and dry.     Capillary Refill: Capillary refill takes less than 2 seconds.  Neurological:     Mental Status: He is alert and oriented to person, place, and time.  Psychiatric:        Behavior: Behavior normal.      Musculoskeletal Exam: He had limited lateral rotation of the  cervical spine.  He had limited flexion and extension.  Thoracic and lumbar spine were also limited range of motion.  He had no tenderness over thoracic or lumbar spine.  Shoulder joints were replaced and were in good range of motion.  He had some limitation with internal rotation.  Elbow joints and wrist joints in good range of motion.  Bilateral CMC, PIP and DIP thickening was noted.  No synovitis was noted.  Hip joints were in good range of motion.  He had tenderness over left trochanteric bursa.  Knee joints were replaced and were in good range of motion.  There was no tenderness over ankles.  Hammertoes were noted in the right foot.  CDAI Exam: CDAI Score: -- Patient Global: --; Provider Global: -- Swollen: --; Tender: -- Joint Exam 03/18/2024   No joint exam has been documented for this visit   There is currently no information documented on the homunculus. Go to the Rheumatology activity and complete the homunculus joint exam.  Investigation: No additional findings.  Imaging: MYOCARDIAL PERFUSION/CT RAD READ Result Date: 03/17/2024 CLINICAL DATA:  This over-read does not include interpretation of cardiac or coronary anatomy or pathology. The cardiac SPECT CT interpretation by the cardiologist is attached. COMPARISON:  None Available. FINDINGS: Vascular: Atherosclerotic calcifications of the thoracic aorta are noted. Changes of prior coronary bypass grafting are seen. Mediastinum/Nodes: No hilar or mediastinal adenopathy is noted. Lungs/Pleura: Lungs are well aerated bilaterally. No focal infiltrate is seen. Upper Abdomen: Visualized upper abdomen is within normal limits. Musculoskeletal: No acute bony abnormality noted. Bilateral shoulder replacements are seen. IMPRESSION: Aortic Atherosclerosis (ICD10-I70.0). Electronically Signed   By: Oneil Devonshire M.D.   On: 03/17/2024 00:48   MYOCARDIAL PERFUSION IMAGING Result Date: 03/11/2024   Findings are consistent with no ischemia and no  infarction. The study is low risk.   No ST deviation was noted.   LV perfusion is normal. There is no evidence of ischemia. There is no evidence of infarction.   Left ventricular function is normal. Nuclear stress EF: 59%.  The left ventricular ejection fraction is normal (55-65%). End diastolic cavity size is normal. End systolic cavity size is normal.   CT images were obtained for attenuation correction and were examined for the presence of coronary calcium  when appropriate.   Coronary calcium  was present on the attenuation correction CT images. Severe coronary calcifications were present. Coronary calcifications were present in the left anterior descending artery, left circumflex artery and right coronary artery distribution(s).  Aortic atherosclerosis.    Recent Labs: Lab Results  Component Value Date   WBC 9.2 09/13/2023   HGB 12.5 (L) 09/13/2023   PLT 269 09/13/2023   NA 135 09/13/2023   K 4.8 09/13/2023   CL 101 09/13/2023   CO2 24 09/13/2023   GLUCOSE 112 (H) 09/13/2023   BUN 24 (H) 09/13/2023   CREATININE 1.26 (H) 09/13/2023   BILITOT 0.5 03/14/2023   ALKPHOS 73 08/02/2022   AST 24 03/14/2023   ALT 21 03/14/2023   PROT 7.3 03/14/2023   ALBUMIN  4.4 08/02/2022   CALCIUM  9.7 09/13/2023   GFRAA >60 09/09/2019   February 08, 2024 hemoglobin A1c 6.3, WBC 6.5, hemoglobin 13.0, platelets 248, creatinine 1.11, GFR 72, AST 40, ALT 25  Speciality Comments: No specialty comments available.  Procedures:  No procedures performed Allergies: Tetracyclines & related, Amoxicillin, Crestor  [rosuvastatin ], Doxycycline, and Niacin and related   Assessment / Plan:     Visit Diagnoses: Idiopathic chronic gout of multiple sites without tophus -patient reports having intermittent flares of gout.  He states he had a flare last week with increasing stiffness in his hands.  He takes colchicine  on as needed basis.  He has not reduced the dose of allopurinol  and denies any interruption in the allopurinol   use.  I am uncertain if the pain is coming from osteoarthritis or gout.  Will check uric acid level today.  He was advised to continue current regimen.  Allopurinol  450 mg p.o. daily.  Colchicine  0.6 mg p.o. twice daily as needed.  Medication monitoring encounter - uric acid: 4.4 on 06/25/2023.  Will check uric acid level today.February 08, 2024 hemoglobin A1c 6.3, WBC 6.5, hemoglobin 13.0, platelets 248, creatinine 1.11, GFR 72, AST 40, ALT 25.  Labs were reviewed with the patient.  Elevated AST most likely due to statin use.  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 Melita.  Patient has good range of motion of both shoulders except for some limitation with internal rotation.  Status post shoulder replacement, left, 09/18/19 by Dr. Melita  Primary osteoarthritis of both hands-bilateral PIP and DIP thickening was noted.  CMC prominence was noted.  Joint protection was discussed.  Iliotibial band syndrome of left side-he continues to have some discomfort in the trochanteric region.  IT band stretches were discussed.  I would avoid cortisone injection due to elevated blood pressure.  Primary osteoarthritis of left knee-chronic discomfort.  No warmth swelling or effusion was noted.  History of total right knee replacement (TKR)-he continues to have some pain in the replaced knee.  Primary osteoarthritis of both feet - X-rays of the suggestive of osteoarthritis.  He is concerned about the hammertoes in his right foot.  Metatarsal pads were advised.  I also offered referral to podiatrist but he would like to hold off.  DDD (degenerative disc disease), cervical-he continues to have limited range of motion of the cervical spine and discomfort.  Spondylosis of lumbar region without myelopathy or radiculopathy, s/p fusion by Dr Malcolm 04/23.  He continues to have chronic pain.  Fibromyalgia-he has generalized pain and discomfort from fibromyalgia.  Need for regular exercise and stretching  was emphasized.  History of hypertension--Blood pressure was elevated at 148/102.  Repeat blood pressure was 156/99.  He was advised to monitor blood pressure closely and follow-up with his PCP.  Other medical problems are listed as follows:  S/P CABG x 5, 04/2022.  History of hypercholesterolemia  History of hiatal hernia  History of gastroesophageal reflux (GERD)  History of insomnia  History of sleep apnea  Orders: Orders Placed This Encounter  Procedures   Uric acid   No orders of the defined types were placed in this encounter.   Follow-Up Instructions: Return in about 6 months (around 09/15/2024) for Gout, Osteoarthritis.   Maya Nash, MD  Note - This record has been created using Animal nutritionist.  Chart creation errors have been sought, but may not always  have been located. Such creation errors do not reflect on  the standard of medical care.     [1]  Social History Tobacco Use   Smoking status: Former    Current packs/day: 0.00    Average packs/day: 0.1 packs/day for 15.0 years (1.5 ttl pk-yrs)    Types: Cigarettes, Cigars    Start date: 01/09/1995    Quit date: 01/08/2010    Years since quitting: 14.2    Passive exposure: Past   Smokeless tobacco: Former    Types: Chew   Tobacco comments:    04/15/2013 smoked ~ 1 pack/month  Vaping Use   Vaping status: Never Used  Substance Use Topics   Alcohol  use: Yes    Alcohol /week: 2.0 standard drinks of alcohol     Types: 2 Standard drinks or equivalent per week    Comment: occasional   Drug use: No   "

## 2024-03-05 ENCOUNTER — Other Ambulatory Visit: Payer: Self-pay | Admitting: Cardiovascular Disease

## 2024-03-05 ENCOUNTER — Other Ambulatory Visit (HOSPITAL_COMMUNITY): Payer: Self-pay

## 2024-03-05 DIAGNOSIS — R079 Chest pain, unspecified: Secondary | ICD-10-CM

## 2024-03-06 ENCOUNTER — Telehealth (HOSPITAL_COMMUNITY): Payer: Self-pay | Admitting: *Deleted

## 2024-03-06 NOTE — Telephone Encounter (Signed)
 Left message on voicemail per DPR in reference to upcoming appointment scheduled on 03/11/2024 at 10:30 with detailed instructions given per Myocardial Perfusion Study Information Sheet for the test. LM to arrive 15 minutes early, and that it is imperative to arrive on time for appointment to keep from having the test rescheduled. If you need to cancel or reschedule your appointment, please call the office within 24 hours of your appointment. Failure to do so may result in a cancellation of your appointment, and a $50 no show fee. Phone number given for call back for any questions.

## 2024-03-07 ENCOUNTER — Other Ambulatory Visit (HOSPITAL_COMMUNITY): Payer: Self-pay

## 2024-03-07 MED ORDER — OXYCODONE HCL 10 MG PO TABS: 10.0000 mg | ORAL_TABLET | Freq: Three times a day (TID) | ORAL | 0 refills | Status: AC

## 2024-03-07 MED ORDER — OXYCODONE HCL 10 MG PO TABS
10.0000 mg | ORAL_TABLET | Freq: Three times a day (TID) | ORAL | 0 refills | Status: DC
  Filled 2024-03-24: qty 90, 30d supply, fill #0

## 2024-03-11 ENCOUNTER — Ambulatory Visit (HOSPITAL_COMMUNITY)
Admission: RE | Admit: 2024-03-11 | Discharge: 2024-03-11 | Disposition: A | Discharge status: Home / Self Care | Source: Ambulatory Visit | Attending: Cardiology | Admitting: Cardiology

## 2024-03-11 ENCOUNTER — Ambulatory Visit: Payer: Self-pay | Admitting: Cardiovascular Disease

## 2024-03-11 DIAGNOSIS — R079 Chest pain, unspecified: Secondary | ICD-10-CM | POA: Diagnosis not present

## 2024-03-11 LAB — MYOCARDIAL PERFUSION IMAGING
Base ST Depression (mm): 0 mm
LV dias vol: 90 mL (ref 62–150)
LV sys vol: 37 mL
Nuc Stress EF: 59 %
Peak HR: 78 {beats}/min
Rest HR: 66 {beats}/min
Rest Nuclear Isotope Dose: 10.8 mCi
SDS: 0
SRS: 0
SSS: 0
ST Depression (mm): 0 mm
Stress Nuclear Isotope Dose: 32.9 mCi
TID: 1.02

## 2024-03-11 MED ORDER — REGADENOSON 0.4 MG/5ML IV SOLN
INTRAVENOUS | Status: AC
  Filled 2024-03-11: qty 5

## 2024-03-11 MED ORDER — TECHNETIUM TC 99M TETROFOSMIN IV KIT
32.9000 | PACK | Freq: Once | INTRAVENOUS | Status: AC | PRN
  Administered 2024-03-11: 32.9 via INTRAVENOUS

## 2024-03-11 MED ORDER — REGADENOSON 0.4 MG/5ML IV SOLN
0.4000 mg | Freq: Once | INTRAVENOUS | Status: AC
  Administered 2024-03-11: 0.4 mg via INTRAVENOUS

## 2024-03-11 MED ORDER — TECHNETIUM TC 99M TETROFOSMIN IV KIT
10.8000 | PACK | Freq: Once | INTRAVENOUS | Status: AC | PRN
  Administered 2024-03-11: 10.8 via INTRAVENOUS

## 2024-03-18 ENCOUNTER — Ambulatory Visit: Attending: Rheumatology | Admitting: Rheumatology

## 2024-03-18 ENCOUNTER — Encounter: Payer: Self-pay | Admitting: Rheumatology

## 2024-03-18 VITALS — BP 156/99 | HR 80 | Temp 98.3°F | Resp 17 | Ht 67.0 in | Wt 220.4 lb

## 2024-03-18 DIAGNOSIS — M797 Fibromyalgia: Secondary | ICD-10-CM | POA: Diagnosis not present

## 2024-03-18 DIAGNOSIS — Z96651 Presence of right artificial knee joint: Secondary | ICD-10-CM

## 2024-03-18 DIAGNOSIS — Z96611 Presence of right artificial shoulder joint: Secondary | ICD-10-CM | POA: Diagnosis not present

## 2024-03-18 DIAGNOSIS — Z951 Presence of aortocoronary bypass graft: Secondary | ICD-10-CM

## 2024-03-18 DIAGNOSIS — M19071 Primary osteoarthritis, right ankle and foot: Secondary | ICD-10-CM

## 2024-03-18 DIAGNOSIS — M1712 Unilateral primary osteoarthritis, left knee: Secondary | ICD-10-CM | POA: Diagnosis not present

## 2024-03-18 DIAGNOSIS — M1A09X0 Idiopathic chronic gout, multiple sites, without tophus (tophi): Secondary | ICD-10-CM

## 2024-03-18 DIAGNOSIS — Z8719 Personal history of other diseases of the digestive system: Secondary | ICD-10-CM

## 2024-03-18 DIAGNOSIS — M5030 Other cervical disc degeneration, unspecified cervical region: Secondary | ICD-10-CM

## 2024-03-18 DIAGNOSIS — M7632 Iliotibial band syndrome, left leg: Secondary | ICD-10-CM | POA: Diagnosis not present

## 2024-03-18 DIAGNOSIS — M47816 Spondylosis without myelopathy or radiculopathy, lumbar region: Secondary | ICD-10-CM

## 2024-03-18 DIAGNOSIS — Z87898 Personal history of other specified conditions: Secondary | ICD-10-CM

## 2024-03-18 DIAGNOSIS — M19041 Primary osteoarthritis, right hand: Secondary | ICD-10-CM | POA: Diagnosis not present

## 2024-03-18 DIAGNOSIS — Z5181 Encounter for therapeutic drug level monitoring: Secondary | ICD-10-CM | POA: Diagnosis not present

## 2024-03-18 DIAGNOSIS — Z8679 Personal history of other diseases of the circulatory system: Secondary | ICD-10-CM

## 2024-03-18 DIAGNOSIS — M19072 Primary osteoarthritis, left ankle and foot: Secondary | ICD-10-CM

## 2024-03-18 DIAGNOSIS — Z96612 Presence of left artificial shoulder joint: Secondary | ICD-10-CM

## 2024-03-18 DIAGNOSIS — Z8669 Personal history of other diseases of the nervous system and sense organs: Secondary | ICD-10-CM

## 2024-03-18 DIAGNOSIS — M19042 Primary osteoarthritis, left hand: Secondary | ICD-10-CM

## 2024-03-18 DIAGNOSIS — Z8639 Personal history of other endocrine, nutritional and metabolic disease: Secondary | ICD-10-CM

## 2024-03-19 ENCOUNTER — Ambulatory Visit: Payer: Self-pay | Admitting: Rheumatology

## 2024-03-19 LAB — URIC ACID: Uric Acid, Serum: 4.5 mg/dL (ref 4.0–8.0)

## 2024-03-19 NOTE — Progress Notes (Signed)
Uric acid is in the desirable range.

## 2024-03-20 ENCOUNTER — Encounter (HOSPITAL_COMMUNITY): Payer: Self-pay

## 2024-03-20 ENCOUNTER — Other Ambulatory Visit (HOSPITAL_COMMUNITY): Payer: Self-pay

## 2024-03-20 MED ORDER — OXYCODONE HCL 10 MG PO TABS
10.0000 mg | ORAL_TABLET | Freq: Three times a day (TID) | ORAL | 0 refills | Status: AC
  Filled 2024-03-21: qty 90, 30d supply, fill #0

## 2024-03-21 ENCOUNTER — Other Ambulatory Visit (HOSPITAL_COMMUNITY): Payer: Self-pay

## 2024-03-24 ENCOUNTER — Other Ambulatory Visit (HOSPITAL_COMMUNITY): Payer: Self-pay

## 2024-09-15 ENCOUNTER — Ambulatory Visit: Admitting: Rheumatology
